# Patient Record
Sex: Male | Born: 1945 | Race: White | Hispanic: No | Marital: Married | Smoking: Never smoker
Health system: Southern US, Community
[De-identification: ages and names within clinical notes are randomized; demographics above are authoritative.]

## PROBLEM LIST (undated history)

## (undated) DIAGNOSIS — E039 Hypothyroidism, unspecified: Secondary | ICD-10-CM

## (undated) DIAGNOSIS — R7303 Prediabetes: Secondary | ICD-10-CM

## (undated) DIAGNOSIS — M549 Dorsalgia, unspecified: Secondary | ICD-10-CM

## (undated) DIAGNOSIS — K5790 Diverticulosis of intestine, part unspecified, without perforation or abscess without bleeding: Secondary | ICD-10-CM

## (undated) DIAGNOSIS — I10 Essential (primary) hypertension: Secondary | ICD-10-CM

## (undated) DIAGNOSIS — I2510 Atherosclerotic heart disease of native coronary artery without angina pectoris: Secondary | ICD-10-CM

## (undated) DIAGNOSIS — I499 Cardiac arrhythmia, unspecified: Secondary | ICD-10-CM

## (undated) DIAGNOSIS — E785 Hyperlipidemia, unspecified: Secondary | ICD-10-CM

## (undated) DIAGNOSIS — Z860100 Personal history of colon polyps, unspecified: Secondary | ICD-10-CM

## (undated) DIAGNOSIS — I77810 Thoracic aortic ectasia: Secondary | ICD-10-CM

## (undated) DIAGNOSIS — M1990 Unspecified osteoarthritis, unspecified site: Secondary | ICD-10-CM

## (undated) DIAGNOSIS — I350 Nonrheumatic aortic (valve) stenosis: Secondary | ICD-10-CM

## (undated) DIAGNOSIS — E079 Disorder of thyroid, unspecified: Secondary | ICD-10-CM

## (undated) DIAGNOSIS — R0600 Dyspnea, unspecified: Secondary | ICD-10-CM

## (undated) DIAGNOSIS — R011 Cardiac murmur, unspecified: Secondary | ICD-10-CM

## (undated) HISTORY — PX: COLONOSCOPY: SHX174

## (undated) HISTORY — DX: Diverticulosis of intestine, part unspecified, without perforation or abscess without bleeding: K57.90

## (undated) HISTORY — DX: Personal history of colonic polyps: Z86.010

## (undated) HISTORY — DX: Nonrheumatic aortic (valve) stenosis: I35.0

## (undated) HISTORY — PX: WISDOM TOOTH EXTRACTION: SHX21

## (undated) HISTORY — DX: Dorsalgia, unspecified: M54.9

## (undated) HISTORY — DX: Disorder of thyroid, unspecified: E07.9

## (undated) HISTORY — PX: LASIK: SHX215

## (undated) HISTORY — DX: Hyperlipidemia, unspecified: E78.5

## (undated) HISTORY — DX: Thoracic aortic ectasia: I77.810

## (undated) HISTORY — DX: Personal history of colon polyps, unspecified: Z86.0100

## (undated) HISTORY — DX: Cardiac arrhythmia, unspecified: I49.9

---

## 1999-01-17 ENCOUNTER — Encounter: Admission: RE | Admit: 1999-01-17 | Discharge: 1999-02-03 | Payer: Self-pay | Admitting: Family Medicine

## 2002-11-25 LAB — FECAL OCCULT BLOOD, GUAIAC: Fecal Occult Blood: NEGATIVE

## 2003-09-28 ENCOUNTER — Encounter: Admission: RE | Admit: 2003-09-28 | Discharge: 2003-09-28 | Payer: Self-pay | Admitting: Family Medicine

## 2005-03-26 ENCOUNTER — Ambulatory Visit: Payer: Self-pay | Admitting: Internal Medicine

## 2005-03-26 ENCOUNTER — Emergency Department (HOSPITAL_COMMUNITY): Admission: EM | Admit: 2005-03-26 | Discharge: 2005-03-26 | Payer: Self-pay | Admitting: Emergency Medicine

## 2005-03-28 ENCOUNTER — Ambulatory Visit: Payer: Self-pay | Admitting: Family Medicine

## 2005-04-04 ENCOUNTER — Ambulatory Visit: Payer: Self-pay | Admitting: Family Medicine

## 2005-04-09 ENCOUNTER — Ambulatory Visit: Payer: Self-pay | Admitting: Family Medicine

## 2005-04-11 ENCOUNTER — Ambulatory Visit: Payer: Self-pay | Admitting: Family Medicine

## 2005-04-16 ENCOUNTER — Ambulatory Visit: Payer: Self-pay | Admitting: Family Medicine

## 2005-11-22 ENCOUNTER — Ambulatory Visit: Payer: Self-pay | Admitting: Family Medicine

## 2005-11-23 ENCOUNTER — Ambulatory Visit: Payer: Self-pay | Admitting: Family Medicine

## 2005-11-23 LAB — CONVERTED CEMR LAB: PSA: 0.49 ng/mL

## 2005-12-19 ENCOUNTER — Ambulatory Visit: Payer: Self-pay | Admitting: Gastroenterology

## 2005-12-26 ENCOUNTER — Ambulatory Visit: Payer: Self-pay | Admitting: Gastroenterology

## 2005-12-26 LAB — HM COLONOSCOPY

## 2006-07-24 ENCOUNTER — Ambulatory Visit: Payer: Self-pay | Admitting: Family Medicine

## 2006-09-03 ENCOUNTER — Encounter: Admission: RE | Admit: 2006-09-03 | Discharge: 2006-09-03 | Payer: Self-pay | Admitting: Family Medicine

## 2006-10-02 ENCOUNTER — Ambulatory Visit: Payer: Self-pay | Admitting: Family Medicine

## 2007-01-07 ENCOUNTER — Ambulatory Visit: Payer: Self-pay | Admitting: Family Medicine

## 2007-01-07 LAB — CONVERTED CEMR LAB
ALT: 44 units/L — ABNORMAL HIGH (ref 0–40)
AST: 37 units/L (ref 0–37)
Cholesterol: 207 mg/dL (ref 0–200)
Direct LDL: 140 mg/dL
HDL: 49.7 mg/dL (ref >39.0)
Total CHOL/HDL Ratio: 4.2
Triglycerides: 91 mg/dL (ref 0–149)
VLDL: 18 mg/dL (ref 0–40)

## 2007-03-11 ENCOUNTER — Telehealth (INDEPENDENT_AMBULATORY_CARE_PROVIDER_SITE_OTHER): Payer: Self-pay | Admitting: *Deleted

## 2007-11-24 ENCOUNTER — Encounter (INDEPENDENT_AMBULATORY_CARE_PROVIDER_SITE_OTHER): Payer: Self-pay | Admitting: *Deleted

## 2008-01-07 ENCOUNTER — Ambulatory Visit: Payer: Self-pay | Admitting: Family Medicine

## 2008-01-07 DIAGNOSIS — R7303 Prediabetes: Secondary | ICD-10-CM | POA: Insufficient documentation

## 2008-01-13 ENCOUNTER — Encounter: Payer: Self-pay | Admitting: Family Medicine

## 2008-01-22 ENCOUNTER — Ambulatory Visit: Payer: Self-pay | Admitting: Family Medicine

## 2008-01-28 LAB — CONVERTED CEMR LAB
ALT: 31 units/L (ref 0–53)
AST: 31 units/L (ref 0–37)
Cholesterol: 249 mg/dL (ref 0–200)
Direct LDL: 192.1 mg/dL
HDL: 53.4 mg/dL (ref >39.0)
Total CHOL/HDL Ratio: 4.7
Triglycerides: 63 mg/dL (ref 0–149)
VLDL: 13 mg/dL (ref 0–40)

## 2008-03-31 ENCOUNTER — Ambulatory Visit: Payer: Self-pay | Admitting: Family Medicine

## 2008-04-02 LAB — CONVERTED CEMR LAB
ALT: 32 units/L (ref 0–53)
AST: 30 units/L (ref 0–37)
Cholesterol: 226 mg/dL (ref 0–200)
Direct LDL: 163.7 mg/dL
HDL: 46.5 mg/dL (ref >39.0)
Total CHOL/HDL Ratio: 4.9
Triglycerides: 78 mg/dL (ref 0–149)
VLDL: 16 mg/dL (ref 0–40)

## 2008-09-09 ENCOUNTER — Telehealth: Payer: Self-pay | Admitting: Family Medicine

## 2008-11-17 ENCOUNTER — Ambulatory Visit: Payer: Self-pay | Admitting: Family Medicine

## 2008-11-18 LAB — CONVERTED CEMR LAB
ALT: 48 units/L (ref 0–53)
AST: 45 units/L — ABNORMAL HIGH (ref 0–37)
Albumin: 4.5 g/dL (ref 3.5–5.2)
Alkaline Phosphatase: 56 units/L (ref 39–117)
BUN: 12 mg/dL (ref 6–23)
Basophils Absolute: 0 10*3/uL (ref 0.0–0.1)
Basophils Relative: 0.9 % (ref 0.0–3.0)
Bilirubin, Direct: 0.1 mg/dL (ref 0.0–0.3)
CO2: 30 meq/L (ref 19–32)
Calcium: 9.3 mg/dL (ref 8.4–10.5)
Chloride: 108 meq/L (ref 96–112)
Cholesterol: 231 mg/dL (ref 0–200)
Creatinine, Ser: 0.9 mg/dL (ref 0.4–1.5)
Direct LDL: 162.4 mg/dL
Eosinophils Absolute: 0.2 10*3/uL (ref 0.0–0.7)
Eosinophils Relative: 4.5 % (ref 0.0–5.0)
GFR calc Af Amer: 111 mL/min
GFR calc non Af Amer: 91 mL/min
Glucose, Bld: 108 mg/dL — ABNORMAL HIGH (ref 70–99)
HCT: 42 % (ref 39.0–52.0)
HDL: 61.2 mg/dL (ref >39.0)
Hemoglobin: 14.7 g/dL (ref 13.0–17.0)
Lymphocytes Relative: 39 % (ref 12.0–46.0)
MCHC: 34.9 g/dL (ref 30.0–36.0)
MCV: 93.3 fL (ref 78.0–100.0)
Monocytes Absolute: 0.7 10*3/uL (ref 0.1–1.0)
Monocytes Relative: 15 % — ABNORMAL HIGH (ref 3.0–12.0)
Neutro Abs: 2.1 10*3/uL (ref 1.4–7.7)
Neutrophils Relative %: 40.6 % — ABNORMAL LOW (ref 43.0–77.0)
PSA: 0.56 ng/mL (ref 0.10–4.00)
Platelets: 131 10*3/uL — ABNORMAL LOW (ref 150–400)
Potassium: 4.5 meq/L (ref 3.5–5.1)
RBC: 4.5 M/uL (ref 4.22–5.81)
RDW: 12.4 % (ref 11.5–14.6)
Sodium: 141 meq/L (ref 135–145)
TSH: 2.72 microintl units/mL (ref 0.35–5.50)
Total Bilirubin: 1.3 mg/dL — ABNORMAL HIGH (ref 0.3–1.2)
Total CHOL/HDL Ratio: 3.8
Total Protein: 7.4 g/dL (ref 6.0–8.3)
Triglycerides: 66 mg/dL (ref 0–149)
VLDL: 13 mg/dL (ref 0–40)
WBC: 4.9 10*3/uL (ref 4.5–10.5)

## 2009-02-15 ENCOUNTER — Ambulatory Visit: Payer: Self-pay | Admitting: Family Medicine

## 2009-02-15 DIAGNOSIS — D696 Thrombocytopenia, unspecified: Secondary | ICD-10-CM | POA: Insufficient documentation

## 2009-02-16 ENCOUNTER — Telehealth: Payer: Self-pay | Admitting: Family Medicine

## 2009-03-24 ENCOUNTER — Encounter (INDEPENDENT_AMBULATORY_CARE_PROVIDER_SITE_OTHER): Payer: Self-pay | Admitting: *Deleted

## 2009-06-30 ENCOUNTER — Telehealth (INDEPENDENT_AMBULATORY_CARE_PROVIDER_SITE_OTHER): Payer: Self-pay | Admitting: *Deleted

## 2009-08-16 ENCOUNTER — Ambulatory Visit: Payer: Self-pay | Admitting: Family Medicine

## 2009-08-16 DIAGNOSIS — I10 Essential (primary) hypertension: Secondary | ICD-10-CM | POA: Insufficient documentation

## 2009-08-17 LAB — CONVERTED CEMR LAB
ALT: 22 units/L (ref 0–53)
AST: 22 units/L (ref 0–37)
Basophils Absolute: 0 10*3/uL (ref 0.0–0.1)
Basophils Relative: 0.6 % (ref 0.0–3.0)
Cholesterol: 256 mg/dL — ABNORMAL HIGH (ref 0–200)
Direct LDL: 194.9 mg/dL
Eosinophils Absolute: 0.2 10*3/uL (ref 0.0–0.7)
Eosinophils Relative: 4.5 % (ref 0.0–5.0)
Glucose, Bld: 115 mg/dL — ABNORMAL HIGH (ref 70–99)
HCT: 42.3 % (ref 39.0–52.0)
HDL: 51.2 mg/dL (ref >39.00)
Hemoglobin: 14.3 g/dL (ref 13.0–17.0)
Lymphocytes Relative: 40.5 % (ref 12.0–46.0)
Lymphs Abs: 2 10*3/uL (ref 0.7–4.0)
MCHC: 33.9 g/dL (ref 30.0–36.0)
MCV: 93.1 fL (ref 78.0–100.0)
Monocytes Absolute: 0.4 10*3/uL (ref 0.1–1.0)
Monocytes Relative: 8.9 % (ref 3.0–12.0)
Neutro Abs: 2.3 10*3/uL (ref 1.4–7.7)
Neutrophils Relative %: 45.5 % (ref 43.0–77.0)
Platelets: 136 10*3/uL — ABNORMAL LOW (ref 150.0–400.0)
RBC: 4.54 M/uL (ref 4.22–5.81)
RDW: 12.2 % (ref 11.5–14.6)
Total CHOL/HDL Ratio: 5
Triglycerides: 94 mg/dL (ref 0.0–149.0)
VLDL: 18.8 mg/dL (ref 0.0–40.0)
WBC: 4.9 10*3/uL (ref 4.5–10.5)

## 2009-08-19 ENCOUNTER — Ambulatory Visit: Payer: Self-pay | Admitting: Family Medicine

## 2009-10-03 ENCOUNTER — Ambulatory Visit: Payer: Self-pay | Admitting: Family Medicine

## 2009-10-06 LAB — CONVERTED CEMR LAB
ALT: 35 units/L (ref 0–53)
AST: 29 units/L (ref 0–37)
Cholesterol: 213 mg/dL — ABNORMAL HIGH (ref 0–200)
Direct LDL: 144.9 mg/dL
HDL: 55.6 mg/dL (ref >39.00)
Total CHOL/HDL Ratio: 4
Triglycerides: 87 mg/dL (ref 0.0–149.0)
VLDL: 17.4 mg/dL (ref 0.0–40.0)

## 2009-10-10 ENCOUNTER — Ambulatory Visit: Payer: Self-pay | Admitting: Family Medicine

## 2009-10-10 DIAGNOSIS — IMO0002 Reserved for concepts with insufficient information to code with codable children: Secondary | ICD-10-CM | POA: Insufficient documentation

## 2009-11-10 ENCOUNTER — Ambulatory Visit: Payer: Self-pay | Admitting: Family Medicine

## 2009-12-09 ENCOUNTER — Ambulatory Visit: Payer: Self-pay | Admitting: Family Medicine

## 2009-12-09 DIAGNOSIS — H9319 Tinnitus, unspecified ear: Secondary | ICD-10-CM | POA: Insufficient documentation

## 2009-12-13 LAB — CONVERTED CEMR LAB
ALT: 31 units/L (ref 0–53)
AST: 27 units/L (ref 0–37)
Cholesterol: 222 mg/dL — ABNORMAL HIGH (ref 0–200)
Direct LDL: 153.8 mg/dL
HDL: 58.3 mg/dL (ref >39.00)
Total CHOL/HDL Ratio: 4
Triglycerides: 75 mg/dL (ref 0.0–149.0)
VLDL: 15 mg/dL (ref 0.0–40.0)

## 2010-01-03 ENCOUNTER — Telehealth: Payer: Self-pay | Admitting: Family Medicine

## 2010-01-12 ENCOUNTER — Encounter: Payer: Self-pay | Admitting: Family Medicine

## 2010-03-30 ENCOUNTER — Encounter (INDEPENDENT_AMBULATORY_CARE_PROVIDER_SITE_OTHER): Payer: Self-pay | Admitting: *Deleted

## 2010-04-05 ENCOUNTER — Ambulatory Visit: Payer: Self-pay | Admitting: Family Medicine

## 2010-04-05 LAB — CONVERTED CEMR LAB
ALT: 27 units/L (ref 0–53)
AST: 25 units/L (ref 0–37)
Albumin: 4.4 g/dL (ref 3.5–5.2)
BUN: 19 mg/dL (ref 6–23)
CO2: 26 meq/L (ref 19–32)
Calcium: 9 mg/dL (ref 8.4–10.5)
Chloride: 107 meq/L (ref 96–112)
Cholesterol: 203 mg/dL — ABNORMAL HIGH (ref 0–200)
Creatinine, Ser: 0.8 mg/dL (ref 0.4–1.5)
Direct LDL: 139.4 mg/dL
GFR calc non Af Amer: 102.75 mL/min (ref >60)
Glucose, Bld: 107 mg/dL — ABNORMAL HIGH (ref 70–99)
HDL: 59.1 mg/dL (ref >39.00)
Phosphorus: 3.2 mg/dL (ref 2.3–4.6)
Potassium: 4.3 meq/L (ref 3.5–5.1)
Sodium: 141 meq/L (ref 135–145)
Total CHOL/HDL Ratio: 3
Triglycerides: 59 mg/dL (ref 0.0–149.0)
VLDL: 11.8 mg/dL (ref 0.0–40.0)

## 2010-04-12 ENCOUNTER — Ambulatory Visit: Payer: Self-pay | Admitting: Family Medicine

## 2010-07-21 ENCOUNTER — Ambulatory Visit: Payer: Self-pay | Admitting: Family Medicine

## 2010-07-21 DIAGNOSIS — R7309 Other abnormal glucose: Secondary | ICD-10-CM | POA: Insufficient documentation

## 2010-07-28 LAB — CONVERTED CEMR LAB
ALT: 28 units/L (ref 0–53)
AST: 24 units/L (ref 0–37)
Cholesterol: 200 mg/dL (ref 0–200)
HDL: 46.3 mg/dL (ref >39.00)
Hgb A1c MFr Bld: 5.1 % (ref 4.6–6.5)
LDL Cholesterol: 143 mg/dL — ABNORMAL HIGH (ref 0–99)
Total CHOL/HDL Ratio: 4
Triglycerides: 55 mg/dL (ref 0.0–149.0)
VLDL: 11 mg/dL (ref 0.0–40.0)

## 2010-11-28 NOTE — Letter (Signed)
Summary: [name] [facility]  [address] [place], [place] [address]   Phone: [phone]  Fax: [phone]    03/30/2010 MRN: [id number]  [name] [address] [place], [place]  [address]   Dear [name],   Our records indicate that you missed your scheduled appointment with ______Lab_______________ on ____6/1/2011________.  Please contact this office to reschedule your appointment as soon as possible.  It is important that you keep your scheduled appointments with your physician, so we can provide you the best care possible.  Please be advised that there may be a charge for "no show" appointments.    Sincerely,   [place] at [facility]

## 2010-11-28 NOTE — Assessment & Plan Note (Signed)
Summary: 3 WK F/U DLO   Vital Signs:  Patient profile:   65 year old male Height:      68.75 inches Weight:      206.2 pounds BMI:     30.78 Temp:     99.1 degrees F oral Pulse rate:   96 / minute Pulse rhythm:   regular BP sitting:   122 / 82  (left arm) Cuff size:   large  Vitals Entered By: Benny Lennert CMA Duncan Dull) (November 10, 2009 3:26 PM)  History of Present Illness: Chief complaint 2 wk follow up  66 year old male f/u L sided lumbar radiculopathy:  lumbar radiculopathy is essentially resolved. Does have some minimal numbness in the lateral aspect of his lower chimney but no radicular pain.  Aching all over, just getting over a cold. Now havng some aching, but that started. Not coughing really but was last wek. he has a runny nose. No nausea, vomiting, diarrhea. No rashes. He is eating and drinking normally.  Also having some ringing in his ears - has not has ringing in his ears before to much degree.   Takes some glucosamine and some benadryl at night.  review of systems above.  GEN: WDWN, NAD; alert,appropriate and cooperative throughout exam HEENT: Normocephalic and atraumatic. Throat clear, w/o exudate, no LAD, R TM clear, L TM - good landmarks, No fluid present. rhinnorhea. cerumen R. Left frontal and maxillary sinuses: NT Right frontal and maxillary sinuses: NT NECK: No ant or post LAD CV: RRR, No M/G/R PULM: no resp distress, no accessory muscles.  No retractions. no w/c/r ABD: S,NT,ND,+BS, No HSM EXTR: no c/c/e PSYCH: full affect, pleasant, conversant    Allergies: 1)  ! * Vytorin   Impression & Recommendations:  Problem # 1:  BACK PAIN, LUMBAR, WITH RADICULOPATHY (ICD-724.4) result been doing better. Follow up only as needed. Suspect probable discogenic origin.  The following medications were removed from the medication list:    Tramadol Hcl 50 Mg Tabs (Tramadol hcl) .Marland Kitchen... 1 by mouth 4 times daily as needed pain  Problem # 2:  VIRAL  INFECTION-UNSPEC (ICD-079.99) supportive care consistent with viral syndrome.  Complete Medication List: 1)  Caduet 5-20 Mg Tabs (Amlodipine-atorvastatin) .Marland Kitchen.. 1 by mouth once daily  Current Allergies (reviewed today): ! * VYTORIN

## 2010-11-28 NOTE — Progress Notes (Signed)
Summary: ears ringing  Phone Note Call from Patient   Caller: Patient Call For: Judith Part MD Summary of Call: Patient says that he is still having trouble with the ringing in his ears even after using the flonase. Patient is requesting referral to ENT.  Initial call taken by: Melody Comas,  January 03, 2010 3:48 PM  Follow-up for Phone Call        will go ahead and send ref to [facility]- thanks Follow-up by: Judith Part MD,  January 03, 2010 4:40 PM  Additional Follow-up for Phone Call Additional follow up Details #1::        Appt made with Dr Jenne Pane on 03/17?2011 at 3:00pm. Additional Follow-up by: Carlton Adam,  January 03, 2010 5:00 PM

## 2010-11-28 NOTE — Consult Note (Signed)
Summary: [facility]  [facility]   Imported By: Maryln Gottron 01/17/2010 13:25:58  _____________________________________________________________________  External Attachment:    Type:   Image     Comment:   External Document
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
## 2010-11-30 ENCOUNTER — Encounter: Payer: Self-pay | Admitting: Family Medicine

## 2010-11-30 ENCOUNTER — Ambulatory Visit (INDEPENDENT_AMBULATORY_CARE_PROVIDER_SITE_OTHER): Payer: Commercial Managed Care - PPO | Admitting: Family Medicine

## 2010-11-30 DIAGNOSIS — S0500XA Injury of conjunctiva and corneal abrasion without foreign body, unspecified eye, initial encounter: Secondary | ICD-10-CM | POA: Insufficient documentation
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
## 2011-02-13 ENCOUNTER — Encounter: Payer: Self-pay | Admitting: Family Medicine

## 2011-02-14 ENCOUNTER — Encounter: Payer: Self-pay | Admitting: Family Medicine

## 2011-02-14 ENCOUNTER — Ambulatory Visit (INDEPENDENT_AMBULATORY_CARE_PROVIDER_SITE_OTHER): Payer: Commercial Managed Care - PPO | Admitting: Family Medicine

## 2011-02-14 VITALS — BP 130/74 | HR 80 | Temp 98.6°F | Ht 69.5 in | Wt 203.0 lb

## 2011-02-14 DIAGNOSIS — M1710 Unilateral primary osteoarthritis, unspecified knee: Secondary | ICD-10-CM

## 2011-02-14 DIAGNOSIS — IMO0002 Reserved for concepts with insufficient information to code with codable children: Secondary | ICD-10-CM

## 2011-02-14 NOTE — Patient Instructions (Signed)
OSTEOARTHRITIS:  For symptomatic relief: Tylenol: 2 tablets up to 3-4 times a day Regular NSAIDS are helpful (avoid in kidney disease and ulcers) Topical Capzaicin Cream, as needed (wear glove to put on)  For flares, corticosteroid injections help. Hyaluronic Acid injections have good success, average relief is 6 months  Glucosamine and Chondroitin often helpful - will take about 3 months to see if you have an effect. If you do, great, keep them up, if none at that point, no need to take in the future.  Omega-3 fish oils may help, 2 grams daily Ice joints on bad days, 20 min, 2-3 x / day REGULAR EXERCISE: swimming, Yoga, Tai Chi, bicycle (NON-IMPACT activity)  

## 2011-02-14 NOTE — Progress Notes (Signed)
65 year old male:  No incident Back flared up for a couple of days a few weeks ago.   Now, mostly has gone away.   L knee.  Out of the blue, last Thursday.   Patient presents with 6 day h/o L sided knee pain after no occult injury. No audible pop was heard. The patient has had an effusion. No symptomatic giving-way. No mechanical clicking. Joint has not locked up. Patient has been able to walk but is limping. The patient does not have pain going up and down stairs or rising from a seated position.   Pain location: diffuse Current physical activity: hiking, walking Prior Knee Surgery: none Current pain meds: Alleve Bracing: none  The PMH, PSH, Social History, Family History, Medications, and allergies have been reviewed in [facility], and have been updated if relevant.  GEN: Well-developed,well-nourished,in no acute distress; alert,appropriate and cooperative throughout examination HEENT: Normocephalic and atraumatic without obvious abnormalities. Ears, externally no deformities PULM: Breathing comfortably in no respiratory distress EXT: No clubbing, cyanosis, or edema PSYCH: Normally interactive. Cooperative during the interview. Pleasant. Friendly and conversant. Not anxious or depressed appearing. Normal, full affect.  Gait: Normal heel toe pattern ROM: WNL Effusion: neg Echymosis or edema: none Patellar tendon NT Painful PLICA: neg Patellar grind: negative Medial and lateral patellar facet loading: negative medial and lateral joint lines:NT Mcmurray's neg Flexion-pinch neg Varus and valgus stress: stable Lachman: neg Ant and Post drawer: neg Hip abduction, IR, ER: WNL Hip flexion str: 5/5 Hip abd: 5/5 Quad: 5/5 VMO atrophy:No Hamstring concentric and eccentric: 5/5  A/P: Knee Pain OA mild flare, resolving Refer to the patient instructions sections for details of plan shared with patient.

## 2011-03-16 NOTE — Consult Note (Signed)
[name], [name]            ACCOUNT NO.:  [id number]   MEDICAL RECORD NO.:  [id number]          PATIENT TYPE:  EMS   LOCATION:  [facility]                         FACILITY:  [facility]   PHYSICIAN:  Rosalyn Gess. Norins, M.D. [facility] OF BIRTH:  [birth year]   DATE OF CONSULTATION:  03/26/2005  DATE OF DISCHARGE:                                   CONSULTATION   REASON FOR CONSULTATION:  I was asked by Dr. Read Drivers to see and evaluate Mr.  Jaquith for a cellulitis in his right lower extremity.   HISTORY OF PRESENT ILLNESS:  The patient reports being hit on the shin by a  baseball about 1 month ago.  Over the past week or so, he has developed  redness and warmth to the distal lower extremity with some discomfort  running from his ankle to just below the knee.  He was seen at urgent care  and was started on doxycycline several days ago.  For lack of resolution or  improvement, he presents to the emergency department for evaluation.   PAST MEDICAL HISTORY:   SURGICAL:  None.   MEDICAL:  1.  Usual childhood diseases, except mumps.  2.  He has back problems, but has never been hospitalized, never had      surgery.  3.  He is followed for hyperlipidemia.  4.  The patient had an episode of cellulitis in the same leg about 4 years      ago.   CURRENT MEDICATIONS:  __________ 10 mg daily.  No other medications.   HABITS:  Tobacco - none.  Alcohol - 1-2 ounces daily.   ALLERGIES:  The patient has no known drug allergies.   PHYSICAL EXAMINATION:  VITAL SIGNS:  Temperature 99.3, blood pressure  161/88, pulse 94, respirations 22.  GENERAL:  This is a well-nourished, well-developed, white male in no acute  distress.  EXTREMITIES:  The right lower extremity was examined.  He was found to have  a diffuse erythema from the ankle to one inch below the knee with a raised  area about 4 cm in diameter across the chin.  The area is warm to the touch.  There is no linear streaking.  There is no  lymphadenopathy in the popliteal  or inguinal regions.  Careful examination between the toes revealed no break  in the skin or site or portal of entry.   LABORATORY DATA:  Hemoglobin 13.1 gm.  White count was 8600 with 62% segs,  22% lymphs, 13% monos.  Platelet count 140,000.  Chemistries were  unremarkable with a potassium of 4.7, sugar of 91, creatinine 1.3.   ASSESSMENT AND PLAN:  Cellulitis.  Question about portal of entry, but the  patient definitely has a moderate cellulitis with no lymphadenopathy or  fever, and a normal white count.  I believe the patient is stable for  outpatient therapy.   PLAN:  1.  Clindamycin 300 mg q.i.d.  2.  The patient is to elevate his leg and to apply a warm compress.  3.  He is to watch for progression, including streaking, fever,  lymphadenopathy, or generalized sense of illness.  Should this develop,      he is instructed to call the [facility] office for an urgent visit,      or, if after hours, to call the on-call physician to report that he is      to be directly admitted.  4.  The patient is to see Dr. __________ for follow up.   This has been discussed with Dr. Read Drivers and with the patient.      MEN/MEDQ  D:  03/26/2005  T:  03/26/2005  Job:  [id number]

## 2011-05-10 ENCOUNTER — Other Ambulatory Visit: Payer: Self-pay | Admitting: *Deleted

## 2011-05-10 MED ORDER — AMLODIPINE-ATORVASTATIN 5-40 MG PO TABS: 1.0000 | ORAL_TABLET | Freq: Every day | ORAL | Status: DC

## 2011-05-15 ENCOUNTER — Other Ambulatory Visit: Payer: Self-pay | Admitting: Family Medicine

## 2011-09-24 ENCOUNTER — Telehealth: Payer: Self-pay | Admitting: Family Medicine

## 2011-09-24 DIAGNOSIS — E785 Hyperlipidemia, unspecified: Secondary | ICD-10-CM

## 2011-09-24 DIAGNOSIS — Z0000 Encounter for general adult medical examination without abnormal findings: Secondary | ICD-10-CM | POA: Insufficient documentation

## 2011-09-24 DIAGNOSIS — I10 Essential (primary) hypertension: Secondary | ICD-10-CM

## 2011-09-24 DIAGNOSIS — R7309 Other abnormal glucose: Secondary | ICD-10-CM

## 2011-09-24 DIAGNOSIS — D696 Thrombocytopenia, unspecified: Secondary | ICD-10-CM

## 2011-09-24 DIAGNOSIS — Z125 Encounter for screening for malignant neoplasm of prostate: Secondary | ICD-10-CM | POA: Insufficient documentation

## 2011-09-24 NOTE — Telephone Encounter (Signed)
Message copied by Judy Pimple on Mon Sep 24, 2011  6:31 PM ------      Message from: Alvina Chou      Created: Tue Sep 18, 2011  3:35 PM      Regarding: Labs, Tuesday 11.27       Patient is scheduled for CPX labs, please order future labs, Thanks , Camelia Eng

## 2011-09-25 ENCOUNTER — Other Ambulatory Visit: Payer: Commercial Managed Care - PPO

## 2011-10-01 ENCOUNTER — Encounter: Payer: Self-pay | Admitting: Family Medicine

## 2011-10-01 ENCOUNTER — Ambulatory Visit (INDEPENDENT_AMBULATORY_CARE_PROVIDER_SITE_OTHER): Payer: Commercial Managed Care - PPO | Admitting: Family Medicine

## 2011-10-01 VITALS — BP 132/76 | HR 64 | Temp 97.9°F | Ht 68.5 in | Wt 194.2 lb

## 2011-10-01 DIAGNOSIS — I10 Essential (primary) hypertension: Secondary | ICD-10-CM

## 2011-10-01 DIAGNOSIS — D696 Thrombocytopenia, unspecified: Secondary | ICD-10-CM

## 2011-10-01 DIAGNOSIS — R7309 Other abnormal glucose: Secondary | ICD-10-CM

## 2011-10-01 DIAGNOSIS — E785 Hyperlipidemia, unspecified: Secondary | ICD-10-CM

## 2011-10-01 DIAGNOSIS — Z0000 Encounter for general adult medical examination without abnormal findings: Secondary | ICD-10-CM

## 2011-10-01 DIAGNOSIS — Z125 Encounter for screening for malignant neoplasm of prostate: Secondary | ICD-10-CM

## 2011-10-01 LAB — COMPREHENSIVE METABOLIC PANEL
ALT: 31 U/L (ref 0–53)
AST: 26 U/L (ref 0–37)
Albumin: 4.7 g/dL (ref 3.5–5.2)
Alkaline Phosphatase: 57 U/L (ref 39–117)
BUN: 18 mg/dL (ref 6–23)
CO2: 26 mEq/L (ref 19–32)
Calcium: 9.2 mg/dL (ref 8.4–10.5)
Chloride: 103 mEq/L (ref 96–112)
Creatinine, Ser: 0.7 mg/dL (ref 0.4–1.5)
GFR: 119.05 mL/min (ref >60.00)
Glucose, Bld: 106 mg/dL — ABNORMAL HIGH (ref 70–99)
Potassium: 4.6 mEq/L (ref 3.5–5.1)
Sodium: 138 mEq/L (ref 135–145)
Total Bilirubin: 1.3 mg/dL — ABNORMAL HIGH (ref 0.3–1.2)
Total Protein: 7.1 g/dL (ref 6.0–8.3)

## 2011-10-01 LAB — CBC WITH DIFFERENTIAL/PLATELET
Basophils Absolute: 0 10*3/uL (ref 0.0–0.1)
Basophils Relative: 0.8 % (ref 0.0–3.0)
Eosinophils Absolute: 0.2 10*3/uL (ref 0.0–0.7)
Eosinophils Relative: 3.6 % (ref 0.0–5.0)
HCT: 40.3 % (ref 39.0–52.0)
Hemoglobin: 13.9 g/dL (ref 13.0–17.0)
Lymphocytes Relative: 37.9 % (ref 12.0–46.0)
Lymphs Abs: 1.9 10*3/uL (ref 0.7–4.0)
MCHC: 34.5 g/dL (ref 30.0–36.0)
MCV: 94.8 fl (ref 78.0–100.0)
Monocytes Absolute: 0.4 10*3/uL (ref 0.1–1.0)
Monocytes Relative: 7.3 % (ref 3.0–12.0)
Neutro Abs: 2.6 10*3/uL (ref 1.4–7.7)
Neutrophils Relative %: 50.4 % (ref 43.0–77.0)
Platelets: 134 10*3/uL — ABNORMAL LOW (ref 150.0–400.0)
RBC: 4.25 Mil/uL (ref 4.22–5.81)
RDW: 13 % (ref 11.5–14.6)
WBC: 5.1 10*3/uL (ref 4.5–10.5)

## 2011-10-01 LAB — TSH: TSH: 2.38 u[IU]/mL (ref 0.35–5.50)

## 2011-10-01 LAB — LIPID PANEL
Cholesterol: 221 mg/dL — ABNORMAL HIGH (ref 0–200)
HDL: 69 mg/dL (ref >39.00)
Total CHOL/HDL Ratio: 3
Triglycerides: 47 mg/dL (ref 0.0–149.0)
VLDL: 9.4 mg/dL (ref 0.0–40.0)

## 2011-10-01 LAB — HEMOGLOBIN A1C: Hgb A1c MFr Bld: 5 % (ref 4.6–6.5)

## 2011-10-01 LAB — PSA: PSA: 0.61 ng/mL (ref 0.10–4.00)

## 2011-10-01 MED ORDER — AMLODIPINE-ATORVASTATIN 5-40 MG PO TABS: 1.0000 | ORAL_TABLET | Freq: Every day | ORAL | Status: DC

## 2011-10-01 NOTE — Assessment & Plan Note (Signed)
Disc goals for lipids and reasons to control them Rev labs with pt from 2011 and hoping for improvement with better habits Labs today and update  Continues to take caduet with no problems  Rev low sat fat diet in detail

## 2011-10-01 NOTE — Assessment & Plan Note (Signed)
Cbc today No symptoms/ bleeding /bruising Lab Results  Component Value Date   WBC 4.9 08/16/2009   HGB 14.3 08/16/2009   HCT 42.3 08/16/2009   MCV 93.1 08/16/2009   PLT 136.0* 08/16/2009   lab today and update

## 2011-10-01 NOTE — Assessment & Plan Note (Signed)
No symptoms, and nl DRE today  psa to follow

## 2011-10-01 NOTE — Assessment & Plan Note (Signed)
Reviewed health habits including diet and exercise and skin cancer prevention Also reviewed health mt list, fam hx and immunizations  Encouraged flu shot Will consider zoster vaccine if affordible Wellness labs today

## 2011-10-01 NOTE — Patient Instructions (Signed)
Labs today  If you are interested in shingles vaccine in future - call your insurance company to see how coverage is and call us to schedule  Keep up the good work with healthy diet and exercise

## 2011-10-01 NOTE — Progress Notes (Signed)
Subjective:    Patient ID: [name], male    DOB: 11/01/1945, 65 y.o.   MRN: [id number]  HPI Here for health maintenance exam and to review chronic medical problems   Zoster status-- may be interested  Flu shot- has not had , has always turned them down in the past  Td 04 Colonoscopy 2/07-- tics (hx of polyps)- will not be due untl 2017 Is feeling great   HTN is controlled  bp is 132/76 Wt is down 9 lb with bmi of 29- has been eating better and getting regular exercise Less fat and grease  Missed lab appt     Chemistry      Component Value Date/Time   NA 141 04/05/2010 0831   K 4.3 04/05/2010 0831   CL 107 04/05/2010 0831   CO2 26 04/05/2010 0831   BUN 19 04/05/2010 0831   CREATININE 0.8 04/05/2010 0831      Component Value Date/Time   CALCIUM 9.0 04/05/2010 0831   ALKPHOS 56 11/17/2008 1051   AST 24 07/21/2010 0918   ALT 28 07/21/2010 0918   BILITOT 1.3* 11/17/2008 1051     Lipids -is on caduet Lab Results  Component Value Date   CHOL 200 07/21/2010   CHOL 203* 04/05/2010   CHOL 222* 12/09/2009   Lab Results  Component Value Date   HDL 46.30 07/21/2010   HDL 40.98 04/05/2010   HDL 58.30 12/09/2009   Lab Results  Component Value Date   LDLCALC 143* 07/21/2010   Lab Results  Component Value Date   TRIG 55.0 07/21/2010   TRIG 59.0 04/05/2010   TRIG 75.0 12/09/2009   Lab Results  Component Value Date   CHOLHDL 4 07/21/2010   CHOLHDL 3 04/05/2010   CHOLHDL 4 12/09/2009   Lab Results  Component Value Date   LDLDIRECT 139.4 04/05/2010   LDLDIRECT 153.8 12/09/2009   LDLDIRECT 144.9 10/03/2009   due for a check of that  Still taking caduet without problems   Due for all wellness labs incl psa  Prostate - no problems in general - some days goes more frequently than others  Nocturia 1 time   Patient Active Problem List  Diagnoses  . HYPERLIPIDEMIA  . THROMBOCYTOPENIA  . TINNITUS  . ESSENTIAL HYPERTENSION, BENIGN  . BACK PAIN, LUMBAR, WITH RADICULOPATHY  . SUPERFICIAL  INJURY OF CONJUNCTIVA  . Osteoarthritis, knee  . Routine general medical examination at a health care facility  . Prostate cancer screening   Past Medical History  Diagnosis Date  . Hyperlipidemia   . Back pain   . Diverticulosis   . History of colon polyps    No past surgical history on file. History  Substance Use Topics  . Smoking status: Never Smoker   . Smokeless tobacco: Not on file  . Alcohol Use: Yes   Family History  Problem Relation Age of Onset  . Cancer Mother     brain tumor  . Emphysema Father   . Diabetes Father   . HIV Brother    Allergies  Allergen Reactions  . Ezetimibe-Simvastatin     REACTION: back pain   Current Outpatient Prescriptions on File Prior to Visit  Medication Sig Dispense Refill  . amLODipine-atorvastatin (CADUET) 5-40 MG per tablet Take 1 tablet by mouth daily.  30 tablet  3  . Doxylamine Succinate, Sleep, (UNISOM PO) Take by mouth as directed.            Review of Systems Review of  Systems  Constitutional: Negative for fever, appetite change, fatigue and unexpected weight change.  Eyes: Negative for pain and visual disturbance.  Respiratory: Negative for cough and shortness of breath.   Cardiovascular: Negative for cp or palpitations    Gastrointestinal: Negative for nausea, diarrhea and constipation.  Genitourinary: Negative for urgency and frequency.  Skin: Negative for pallor or rash   Neurological: Negative for weakness, light-headedness, numbness and headaches.  Hematological: Negative for adenopathy. Does not bruise/bleed easily.  Psychiatric/Behavioral: Negative for dysphoric mood. The patient is not nervous/anxious.          Objective:   Physical Exam  Constitutional: He appears well-developed and well-nourished. No distress.  HENT:  Head: Normocephalic and atraumatic.  Right Ear: External ear normal.  Left Ear: External ear normal.  Nose: Nose normal.  Mouth/Throat: Oropharynx is clear and moist.       Scant  cerumen  Eyes: Conjunctivae and EOM are normal. Pupils are equal, round, and reactive to light. No scleral icterus.  Neck: Normal range of motion. Neck supple. No JVD present. Carotid bruit is not present. No thyromegaly present.  Cardiovascular: Normal rate, regular rhythm and intact distal pulses.  Exam reveals no gallop.   Murmur heard. Pulmonary/Chest: Effort normal and breath sounds normal. No respiratory distress. He has no wheezes. He exhibits no tenderness.  Abdominal: Soft. Bowel sounds are normal. He exhibits no distension and no mass. There is no tenderness.  Genitourinary: Rectum normal and prostate normal. Guaiac negative stool.  Musculoskeletal: Normal range of motion. He exhibits no edema and no tenderness.  Lymphadenopathy:    He has no cervical adenopathy.  Neurological: He is alert. He has normal reflexes. No cranial nerve deficit. He exhibits normal muscle tone. Coordination normal.  Skin: Skin is warm and dry. No rash noted. No erythema. No pallor.  Psychiatric: He has a normal mood and affect.          Assessment & Plan:

## 2011-10-01 NOTE — Assessment & Plan Note (Signed)
bp in fair control at this time  No changes needed  Disc lifstyle change with low sodium diet and exercise   Doing well with caduet Commended on good lifestyle change as well  Lab today

## 2011-10-02 LAB — LDL CHOLESTEROL, DIRECT: Direct LDL: 143.1 mg/dL

## 2012-01-21 ENCOUNTER — Encounter: Payer: Self-pay | Admitting: Family Medicine

## 2012-01-21 ENCOUNTER — Ambulatory Visit (INDEPENDENT_AMBULATORY_CARE_PROVIDER_SITE_OTHER): Payer: Commercial Managed Care - PPO | Admitting: Family Medicine

## 2012-01-21 VITALS — BP 130/82 | HR 69 | Temp 98.0°F | Ht 69.0 in | Wt 204.0 lb

## 2012-01-21 DIAGNOSIS — M75100 Unspecified rotator cuff tear or rupture of unspecified shoulder, not specified as traumatic: Secondary | ICD-10-CM

## 2012-01-21 DIAGNOSIS — M67919 Unspecified disorder of synovium and tendon, unspecified shoulder: Secondary | ICD-10-CM

## 2012-01-21 MED ORDER — DICLOFENAC SODIUM 75 MG PO TBEC: 75.0000 mg | DELAYED_RELEASE_TABLET | Freq: Two times a day (BID) | ORAL | Status: DC

## 2012-01-21 NOTE — Progress Notes (Signed)
  Patient Name: [name] Date of Birth: [birth year] Age: 66 y.o. Medical Record Number: [id number] Gender: male Date of Encounter: 01/21/2012  History of Present Illness:  [name] is a 66 y.o. very pleasant male patient who presents with the following:  The patient noted above presents with shoulder pain that has been ongoing for 2-3 weeks.  there is no history of trauma or accident. The patient denies neck pain or radicular symptoms. Denies dislocation, subluxation, separation of the shoulder. The patient does complain of pain in the overhead plane.  Medications Tried: none Ice or Heat: none Tried PT: No  Prior shoulder Injury: No Prior surgery: No Prior fracture: remote collarbone fx as child   Past Medical History, Surgical History, Social History, Family History, Problem List, Medications, and Allergies have been reviewed and updated if relevant.  Review of Systems:  GEN: No fevers, chills. Nontoxic. Primarily MSK c/o today. MSK: Detailed in the HPI GI: tolerating PO intake without difficulty Neuro: No numbness, parasthesias, or tingling associated. Otherwise the pertinent positives of the ROS are noted above.    Physical Examination: Filed Vitals:   01/21/12 0812  BP: 130/82  Pulse: 69  Temp: 98 F (36.7 C)  TempSrc: Oral  Height: 5\' 9"  (1.753 m)  Weight: 204 lb (92.534 kg)  SpO2: 99%    Body mass index is 30.13 kg/(m^2).   GEN: Well-developed,well-nourished,in no acute distress; alert,appropriate and cooperative throughout examination HEENT: Normocephalic and atraumatic without obvious abnormalities. Ears, externally no deformities PULM: Breathing comfortably in no respiratory distress EXT: No clubbing, cyanosis, or edema PSYCH: Normally interactive. Cooperative during the interview. Pleasant. Friendly and conversant. Not anxious or depressed appearing. Normal, full affect.  Shoulder: R Inspection: No muscle wasting or  winging Ecchymosis/edema: neg  AC joint, scapula, clavicle: NT Cervical spine: NT, full ROM Spurling's: neg Abduction: full, 5/5 Flexion: full, 5/5 IR, full, lift-off: 5/5 ER at neutral: full, 5/5 AC crossover: neg Neer: pos Hawkins: pos Drop Test: neg Empty Can: pos Supraspinatus insertion: mild-mod T Bicipital groove: NT Speed's: neg Yergason's: neg Sulcus sign: neg Scapular dyskinesis: none C5-T1 intact  Neuro: Sensation intact Grip 5/5   Assessment and Plan:  1. Rotator cuff syndrome     Orders Today: No orders of the defined types were placed in this encounter.    Medications Today: Meds ordered this encounter  Medications  . diclofenac (VOLTAREN) 75 MG EC tablet    Sig: Take 1 tablet (75 mg total) by mouth 2 (two) times daily.    Dispense:  60 tablet    Refill:  3    Rotator Cuff Tendinopathy  Shoulder anatomy was reviewed with the patient using and anatomical model.   Rotator cuff strengthening and scapular stabilization exercises were reviewed with the patient.  [place] RTC and scapular stabilization program given to the patient.  Retraining shoulder mechanics and function was emphasized to the patient  For now, his symptoms are mild. We're going to keep him using his arms below his nose and with some high-dose anti-inflammatories over the next week to see if we can calm his cuff down. He will followup in a month if he is not improved

## 2012-10-13 ENCOUNTER — Other Ambulatory Visit: Payer: Self-pay | Admitting: Family Medicine

## 2013-01-04 ENCOUNTER — Telehealth: Payer: Self-pay | Admitting: Family Medicine

## 2013-01-04 DIAGNOSIS — E785 Hyperlipidemia, unspecified: Secondary | ICD-10-CM

## 2013-01-04 DIAGNOSIS — Z0000 Encounter for general adult medical examination without abnormal findings: Secondary | ICD-10-CM

## 2013-01-04 DIAGNOSIS — Z125 Encounter for screening for malignant neoplasm of prostate: Secondary | ICD-10-CM

## 2013-01-04 NOTE — Telephone Encounter (Signed)
Message copied by Judy Pimple on Sun Jan 04, 2013  3:34 PM ------      Message from: Alvina Chou      Created: Tue Dec 30, 2012 11:36 AM      Regarding: Lab orders for Monday 3.10.14       Patient is scheduled for CPX labs, please order future labs, Thanks , Terri       ------

## 2013-01-05 ENCOUNTER — Other Ambulatory Visit: Payer: Commercial Managed Care - PPO

## 2013-01-06 ENCOUNTER — Other Ambulatory Visit (INDEPENDENT_AMBULATORY_CARE_PROVIDER_SITE_OTHER): Payer: Commercial Managed Care - PPO

## 2013-01-06 DIAGNOSIS — E785 Hyperlipidemia, unspecified: Secondary | ICD-10-CM

## 2013-01-06 DIAGNOSIS — Z0000 Encounter for general adult medical examination without abnormal findings: Secondary | ICD-10-CM

## 2013-01-06 DIAGNOSIS — Z125 Encounter for screening for malignant neoplasm of prostate: Secondary | ICD-10-CM

## 2013-01-06 LAB — COMPREHENSIVE METABOLIC PANEL
Albumin: 4.4 g/dL (ref 3.5–5.2)
Alkaline Phosphatase: 53 U/L (ref 39–117)
BUN: 17 mg/dL (ref 6–23)
Glucose, Bld: 110 mg/dL — ABNORMAL HIGH (ref 70–99)
Potassium: 5.2 mEq/L — ABNORMAL HIGH (ref 3.5–5.1)
Total Bilirubin: 1.3 mg/dL — ABNORMAL HIGH (ref 0.3–1.2)

## 2013-01-06 LAB — CBC WITH DIFFERENTIAL/PLATELET
Basophils Relative: 0.9 % (ref 0.0–3.0)
Eosinophils Relative: 5.8 % — ABNORMAL HIGH (ref 0.0–5.0)
HCT: 41.1 % (ref 39.0–52.0)
Hemoglobin: 14.2 g/dL (ref 13.0–17.0)
Lymphs Abs: 2.2 10*3/uL (ref 0.7–4.0)
MCV: 93.1 fl (ref 78.0–100.0)
Monocytes Absolute: 0.5 10*3/uL (ref 0.1–1.0)
RBC: 4.42 Mil/uL (ref 4.22–5.81)
WBC: 4.9 10*3/uL (ref 4.5–10.5)

## 2013-01-06 LAB — LIPID PANEL
Cholesterol: 208 mg/dL — ABNORMAL HIGH (ref 0–200)
Total CHOL/HDL Ratio: 3
Triglycerides: 40 mg/dL (ref 0.0–149.0)

## 2013-01-12 ENCOUNTER — Ambulatory Visit (INDEPENDENT_AMBULATORY_CARE_PROVIDER_SITE_OTHER): Payer: Commercial Managed Care - PPO | Admitting: Family Medicine

## 2013-01-12 ENCOUNTER — Encounter: Payer: Self-pay | Admitting: Family Medicine

## 2013-01-12 VITALS — BP 138/80 | HR 71 | Temp 98.9°F | Ht 69.0 in | Wt 201.0 lb

## 2013-01-12 DIAGNOSIS — E785 Hyperlipidemia, unspecified: Secondary | ICD-10-CM

## 2013-01-12 DIAGNOSIS — Z23 Encounter for immunization: Secondary | ICD-10-CM

## 2013-01-12 DIAGNOSIS — Z0000 Encounter for general adult medical examination without abnormal findings: Secondary | ICD-10-CM

## 2013-01-12 DIAGNOSIS — I10 Essential (primary) hypertension: Secondary | ICD-10-CM

## 2013-01-12 MED ORDER — AMLODIPINE-ATORVASTATIN 5-40 MG PO TABS: 1.0000 | ORAL_TABLET | Freq: Every day | ORAL | Status: DC

## 2013-01-12 NOTE — Assessment & Plan Note (Signed)
Disc goals for lipids and reasons to control them Rev labs with pt Rev low sat fat diet in detail Improved - on caduet and diet Continue to follow

## 2013-01-12 NOTE — Progress Notes (Signed)
Subjective:    Patient ID: [name], male    DOB: [birth year], 67 y.o.   MRN: [id number]  HPI Here for health maintenance exam and to review chronic medical problems    Has been doing fairly well overall    Wt is down 3 lb with bmi of 29 Is eating healthy and exercising - still kayaking   K was a little high at 5.2 Has not been eating a lot of bananas , poss OJ    Bilirubin 1.3-baseline for him ast up a bit to 45- no tylenol  Alcohol - 2 glasses of wine per night - will decrease to 1 per night   On caduet Lab Results  Component Value Date   CHOL 208* 01/06/2013   CHOL 221* 10/01/2011   CHOL 200 07/21/2010   Lab Results  Component Value Date   HDL 70.80 01/06/2013   HDL 40.98 10/01/2011   HDL 11.91 07/21/2010   Lab Results  Component Value Date   LDLCALC 143* 07/21/2010   Lab Results  Component Value Date   TRIG 40.0 01/06/2013   TRIG 47.0 10/01/2011   TRIG 55.0 07/21/2010   Lab Results  Component Value Date   CHOLHDL 3 01/06/2013   CHOLHDL 3 10/01/2011   CHOLHDL 4 07/21/2010   Lab Results  Component Value Date   LDLDIRECT 130.0 01/06/2013   LDLDIRECT 143.1 10/01/2011   LDLDIRECT 139.4 04/05/2010    Is working on that - can still do better with his diet   bp is stable today  No cp or palpitations or headaches or edema  No side effects to medicines  BP Readings from Last 3 Encounters:  01/12/13 146/66  01/21/12 130/82  10/01/11 132/76      Prostate Lab Results  Component Value Date   PSA 0.61 01/06/2013   PSA 0.61 10/01/2011   PSA 0.56 11/17/2008   stable labs  No prostate or urinary problems  Average - nocturia times one   colonosc 2/07- 10 year recall  No problems  Hx of polyps  Declines flu vaccines and zoster vaccine  TD was 04 Will update that today   Mood - is overall good   Patient Active Problem List  Diagnosis  . HYPERLIPIDEMIA  . THROMBOCYTOPENIA  . TINNITUS  . ESSENTIAL HYPERTENSION, BENIGN  . BACK PAIN, LUMBAR, WITH  RADICULOPATHY  . SUPERFICIAL INJURY OF CONJUNCTIVA  . Osteoarthritis, knee  . Routine general medical examination at a health care facility  . Prostate cancer screening   Past Medical History  Diagnosis Date  . Hyperlipidemia   . Back pain   . Diverticulosis   . History of colon polyps    No past surgical history on file. History  Substance Use Topics  . Smoking status: Never Smoker   . Smokeless tobacco: Not on file  . Alcohol Use: Yes     Comment: occ   Family History  Problem Relation Age of Onset  . Cancer Mother     brain tumor  . Emphysema Father   . Diabetes Father   . HIV Brother    Allergies  Allergen Reactions  . Ezetimibe-Simvastatin     REACTION: back pain   Current Outpatient Prescriptions on File Prior to Visit  Medication Sig Dispense Refill  . amLODipine-atorvastatin (CADUET) 5-40 MG per tablet Take 1 tablet by mouth daily.  90 tablet  3  . Doxylamine Succinate, Sleep, (UNISOM PO) Take by mouth as directed.  No current facility-administered medications on file prior to visit.      Review of Systems    Review of Systems  Constitutional: Negative for fever, appetite change, fatigue and unexpected weight change.  Eyes: Negative for pain and visual disturbance.  Respiratory: Negative for cough and shortness of breath.   Cardiovascular: Negative for cp or palpitations    Gastrointestinal: Negative for nausea, diarrhea and constipation.  Genitourinary: Negative for urgency and frequency.  Skin: Negative for pallor or rash   Neurological: Negative for weakness, light-headedness, numbness and headaches.  Hematological: Negative for adenopathy. Does not bruise/bleed easily.  Psychiatric/Behavioral: Negative for dysphoric mood. The patient is not nervous/anxious.      Objective:   Physical Exam  Constitutional: He appears well-developed and well-nourished. No distress.  HENT:  Head: Normocephalic and atraumatic.  Right Ear: External ear  normal.  Left Ear: External ear normal.  Nose: Nose normal.  Mouth/Throat: Oropharynx is clear and moist.  Eyes: Conjunctivae and EOM are normal. Pupils are equal, round, and reactive to light. Right eye exhibits no discharge. Left eye exhibits no discharge. No scleral icterus.  Neck: Normal range of motion. Neck supple. No JVD present. Carotid bruit is not present. No thyromegaly present.  Cardiovascular: Normal rate, regular rhythm and intact distal pulses.  Exam reveals no gallop.   Pulmonary/Chest: Effort normal and breath sounds normal. No respiratory distress. He has no wheezes.  Abdominal: Soft. Bowel sounds are normal. He exhibits no distension, no abdominal bruit and no mass. There is no tenderness.  Genitourinary: Rectum normal and prostate normal. Prostate is not enlarged and not tender.  Musculoskeletal: He exhibits no edema and no tenderness.  Lymphadenopathy:    He has no cervical adenopathy.  Neurological: He is alert. He has normal reflexes. No cranial nerve deficit. He exhibits normal muscle tone. Coordination normal.  Skin: Skin is warm and dry. No rash noted. No erythema. No pallor.  Solar lentigos diffusely   Psychiatric: He has a normal mood and affect.          Assessment & Plan:

## 2013-01-12 NOTE — Patient Instructions (Addendum)
Tdap vaccine today (tetanus) Keep working on healthy diet and exercise  Continue caduet Limit alcohol to one serving daily Here is a copy of your labs

## 2013-01-12 NOTE — Assessment & Plan Note (Signed)
bp in fair control at this time  No changes needed  Disc lifstyle change with low sodium diet and exercise  Labs rev 

## 2013-01-12 NOTE — Assessment & Plan Note (Signed)
Reviewed health habits including diet and exercise and skin cancer prevention Also reviewed health mt list, fam hx and immunizations  Wellness labs reviewed tdap today  DRE and psa ok

## 2013-01-23 ENCOUNTER — Other Ambulatory Visit: Payer: Self-pay | Admitting: Family Medicine

## 2013-10-04 ENCOUNTER — Ambulatory Visit (INDEPENDENT_AMBULATORY_CARE_PROVIDER_SITE_OTHER): Payer: Medicare Other | Admitting: Family Medicine

## 2013-10-04 VITALS — BP 124/58 | HR 78 | Temp 98.5°F | Resp 16 | Ht 68.5 in | Wt 200.2 lb

## 2013-10-04 DIAGNOSIS — R011 Cardiac murmur, unspecified: Secondary | ICD-10-CM

## 2013-10-04 DIAGNOSIS — H109 Unspecified conjunctivitis: Secondary | ICD-10-CM

## 2013-10-04 MED ORDER — CIPROFLOXACIN HCL 0.3 % OP SOLN: 1.0000 [drp] | OPHTHALMIC | Status: DC

## 2013-10-04 NOTE — Patient Instructions (Signed)
Start antibiotic drops in both eyes. See other information as below. Call your doctor to discuss follow up for a heart murmur heard on your exam today. This may have been there before, but recommend evaluation as discussed. Return to the clinic or go to the nearest emergency room if any of your symptoms worsen or new symptoms occur. Conjunctivitis Conjunctivitis is commonly called "pink eye." Conjunctivitis can be caused by bacterial or viral infection, allergies, or injuries. There is usually redness of the lining of the eye, itching, discomfort, and sometimes discharge. There may be deposits of matter along the eyelids. A viral infection usually causes a watery discharge, while a bacterial infection causes a yellowish, thick discharge. Pink eye is very contagious and spreads by direct contact. You may be given antibiotic eyedrops as part of your treatment. Before using your eye medicine, remove all drainage from the eye by washing gently with warm water and cotton balls. Continue to use the medication until you have awakened 2 mornings in a row without discharge from the eye. Do not rub your eye. This increases the irritation and helps spread infection. Use separate towels from other household members. Wash your hands with soap and water before and after touching your eyes. Use cold compresses to reduce pain and sunglasses to relieve irritation from light. Do not wear contact lenses or wear eye makeup until the infection is gone. SEEK MEDICAL CARE IF:   Your symptoms are not better after 3 days of treatment.  You have increased pain or trouble seeing.  The outer eyelids become very red or swollen. Document Released: 11/22/2004 Document Revised: 01/07/2012 Document Reviewed: 10/15/2005 [facility] Patient Information 2014 [place], [place]. Heart Murmur A heart murmur is an extra sound heard by your caregiver when listening to your heart with a device called a stethoscope. The sound might be a "hum" or  "whoosh" sound heard when the heart beats. The sound comes from turbulence when blood flows through the heart. There are two types of heart murmurs:  Innocent murmurs. Most people with this type of heart murmur do not have a heart problem. Many children have innocent heart murmurs. Your caregiver may suggest some basic testing to know whether your murmur is an innocent murmur. If an innocent heart murmur is found, there is no need for further tests or treatment. Also, there is no need to restrict activities or stop playing sports.  Abnormal murmurs. May have signs and symptoms of heart problems. These types of murmurs can occur in children and adults. In children, abnormal heart murmurs are typically caused from heart defects that are present at birth. In adults, abnormal murmurs are usually from heart valve problems caused by disease, infection, or aging. SYMPTOMS   Innocent murmurs do not cause symptoms or require you to limit physical activity.  Many people with abnormal murmurs may or may not have symptoms. If symptoms do develop, they might include:  Shortness of breath.  Blue coloring of the skin, especially on the fingertips.  Chest pain.  Palpitations or feeling a "fluttering" or a "skipped" heartbeat.  Fainting.  Persistent cough.  Getting tired much faster than expected. DIAGNOSIS  A heart murmur might be heard during a sports physical or during any type of examination. When a murmur is heard, it may suggest a possible problem. When this happens, your caregiver may ask you to see a heart specialist (cardiologist). You may also be asked to undergo one or more heart tests. In these cases, testing may vary depending  upon what your caregiver heard. Tests for a heart murmur might include one or more of the following:  Electrocardiography.  Echocardiography.  Cardiac Magnetic Resonance Imaging (MRI). For children and adults who have an abnormal heart murmur and want to play  sports, it is important to complete testing, review test results, and receive recommendations from your caregiver. If heart disease is present, it may be risky to play. Finding out the results of your test Not all test results are available during your visit. If your test results are not back during the visit, make an appointment with your caregiver to find out the results. Do not assume everything is normal if you have not heard from your caregiver or the medical facility. It is important for you to follow up on all of your test results.  TREATMENT  As noted above, innocent murmurs require no treatment or activity restriction. If the murmur represents a problem with the heart, treatment will depend upon the exact nature of the problem. In these cases, medicine or surgery may be needed to treat the problem. HOME CARE INSTRUCTIONS If you want to participate in sports or other types of strenuous physical activity, it is important to discuss this first with your caregiver. If the murmur represents a problem with the heart and you choose to participate in sports, there is a small chance that a serious problem (including sudden death) could result.  SEEK MEDICAL CARE IF:   You feel that your symptoms are slowly worsening.  You develop any new symptoms that cause concern.  You feel that you are having side effects from any medicines prescribed. SEEK IMMEDIATE MEDICAL CARE IF:   Chest pain develops.  You are short of breath.  You notice that your heart beats irregularly often enough to cause you to worry.  You have fainting spells.  There is a worsening of any problems. Document Released: 11/22/2004 Document Revised: 02/09/2013 Document Reviewed: 12/23/2007 [facility] Patient Information 2014 [place], [place].

## 2013-10-04 NOTE — Progress Notes (Addendum)
Subjective:    Patient ID: [name], male    DOB: 04-24-1946, 67 y.o.   MRN: [id number]  This chart was scribed for Luis Staggers, MD by Greggory Stallion, Medical Scribe. This patient's care was started at 9:10 AM.  HPI HPI Comments: [name] is a 67 y.o. male who presents to the office complaining of worsening eye redness that started in the right eye 3 days ago and spread to the left eye today. Denies getting any foreign bodies in his eyes. He states he has discharge throughout the day that causes mild visual disturbance but denies it when there is no discharge. He has used an OTC drop with no relief. Pt had cold symptoms last week but improving with small amt of chest congestion now.  Denies fever, changes in visual field, headache, chest pain, SOB, dizziness, light headedness.    Patient Active Problem List   Diagnosis Date Noted  . Routine general medical examination at a health care facility 09/24/2011  . Prostate cancer screening 09/24/2011  . Osteoarthritis, knee 02/14/2011  . SUPERFICIAL INJURY OF CONJUNCTIVA 11/30/2010  . TINNITUS 12/09/2009  . BACK PAIN, LUMBAR, WITH RADICULOPATHY 10/10/2009  . ESSENTIAL HYPERTENSION, BENIGN 08/16/2009  . THROMBOCYTOPENIA 02/15/2009  . HYPERLIPIDEMIA 01/07/2008   Past Medical History  Diagnosis Date  . Hyperlipidemia   . Back pain   . Diverticulosis   . History of colon polyps    History reviewed. No pertinent past surgical history. Allergies  Allergen Reactions  . Ezetimibe-Simvastatin     REACTION: back pain   Prior to Admission medications   Medication Sig Start Date End Date Taking? Authorizing Provider  amlodipine-atorvastatin (CADUET) 2.5-20 MG per tablet TAKE ONE TABLET BY MOUTH EVERY DAY 01/23/13  Yes Judy Pimple, MD  Doxylamine Succinate, Sleep, (UNISOM PO) Take by mouth as directed.      Historical Provider, MD   History   Social History  . Marital Status: Married    Spouse Name: N/A    Number of  Children: 1  . Years of Education: N/A   Occupational History  .     Social History Main Topics  . Smoking status: Never Smoker   . Smokeless tobacco: Not on file  . Alcohol Use: Yes     Comment: occ  . Drug Use: No  . Sexual Activity: Not on file   Other Topics Concern  . Not on file   Social History Narrative  . No narrative on file    Review of Systems  Constitutional: Negative for fever.  HENT: Negative for congestion and rhinorrhea.   Eyes: Positive for discharge, redness and visual disturbance (with discharge).  Respiratory: Negative for cough and shortness of breath.   Cardiovascular: Negative for chest pain.  Neurological: Negative for dizziness, light-headedness and headaches.       Objective:   Physical Exam  Vitals reviewed. Constitutional: He is oriented to person, place, and time. He appears well-developed and well-nourished. No distress.  HENT:  Head: Normocephalic and atraumatic.  Mouth/Throat: Oropharynx is clear and moist and mucous membranes are normal. No posterior oropharyngeal erythema.  Eyes: EOM are normal. Pupils are equal, round, and reactive to light.  Bilateral crusting along lids with white to yellow exudate on canthi bilaterally. Diffuse scleral injection.   Neck: Neck supple.  No preauricular lymphadenopathy.   Cardiovascular: Normal rate.   Murmur heard. 2/6 systolic murmur at sternal border.  Pulmonary/Chest: Effort normal and breath sounds normal. No respiratory distress. He has  no wheezes. He has no rales.  Musculoskeletal: Normal range of motion.  Lymphadenopathy:    He has no cervical adenopathy.  Neurological: He is alert and oriented to person, place, and time.  Skin: Skin is warm and dry.  Psychiatric: He has a normal mood and affect. His behavior is normal.    Filed Vitals:   10/04/13 0901  BP: 124/58  Pulse: 78  Temp: 98.5 F (36.9 C)  TempSrc: Oral  Resp: 16  Height: 5' 8.5" (1.74 m)  Weight: 200 lb 3.2 oz (90.81  kg)  SpO2: 98%   No exam data present     Assessment & Plan:   [name] is a 67 y.o. male Conjunctivitis - Plan: ciprofloxacin (CILOXAN) 0.3 % ophthalmic solution - appears to be bilateral conjunctivitis- bacterial.  Start ciloxan, contact precautions and rtc precautions.   Heart murmur - asymptomatic. ? AS. Recommended follow up with PCP to discuss and possible echo. rtc precautions.    Meds ordered this encounter  Medications  . ciprofloxacin (CILOXAN) 0.3 % ophthalmic solution    Sig: Place 1 drop into both eyes every 2 (two) hours. Administer 1 drop, every 2 hours, while awake, for 2 days. Then 1 drop, every 4 hours, while awake, for the next 5 days.    Dispense:  10 mL    Refill:  0   Patient Instructions  Start antibiotic drops in both eyes. See other information as below. Call your doctor to discuss follow up for a heart murmur heard on your exam today. This may have been there before, but recommend evaluation as discussed. Return to the clinic or go to the nearest emergency room if any of your symptoms worsen or new symptoms occur. Conjunctivitis Conjunctivitis is commonly called "pink eye." Conjunctivitis can be caused by bacterial or viral infection, allergies, or injuries. There is usually redness of the lining of the eye, itching, discomfort, and sometimes discharge. There may be deposits of matter along the eyelids. A viral infection usually causes a watery discharge, while a bacterial infection causes a yellowish, thick discharge. Pink eye is very contagious and spreads by direct contact. You may be given antibiotic eyedrops as part of your treatment. Before using your eye medicine, remove all drainage from the eye by washing gently with warm water and cotton balls. Continue to use the medication until you have awakened 2 mornings in a row without discharge from the eye. Do not rub your eye. This increases the irritation and helps spread infection. Use separate towels  from other household members. Wash your hands with soap and water before and after touching your eyes. Use cold compresses to reduce pain and sunglasses to relieve irritation from light. Do not wear contact lenses or wear eye makeup until the infection is gone. SEEK MEDICAL CARE IF:   Your symptoms are not better after 3 days of treatment.  You have increased pain or trouble seeing.  The outer eyelids become very red or swollen. Document Released: 11/22/2004 Document Revised: 01/07/2012 Document Reviewed: 10/15/2005 [facility] Patient Information 2014 [place], [place]. Heart Murmur A heart murmur is an extra sound heard by your caregiver when listening to your heart with a device called a stethoscope. The sound might be a "hum" or "whoosh" sound heard when the heart beats. The sound comes from turbulence when blood flows through the heart. There are two types of heart murmurs:  Innocent murmurs. Most people with this type of heart murmur do not have a heart problem. Many  children have innocent heart murmurs. Your caregiver may suggest some basic testing to know whether your murmur is an innocent murmur. If an innocent heart murmur is found, there is no need for further tests or treatment. Also, there is no need to restrict activities or stop playing sports.  Abnormal murmurs. May have signs and symptoms of heart problems. These types of murmurs can occur in children and adults. In children, abnormal heart murmurs are typically caused from heart defects that are present at birth. In adults, abnormal murmurs are usually from heart valve problems caused by disease, infection, or aging. SYMPTOMS   Innocent murmurs do not cause symptoms or require you to limit physical activity.  Many people with abnormal murmurs may or may not have symptoms. If symptoms do develop, they might include:  Shortness of breath.  Blue coloring of the skin, especially on the fingertips.  Chest pain.  Palpitations or  feeling a "fluttering" or a "skipped" heartbeat.  Fainting.  Persistent cough.  Getting tired much faster than expected. DIAGNOSIS  A heart murmur might be heard during a sports physical or during any type of examination. When a murmur is heard, it may suggest a possible problem. When this happens, your caregiver may ask you to see a heart specialist (cardiologist). You may also be asked to undergo one or more heart tests. In these cases, testing may vary depending upon what your caregiver heard. Tests for a heart murmur might include one or more of the following:  Electrocardiography.  Echocardiography.  Cardiac Magnetic Resonance Imaging (MRI). For children and adults who have an abnormal heart murmur and want to play sports, it is important to complete testing, review test results, and receive recommendations from your caregiver. If heart disease is present, it may be risky to play. Finding out the results of your test Not all test results are available during your visit. If your test results are not back during the visit, make an appointment with your caregiver to find out the results. Do not assume everything is normal if you have not heard from your caregiver or the medical facility. It is important for you to follow up on all of your test results.  TREATMENT  As noted above, innocent murmurs require no treatment or activity restriction. If the murmur represents a problem with the heart, treatment will depend upon the exact nature of the problem. In these cases, medicine or surgery may be needed to treat the problem. HOME CARE INSTRUCTIONS If you want to participate in sports or other types of strenuous physical activity, it is important to discuss this first with your caregiver. If the murmur represents a problem with the heart and you choose to participate in sports, there is a small chance that a serious problem (including sudden death) could result.  SEEK MEDICAL CARE IF:   You feel  that your symptoms are slowly worsening.  You develop any new symptoms that cause concern.  You feel that you are having side effects from any medicines prescribed. SEEK IMMEDIATE MEDICAL CARE IF:   Chest pain develops.  You are short of breath.  You notice that your heart beats irregularly often enough to cause you to worry.  You have fainting spells.  There is a worsening of any problems. Document Released: 11/22/2004 Document Revised: 02/09/2013 Document Reviewed: 12/23/2007 [facility] Patient Information 2014 [place], [place].

## 2014-01-06 ENCOUNTER — Telehealth: Payer: Self-pay | Admitting: Family Medicine

## 2014-01-06 ENCOUNTER — Other Ambulatory Visit (INDEPENDENT_AMBULATORY_CARE_PROVIDER_SITE_OTHER): Payer: Medicare Other

## 2014-01-06 DIAGNOSIS — I10 Essential (primary) hypertension: Secondary | ICD-10-CM

## 2014-01-06 DIAGNOSIS — E785 Hyperlipidemia, unspecified: Secondary | ICD-10-CM

## 2014-01-06 DIAGNOSIS — Z125 Encounter for screening for malignant neoplasm of prostate: Secondary | ICD-10-CM

## 2014-01-06 DIAGNOSIS — D696 Thrombocytopenia, unspecified: Secondary | ICD-10-CM

## 2014-01-06 LAB — LIPID PANEL
CHOL/HDL RATIO: 3
Cholesterol: 205 mg/dL — ABNORMAL HIGH (ref 0–200)
HDL: 64.7 mg/dL (ref >39.00)
LDL Cholesterol: 130 mg/dL — ABNORMAL HIGH (ref 0–99)
Triglycerides: 52 mg/dL (ref 0.0–149.0)
VLDL: 10.4 mg/dL (ref 0.0–40.0)

## 2014-01-06 LAB — COMPREHENSIVE METABOLIC PANEL
ALT: 23 U/L (ref 0–53)
AST: 21 U/L (ref 0–37)
Albumin: 4.4 g/dL (ref 3.5–5.2)
Alkaline Phosphatase: 51 U/L (ref 39–117)
BUN: 24 mg/dL — AB (ref 6–23)
CALCIUM: 9.1 mg/dL (ref 8.4–10.5)
CO2: 26 meq/L (ref 19–32)
CREATININE: 0.9 mg/dL (ref 0.4–1.5)
Chloride: 105 mEq/L (ref 96–112)
GFR: 96.03 mL/min (ref >60.00)
Glucose, Bld: 108 mg/dL — ABNORMAL HIGH (ref 70–99)
Potassium: 4.8 mEq/L (ref 3.5–5.1)
Sodium: 137 mEq/L (ref 135–145)
Total Bilirubin: 1.4 mg/dL — ABNORMAL HIGH (ref 0.3–1.2)
Total Protein: 6.7 g/dL (ref 6.0–8.3)

## 2014-01-06 LAB — CBC WITH DIFFERENTIAL/PLATELET
Basophils Absolute: 0 10*3/uL (ref 0.0–0.1)
Basophils Relative: 0.6 % (ref 0.0–3.0)
EOS ABS: 0.3 10*3/uL (ref 0.0–0.7)
EOS PCT: 5.9 % — AB (ref 0.0–5.0)
HCT: 41.2 % (ref 39.0–52.0)
Hemoglobin: 13.9 g/dL (ref 13.0–17.0)
LYMPHS PCT: 41.6 % (ref 12.0–46.0)
Lymphs Abs: 2.1 10*3/uL (ref 0.7–4.0)
MCHC: 33.7 g/dL (ref 30.0–36.0)
MCV: 94.2 fl (ref 78.0–100.0)
MONOS PCT: 8.7 % (ref 3.0–12.0)
Monocytes Absolute: 0.4 10*3/uL (ref 0.1–1.0)
NEUTROS ABS: 2.2 10*3/uL (ref 1.4–7.7)
NEUTROS PCT: 43.2 % (ref 43.0–77.0)
Platelets: 128 10*3/uL — ABNORMAL LOW (ref 150.0–400.0)
RBC: 4.37 Mil/uL (ref 4.22–5.81)
RDW: 13.4 % (ref 11.5–14.6)
WBC: 5.2 10*3/uL (ref 4.5–10.5)

## 2014-01-06 LAB — PSA: PSA: 1.48 ng/mL (ref 0.10–4.00)

## 2014-01-06 LAB — TSH: TSH: 3.23 u[IU]/mL (ref 0.35–5.50)

## 2014-01-06 NOTE — Telephone Encounter (Signed)
Message copied by Abner Greenspan on Wed Jan 06, 2014  6:47 AM ------      Message from: Ellamae Sia      Created: Mon Jan 04, 2014  4:12 PM      Regarding: Lab orders for [name], 3.11.15       Patient is scheduled for CPX labs, please order future labs, Thanks , Terri       ------

## 2014-01-06 NOTE — Telephone Encounter (Signed)
Relevant patient education assigned to patient using Emmi. ° °

## 2014-01-13 ENCOUNTER — Ambulatory Visit (INDEPENDENT_AMBULATORY_CARE_PROVIDER_SITE_OTHER): Payer: Medicare Other | Admitting: Family Medicine

## 2014-01-13 ENCOUNTER — Telehealth: Payer: Self-pay | Admitting: *Deleted

## 2014-01-13 ENCOUNTER — Encounter: Payer: Self-pay | Admitting: Family Medicine

## 2014-01-13 ENCOUNTER — Encounter: Payer: Commercial Managed Care - PPO | Admitting: Family Medicine

## 2014-01-13 VITALS — BP 148/86 | HR 71 | Temp 98.4°F | Ht 68.25 in | Wt 200.5 lb

## 2014-01-13 DIAGNOSIS — D696 Thrombocytopenia, unspecified: Secondary | ICD-10-CM

## 2014-01-13 DIAGNOSIS — Z0000 Encounter for general adult medical examination without abnormal findings: Secondary | ICD-10-CM

## 2014-01-13 DIAGNOSIS — E785 Hyperlipidemia, unspecified: Secondary | ICD-10-CM

## 2014-01-13 DIAGNOSIS — R011 Cardiac murmur, unspecified: Secondary | ICD-10-CM | POA: Insufficient documentation

## 2014-01-13 DIAGNOSIS — Z125 Encounter for screening for malignant neoplasm of prostate: Secondary | ICD-10-CM

## 2014-01-13 DIAGNOSIS — R972 Elevated prostate specific antigen [PSA]: Secondary | ICD-10-CM | POA: Insufficient documentation

## 2014-01-13 DIAGNOSIS — I10 Essential (primary) hypertension: Secondary | ICD-10-CM

## 2014-01-13 MED ORDER — AMLODIPINE-ATORVASTATIN 2.5-20 MG PO TABS: ORAL_TABLET | ORAL | Status: DC

## 2014-01-13 MED ORDER — AMLODIPINE-ATORVASTATIN 5-40 MG PO TABS: 1.0000 | ORAL_TABLET | Freq: Every day | ORAL | Status: DC

## 2014-01-13 NOTE — Progress Notes (Signed)
Pre visit review using our clinic review tool, if applicable. No additional management support is needed unless otherwise documented below in the visit note. 

## 2014-01-13 NOTE — Progress Notes (Signed)
Subjective:    Patient ID: [name], male    DOB: 09/10/1946, 68 y.o.   MRN: [id number]  HPI  I have personally reviewed the Medicare Annual Wellness questionnaire and have noted 1. The patient's medical and social history 2. Their use of alcohol, tobacco or illicit drugs 3. Their current medications and supplements 4. The patient's functional ability including ADL's, fall risks, home safety risks and hearing or visual             impairment. 5. Diet and physical activities 6. Evidence for depression or mood disorders  The patients weight, height, BMI have been recorded in the chart and visual acuity is per eye clinic.  I have made referrals, counseling and provided education to the patient based review of the above and I have provided the pt with a written personalized care plan for preventive services.  Is doing very well Retired and traveling a lot - has been all over the place and going to [place] in the summer  Will do a big hike   No new health issues Has pink eye a few months ago that got better  Wt is stable  Getting a lot of exercise Needs to eat a bit better   See scanned forms.  Routine anticipatory guidance given to patient.  See health maintenance. Colon cancer screening colonsc 2/07 - with recall of 10 years this time per pt   Flu vaccine- did not get this season  Tetanus vaccine 3/14  Pneumovax-declines today  Zoster vaccine- declines in the past -wants it now - will call about coverage  Prostate cancer screening Advance directive- he has a living will  Cognitive function addressed- see scanned forms- and if abnormal then additional documentation follows. - no major issues  PMH and SH reviewed  Meds, vitals, and allergies reviewed.   ROS: See HPI.  Otherwise negative.      glucose is 108 He does exercise  Does not eat a lot of sweets -watches that  No symptoms   Bilirubin 1.4- that is baseline for him   Prostate screen Lab Results    Component Value Date   PSA 1.48 01/06/2014   PSA 0.61 01/06/2013   PSA 0.61 10/01/2011   he does not have much prostate trouble Nocturia -once at most  ? If urination has changed - he has not noticed more frequency  No urgency  Flow at times may be a little slower -not much  GF had prostate cancer    Hx of thrombocytopenia mild This is stable with 128 platelet ct No extra bruising or bleeding   Hyperlipidemia Lab Results  Component Value Date   CHOL 205* 01/06/2014   CHOL 208* 01/06/2013   CHOL 221* 10/01/2011   Lab Results  Component Value Date   HDL 64.70 01/06/2014   HDL 70.80 01/06/2013   HDL 69.00 10/01/2011   Lab Results  Component Value Date   LDLCALC 130* 01/06/2014   LDLCALC 143* 07/21/2010   Lab Results  Component Value Date   TRIG 52.0 01/06/2014   TRIG 40.0 01/06/2013   TRIG 47.0 10/01/2011   Lab Results  Component Value Date   CHOLHDL 3 01/06/2014   CHOLHDL 3 01/06/2013   CHOLHDL 3 10/01/2011   Lab Results  Component Value Date   LDLDIRECT 130.0 01/06/2013   LDLDIRECT 143.1 10/01/2011   LDLDIRECT 139.4 04/05/2010   stable to a bit improved  He does eat fairly healthy-but occ not   bp is up  a bit today because he ran out of his medicines -needs refills  No cp or palpitations or headaches or edema  No side effects to medicines  BP Readings from Last 3 Encounters:  01/13/14 148/86  10/04/13 124/58  01/12/13 138/80      Patient Active Problem List   Diagnosis Date Noted  . Routine general medical examination at a health care facility 09/24/2011  . Prostate cancer screening 09/24/2011  . Osteoarthritis, knee 02/14/2011  . SUPERFICIAL INJURY OF CONJUNCTIVA 11/30/2010  . TINNITUS 12/09/2009  . BACK PAIN, LUMBAR, WITH RADICULOPATHY 10/10/2009  . ESSENTIAL HYPERTENSION, BENIGN 08/16/2009  . THROMBOCYTOPENIA 02/15/2009  . HYPERLIPIDEMIA 01/07/2008   Past Medical History  Diagnosis Date  . Hyperlipidemia   . Back pain   . Diverticulosis   . History of  colon polyps    No past surgical history on file. History  Substance Use Topics  . Smoking status: Never Smoker   . Smokeless tobacco: Not on file  . Alcohol Use: Yes     Comment: occ   Family History  Problem Relation Age of Onset  . Cancer Mother     brain tumor  . Emphysema Father   . Diabetes Father   . HIV Brother    Allergies  Allergen Reactions  . Ezetimibe-Simvastatin     REACTION: back pain   Current Outpatient Prescriptions on File Prior to Visit  Medication Sig Dispense Refill  . amlodipine-atorvastatin (CADUET) 2.5-20 MG per tablet TAKE ONE TABLET BY MOUTH EVERY DAY  30 tablet  11   No current facility-administered medications on file prior to visit.    Review of Systems    Review of Systems  Constitutional: Negative for fever, appetite change, fatigue and unexpected weight change.  Eyes: Negative for pain and visual disturbance.  Respiratory: Negative for cough and shortness of breath.   Cardiovascular: Negative for cp or palpitations    Gastrointestinal: Negative for nausea, diarrhea and constipation.  Genitourinary: Negative for urgency and frequency.  Skin: Negative for pallor or rash   Neurological: Negative for weakness, light-headedness, numbness and headaches.  Hematological: Negative for adenopathy. Does not bruise/bleed easily.  Psychiatric/Behavioral: Negative for dysphoric mood. The patient is not nervous/anxious.      Objective:   Physical Exam  Constitutional: He appears well-developed and well-nourished. No distress.  HENT:  Head: Normocephalic and atraumatic.  Right Ear: External ear normal.  Left Ear: External ear normal.  Nose: Nose normal.  Mouth/Throat: Oropharynx is clear and moist.  Eyes: Conjunctivae and EOM are normal. Pupils are equal, round, and reactive to light. Right eye exhibits no discharge. Left eye exhibits no discharge. No scleral icterus.  Neck: Normal range of motion. Neck supple. No JVD present. Carotid bruit is  not present. No thyromegaly present.  Cardiovascular: Normal rate, regular rhythm and intact distal pulses.  Exam reveals no gallop.   Murmur heard. Systolic M heard best at RSB 2/6   Pulmonary/Chest: Effort normal and breath sounds normal. No respiratory distress. He has no wheezes. He exhibits no tenderness.  Abdominal: Soft. Bowel sounds are normal. He exhibits no distension, no abdominal bruit and no mass. There is no tenderness.  Musculoskeletal: He exhibits no edema and no tenderness.  Lymphadenopathy:    He has no cervical adenopathy.  Neurological: He is alert. He has normal reflexes. No cranial nerve deficit. He exhibits normal muscle tone. Coordination normal.  Skin: Skin is warm and dry. No rash noted. No erythema. No pallor.  Psychiatric: He  has a normal mood and affect.          Assessment & Plan:

## 2014-01-13 NOTE — Telephone Encounter (Signed)
Pharmacy called to verify dose of his caduet, Rx Dr. Glori Bickers prescribed today is 2.5-20 mg and in the past pt was taking 5-40mg . They don't make a 2/5-20 mg tab so we need to know if pt was taking a whole tab or a 1/2 tab of his Rx.   Called pt and left voicemail requesting him to call office back so we can see if he is taking whole tab or 1/2 tab and then let pharmacy know

## 2014-01-13 NOTE — Telephone Encounter (Signed)
Pt is still taking whole tab so Rx refilled with correct dose

## 2014-01-13 NOTE — Patient Instructions (Signed)
If you are interested in a shingles/zoster vaccine - call your insurance to check on coverage,( you should not get it within 1 month of other vaccines) , then call us for a prescription  for it to take to a pharmacy that gives the shot , or make a nurse visit to get it here depending on your coverage  Stop up front for a referral to urology to discuss your psa increase from last time  Also for referral for echocardiogram  Neither of these is urgent  Take care of yourself  Keep exercising  Avoid sweets and control startches in diet  No more than 2 alcoholic beverages per day

## 2014-01-15 ENCOUNTER — Encounter: Payer: Commercial Managed Care - PPO | Admitting: Family Medicine

## 2014-01-15 NOTE — Assessment & Plan Note (Signed)
psa is elevated more than a point from last year  No new symptoms Ref to urol for further eval

## 2014-01-15 NOTE — Assessment & Plan Note (Signed)
This is newly detected today No symptoms Ref for echocardiogram  2D

## 2014-01-15 NOTE — Assessment & Plan Note (Signed)
No symptoms Ref to urol to disc

## 2014-01-15 NOTE — Assessment & Plan Note (Signed)
bp in fair control at this time - up a bit today after missing medication  BP Readings from Last 1 Encounters:  01/13/14 148/86   No changes needed Disc lifstyle change with low sodium diet and exercise

## 2014-01-15 NOTE — Assessment & Plan Note (Signed)
Reviewed health habits including diet and exercise and skin cancer prevention Reviewed appropriate screening tests for age  Also reviewed health mt list, fam hx and immunization status , as well as social and family history    Ref to urol for inc psa  Pt will check on cost of zoster vaccine  Disc alcohol intake and need to limit to 2 drinks per day

## 2014-01-15 NOTE — Assessment & Plan Note (Signed)
Disc goals for lipids and reasons to control them Rev labs with pt Rev low sat fat diet in detail   

## 2014-01-15 NOTE — Assessment & Plan Note (Signed)
This is stable Lab Results  Component Value Date   PLT 128.0* 01/06/2014   no bruising or bleeding  Will continue to follow

## 2014-02-03 ENCOUNTER — Other Ambulatory Visit (HOSPITAL_COMMUNITY): Payer: Medicare Other

## 2014-02-03 ENCOUNTER — Ambulatory Visit (HOSPITAL_COMMUNITY): Payer: Medicare Other | Attending: Cardiovascular Disease | Admitting: Radiology

## 2014-02-03 ENCOUNTER — Encounter: Payer: Self-pay | Admitting: Cardiovascular Disease

## 2014-02-03 ENCOUNTER — Other Ambulatory Visit (HOSPITAL_COMMUNITY): Payer: Self-pay | Admitting: Radiology

## 2014-02-03 DIAGNOSIS — R011 Cardiac murmur, unspecified: Secondary | ICD-10-CM

## 2014-02-03 NOTE — Progress Notes (Signed)
Echocardiogram Performed. 

## 2014-11-08 ENCOUNTER — Other Ambulatory Visit: Payer: Self-pay | Admitting: *Deleted

## 2014-11-08 MED ORDER — AMLODIPINE-ATORVASTATIN 5-40 MG PO TABS: 1.0000 | ORAL_TABLET | Freq: Every day | ORAL | Status: DC

## 2014-11-08 NOTE — Telephone Encounter (Signed)
Received faxed refill request from pharmacy. Last office visit 01/13/14, has appointment scheduled 01/18/15. See allergy/contraindication. Is it okay to refill medication?

## 2014-11-08 NOTE — Telephone Encounter (Signed)
Please refill until next appt Thanks  

## 2014-11-23 ENCOUNTER — Telehealth: Payer: Self-pay

## 2014-11-23 NOTE — Telephone Encounter (Signed)
Pt left v/m;amlodipine-atorvastatin is no longer covered by ins. Pt request different substitute med. Left v/m for pt to cb. Pt needs to ck with ins co to see what substitute med is covered by ins. Also what pharmacy does pt want med sent to.

## 2014-11-24 NOTE — Telephone Encounter (Signed)
Called patient. He is going to call his insurance for a list of meds that they would cover. He will call with the info.

## 2014-11-30 MED ORDER — AMLODIPINE BESYLATE 5 MG PO TABS: 5.0000 mg | ORAL_TABLET | Freq: Every day | ORAL | Status: DC

## 2014-11-30 MED ORDER — ATORVASTATIN CALCIUM 40 MG PO TABS: 40.0000 mg | ORAL_TABLET | Freq: Every day | ORAL | Status: DC

## 2014-11-30 NOTE — Telephone Encounter (Addendum)
Pt left v/m requesting amlodipine and atorvastatin be written as separate prescriptions to [facility].pt request cb.

## 2014-11-30 NOTE — Addendum Note (Signed)
Addended by: Loura Pardon A on: 11/30/2014 04:47 PM   Modules accepted: Orders, Medications

## 2014-11-30 NOTE — Telephone Encounter (Signed)
Let him know I sent those

## 2014-11-30 NOTE — Telephone Encounter (Signed)
Left voicemail for patient that rx were sent.

## 2015-01-09 ENCOUNTER — Telehealth: Payer: Self-pay | Admitting: Family Medicine

## 2015-01-09 DIAGNOSIS — Z125 Encounter for screening for malignant neoplasm of prostate: Secondary | ICD-10-CM

## 2015-01-09 DIAGNOSIS — E785 Hyperlipidemia, unspecified: Secondary | ICD-10-CM

## 2015-01-09 DIAGNOSIS — I10 Essential (primary) hypertension: Secondary | ICD-10-CM

## 2015-01-09 DIAGNOSIS — D696 Thrombocytopenia, unspecified: Secondary | ICD-10-CM

## 2015-01-09 NOTE — Telephone Encounter (Signed)
-----   Message from Ellamae Sia sent at 01/05/2015  4:14 PM EST ----- Regarding: Lab orders for Monday, 3.14.16 Patient is scheduled for CPX labs, please order future labs, Thanks , Karna Christmas

## 2015-01-11 ENCOUNTER — Other Ambulatory Visit (INDEPENDENT_AMBULATORY_CARE_PROVIDER_SITE_OTHER): Payer: Medicare Other

## 2015-01-11 DIAGNOSIS — Z125 Encounter for screening for malignant neoplasm of prostate: Secondary | ICD-10-CM

## 2015-01-11 DIAGNOSIS — E785 Hyperlipidemia, unspecified: Secondary | ICD-10-CM

## 2015-01-11 DIAGNOSIS — D696 Thrombocytopenia, unspecified: Secondary | ICD-10-CM

## 2015-01-11 DIAGNOSIS — I10 Essential (primary) hypertension: Secondary | ICD-10-CM

## 2015-01-11 LAB — COMPREHENSIVE METABOLIC PANEL
ALBUMIN: 4.6 g/dL (ref 3.5–5.2)
ALT: 19 U/L (ref 0–53)
AST: 20 U/L (ref 0–37)
Alkaline Phosphatase: 54 U/L (ref 39–117)
BUN: 15 mg/dL (ref 6–23)
CALCIUM: 9.5 mg/dL (ref 8.4–10.5)
CO2: 30 mEq/L (ref 19–32)
Chloride: 103 mEq/L (ref 96–112)
Creatinine, Ser: 0.86 mg/dL (ref 0.40–1.50)
GFR: 94.45 mL/min (ref >60.00)
Glucose, Bld: 110 mg/dL — ABNORMAL HIGH (ref 70–99)
POTASSIUM: 5 meq/L (ref 3.5–5.1)
Sodium: 137 mEq/L (ref 135–145)
Total Bilirubin: 1 mg/dL (ref 0.2–1.2)
Total Protein: 6.6 g/dL (ref 6.0–8.3)

## 2015-01-11 LAB — LIPID PANEL
Cholesterol: 202 mg/dL — ABNORMAL HIGH (ref 0–200)
HDL: 64.7 mg/dL (ref >39.00)
LDL CALC: 125 mg/dL — AB (ref 0–99)
NonHDL: 137.3
Total CHOL/HDL Ratio: 3
Triglycerides: 62 mg/dL (ref 0.0–149.0)
VLDL: 12.4 mg/dL (ref 0.0–40.0)

## 2015-01-11 LAB — CBC WITH DIFFERENTIAL/PLATELET
BASOS PCT: 0.8 % (ref 0.0–3.0)
Basophils Absolute: 0 10*3/uL (ref 0.0–0.1)
EOS PCT: 6.5 % — AB (ref 0.0–5.0)
Eosinophils Absolute: 0.3 10*3/uL (ref 0.0–0.7)
HEMATOCRIT: 41.5 % (ref 39.0–52.0)
HEMOGLOBIN: 14.3 g/dL (ref 13.0–17.0)
LYMPHS PCT: 34.6 % (ref 12.0–46.0)
Lymphs Abs: 1.8 10*3/uL (ref 0.7–4.0)
MCHC: 34.6 g/dL (ref 30.0–36.0)
MCV: 93.6 fl (ref 78.0–100.0)
MONO ABS: 0.5 10*3/uL (ref 0.1–1.0)
Monocytes Relative: 9.5 % (ref 3.0–12.0)
Neutro Abs: 2.5 10*3/uL (ref 1.4–7.7)
Neutrophils Relative %: 48.6 % (ref 43.0–77.0)
Platelets: 131 10*3/uL — ABNORMAL LOW (ref 150.0–400.0)
RBC: 4.43 Mil/uL (ref 4.22–5.81)
RDW: 13.4 % (ref 11.5–15.5)
WBC: 5.2 10*3/uL (ref 4.0–10.5)

## 2015-01-11 LAB — TSH: TSH: 4.62 u[IU]/mL — ABNORMAL HIGH (ref 0.35–4.50)

## 2015-01-11 LAB — PSA: PSA: 1 ng/mL (ref 0.10–4.00)

## 2015-01-18 ENCOUNTER — Encounter: Payer: Self-pay | Admitting: Family Medicine

## 2015-01-18 ENCOUNTER — Ambulatory Visit (INDEPENDENT_AMBULATORY_CARE_PROVIDER_SITE_OTHER): Payer: Medicare Other | Admitting: Family Medicine

## 2015-01-18 VITALS — BP 138/82 | HR 66 | Temp 97.8°F | Ht 68.5 in | Wt 204.1 lb

## 2015-01-18 DIAGNOSIS — D696 Thrombocytopenia, unspecified: Secondary | ICD-10-CM

## 2015-01-18 DIAGNOSIS — Z125 Encounter for screening for malignant neoplasm of prostate: Secondary | ICD-10-CM

## 2015-01-18 DIAGNOSIS — E785 Hyperlipidemia, unspecified: Secondary | ICD-10-CM

## 2015-01-18 DIAGNOSIS — Z23 Encounter for immunization: Secondary | ICD-10-CM

## 2015-01-18 DIAGNOSIS — I10 Essential (primary) hypertension: Secondary | ICD-10-CM | POA: Diagnosis not present

## 2015-01-18 DIAGNOSIS — R7989 Other specified abnormal findings of blood chemistry: Secondary | ICD-10-CM | POA: Insufficient documentation

## 2015-01-18 DIAGNOSIS — Z0000 Encounter for general adult medical examination without abnormal findings: Secondary | ICD-10-CM | POA: Diagnosis not present

## 2015-01-18 MED ORDER — ATORVASTATIN CALCIUM 40 MG PO TABS: 40.0000 mg | ORAL_TABLET | Freq: Every day | ORAL | Status: DC

## 2015-01-18 MED ORDER — AMLODIPINE BESYLATE 5 MG PO TABS: 5.0000 mg | ORAL_TABLET | Freq: Every day | ORAL | Status: DC

## 2015-01-18 NOTE — Progress Notes (Signed)
Pre visit review using our clinic review tool, if applicable. No additional management support is needed unless otherwise documented below in the visit note. 

## 2015-01-18 NOTE — Progress Notes (Signed)
Subjective:    Patient ID: [name], male    DOB: [birth year], 69 y.o.   MRN: [id number]  HPI Here for annual medicare wellness visit as well as chronic/acute medical problems   Wt is up 4 lb   I have personally reviewed the Medicare Annual Wellness questionnaire and have noted 1. The patient's medical and social history 2. Their use of alcohol, tobacco or illicit drugs 3. Their current medications and supplements 4. The patient's functional ability including ADL's, fall risks, home safety risks and hearing or visual             impairment. 5. Diet and physical activities 6. Evidence for depression or mood disorders  The patients weight, height, BMI have been recorded in the chart and visual acuity is per eye clinic.  I have made referrals, counseling and provided education to the patient based review of the above and I have provided the pt with a written personalized care plan for preventive services.  See scanned forms.  Routine anticipatory guidance given to patient.  See health maintenance. Colon cancer screening colonosc 07 - 10year recall  Flu vaccine- did not get a flu shot /declines them  Tetanus vaccine 3/14  Pneumovax - will get prevnar today  Zoster vaccine- cannot afford  Prostate cancer screening  Lab Results  Component Value Date   PSA 1.00 01/11/2015   PSA 1.48 01/06/2014   PSA 0.61 01/06/2013   was re checked by urology 5/15- and it was ok -no concern - nocturia occ /not every night  Advance directive - he needs to work on that -given Camera operator function addressed- see scanned forms- and if abnormal then additional documentation follows.  No concerns with memory   PMH and SH reviewed  Meds, vitals, and allergies reviewed.   ROS: See HPI.  Otherwise negative.    Has gained wt despite working out a lot  Lots of hiking and kayaking However diet is not great - could do better  Feels fine overall    bp is stable today  No cp or  palpitations or headaches or edema  No side effects to medicines  BP Readings from Last 3 Encounters:  01/18/15 138/82  01/13/14 148/86  10/04/13 124/58     Hyperlipidemia Lab Results  Component Value Date   CHOL 202* 01/11/2015   CHOL 205* 01/06/2014   CHOL 208* 01/06/2013   Lab Results  Component Value Date   HDL 64.70 01/11/2015   HDL 64.70 01/06/2014   HDL 70.80 01/06/2013   Lab Results  Component Value Date   LDLCALC 125* 01/11/2015   LDLCALC 130* 01/06/2014   LDLCALC 143* 07/21/2010   Lab Results  Component Value Date   TRIG 62.0 01/11/2015   TRIG 52.0 01/06/2014   TRIG 40.0 01/06/2013   Lab Results  Component Value Date   CHOLHDL 3 01/11/2015   CHOLHDL 3 01/06/2014   CHOLHDL 3 01/06/2013   Lab Results  Component Value Date   LDLDIRECT 130.0 01/06/2013   LDLDIRECT 143.1 10/01/2011   LDLDIRECT 139.4 04/05/2010   a little improved from last year  Diet is not perfect    Lab Results  Component Value Date   TSH 4.62* 01/11/2015   some wt gain  No other symptoms   Lab Results  Component Value Date   WBC 5.2 01/11/2015   HGB 14.3 01/11/2015   HCT 41.5 01/11/2015   MCV 93.6 01/11/2015   PLT 131.0* 01/11/2015  Chemistry      Component Value Date/Time   NA 137 01/11/2015 0740   K 5.0 01/11/2015 0740   CL 103 01/11/2015 0740   CO2 30 01/11/2015 0740   BUN 15 01/11/2015 0740   CREATININE 0.86 01/11/2015 0740      Component Value Date/Time   CALCIUM 9.5 01/11/2015 0740   ALKPHOS 54 01/11/2015 0740   AST 20 01/11/2015 0740   ALT 19 01/11/2015 0740   BILITOT 1.0 01/11/2015 0740       2 servings of alcohol daily at most Usually wine   Patient Active Problem List   Diagnosis Date Noted  . Heart murmur, systolic 81/19/1478  . PSA elevation 01/13/2014  . Encounter for Medicare annual wellness exam 09/24/2011  . Prostate cancer screening 09/24/2011  . Osteoarthritis, knee 02/14/2011  . SUPERFICIAL INJURY OF CONJUNCTIVA 11/30/2010  .  TINNITUS 12/09/2009  . BACK PAIN, LUMBAR, WITH RADICULOPATHY 10/10/2009  . ESSENTIAL HYPERTENSION, BENIGN 08/16/2009  . Thrombocytopenia 02/15/2009  . Hyperlipidemia 01/07/2008   Past Medical History  Diagnosis Date  . Hyperlipidemia   . Back pain   . Diverticulosis   . History of colon polyps    No past surgical history on file. History  Substance Use Topics  . Smoking status: Never Smoker   . Smokeless tobacco: Not on file  . Alcohol Use: Yes     Comment: occ   Family History  Problem Relation Age of Onset  . Cancer Mother     brain tumor  . Emphysema Father   . Diabetes Father   . HIV Brother    Allergies  Allergen Reactions  . Ezetimibe-Simvastatin     REACTION: back pain   Current Outpatient Prescriptions on File Prior to Visit  Medication Sig Dispense Refill  . amLODipine (NORVASC) 5 MG tablet Take 1 tablet (5 mg total) by mouth daily. 30 tablet 3  . atorvastatin (LIPITOR) 40 MG tablet Take 1 tablet (40 mg total) by mouth daily. 30 tablet 3   No current facility-administered medications on file prior to visit.    Review of Systems Review of Systems  Constitutional: Negative for fever, appetite change, fatigue and unexpected weight change.  Eyes: Negative for pain and visual disturbance.  Respiratory: Negative for cough and shortness of breath.   Cardiovascular: Negative for cp or palpitations    Gastrointestinal: Negative for nausea, diarrhea and constipation.  Genitourinary: Negative for urgency and frequency.  Skin: Negative for pallor or rash   Neurological: Negative for weakness, light-headedness, numbness and headaches.  Hematological: Negative for adenopathy. Does not bruise/bleed easily.  Psychiatric/Behavioral: Negative for dysphoric mood. The patient is not nervous/anxious.         Objective:   Physical Exam  Constitutional: He appears well-developed and well-nourished. No distress.  overwt and well app  HENT:  Head: Normocephalic and  atraumatic.  Right Ear: External ear normal.  Left Ear: External ear normal.  Nose: Nose normal.  Mouth/Throat: Oropharynx is clear and moist.  Eyes: Conjunctivae and EOM are normal. Pupils are equal, round, and reactive to light. Right eye exhibits no discharge. Left eye exhibits no discharge. No scleral icterus.  Neck: Normal range of motion. Neck supple. No JVD present. Carotid bruit is not present. No thyromegaly present.  Cardiovascular: Normal rate, regular rhythm, normal heart sounds and intact distal pulses.  Exam reveals no gallop.   Pulmonary/Chest: Effort normal and breath sounds normal. No respiratory distress. He has no wheezes. He exhibits no tenderness.  Abdominal:  Soft. Bowel sounds are normal. He exhibits no distension, no abdominal bruit and no mass. There is no tenderness.  Musculoskeletal: He exhibits no edema or tenderness.  Lymphadenopathy:    He has no cervical adenopathy.  Neurological: He is alert. He has normal reflexes. No cranial nerve deficit. He exhibits normal muscle tone. Coordination normal.  Skin: Skin is warm and dry. No rash noted. No erythema. No pallor.  Psychiatric: He has a normal mood and affect.          Assessment & Plan:   Problem List Items Addressed This Visit      Cardiovascular and Mediastinum   ESSENTIAL HYPERTENSION, BENIGN    bp in fair control at this time  BP Readings from Last 1 Encounters:  01/18/15 138/82   No changes needed Disc lifstyle change with low sodium diet and exercise   Labs reviewed       Relevant Medications   atorvastatin (LIPITOR) tablet   amLODIpine (NORVASC) tablet     Other   Abnormal TSH    Lab Results  Component Value Date   TSH 4.62* 01/11/2015   Mildly high Re check with free T4 in 1-2 mo  No sympotms or change in exam       Relevant Orders   TSH   T4, Free   Encounter for Medicare annual wellness exam - Primary    Reviewed health habits including diet and exercise and skin cancer  prevention Reviewed appropriate screening tests for age  Also reviewed health mt list, fam hx and immunization status , as well as social and family history   See HPI Labs reviewed  prevnar vaccine today Materials given to work on advance directive       Hyperlipidemia    Disc goals for lipids and reasons to control them Rev labs with pt Rev low sat fat diet in detail  Continue statin       Relevant Medications   atorvastatin (LIPITOR) tablet   amLODIpine (NORVASC) tablet   Prostate cancer screening    Lab Results  Component Value Date   PSA 1.00 01/11/2015   PSA 1.48 01/06/2014   PSA 0.61 01/06/2013   overall stable  Had f/u with urology last May -supposed to f/u in a year  No new or increased symptoms       Thrombocytopenia    Lab Results  Component Value Date   PLT 131.0* 01/11/2015   This is stable No c/o bruising or bleeding Will continue to follow        Other Visit Diagnoses    Need for prophylactic vaccination against Streptococcus pneumoniae (pneumococcus)        Relevant Orders    Pneumococcal conjugate vaccine 13-valent IM (Completed)

## 2015-01-18 NOTE — Patient Instructions (Signed)
prevnar vaccine today  Please work on an advance directive - look at the materials I gave you  Your thyroid test is borderline - make a lab appt for 1-2 months to re check this  Keep up the great exercise  Try to eat a little better

## 2015-01-20 NOTE — Assessment & Plan Note (Signed)
Lab Results  Component Value Date   PSA 1.00 01/11/2015   PSA 1.48 01/06/2014   PSA 0.61 01/06/2013   overall stable  Had f/u with urology last May -supposed to f/u in a year  No new or increased symptoms

## 2015-01-20 NOTE — Assessment & Plan Note (Signed)
Lab Results  Component Value Date   PLT 131.0* 01/11/2015   This is stable No c/o bruising or bleeding Will continue to follow

## 2015-01-20 NOTE — Assessment & Plan Note (Signed)
Disc goals for lipids and reasons to control them Rev labs with pt Rev low sat fat diet in detail Continue statin  

## 2015-01-20 NOTE — Assessment & Plan Note (Signed)
bp in fair control at this time  BP Readings from Last 1 Encounters:  01/18/15 138/82   No changes needed Disc lifstyle change with low sodium diet and exercise   Labs reviewed

## 2015-01-20 NOTE — Assessment & Plan Note (Signed)
Lab Results  Component Value Date   TSH 4.62* 01/11/2015   Mildly high Re check with free T4 in 1-2 mo  No sympotms or change in exam

## 2015-01-20 NOTE — Assessment & Plan Note (Signed)
Reviewed health habits including diet and exercise and skin cancer prevention Reviewed appropriate screening tests for age  Also reviewed health mt list, fam hx and immunization status , as well as social and family history   See HPI Labs reviewed  prevnar vaccine today Materials given to work on advance directive

## 2015-02-14 ENCOUNTER — Other Ambulatory Visit (INDEPENDENT_AMBULATORY_CARE_PROVIDER_SITE_OTHER): Payer: Medicare Other

## 2015-02-14 DIAGNOSIS — R7989 Other specified abnormal findings of blood chemistry: Secondary | ICD-10-CM

## 2015-02-14 LAB — TSH: TSH: 4.05 u[IU]/mL (ref 0.35–4.50)

## 2015-02-14 LAB — T4, FREE: Free T4: 0.74 ng/dL (ref 0.60–1.60)

## 2015-02-15 ENCOUNTER — Telehealth: Payer: Self-pay | Admitting: *Deleted

## 2015-02-15 ENCOUNTER — Encounter: Payer: Self-pay | Admitting: *Deleted

## 2015-02-15 NOTE — Telephone Encounter (Signed)
Letter mailed to patient.

## 2015-02-15 NOTE — Telephone Encounter (Signed)
-----   Message from Abner Greenspan, MD sent at 02/14/2015 10:01 PM EDT ----- Thyroid labs are ok

## 2016-01-02 ENCOUNTER — Telehealth: Payer: Self-pay | Admitting: Family Medicine

## 2016-01-02 NOTE — Telephone Encounter (Signed)
LM for pt to schedule AWV with Lesia on 3/24 at 9:45. mn

## 2016-01-04 ENCOUNTER — Encounter: Payer: Self-pay | Admitting: Gastroenterology

## 2016-01-08 ENCOUNTER — Telehealth: Payer: Self-pay | Admitting: Family Medicine

## 2016-01-08 DIAGNOSIS — R7989 Other specified abnormal findings of blood chemistry: Secondary | ICD-10-CM

## 2016-01-08 DIAGNOSIS — E785 Hyperlipidemia, unspecified: Secondary | ICD-10-CM

## 2016-01-08 DIAGNOSIS — R972 Elevated prostate specific antigen [PSA]: Secondary | ICD-10-CM

## 2016-01-08 DIAGNOSIS — I10 Essential (primary) hypertension: Secondary | ICD-10-CM

## 2016-01-08 DIAGNOSIS — Z125 Encounter for screening for malignant neoplasm of prostate: Secondary | ICD-10-CM

## 2016-01-08 NOTE — Telephone Encounter (Signed)
-----   Message from Marchia Bond sent at 01/05/2016  1:18 PM EST ----- Regarding: Cpx labs Fri 3/17, need orders. Thanks! :-) Please order  future cpx labs for pt's upcoming lab appt. Thanks Aniceto Boss

## 2016-01-13 ENCOUNTER — Other Ambulatory Visit (INDEPENDENT_AMBULATORY_CARE_PROVIDER_SITE_OTHER): Payer: Medicare Other

## 2016-01-13 DIAGNOSIS — R972 Elevated prostate specific antigen [PSA]: Secondary | ICD-10-CM | POA: Diagnosis not present

## 2016-01-13 DIAGNOSIS — R7989 Other specified abnormal findings of blood chemistry: Secondary | ICD-10-CM | POA: Diagnosis not present

## 2016-01-13 DIAGNOSIS — E785 Hyperlipidemia, unspecified: Secondary | ICD-10-CM | POA: Diagnosis not present

## 2016-01-13 DIAGNOSIS — I10 Essential (primary) hypertension: Secondary | ICD-10-CM | POA: Diagnosis not present

## 2016-01-13 LAB — CBC WITH DIFFERENTIAL/PLATELET
Basophils Absolute: 0 10*3/uL (ref 0.0–0.1)
Basophils Relative: 0.6 % (ref 0.0–3.0)
Eosinophils Absolute: 0.3 10*3/uL (ref 0.0–0.7)
Eosinophils Relative: 6.4 % — ABNORMAL HIGH (ref 0.0–5.0)
HCT: 39.8 % (ref 39.0–52.0)
Hemoglobin: 13.6 g/dL (ref 13.0–17.0)
Lymphocytes Relative: 36.4 % (ref 12.0–46.0)
Lymphs Abs: 1.8 10*3/uL (ref 0.7–4.0)
MCHC: 34.2 g/dL (ref 30.0–36.0)
MCV: 93.2 fl (ref 78.0–100.0)
MONO ABS: 0.5 10*3/uL (ref 0.1–1.0)
Monocytes Relative: 9.2 % (ref 3.0–12.0)
NEUTROS ABS: 2.4 10*3/uL (ref 1.4–7.7)
NEUTROS PCT: 47.4 % (ref 43.0–77.0)
PLATELETS: 138 10*3/uL — AB (ref 150.0–400.0)
RBC: 4.27 Mil/uL (ref 4.22–5.81)
RDW: 13.5 % (ref 11.5–15.5)
WBC: 5.1 10*3/uL (ref 4.0–10.5)

## 2016-01-13 LAB — COMPREHENSIVE METABOLIC PANEL
ALK PHOS: 55 U/L (ref 39–117)
ALT: 31 U/L (ref 0–53)
AST: 38 U/L — ABNORMAL HIGH (ref 0–37)
Albumin: 4.4 g/dL (ref 3.5–5.2)
BUN: 20 mg/dL (ref 6–23)
CHLORIDE: 105 meq/L (ref 96–112)
CO2: 26 meq/L (ref 19–32)
Calcium: 9.1 mg/dL (ref 8.4–10.5)
Creatinine, Ser: 0.85 mg/dL (ref 0.40–1.50)
GFR: 95.44 mL/min (ref >60.00)
GLUCOSE: 121 mg/dL — AB (ref 70–99)
POTASSIUM: 4.1 meq/L (ref 3.5–5.1)
SODIUM: 139 meq/L (ref 135–145)
TOTAL PROTEIN: 6.6 g/dL (ref 6.0–8.3)
Total Bilirubin: 1 mg/dL (ref 0.2–1.2)

## 2016-01-13 LAB — LIPID PANEL
Cholesterol: 210 mg/dL — ABNORMAL HIGH (ref 0–200)
HDL: 57.7 mg/dL (ref >39.00)
LDL CALC: 142 mg/dL — AB (ref 0–99)
NONHDL: 152.69
Total CHOL/HDL Ratio: 4
Triglycerides: 53 mg/dL (ref 0.0–149.0)
VLDL: 10.6 mg/dL (ref 0.0–40.0)

## 2016-01-13 LAB — PSA: PSA: 0.78 ng/mL (ref 0.10–4.00)

## 2016-01-13 LAB — TSH: TSH: 2.66 u[IU]/mL (ref 0.35–4.50)

## 2016-01-20 ENCOUNTER — Ambulatory Visit (INDEPENDENT_AMBULATORY_CARE_PROVIDER_SITE_OTHER): Payer: Medicare Other | Admitting: Family Medicine

## 2016-01-20 ENCOUNTER — Encounter: Payer: Self-pay | Admitting: Family Medicine

## 2016-01-20 VITALS — BP 136/70 | HR 69 | Temp 98.4°F | Ht 68.5 in | Wt 204.5 lb

## 2016-01-20 DIAGNOSIS — Z23 Encounter for immunization: Secondary | ICD-10-CM | POA: Diagnosis not present

## 2016-01-20 DIAGNOSIS — D696 Thrombocytopenia, unspecified: Secondary | ICD-10-CM

## 2016-01-20 DIAGNOSIS — I10 Essential (primary) hypertension: Secondary | ICD-10-CM

## 2016-01-20 DIAGNOSIS — R739 Hyperglycemia, unspecified: Secondary | ICD-10-CM | POA: Diagnosis not present

## 2016-01-20 DIAGNOSIS — Z125 Encounter for screening for malignant neoplasm of prostate: Secondary | ICD-10-CM

## 2016-01-20 DIAGNOSIS — E785 Hyperlipidemia, unspecified: Secondary | ICD-10-CM

## 2016-01-20 DIAGNOSIS — Z0000 Encounter for general adult medical examination without abnormal findings: Secondary | ICD-10-CM | POA: Diagnosis not present

## 2016-01-20 LAB — HEMOGLOBIN A1C: HEMOGLOBIN A1C: 5.3 % (ref 4.6–6.5)

## 2016-01-20 MED ORDER — AMLODIPINE BESYLATE 5 MG PO TABS: 5.0000 mg | ORAL_TABLET | Freq: Every day | ORAL | Status: DC

## 2016-01-20 MED ORDER — ATORVASTATIN CALCIUM 40 MG PO TABS: 40.0000 mg | ORAL_TABLET | Freq: Every day | ORAL | Status: DC

## 2016-01-20 NOTE — Progress Notes (Signed)
Subjective:    Patient ID: [name], male    DOB: June 10, 1946, 70 y.o.   MRN: [id number]  HPI Here for annual medicare wellness visit as well as chronic/acute medical problems as well as annual preventative exam  I have personally reviewed the Medicare Annual Wellness questionnaire and have noted 1. The patient's medical and social history 2. Their use of alcohol, tobacco or illicit drugs 3. Their current medications and supplements 4. The patient's functional ability including ADL's, fall risks, home safety risks and hearing or visual             impairment. 5. Diet and physical activities 6. Evidence for depression or mood disorders  The patients weight, height, BMI have been recorded in the chart and visual acuity is per eye clinic.  I have made referrals, counseling and provided education to the patient based review of the above and I have provided the pt with a written personalized care plan for preventive services. Reviewed and updated provider list, see scanned forms.  See scanned forms.  Routine anticipatory guidance given to patient.  See health maintenance. Colon cancer screening is due for 10 year colonoscopy -will refer for that  Declines hep C screen  Flu vaccine-declines  Tetanus vaccine 3/14 Pneumovax due for PNA 23 today Zoster vaccine- declined in the past  Prostate cancer screening  Lab Results  Component Value Date   PSA 0.78 01/13/2016   PSA 1.00 01/11/2015   PSA 1.48 01/06/2014   no prostate problems  occ nocturia times one if any  No stream slowing  No prostate cancer in the family    Advance directive- does not have one /no living will or POA Cognitive function addressed- see scanned forms- and if abnormal then additional documentation follows. No concerns about memory   PMH and SH reviewed  Meds, vitals, and allergies reviewed.   ROS: See HPI.  Otherwise negative.    Wt is stable with bmi of 30  One fall this year  Tripped walking  in the woods - was not paying attention Thinks balance is ok   Hearing test/vision test is ok   Feels generally ok  Retired -happy  bp is stable today  No cp or palpitations or headaches or edema  No side effects to medicines  BP Readings from Last 3 Encounters:  01/20/16 136/70  01/18/15 138/82  01/13/14 148/86     Results for orders placed or performed in visit on 01/13/16  CBC with Differential/Platelet  Result Value Ref Range   WBC 5.1 4.0 - 10.5 K/uL   RBC 4.27 4.22 - 5.81 Mil/uL   Hemoglobin 13.6 13.0 - 17.0 g/dL   HCT 39.8 39.0 - 52.0 %   MCV 93.2 78.0 - 100.0 fl   MCHC 34.2 30.0 - 36.0 g/dL   RDW 13.5 11.5 - 15.5 %   Platelets 138.0 (L) 150.0 - 400.0 K/uL   Neutrophils Relative % 47.4 43.0 - 77.0 %   Lymphocytes Relative 36.4 12.0 - 46.0 %   Monocytes Relative 9.2 3.0 - 12.0 %   Eosinophils Relative 6.4 (H) 0.0 - 5.0 %   Basophils Relative 0.6 0.0 - 3.0 %   Neutro Abs 2.4 1.4 - 7.7 K/uL   Lymphs Abs 1.8 0.7 - 4.0 K/uL   Monocytes Absolute 0.5 0.1 - 1.0 K/uL   Eosinophils Absolute 0.3 0.0 - 0.7 K/uL   Basophils Absolute 0.0 0.0 - 0.1 K/uL  Comprehensive metabolic panel  Result Value Ref Range  Sodium 139 135 - 145 mEq/L   Potassium 4.1 3.5 - 5.1 mEq/L   Chloride 105 96 - 112 mEq/L   CO2 26 19 - 32 mEq/L   Glucose, Bld 121 (H) 70 - 99 mg/dL   BUN 20 6 - 23 mg/dL   Creatinine, Ser 0.85 0.40 - 1.50 mg/dL   Total Bilirubin 1.0 0.2 - 1.2 mg/dL   Alkaline Phosphatase 55 39 - 117 U/L   AST 38 (H) 0 - 37 U/L   ALT 31 0 - 53 U/L   Total Protein 6.6 6.0 - 8.3 g/dL   Albumin 4.4 3.5 - 5.2 g/dL   Calcium 9.1 8.4 - 10.5 mg/dL   GFR 95.44 >60.00 mL/min  Lipid panel  Result Value Ref Range   Cholesterol 210 (H) 0 - 200 mg/dL   Triglycerides 53.0 0.0 - 149.0 mg/dL   HDL 57.70 >39.00 mg/dL   VLDL 10.6 0.0 - 40.0 mg/dL   LDL Cholesterol 142 (H) 0 - 99 mg/dL   Total CHOL/HDL Ratio 4    NonHDL 152.69   PSA  Result Value Ref Range   PSA 0.78 0.10 - 4.00 ng/mL  TSH   Result Value Ref Range   TSH 2.66 0.35 - 4.50 uIU/mL     Thrombocytopenia-mild and stable   Glucose is up at 121-not a sweets eater for the most part  Exercises regularly  Father had DM2 in late life   Cholesterol Lab Results  Component Value Date   CHOL 210* 01/13/2016   CHOL 202* 01/11/2015   CHOL 205* 01/06/2014   Lab Results  Component Value Date   HDL 57.70 01/13/2016   HDL 64.70 01/11/2015   HDL 64.70 01/06/2014   Lab Results  Component Value Date   LDLCALC 142* 01/13/2016   LDLCALC 125* 01/11/2015   LDLCALC 130* 01/06/2014   Lab Results  Component Value Date   TRIG 53.0 01/13/2016   TRIG 62.0 01/11/2015   TRIG 52.0 01/06/2014   Lab Results  Component Value Date   CHOLHDL 4 01/13/2016   CHOLHDL 3 01/11/2015   CHOLHDL 3 01/06/2014   Lab Results  Component Value Date   LDLDIRECT 130.0 01/06/2013   LDLDIRECT 143.1 10/01/2011   LDLDIRECT 139.4 04/05/2010   On lipitor and diet   HDL is lower - despite exercising more , eats fish oil  LDL is up - he may have run out of lipitor for a few days - but that was a while ago  He does eat a fair amount of fatty foods / with the exception of fried food    Patient Active Problem List   Diagnosis Date Noted  . Routine general medical examination at a health care facility 01/20/2016  . Abnormal TSH 01/18/2015  . Heart murmur, systolic Q000111Q  . PSA elevation 01/13/2014  . Encounter for Medicare annual wellness exam 09/24/2011  . Prostate cancer screening 09/24/2011  . Osteoarthritis, knee 02/14/2011  . SUPERFICIAL INJURY OF CONJUNCTIVA 11/30/2010  . TINNITUS 12/09/2009  . BACK PAIN, LUMBAR, WITH RADICULOPATHY 10/10/2009  . ESSENTIAL HYPERTENSION, BENIGN 08/16/2009  . Thrombocytopenia ([place]) 02/15/2009  . Hyperlipidemia 01/07/2008   Past Medical History  Diagnosis Date  . Hyperlipidemia   . Back pain   . Diverticulosis   . History of colon polyps    No past surgical history on file. Social History    Substance Use Topics  . Smoking status: Never Smoker   . Smokeless tobacco: None  . Alcohol Use: 0.0 oz/week  0 Standard drinks or equivalent per week     Comment: occ   Family History  Problem Relation Age of Onset  . Cancer Mother     brain tumor  . Emphysema Father   . Diabetes Father   . HIV Brother    Allergies  Allergen Reactions  . Ezetimibe-Simvastatin     REACTION: back pain   Current Outpatient Prescriptions on File Prior to Visit  Medication Sig Dispense Refill  . amLODipine (NORVASC) 5 MG tablet Take 1 tablet (5 mg total) by mouth daily. 30 tablet 11  . atorvastatin (LIPITOR) 40 MG tablet Take 1 tablet (40 mg total) by mouth daily. 30 tablet 11   No current facility-administered medications on file prior to visit.    Review of Systems Review of Systems  Constitutional: Negative for fever, appetite change, fatigue and unexpected weight change.  Eyes: Negative for pain and visual disturbance.  Respiratory: Negative for cough and shortness of breath.   Cardiovascular: Negative for cp or palpitations    Gastrointestinal: Negative for nausea, diarrhea and constipation.  Genitourinary: Negative for urgency and frequency.  Skin: Negative for pallor or rash   Neurological: Negative for weakness, light-headedness, numbness and headaches.  Hematological: Negative for adenopathy. Does not bruise/bleed easily.  Psychiatric/Behavioral: Negative for dysphoric mood. The patient is not nervous/anxious.         Objective:   Physical Exam  Constitutional: He appears well-developed and well-nourished. No distress.  overwt and well appearing   HENT:  Head: Normocephalic and atraumatic.  Right Ear: External ear normal.  Left Ear: External ear normal.  Nose: Nose normal.  Mouth/Throat: Oropharynx is clear and moist.  Eyes: Conjunctivae and EOM are normal. Pupils are equal, round, and reactive to light. Right eye exhibits no discharge. Left eye exhibits no discharge. No  scleral icterus.  Neck: Normal range of motion. Neck supple. No JVD present. Carotid bruit is not present. No thyromegaly present.  Cardiovascular: Normal rate, regular rhythm and intact distal pulses.  Exam reveals no gallop.   Murmur heard. Pulmonary/Chest: Effort normal and breath sounds normal. No respiratory distress. He has no wheezes. He exhibits no tenderness.  Abdominal: Soft. Bowel sounds are normal. He exhibits no distension, no abdominal bruit and no mass. There is no tenderness.  Musculoskeletal: He exhibits no edema or tenderness.  Lymphadenopathy:    He has no cervical adenopathy.  Neurological: He is alert. He has normal reflexes. No cranial nerve deficit. He exhibits normal muscle tone. Coordination normal.  Skin: Skin is warm and dry. No rash noted. No erythema. No pallor.  Psychiatric: He has a normal mood and affect.          Assessment & Plan:   Problem List Items Addressed This Visit      Cardiovascular and Mediastinum   ESSENTIAL HYPERTENSION, BENIGN    bp in fair control at this time  BP Readings from Last 1 Encounters:  01/20/16 136/70   No changes needed Disc lifstyle change with low sodium diet and exercise  Labs reviewed       Relevant Medications   atorvastatin (LIPITOR) 40 MG tablet   amLODipine (NORVASC) 5 MG tablet     Other   Encounter for Medicare annual wellness exam - Primary    Reviewed health habits including diet and exercise and skin cancer prevention Reviewed appropriate screening tests for age  Also reviewed health mt list, fam hx and immunization status , as well as social and family history  Labs rev  See HPI Pneumonia vaccine today  Please work on your living will and power of attorney- the blue booklet I gave you  A1C test today for blood sugar - work on weight control and watch out for sweets/carbs  For cholesterol   (Avoid red meat/ fried foods/ egg yolks/ fatty breakfast meats/ butter, cheese and high fat dairy/ and  shellfish)  Schedule fasting labs for cholesterol in 3 months   (it was higher than last year)       Hyperglycemia    Glucose 121 A1C today  Disc imp of low glycemic diet/exercise and wt control to avoid DM2      Relevant Orders   Hemoglobin A1c (Completed)   Hyperlipidemia    Disc goals for lipids and reasons to control them Rev labs with pt He missed a few doses of statin - LDL is not at goal but imp from previous Rev low sat fat diet in detail Continue atorvastatin       Relevant Medications   atorvastatin (LIPITOR) 40 MG tablet   amLODipine (NORVASC) 5 MG tablet   Prostate cancer screening    Lab Results  Component Value Date   PSA 0.78 01/13/2016   PSA 1.00 01/11/2015   PSA 1.48 01/06/2014         Routine general medical examination at a health care facility    Reviewed health habits including diet and exercise and skin cancer prevention Reviewed appropriate screening tests for age  Also reviewed health mt list, fam hx and immunization status , as well as social and family history   Labs rev  See HPI Pneumonia vaccine today  Please work on your living will and power of attorney- the blue booklet I gave you  A1C test today for blood sugar - work on weight control and watch out for sweets/carbs  For cholesterol   (Avoid red meat/ fried foods/ egg yolks/ fatty breakfast meats/ butter, cheese and high fat dairy/ and shellfish)  Schedule fasting labs for cholesterol in 3 months   (it was higher than last year)       Thrombocytopenia ([place])    This is stable No bleeding or bruising  Mild  Continue to watch       Other Visit Diagnoses    Need for 23-polyvalent pneumococcal polysaccharide vaccine        Relevant Orders    Pneumococcal polysaccharide vaccine 23-valent greater than or equal to 2yo subcutaneous/IM (Completed)

## 2016-01-20 NOTE — Patient Instructions (Signed)
Pneumonia vaccine today  Please work on your living will and power of attorney- the blue booklet I gave you  A1C test today for blood sugar - work on weight control and watch out for sweets/carbs  For cholesterol   (Avoid red meat/ fried foods/ egg yolks/ fatty breakfast meats/ butter, cheese and high fat dairy/ and shellfish)  Schedule fasting labs for cholesterol in 3 months   (it was higher than last year)

## 2016-01-20 NOTE — Progress Notes (Signed)
Pre visit review using our clinic review tool, if applicable. No additional management support is needed unless otherwise documented below in the visit note. 

## 2016-01-22 NOTE — Assessment & Plan Note (Signed)
bp in fair control at this time  BP Readings from Last 1 Encounters:  01/20/16 136/70   No changes needed Disc lifstyle change with low sodium diet and exercise  Labs reviewed

## 2016-01-22 NOTE — Assessment & Plan Note (Signed)
Reviewed health habits including diet and exercise and skin cancer prevention Reviewed appropriate screening tests for age  Also reviewed health mt list, fam hx and immunization status , as well as social and family history   Labs rev  See HPI Pneumonia vaccine today  Please work on your living will and power of attorney- the blue booklet I gave you  A1C test today for blood sugar - work on weight control and watch out for sweets/carbs  For cholesterol   (Avoid red meat/ fried foods/ egg yolks/ fatty breakfast meats/ butter, cheese and high fat dairy/ and shellfish)  Schedule fasting labs for cholesterol in 3 months   (it was higher than last year)

## 2016-01-22 NOTE — Assessment & Plan Note (Signed)
This is stable No bleeding or bruising  Mild  Continue to watch

## 2016-01-22 NOTE — Assessment & Plan Note (Signed)
Glucose 121 A1C today  Disc imp of low glycemic diet/exercise and wt control to avoid DM2

## 2016-01-22 NOTE — Assessment & Plan Note (Signed)
Lab Results  Component Value Date   PSA 0.78 01/13/2016   PSA 1.00 01/11/2015   PSA 1.48 01/06/2014

## 2016-01-22 NOTE — Assessment & Plan Note (Signed)
Disc goals for lipids and reasons to control them Rev labs with pt He missed a few doses of statin - LDL is not at goal but imp from previous Rev low sat fat diet in detail Continue atorvastatin

## 2016-01-23 ENCOUNTER — Encounter: Payer: Self-pay | Admitting: *Deleted

## 2016-04-23 ENCOUNTER — Other Ambulatory Visit (INDEPENDENT_AMBULATORY_CARE_PROVIDER_SITE_OTHER): Payer: Medicare Other

## 2016-04-23 DIAGNOSIS — E785 Hyperlipidemia, unspecified: Secondary | ICD-10-CM

## 2016-04-23 LAB — LIPID PANEL
CHOL/HDL RATIO: 3
Cholesterol: 210 mg/dL — ABNORMAL HIGH (ref 0–200)
HDL: 61.8 mg/dL (ref >39.00)
LDL Cholesterol: 130 mg/dL — ABNORMAL HIGH (ref 0–99)
NonHDL: 147.83
TRIGLYCERIDES: 91 mg/dL (ref 0.0–149.0)
VLDL: 18.2 mg/dL (ref 0.0–40.0)

## 2016-04-24 ENCOUNTER — Encounter: Payer: Self-pay | Admitting: *Deleted

## 2016-10-29 HISTORY — PX: POLYPECTOMY: SHX149

## 2016-10-29 HISTORY — PX: COLONOSCOPY: SHX174

## 2016-12-26 ENCOUNTER — Ambulatory Visit (INDEPENDENT_AMBULATORY_CARE_PROVIDER_SITE_OTHER): Payer: Medicare HMO | Admitting: Family Medicine

## 2016-12-26 ENCOUNTER — Encounter: Payer: Self-pay | Admitting: Family Medicine

## 2016-12-26 ENCOUNTER — Ambulatory Visit (INDEPENDENT_AMBULATORY_CARE_PROVIDER_SITE_OTHER)
Admission: RE | Admit: 2016-12-26 | Discharge: 2016-12-26 | Disposition: A | Discharge status: Home / Self Care | Payer: Medicare HMO | Source: Ambulatory Visit | Attending: Family Medicine | Admitting: Family Medicine

## 2016-12-26 VITALS — BP 128/70 | HR 63 | Temp 98.8°F | Ht 68.5 in | Wt 207.0 lb

## 2016-12-26 DIAGNOSIS — M25561 Pain in right knee: Secondary | ICD-10-CM | POA: Diagnosis not present

## 2016-12-26 DIAGNOSIS — M1711 Unilateral primary osteoarthritis, right knee: Secondary | ICD-10-CM | POA: Diagnosis not present

## 2016-12-26 MED ORDER — MELOXICAM 15 MG PO TABS: 15.0000 mg | ORAL_TABLET | Freq: Every day | ORAL | 1 refills | Status: DC

## 2016-12-26 NOTE — Progress Notes (Signed)
Pre visit review using our clinic review tool, if applicable. No additional management support is needed unless otherwise documented below in the visit note. 

## 2016-12-26 NOTE — Progress Notes (Signed)
Subjective:    Patient ID: [name], male    DOB: [birth year], 71 y.o.   MRN: [id number]  HPI  71 yo M here with knee pain (right)  About a month ago  Stood up from sitting and pivoted on a planted foot  Felt some pain immediately - not enough to stop his that day  Pain has stayed the same  No bruising  Some swelling later that day   Pain is mostly medial  Some swelling her notes above knee   Hyperextension hurts (on top of knee)  Getting up from sitting hurts  Going down hill and down stairs  occ gets a catch with it   Using hot and cold compresses (cold feels better)  At night he takes 2 aleve (does not bother his stomach)   Wears a knee brace - feels more secure - has patellar hole /velcro  Also has an elastic one    Has changed to low impact exercise since hiking and treadmill hurt  Can take short walks Does water exercise (runs in deep water)   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 Knee pain, right 12/26/2016  . Routine general medical examination at a health care facility 01/20/2016  . Hyperglycemia 01/20/2016  . Heart murmur, systolic Q000111Q  . PSA elevation 01/13/2014  . Encounter for Medicare annual wellness exam 09/24/2011  . Prostate cancer screening 09/24/2011  . Osteoarthritis, knee 02/14/2011  . SUPERFICIAL INJURY OF CONJUNCTIVA 11/30/2010  . TINNITUS 12/09/2009  . BACK PAIN, LUMBAR, WITH RADICULOPATHY 10/10/2009  . ESSENTIAL HYPERTENSION, BENIGN 08/16/2009  . Thrombocytopenia ([place]) 02/15/2009  . Hyperlipidemia 01/07/2008   Past Medical History:  Diagnosis Date  . Back pain   . Diverticulosis   . History of colon polyps   . Hyperlipidemia    No past surgical history on file. Social History  Substance Use Topics  . Smoking status: Never  Smoker  . Smokeless tobacco: Never Used  . Alcohol use 0.0 oz/week     Comment: occ   Family History  Problem Relation Age of Onset  . Cancer Mother     brain tumor  . Emphysema Father   . Diabetes Father   . HIV Brother    Allergies  Allergen Reactions  . Ezetimibe-Simvastatin     REACTION: back pain   Current Outpatient Prescriptions on File Prior to Visit  Medication Sig Dispense Refill  . amLODipine (NORVASC) 5 MG tablet Take 1 tablet (5 mg total) by mouth daily. 30 tablet 11  . atorvastatin (LIPITOR) 40 MG tablet Take 1 tablet (40 mg total) by mouth daily. 30 tablet 11   No current facility-administered medications on file prior to visit.     Review of Systems    Review of Systems  Constitutional: Negative for fever, appetite change, fatigue and unexpected weight change.  Eyes: Negative for pain and visual disturbance.  Respiratory: Negative for cough and shortness of breath.   Cardiovascular: Negative for cp or palpitations    Gastrointestinal: Negative for nausea, diarrhea and constipation.  Genitourinary: Negative for urgency and frequency.  Skin: Negative for pallor or rash   MSK pos for knee pai n Neurological: Negative for weakness, light-headedness, numbness and headaches.  Hematological:  Negative for adenopathy. Does not bruise/bleed easily.  Psychiatric/Behavioral: Negative for dysphoric mood. The patient is not nervous/anxious.      Objective:   Physical Exam  Constitutional: He appears well-developed and well-nourished. No distress.  HENT:  Head: Normocephalic.  Neck: Normal range of motion. Neck supple.  Cardiovascular: Normal rate and regular rhythm.   Musculoskeletal: He exhibits edema. He exhibits no tenderness.       Right knee: He exhibits decreased range of motion, swelling and effusion. He exhibits no ecchymosis, no erythema, normal alignment, no LCL laxity, normal patellar mobility, normal meniscus and no MCL laxity. No patellar tendon  tenderness noted.  R knee Effusion noted and mild superior soft tissue swelling Medial tenderness Mild crepitus  Flex to 90 deg with pain  Neg drawer/lachman exam   Neurological: He is alert. He has normal reflexes. No cranial nerve deficit. He exhibits normal muscle tone. Coordination normal.  Skin: Skin is warm and dry. No rash noted. No erythema. No pallor.  Psychiatric: He has a normal mood and affect.          Assessment & Plan:   Problem List Items Addressed This Visit      Other   Knee pain, right    Poss acute on chronic /worse after pivoting  Hx of past OA and former runner  Most pain is medial  Some poss of meniscal injury  Xray now  meloxicam daily with food  Ice at least bid Low impact activity Plan to follow after rad review       Relevant Orders   DG Knee AP/LAT W/[place] Right (Completed)

## 2016-12-26 NOTE — Patient Instructions (Addendum)
Ice/cold compress at least twice daily for 10 minutes and after exercise  Knee brace is ok  Take meloxicam daily with food (instead of aleve) Xray now - will get result to you with a plan

## 2016-12-27 ENCOUNTER — Telehealth: Payer: Self-pay | Admitting: Family Medicine

## 2016-12-27 ENCOUNTER — Encounter: Payer: Self-pay | Admitting: *Deleted

## 2016-12-27 DIAGNOSIS — M1711 Unilateral primary osteoarthritis, right knee: Secondary | ICD-10-CM

## 2016-12-27 NOTE — Telephone Encounter (Signed)
Patient said he is returning telephone call from [place].

## 2016-12-27 NOTE — Assessment & Plan Note (Signed)
Poss acute on chronic /worse after pivoting  Hx of past OA and former runner  Most pain is medial  Some poss of meniscal injury  Xray now  meloxicam daily with food  Ice at least bid Low impact activity Plan to follow after rad review

## 2016-12-27 NOTE — Telephone Encounter (Signed)
Ref done  Will route to [facility] 

## 2016-12-31 DIAGNOSIS — M25561 Pain in right knee: Secondary | ICD-10-CM | POA: Diagnosis not present

## 2017-01-08 ENCOUNTER — Ambulatory Visit (INDEPENDENT_AMBULATORY_CARE_PROVIDER_SITE_OTHER): Payer: Medicare HMO | Admitting: Family Medicine

## 2017-01-08 ENCOUNTER — Encounter: Payer: Self-pay | Admitting: Family Medicine

## 2017-01-08 VITALS — BP 146/72 | HR 74 | Temp 98.7°F | Ht 68.5 in | Wt 208.5 lb

## 2017-01-08 DIAGNOSIS — R1909 Other intra-abdominal and pelvic swelling, mass and lump: Secondary | ICD-10-CM | POA: Diagnosis not present

## 2017-01-08 NOTE — Patient Instructions (Signed)
Stop at check out for referral for surgery visit  If the symptoms worsen or pain develops or you are unable to reduce the lump - alert [place] and get to the ED if needed  Avoid excessive straining

## 2017-01-08 NOTE — Progress Notes (Signed)
Subjective:    Patient ID: [name], male    DOB: 08-Sep-1946, 71 y.o.   MRN: [id number]  HPI Here with possible hernia   Left side -groin area / pressure sensation (left abdomen just above belt line) Noted it when he was doing a back press with a belt on  Felt something pop at that time  Not painful  Can feel some fullness  Notices it more when he is walking   He has not been lifting weights  Does do deep water running  No straining to have a bowel movement   Has had an umbilical hernia  Never an inguinal one   Patient Active Problem List   Diagnosis Date Noted  . Knee pain, right 12/26/2016  . Routine general medical examination at a health care facility 01/20/2016  . Hyperglycemia 01/20/2016  . Heart murmur, systolic 05/09/1974  . PSA elevation 01/13/2014  . Encounter for Medicare annual wellness exam 09/24/2011  . Prostate cancer screening 09/24/2011  . Osteoarthritis, knee 02/14/2011  . SUPERFICIAL INJURY OF CONJUNCTIVA 11/30/2010  . TINNITUS 12/09/2009  . BACK PAIN, LUMBAR, WITH RADICULOPATHY 10/10/2009  . ESSENTIAL HYPERTENSION, BENIGN 08/16/2009  . Thrombocytopenia ([place]) 02/15/2009  . Hyperlipidemia 01/07/2008   Past Medical History:  Diagnosis Date  . Back pain   . Diverticulosis   . History of colon polyps   . Hyperlipidemia    No past surgical history on file. Social History  Substance Use Topics  . Smoking status: Never Smoker  . Smokeless tobacco: Never Used  . Alcohol use 0.0 oz/week     Comment: occ   Family History  Problem Relation Age of Onset  . Cancer Mother     brain tumor  . Emphysema Father   . Diabetes Father   . HIV Brother    Allergies  Allergen Reactions  . Ezetimibe-Simvastatin     REACTION: back pain   Current Outpatient Prescriptions on File Prior to Visit  Medication Sig Dispense Refill  . amLODipine (NORVASC) 5 MG tablet Take 1 tablet (5 mg total) by mouth daily. 30 tablet 11  . atorvastatin (LIPITOR) 40 MG  tablet Take 1 tablet (40 mg total) by mouth daily. 30 tablet 11  . meloxicam (MOBIC) 15 MG tablet Take 1 tablet (15 mg total) by mouth daily. With a meal 30 tablet 1   No current facility-administered medications on file prior to visit.     Review of Systems Review of Systems  Constitutional: Negative for fever, appetite change, fatigue and unexpected weight change.  Eyes: Negative for pain and visual disturbance.  Respiratory: Negative for cough and shortness of breath.   Cardiovascular: Negative for cp or palpitations    Gastrointestinal: Negative for nausea, diarrhea and constipation.  Genitourinary: Negative for urgency and frequency. pos for occ swelling of L groin  Skin: Negative for pallor or rash   MSK pos for knee pain and swelling that are improving  Neurological: Negative for weakness, light-headedness, numbness and headaches.  Hematological: Negative for adenopathy. Does not bruise/bleed easily.  Psychiatric/Behavioral: Negative for dysphoric mood. The patient is not nervous/anxious.         Objective:   Physical Exam  Constitutional: He appears well-developed and well-nourished. No distress.  Well appearing   Eyes: Conjunctivae and EOM are normal. Pupils are equal, round, and reactive to light.  Cardiovascular: Normal rate, regular rhythm and normal heart sounds.   Pulmonary/Chest: No respiratory distress.  Abdominal: Soft. Bowel sounds are normal. He exhibits no  distension and no mass. There is no tenderness. Hernia confirmed negative in the right inguinal area and confirmed negative in the left inguinal area.  Small umbilical hernia noted- reducible and nt   Genitourinary: Right testis shows no mass, no swelling and no tenderness. Left testis shows no mass, no swelling and no tenderness. No penile erythema or penile tenderness. No discharge found.  Genitourinary Comments: No findings in inguinal canal Some mild fullness of L groin that is more pronounced with valsalva    Nl bs  Skin: Skin is warm and dry. No erythema.  Psychiatric: He has a normal mood and affect.          Assessment & Plan:   Problem List Items Addressed This Visit      Other   Groin swelling    Intermittent and L sided-worse with walking in an athlete  Possible early /small inguinal hernia  He is doing low impact activities and avoiding straining  Nothing large enough to reduce today- just mild fullness in L groin    Nl testicular exam  Ref to gen surg for eval and tx       Relevant Orders   Ambulatory referral to General Surgery

## 2017-01-08 NOTE — Assessment & Plan Note (Addendum)
Intermittent and L sided-worse with walking in an athlete  Possible early /small inguinal hernia  He is doing low impact activities and avoiding straining  Nothing large enough to reduce today- just mild fullness in L groin    Nl testicular exam  Ref to gen surg for eval and tx  Given handout on hernias to review

## 2017-01-08 NOTE — Progress Notes (Signed)
Pre visit review using our clinic review tool, if applicable. No additional management support is needed unless otherwise documented below in the visit note. 

## 2017-01-21 ENCOUNTER — Other Ambulatory Visit: Payer: Medicare Other

## 2017-01-21 ENCOUNTER — Ambulatory Visit: Payer: Medicare HMO

## 2017-01-25 ENCOUNTER — Encounter: Payer: Medicare Other | Admitting: Family Medicine

## 2017-01-29 ENCOUNTER — Encounter: Payer: Medicare Other | Admitting: Family Medicine

## 2017-01-31 ENCOUNTER — Other Ambulatory Visit: Payer: Self-pay | Admitting: Surgery

## 2017-01-31 DIAGNOSIS — K429 Umbilical hernia without obstruction or gangrene: Secondary | ICD-10-CM | POA: Diagnosis not present

## 2017-01-31 DIAGNOSIS — K4020 Bilateral inguinal hernia, without obstruction or gangrene, not specified as recurrent: Secondary | ICD-10-CM | POA: Diagnosis not present

## 2017-02-04 ENCOUNTER — Telehealth: Payer: Self-pay | Admitting: Family Medicine

## 2017-02-04 DIAGNOSIS — Z0000 Encounter for general adult medical examination without abnormal findings: Secondary | ICD-10-CM

## 2017-02-04 DIAGNOSIS — R739 Hyperglycemia, unspecified: Secondary | ICD-10-CM

## 2017-02-04 DIAGNOSIS — Z125 Encounter for screening for malignant neoplasm of prostate: Secondary | ICD-10-CM

## 2017-02-04 NOTE — Telephone Encounter (Signed)
Lab order

## 2017-02-05 ENCOUNTER — Ambulatory Visit (INDEPENDENT_AMBULATORY_CARE_PROVIDER_SITE_OTHER): Payer: Medicare HMO

## 2017-02-05 VITALS — BP 122/80 | HR 74 | Temp 97.9°F | Ht 68.25 in | Wt 206.0 lb

## 2017-02-05 DIAGNOSIS — Z0000 Encounter for general adult medical examination without abnormal findings: Secondary | ICD-10-CM | POA: Diagnosis not present

## 2017-02-05 DIAGNOSIS — Z125 Encounter for screening for malignant neoplasm of prostate: Secondary | ICD-10-CM | POA: Diagnosis not present

## 2017-02-05 DIAGNOSIS — R739 Hyperglycemia, unspecified: Secondary | ICD-10-CM | POA: Diagnosis not present

## 2017-02-05 DIAGNOSIS — Z1159 Encounter for screening for other viral diseases: Secondary | ICD-10-CM

## 2017-02-05 LAB — COMPREHENSIVE METABOLIC PANEL
ALT: 20 U/L (ref 0–53)
AST: 19 U/L (ref 0–37)
Albumin: 4.9 g/dL (ref 3.5–5.2)
Alkaline Phosphatase: 57 U/L (ref 39–117)
BUN: 18 mg/dL (ref 6–23)
CALCIUM: 9.9 mg/dL (ref 8.4–10.5)
CHLORIDE: 103 meq/L (ref 96–112)
CO2: 28 meq/L (ref 19–32)
Creatinine, Ser: 0.92 mg/dL (ref 0.40–1.50)
GFR: 86.84 mL/min (ref >60.00)
Glucose, Bld: 123 mg/dL — ABNORMAL HIGH (ref 70–99)
POTASSIUM: 5.1 meq/L (ref 3.5–5.1)
Sodium: 137 mEq/L (ref 135–145)
Total Bilirubin: 1.2 mg/dL (ref 0.2–1.2)
Total Protein: 7.2 g/dL (ref 6.0–8.3)

## 2017-02-05 LAB — LIPID PANEL
CHOLESTEROL: 234 mg/dL — AB (ref 0–200)
HDL: 61.1 mg/dL (ref >39.00)
LDL CALC: 155 mg/dL — AB (ref 0–99)
NonHDL: 173.09
TRIGLYCERIDES: 89 mg/dL (ref 0.0–149.0)
Total CHOL/HDL Ratio: 4
VLDL: 17.8 mg/dL (ref 0.0–40.0)

## 2017-02-05 LAB — TSH: TSH: 3.2 u[IU]/mL (ref 0.35–4.50)

## 2017-02-05 LAB — CBC WITH DIFFERENTIAL/PLATELET
BASOS PCT: 0.9 % (ref 0.0–3.0)
Basophils Absolute: 0.1 10*3/uL (ref 0.0–0.1)
Eosinophils Absolute: 0.3 10*3/uL (ref 0.0–0.7)
Eosinophils Relative: 4.9 % (ref 0.0–5.0)
HEMATOCRIT: 43.6 % (ref 39.0–52.0)
Hemoglobin: 15.4 g/dL (ref 13.0–17.0)
LYMPHS ABS: 1.5 10*3/uL (ref 0.7–4.0)
LYMPHS PCT: 26.1 % (ref 12.0–46.0)
MCHC: 35.4 g/dL (ref 30.0–36.0)
MCV: 92.1 fl (ref 78.0–100.0)
MONOS PCT: 8.5 % (ref 3.0–12.0)
Monocytes Absolute: 0.5 10*3/uL (ref 0.1–1.0)
NEUTROS PCT: 59.6 % (ref 43.0–77.0)
Neutro Abs: 3.5 10*3/uL (ref 1.4–7.7)
PLATELETS: 149 10*3/uL — AB (ref 150.0–400.0)
RBC: 4.73 Mil/uL (ref 4.22–5.81)
RDW: 13.3 % (ref 11.5–15.5)
WBC: 5.8 10*3/uL (ref 4.0–10.5)

## 2017-02-05 LAB — HEMOGLOBIN A1C: Hgb A1c MFr Bld: 5.3 % (ref 4.6–6.5)

## 2017-02-05 LAB — PSA, MEDICARE: PSA: 1.06 ng/mL (ref 0.10–4.00)

## 2017-02-05 NOTE — Progress Notes (Signed)
PCP notes:   Health maintenance:   Colonoscopy - pt will discuss with PCP at next appt Hep C screening - completed  Abnormal screenings:   Hearing - failed Mini-Cog score: 19/20  Patient concerns:   Chronic right knee pain. Pain scale: 2/10.  Nurse concerns:  None  Next PCP appt:   02/12/17 @ 1545  I reviewed health advisor's note, was available for consultation, and agree with documentation and plan. Loura Pardon MD

## 2017-02-05 NOTE — Patient Instructions (Signed)
[name] , Thank you for taking time to come for your Medicare Wellness Visit. I appreciate your ongoing commitment to your health goals. Please review the following plan we discussed and let me know if I can assist you in the future.   These are the goals we discussed: Goals    . Increase physical activity          Starting 02/05/17, I will continue to exercise for at least 60 min 7 days per week.        This is a list of the screening recommended for you and due dates:  Health Maintenance  Topic Date Due  . Colon Cancer Screening  02/25/2018*  . Flu Shot  02/05/2026*  . Tetanus Vaccine  01/13/2023  .  Hepatitis C: One time screening is recommended by Center for Disease Control  (CDC) for  adults born from [birth year] through 1963.   Completed  . Pneumonia vaccines  Completed  *Topic was postponed. The date shown is not the original due date.   Preventive Care for Adults  A healthy lifestyle and preventive care can promote health and wellness. Preventive health guidelines for adults include the following key practices.  . A routine yearly physical is a good way to check with your health care provider about your health and preventive screening. It is a chance to share any concerns and updates on your health and to receive a thorough exam.  . Visit your dentist for a routine exam and preventive care every 6 months. Brush your teeth twice a day and floss once a day. Good oral hygiene prevents tooth decay and gum disease.  . The frequency of eye exams is based on your age, health, family medical history, use  of contact lenses, and other factors. Follow your health care provider's ecommendations for frequency of eye exams.  . Eat a healthy diet. Foods like vegetables, fruits, whole grains, low-fat dairy products, and lean protein foods contain the nutrients you need without too many calories. Decrease your intake of foods high in solid fats, added sugars, and salt. Eat the right amount of  calories for you. Get information about a proper diet from your health care provider, if necessary.  . Regular physical exercise is one of the most important things you can do for your health. Most adults should get at least 150 minutes of moderate-intensity exercise (any activity that increases your heart rate and causes you to sweat) each week. In addition, most adults need muscle-strengthening exercises on 2 or more days a week.  Silver Sneakers may be a benefit available to you. To determine eligibility, you may visit the website: www.silversneakers.com or contact program at 660-587-1960 Mon-Fri between 8AM-8PM.   . Maintain a healthy weight. The body mass index (BMI) is a screening tool to identify possible weight problems. It provides an estimate of body fat based on height and weight. Your health care provider can find your BMI and can help you achieve or maintain a healthy weight.   For adults 20 years and older: ? A BMI below 18.5 is considered underweight. ? A BMI of 18.5 to 24.9 is normal. ? A BMI of 25 to 29.9 is considered overweight. ? A BMI of 30 and above is considered obese.   . Maintain normal blood lipids and cholesterol levels by exercising and minimizing your intake of saturated fat. Eat a balanced diet with plenty of fruit and vegetables. Blood tests for lipids and cholesterol should begin at age 27  and be repeated every 5 years. If your lipid or cholesterol levels are high, you are over 50, or you are at high risk for heart disease, you may need your cholesterol levels checked more frequently. Ongoing high lipid and cholesterol levels should be treated with medicines if diet and exercise are not working.  . If you smoke, find out from your health care provider how to quit. If you do not use tobacco, please do not start.  . If you choose to drink alcohol, please do not consume more than 2 drinks per day. One drink is considered to be 12 ounces (355 mL) of beer, 5 ounces  (148 mL) of wine, or 1.5 ounces (44 mL) of liquor.  . If you are 82-78 years old, ask your health care provider if you should take aspirin to prevent strokes.  . Use sunscreen. Apply sunscreen liberally and repeatedly throughout the day. You should seek shade when your shadow is shorter than you. Protect yourself by wearing long sleeves, pants, a wide-brimmed hat, and sunglasses year round, whenever you are outdoors.  . Once a month, do a whole body skin exam, using a mirror to look at the skin on your back. Tell your health care provider of new moles, moles that have irregular borders, moles that are larger than a pencil eraser, or moles that have changed in shape or color.

## 2017-02-05 NOTE — Progress Notes (Signed)
Pre visit review using our clinic review tool, if applicable. No additional management support is needed unless otherwise documented below in the visit note. 

## 2017-02-05 NOTE — Progress Notes (Signed)
Subjective:   [name] is a 71 y.o. male who presents for Medicare Annual/Subsequent preventive examination.  Review of Systems:  N/A Cardiac Risk Factors include: dyslipidemia;hypertension;male gender;advanced age (>87men, >28 women);obesity (BMI >30kg/m2)     Objective:    Vitals: BP 122/80 (BP Location: Right Arm, Patient Position: Sitting, Cuff Size: Normal)   Pulse 74   Temp 97.9 F (36.6 C) (Oral)   Ht 5' 8.25" (1.734 m)   Wt 206 lb (93.4 kg)   SpO2 97%   BMI 31.09 kg/m   Body mass index is 31.09 kg/m.  Tobacco History  Smoking Status  . Never Smoker  Smokeless Tobacco  . Never Used     Counseling given: No   Past Medical History:  Diagnosis Date  . Back pain   . Diverticulosis   . History of colon polyps   . Hyperlipidemia    History reviewed. No pertinent surgical history. Family History  Problem Relation Age of Onset  . Cancer Mother     brain tumor  . Emphysema Father   . Diabetes Father   . HIV Brother    History  Sexual Activity  . Sexual activity: Yes    Outpatient Encounter Prescriptions as of 02/05/2017  Medication Sig  . amLODipine (NORVASC) 5 MG tablet Take 1 tablet (5 mg total) by mouth daily.  Marland Kitchen atorvastatin (LIPITOR) 40 MG tablet Take 1 tablet (40 mg total) by mouth daily.  . meloxicam (MOBIC) 15 MG tablet Take 1 tablet (15 mg total) by mouth daily. With a meal   No facility-administered encounter medications on file as of 02/05/2017.     Activities of Daily Living In your present state of health, do you have any difficulty performing the following activities: 02/05/2017  Hearing? N  Vision? N  Difficulty concentrating or making decisions? N  Walking or climbing stairs? N  Dressing or bathing? N  Doing errands, shopping? N  Preparing Food and eating ? N  Using the Toilet? N  In the past six months, have you accidently leaked urine? N  Do you have problems with loss of bowel control? N  Managing your Medications? N    Managing your Finances? N  Housekeeping or managing your Housekeeping? N  Some recent data might be hidden    Patient Care Team: Abner Greenspan, MD as PCP - General   Assessment:     Hearing Screening   125Hz  250Hz  500Hz  1000Hz  2000Hz  3000Hz  4000Hz  6000Hz  8000Hz   Right ear:   0 0 40  0    Left ear:   0 40 40  0      Visual Acuity Screening   Right eye Left eye Both eyes  Without correction: 20/40 20/30 20/30   With correction:       Exercise Activities and Dietary recommendations Current Exercise Habits: Home exercise routine, Type of exercise: Other - see comments (hiking, kayaking), Frequency (Times/Week): 7, Intensity: Intense, Exercise limited by: None identified  Goals    . Increase physical activity          Starting 02/05/17, I will continue to exercise for at least 60 min 7 days per week.       Fall Risk Fall Risk  02/05/2017 01/20/2016 01/18/2015 01/13/2014  Falls in the past year? No Yes Yes No  Number falls in past yr: - 1 2 or more -  Injury with Fall? - No No -   Depression Screen [facility] 2/9 Scores 02/05/2017 01/20/2016 01/18/2015 01/13/2014  PHQ - 2 Score 0 0 0 0    Cognitive Function MMSE - Mini Mental State Exam 02/05/2017  Orientation to time 5  Orientation to Place 5  Registration 3  Attention/ Calculation 0  Recall 2  Recall-comments pt was unable to recall 1 of 3  words  Language- name 2 objects 0  Language- repeat 1  Language- follow 3 step command 3  Language- read & follow direction 0  Write a sentence 0  Copy design 0  Total score 19     PLEASE NOTE: A Mini-Cog screen was completed. Maximum score is 20. A value of 0 denotes this part of Folstein MMSE was not completed or the patient failed this part of the Mini-Cog screening.   Mini-Cog Screening Orientation to Time - Max 5 pts Orientation to Place - Max 5 pts Registration - Max 3 pts Recall - Max 3 pts Language Repeat - Max 1 pts Language Follow 3 Step Command - Max 3 pts      Immunization History  Administered Date(s) Administered  . Pneumococcal Conjugate-13 01/18/2015  . Pneumococcal Polysaccharide-23 01/20/2016  . Td 11/11/2002  . Tdap 01/12/2013   Screening Tests Health Maintenance  Topic Date Due  . COLONOSCOPY  02/25/2018 (Originally 12/27/2015)  . INFLUENZA VACCINE  02/05/2026 (Originally 05/29/2017)  . TETANUS/TDAP  01/13/2023  . Hepatitis C Screening  Completed  . PNA vac Low Risk Adult  Completed      Plan:     I have personally reviewed and addressed the Medicare Annual Wellness questionnaire and have noted the following in the patient's chart:  A. Medical and social history B. Use of alcohol, tobacco or illicit drugs  C. Current medications and supplements D. Functional ability and status E.  Nutritional status F.  Physical activity G. Advance directives H. List of other physicians I.  Hospitalizations, surgeries, and ER visits in previous 12 months J.  [place] to include hearing, vision, cognitive, depression L. Referrals and appointments - none  In addition, I have reviewed and discussed with patient certain preventive protocols, quality metrics, and best practice recommendations. A written personalized care plan for preventive services as well as general preventive health recommendations were provided to patient.  See attached scanned questionnaire for additional information.   Signed,   Lindell Noe, MHA, BS, LPN Health Coach

## 2017-02-06 LAB — HEPATITIS C ANTIBODY: HCV AB: NEGATIVE

## 2017-02-07 ENCOUNTER — Other Ambulatory Visit: Payer: Self-pay | Admitting: Family Medicine

## 2017-02-12 ENCOUNTER — Ambulatory Visit (INDEPENDENT_AMBULATORY_CARE_PROVIDER_SITE_OTHER): Payer: Medicare HMO | Admitting: Family Medicine

## 2017-02-12 ENCOUNTER — Encounter: Payer: Self-pay | Admitting: Family Medicine

## 2017-02-12 ENCOUNTER — Encounter (HOSPITAL_COMMUNITY): Payer: Self-pay | Admitting: *Deleted

## 2017-02-12 VITALS — BP 132/70 | HR 85 | Temp 98.9°F | Ht 68.25 in | Wt 205.5 lb

## 2017-02-12 DIAGNOSIS — Z125 Encounter for screening for malignant neoplasm of prostate: Secondary | ICD-10-CM | POA: Diagnosis not present

## 2017-02-12 DIAGNOSIS — E7800 Pure hypercholesterolemia, unspecified: Secondary | ICD-10-CM | POA: Diagnosis not present

## 2017-02-12 DIAGNOSIS — Z0000 Encounter for general adult medical examination without abnormal findings: Secondary | ICD-10-CM | POA: Diagnosis not present

## 2017-02-12 DIAGNOSIS — D696 Thrombocytopenia, unspecified: Secondary | ICD-10-CM | POA: Diagnosis not present

## 2017-02-12 DIAGNOSIS — R739 Hyperglycemia, unspecified: Secondary | ICD-10-CM

## 2017-02-12 DIAGNOSIS — Z1211 Encounter for screening for malignant neoplasm of colon: Secondary | ICD-10-CM | POA: Insufficient documentation

## 2017-02-12 DIAGNOSIS — I10 Essential (primary) hypertension: Secondary | ICD-10-CM

## 2017-02-12 MED ORDER — ATORVASTATIN CALCIUM 40 MG PO TABS: 40.0000 mg | ORAL_TABLET | Freq: Every evening | ORAL | 3 refills | Status: DC

## 2017-02-12 MED ORDER — AMLODIPINE BESYLATE 5 MG PO TABS: ORAL_TABLET | ORAL | 3 refills | Status: DC

## 2017-02-12 NOTE — Patient Instructions (Addendum)
We will refer you for screening colonoscopy   If you are interested in a shingles/zoster vaccine (Shigrix)- call your insurance to check on coverage,( you should not get it within 1 month of other vaccines) , then call us for a prescription  for it to take to a pharmacy that gives the shot , or make a nurse visit to get it here depending on your coverage    for cholesterol  Avoid red meat/ fried foods/ egg yolks/ fatty breakfast meats/ butter, cheese and high fat dairy/ and shellfish   Work on diet and we will re check in 3 months   Good luck with your hernia surgery

## 2017-02-12 NOTE — Progress Notes (Signed)
Mr [name] denies chest pain or shortness of breath.  I instructed patient to not take any more Ibuprofen or Mobic until after surgery.

## 2017-02-12 NOTE — Progress Notes (Signed)
Subjective:    Patient ID: [name], male    DOB: 05-20-1946, 71 y.o.   MRN: [id number]  HPI Here for health maintenance exam and to review chronic medical problems    Has hernia surgery tomorrow am  Is watching activity and being careful  Fixing several hernias at one time   Wt Readings from Last 3 Encounters:  02/12/17 205 lb 8 oz (93.2 kg)  02/05/17 206 lb (93.4 kg)  01/08/17 208 lb 8 oz (94.6 kg)  down 3 lb - eats fairly healthy/ but not all the time  Still kayaking and swimming and running in deep water-enjoys it   bmi 31.0  Had AMW on 4/10 Hearing - missed most on R ear and low/high in L ear  He has not noticed it / has not bothered him except in crowds , not turning TV too loud and family has noticed Not ready for hearing aides  Mini cog missed one of 3 word recall   (he has no worries about memory)  Hep c screen neg   Colonoscopy 2/07 diverticulosis  He is ready to schedule that   Flu shots-declines   Shingles vaccine - is interested if affordable    Tetanus shot 3/14  utd other imms  Prostate cancer screening Lab Results  Component Value Date   PSA 1.06 02/05/2017   PSA 0.78 01/13/2016   PSA 1.00 01/11/2015   no prostate problems No flow problems  Nocturia times one  GF may have had prostate cancer-not sure     bp is stable today  No cp or palpitations or headaches or edema  No side effects to medicines  BP Readings from Last 3 Encounters:  02/12/17 (!) 142/76  02/05/17 122/80  01/08/17 (!) 146/72      Hx of thrombocytopenia Lab Results  Component Value Date   WBC 5.8 02/05/2017   HGB 15.4 02/05/2017   HCT 43.6 02/05/2017   MCV 92.1 02/05/2017   PLT 149.0 (L) 02/05/2017    Hx of hyperlipidemia Lab Results  Component Value Date   CHOL 234 (H) 02/05/2017   CHOL 210 (H) 04/23/2016   CHOL 210 (H) 01/13/2016   Lab Results  Component Value Date   HDL 61.10 02/05/2017   HDL 61.80 04/23/2016   HDL 57.70 01/13/2016   Lab  Results  Component Value Date   LDLCALC 155 (H) 02/05/2017   LDLCALC 130 (H) 04/23/2016   LDLCALC 142 (H) 01/13/2016   Lab Results  Component Value Date   TRIG 89.0 02/05/2017   TRIG 91.0 04/23/2016   TRIG 53.0 01/13/2016   Lab Results  Component Value Date   CHOLHDL 4 02/05/2017   CHOLHDL 3 04/23/2016   CHOLHDL 4 01/13/2016   Lab Results  Component Value Date   LDLDIRECT 130.0 01/06/2013   LDLDIRECT 143.1 10/01/2011   LDLDIRECT 139.4 04/05/2010   On atorvastatin and diet  No missed doses  Not eating any different  occ potato chips  Red meat - 3 days per week    Hx of hyperglycemia Lab Results  Component Value Date   HGBA1C 5.3 02/05/2017  this is stable   Patient Active Problem List   Diagnosis Date Noted  . Colon cancer screening 02/12/2017  . Groin swelling 01/08/2017  . Knee pain, right 12/26/2016  . Routine general medical examination at a health care facility 01/20/2016  . Hyperglycemia 01/20/2016  . Heart murmur, systolic 78/67/6720  . Encounter for Medicare annual wellness exam 09/24/2011  .  Prostate cancer screening 09/24/2011  . Osteoarthritis, knee 02/14/2011  . TINNITUS 12/09/2009  . BACK PAIN, LUMBAR, WITH RADICULOPATHY 10/10/2009  . ESSENTIAL HYPERTENSION, BENIGN 08/16/2009  . Thrombocytopenia ([address]) 02/15/2009  . Hyperlipidemia 01/07/2008   Past Medical History:  Diagnosis Date  . Arthritis   . Back pain   . Diverticulosis   . Dyspnea    with exertion  . Heart murmur   . History of colon polyps   . Hyperlipidemia   . Hypertension    Past Surgical History:  Procedure Laterality Date  . COLONOSCOPY    . LASIK     Social History  Substance Use Topics  . Smoking status: Never Smoker  . Smokeless tobacco: Never Used  . Alcohol use 8.4 oz/week    7 Cans of beer, 7 Glasses of wine per week     Comment: occ   Family History  Problem Relation Age of Onset  . Cancer Mother     brain tumor  . Emphysema Father   . Diabetes Father     . HIV Brother    Allergies  Allergen Reactions  . Ezetimibe-Simvastatin     REACTION: back pain   Current Facility-Administered Medications on File Prior to Visit  Medication Dose Route Frequency Provider Last Rate Last Dose  . ceFAZolin (ANCEF) IVPB 2g/100 mL premix  2 g Intravenous On Call to [place], MD      . Chlorhexidine Gluconate Cloth 2 % PADS 6 each  6 each Topical Once Coralie Keens, MD       And  . Chlorhexidine Gluconate Cloth 2 % PADS 6 each  6 each Topical Once Coralie Keens, MD      . lactated ringers infusion   Intravenous Continuous Rica Koyanagi, MD       Current Outpatient Prescriptions on File Prior to Visit  Medication Sig Dispense Refill  . Cyanocobalamin (B-12 PO) Take 1 tablet by mouth daily.    Marland Kitchen doxylamine, Sleep, (UNISOM) 25 MG tablet Take 25 mg by mouth at bedtime.    Marland Kitchen ibuprofen (ADVIL,MOTRIN) 200 MG tablet Take 400 mg by mouth 2 (two) times daily as needed for headache or moderate pain.    . meloxicam (MOBIC) 15 MG tablet Take 1 tablet (15 mg total) by mouth daily. With a meal (Patient taking differently: Take 15 mg by mouth daily as needed for pain. With a meal) 30 tablet 1     Review of Systems Review of Systems  Constitutional: Negative for fever, appetite change, fatigue and unexpected weight change.  Eyes: Negative for pain and visual disturbance.  Respiratory: Negative for cough and shortness of breath.   Cardiovascular: Negative for cp or palpitations    Gastrointestinal: Negative for nausea, diarrhea and constipation. pos for umb and inguinal hernias Genitourinary: Negative for urgency and frequency.  Skin: Negative for pallor or rash   Neurological: Negative for weakness, light-headedness, numbness and headaches.  Hematological: Negative for adenopathy. Does not bruise/bleed easily.  Psychiatric/Behavioral: Negative for dysphoric mood. The patient is not nervous/anxious.         Objective:   Physical Exam   Constitutional: He appears well-developed and well-nourished. No distress.  obese and well appearing   HENT:  Head: Normocephalic and atraumatic.  Right Ear: External ear normal.  Left Ear: External ear normal.  Nose: Nose normal.  Mouth/Throat: Oropharynx is clear and moist.  Eyes: Conjunctivae and EOM are normal. Pupils are equal, round, and reactive to light. Right eye exhibits no  discharge. Left eye exhibits no discharge. No scleral icterus.  Neck: Normal range of motion. Neck supple. No JVD present. Carotid bruit is not present. No thyromegaly present.  Cardiovascular: Normal rate, regular rhythm, normal heart sounds and intact distal pulses.  Exam reveals no gallop.   Pulmonary/Chest: Effort normal and breath sounds normal. No respiratory distress. He has no wheezes. He exhibits no tenderness.  Abdominal: Soft. Bowel sounds are normal. He exhibits no distension, no abdominal bruit and no mass. There is no tenderness.  Umbilical hernia noted  Musculoskeletal: He exhibits no edema or tenderness.  Lymphadenopathy:    He has no cervical adenopathy.  Neurological: He is alert. He has normal reflexes. No cranial nerve deficit. He exhibits normal muscle tone. Coordination normal.  Skin: Skin is warm and dry. No rash noted. No erythema. No pallor.  Solar lentigines diffusely  Psychiatric: He has a normal mood and affect.          Assessment & Plan:   Problem List Items Addressed This Visit      Cardiovascular and Mediastinum   ESSENTIAL HYPERTENSION, BENIGN - Primary    bp in fair control at this time  BP Readings from Last 1 Encounters:  02/12/17 132/70   No changes needed Disc lifstyle change with low sodium diet and exercise  Labs reviewed       Relevant Medications   amLODipine (NORVASC) 5 MG tablet   atorvastatin (LIPITOR) 40 MG tablet     Other   Colon cancer screening    Referred for screening colonoscopy      Relevant Orders   Ambulatory referral to  Gastroenterology   Hyperglycemia    Lab Results  Component Value Date   HGBA1C 5.3 02/05/2017   disc imp of low glycemic diet and wt loss to prevent DM2 Diet info given Exercise as tolerated      Hyperlipidemia    Disc goals for lipids and reasons to control them Rev labs with pt Rev low sat fat diet in detail Will work harder on diet- LDL is up On atorvastatin 40-may have to inc it Re check 3 mo       Relevant Medications   amLODipine (NORVASC) 5 MG tablet   atorvastatin (LIPITOR) 40 MG tablet   Prostate cancer screening    Lab Results  Component Value Date   PSA 1.06 02/05/2017   PSA 0.78 01/13/2016   PSA 1.00 01/11/2015   No symptoms       Routine general medical examination at a health care facility    Reviewed health habits including diet and exercise and skin cancer prevention Reviewed appropriate screening tests for age  Also reviewed health mt list, fam hx and immunization status , as well as social and family history   See hpi Labs reviewed  For hernia surgery tomorrow We will refer you for screening colonoscopy   If you are interested in a shingles/zoster vaccine (Shigrix)- call your insurance to check on coverage,( you should not get it within 1 month of other vaccines) , then call us for a prescription  for it to take to a pharmacy that gives the shot , or make a nurse visit to get it here depending on your coverage    for cholesterol  Avoid red meat/ fried foods/ egg yolks/ fatty breakfast meats/ butter, cheese and high fat dairy/ and shellfish   Work on diet and we will re check in 3 months        Thrombocytopenia (  [place])    CBC is re assuring  Platelet ct 149 No bruising or bleeding

## 2017-02-12 NOTE — Progress Notes (Signed)
Pre visit review using our clinic review tool, if applicable. No additional management support is needed unless otherwise documented below in the visit note. 

## 2017-02-12 NOTE — H&P (Signed)
[name] 01/31/2017 10:59 AM Location: [facility] Patient #: [id number] DOB: Apr 29, 1946 Married / Language: English / Race: White Male   History of Present Illness (Aaryanna Hyden A. Ninfa Linden MD; 01/31/2017 11:16 AM) The patient is a 71 year old male who presents for an evaluation of a hernia. This is a pleasant gentleman referred by Dr. Loura Pardon for evaluation of a left inguinal hernia. He was lifting heavy objects at the gym when he felt a sudden pull and discomfort in the left groin. He has since that time noticed a bulge in the groin. He reports that it easily reduces. He has no discomfort. He has no effective symptoms. He is otherwise without complaints. He is an active gentleman   Past Surgical History Malachy Moan, RMA; 01/31/2017 10:59 AM) No pertinent past surgical history   Diagnostic Studies History Malachy Moan, RMA; 01/31/2017 10:59 AM) Colonoscopy  >10 years ago  Allergies Malachy Moan, RMA; 01/31/2017 11:00 AM) Ezetimibe-Simvastatin *ANTIHYPERLIPIDEMICS*  soreness  Medication History Malachy Moan, RMA; 01/31/2017 11:00 AM) AmLODIPine Besylate (5MG  Tablet, Oral) Active. Atorvastatin Calcium (40MG  Tablet, Oral) Active. Meloxicam (15MG  Tablet, Oral) Active. Medications Reconciled  Social History Malachy Moan, [place]; 01/31/2017 10:59 AM) Alcohol use  Moderate alcohol use. Caffeine use  Coffee. Illicit drug use  Remotely quit drug use. Tobacco use  Never smoker.  Family History Malachy Moan, [place]; 01/31/2017 10:59 AM) Arthritis  Father. Cancer  Mother. Diabetes Mellitus  Father.  Other Problems Malachy Moan, RMA; 01/31/2017 10:59 AM) Back Pain  Heart murmur  High blood pressure  Hypercholesterolemia     Review of Systems Malachy Moan RMA; 01/31/2017 10:59 AM) General Not Present- Appetite Loss, Chills, Fatigue, Fever, Night Sweats, Weight Gain and Weight Loss. Skin Not Present- Change in Wart/Mole,  Dryness, Hives, Jaundice, New Lesions, Non-Healing Wounds, Rash and Ulcer. HEENT Present- Ringing in the Ears. Not Present- Earache, Hearing Loss, Hoarseness, Nose Bleed, Oral Ulcers, Seasonal Allergies, Sinus Pain, Sore Throat, Visual Disturbances, Wears glasses/contact lenses and Yellow Eyes. Respiratory Not Present- Bloody sputum, Chronic Cough, Difficulty Breathing, Snoring and Wheezing. Breast Not Present- Breast Mass, Breast Pain, Nipple Discharge and Skin Changes. Cardiovascular Not Present- Chest Pain, Difficulty Breathing Lying Down, Leg Cramps, Palpitations, Rapid Heart Rate, Shortness of Breath and Swelling of Extremities. Gastrointestinal Not Present- Abdominal Pain, Bloating, Bloody Stool, Change in Bowel Habits, Chronic diarrhea, Constipation, Difficulty Swallowing, Excessive gas, Gets full quickly at meals, Hemorrhoids, Indigestion, Nausea, Rectal Pain and Vomiting. Male Genitourinary Not Present- Blood in Urine, Change in Urinary Stream, Frequency, Impotence, Nocturia, Painful Urination, Urgency and Urine Leakage. Musculoskeletal Present- Joint Stiffness. Not Present- Back Pain, Joint Pain, Muscle Pain, Muscle Weakness and Swelling of Extremities. Neurological Not Present- Decreased Memory, Fainting, Headaches, Numbness, Seizures, Tingling, Tremor, Trouble walking and Weakness. Psychiatric Not Present- Anxiety, Bipolar, Change in Sleep Pattern, Depression, Fearful and Frequent crying. Endocrine Not Present- Cold Intolerance, Excessive Hunger, Hair Changes, Heat Intolerance, Hot flashes and New Diabetes. Hematology Not Present- Blood Thinners, Easy Bruising, Excessive bleeding, Gland problems, HIV and Persistent Infections.  Vitals Malachy Moan RMA; 01/31/2017 11:01 AM) 01/31/2017 11:00 AM Weight: 208 lb Height: 68in Body Surface Area: 2.08 m Body Mass Index: 31.63 kg/m  Temp.: 98.81F  Pulse: 72 (Regular)  BP: 120/84 (Sitting, Left Arm, Standard)       Physical  Exam (Keyon Liller A. Ninfa Linden MD; 01/31/2017 11:16 AM) General Mental Status-Alert. General Appearance-Consistent with stated age. Hydration-Well hydrated. Voice-Normal.  Head and Neck Head-normocephalic, atraumatic with no lesions or palpable masses. Trachea-midline.  Eye  Eyeball - Bilateral-Extraocular movements intact. Sclera/Conjunctiva - Bilateral-No scleral icterus.  Chest and Lung Exam Chest and lung exam reveals -quiet, even and easy respiratory effort with no use of accessory muscles and on auscultation, normal breath sounds, no adventitious sounds and normal vocal resonance. Inspection Chest Wall - Normal. Back - normal.  Cardiovascular Cardiovascular examination reveals -normal heart sounds, regular rate and rhythm with no murmurs and normal pedal pulses bilaterally.  Abdomen Inspection Skin - Scar - no surgical scars. Hernias - Linea alba - Reducible. Inguinal hernia - Left - Reducible. Right - Reducible. Note: He has a very tiny right inguinal hernia. Palpation/Percussion Palpation and Percussion of the abdomen reveal - Soft, Non Tender, No Rebound tenderness, No Rigidity (guarding) and No hepatosplenomegaly. Auscultation Auscultation of the abdomen reveals - Bowel sounds normal.  Neurologic Neurologic evaluation reveals -alert and oriented x 3 with no impairment of recent or remote memory. Mental Status-Normal.  Musculoskeletal Normal Exam - Left-Upper Extremity Strength Normal and Lower Extremity Strength Normal. Normal Exam - Right-Upper Extremity Strength Normal, Lower Extremity Weakness.    Assessment & Plan (Royal Beirne A. Ninfa Linden MD; 01/31/2017 11:17 AM) BILATERAL INGUINAL HERNIA (K40.20) Impression: This is a gentleman with bilateral inguinal hernias as well as a small hernia. Laparoscopic bilateral inguinal hernia repair with mesh is recommended along with umbilical hernia repair with possible mesh. I discussed the surgical procedure  with him in detail. I did discuss open surgery as well and he wished to proceed with a laparoscopic surgery. I discussed the risk of surgery which includes but is not limited to bleeding, infection, injury to surrounding structures, the need to convert to an open procedure, use of mesh nerve entrapment, chronic pain, recurrent hernia, postoperative recovery, etc. He understands and wished to proceed with surgery UMBILICAL HERNIA (T01.6)

## 2017-02-13 ENCOUNTER — Ambulatory Visit (HOSPITAL_COMMUNITY)
Admission: RE | Admit: 2017-02-13 | Discharge: 2017-02-13 | Disposition: A | Discharge status: Home / Self Care | Payer: Medicare HMO | Source: Ambulatory Visit | Attending: Surgery | Admitting: Surgery

## 2017-02-13 ENCOUNTER — Ambulatory Visit (HOSPITAL_COMMUNITY): Payer: Medicare HMO | Admitting: Certified Registered"

## 2017-02-13 ENCOUNTER — Encounter (HOSPITAL_COMMUNITY): Payer: Self-pay | Admitting: *Deleted

## 2017-02-13 ENCOUNTER — Encounter (HOSPITAL_COMMUNITY)
Admission: RE | Disposition: A | Discharge status: Home / Self Care | Payer: Self-pay | Source: Ambulatory Visit | Attending: Surgery

## 2017-02-13 DIAGNOSIS — K4020 Bilateral inguinal hernia, without obstruction or gangrene, not specified as recurrent: Secondary | ICD-10-CM | POA: Diagnosis not present

## 2017-02-13 DIAGNOSIS — I10 Essential (primary) hypertension: Secondary | ICD-10-CM | POA: Diagnosis not present

## 2017-02-13 DIAGNOSIS — K429 Umbilical hernia without obstruction or gangrene: Secondary | ICD-10-CM | POA: Insufficient documentation

## 2017-02-13 DIAGNOSIS — E7800 Pure hypercholesterolemia, unspecified: Secondary | ICD-10-CM | POA: Insufficient documentation

## 2017-02-13 DIAGNOSIS — M1990 Unspecified osteoarthritis, unspecified site: Secondary | ICD-10-CM | POA: Insufficient documentation

## 2017-02-13 HISTORY — DX: Essential (primary) hypertension: I10

## 2017-02-13 HISTORY — DX: Cardiac murmur, unspecified: R01.1

## 2017-02-13 HISTORY — DX: Unspecified osteoarthritis, unspecified site: M19.90

## 2017-02-13 HISTORY — PX: UMBILICAL HERNIA REPAIR: SHX196

## 2017-02-13 HISTORY — DX: Dyspnea, unspecified: R06.00

## 2017-02-13 HISTORY — PX: INGUINAL HERNIA REPAIR: SHX194

## 2017-02-13 LAB — CBC
HEMATOCRIT: 40.1 % (ref 39.0–52.0)
HEMOGLOBIN: 14.6 g/dL (ref 13.0–17.0)
MCH: 31.7 pg (ref 26.0–34.0)
MCHC: 36.4 g/dL — AB (ref 30.0–36.0)
MCV: 87.2 fL (ref 78.0–100.0)
Platelets: 143 10*3/uL — ABNORMAL LOW (ref 150–400)
RBC: 4.6 MIL/uL (ref 4.22–5.81)
RDW: 13.2 % (ref 11.5–15.5)
WBC: 5.4 10*3/uL (ref 4.0–10.5)

## 2017-02-13 LAB — BASIC METABOLIC PANEL
ANION GAP: 9 (ref 5–15)
BUN: 16 mg/dL (ref 6–20)
CHLORIDE: 106 mmol/L (ref 101–111)
CO2: 25 mmol/L (ref 22–32)
Calcium: 9.7 mg/dL (ref 8.9–10.3)
Creatinine, Ser: 0.86 mg/dL (ref 0.61–1.24)
GFR calc Af Amer: >60 mL/min (ref >60)
GLUCOSE: 116 mg/dL — AB (ref 65–99)
POTASSIUM: 4.1 mmol/L (ref 3.5–5.1)
Sodium: 140 mmol/L (ref 135–145)

## 2017-02-13 SURGERY — REPAIR, HERNIA, INGUINAL, BILATERAL, LAPAROSCOPIC
Anesthesia: General | Site: Inguinal

## 2017-02-13 MED ORDER — FENTANYL CITRATE (PF) 100 MCG/2ML IJ SOLN
INTRAMUSCULAR | Status: AC
  Filled 2017-02-13: qty 2

## 2017-02-13 MED ORDER — DEXAMETHASONE SODIUM PHOSPHATE 10 MG/ML IJ SOLN
INTRAMUSCULAR | Status: DC | PRN
  Administered 2017-02-13: 5 mg via INTRAVENOUS

## 2017-02-13 MED ORDER — MIDAZOLAM HCL 2 MG/2ML IJ SOLN
INTRAMUSCULAR | Status: AC
  Filled 2017-02-13: qty 2

## 2017-02-13 MED ORDER — SUGAMMADEX SODIUM 200 MG/2ML IV SOLN
INTRAVENOUS | Status: DC | PRN
  Administered 2017-02-13: 200 mg via INTRAVENOUS

## 2017-02-13 MED ORDER — FENTANYL CITRATE (PF) 250 MCG/5ML IJ SOLN
INTRAMUSCULAR | Status: DC | PRN
  Administered 2017-02-13: 100 ug via INTRAVENOUS
  Administered 2017-02-13: 50 ug via INTRAVENOUS

## 2017-02-13 MED ORDER — LACTATED RINGERS IV SOLN
INTRAVENOUS | Status: DC
  Administered 2017-02-13 (×2): via INTRAVENOUS

## 2017-02-13 MED ORDER — PROMETHAZINE HCL 25 MG/ML IJ SOLN: 6.2500 mg | INTRAMUSCULAR | Status: DC | PRN

## 2017-02-13 MED ORDER — HYDROCODONE-ACETAMINOPHEN 5-325 MG PO TABS: 1.0000 | ORAL_TABLET | ORAL | 0 refills | Status: DC | PRN

## 2017-02-13 MED ORDER — HYDROMORPHONE HCL 1 MG/ML IJ SOLN
INTRAMUSCULAR | Status: AC
  Filled 2017-02-13: qty 0.5

## 2017-02-13 MED ORDER — ONDANSETRON HCL 4 MG/2ML IJ SOLN
INTRAMUSCULAR | Status: DC | PRN
  Administered 2017-02-13: 4 mg via INTRAVENOUS

## 2017-02-13 MED ORDER — CHLORHEXIDINE GLUCONATE CLOTH 2 % EX PADS: 6.0000 | MEDICATED_PAD | Freq: Once | CUTANEOUS | Status: DC

## 2017-02-13 MED ORDER — CEFAZOLIN SODIUM-DEXTROSE 2-4 GM/100ML-% IV SOLN
2.0000 g | INTRAVENOUS | Status: AC
  Administered 2017-02-13: 2 g via INTRAVENOUS
  Filled 2017-02-13: qty 100

## 2017-02-13 MED ORDER — BUPIVACAINE HCL (PF) 0.25 % IJ SOLN
INTRAMUSCULAR | Status: AC
  Filled 2017-02-13: qty 30

## 2017-02-13 MED ORDER — ROCURONIUM BROMIDE 10 MG/ML (PF) SYRINGE
PREFILLED_SYRINGE | INTRAVENOUS | Status: DC | PRN
  Administered 2017-02-13: 10 mg via INTRAVENOUS
  Administered 2017-02-13: 50 mg via INTRAVENOUS

## 2017-02-13 MED ORDER — MIDAZOLAM HCL 2 MG/2ML IJ SOLN
INTRAMUSCULAR | Status: DC | PRN
  Administered 2017-02-13: 2 mg via INTRAVENOUS

## 2017-02-13 MED ORDER — 0.9 % SODIUM CHLORIDE (POUR BTL) OPTIME
TOPICAL | Status: DC | PRN
  Administered 2017-02-13: 1000 mL

## 2017-02-13 MED ORDER — BUPIVACAINE HCL (PF) 0.25 % IJ SOLN
INTRAMUSCULAR | Status: DC | PRN
  Administered 2017-02-13: 20 mL

## 2017-02-13 MED ORDER — HYDROMORPHONE HCL 1 MG/ML IJ SOLN
0.2500 mg | INTRAMUSCULAR | Status: DC | PRN
  Administered 2017-02-13 (×2): 0.5 mg via INTRAVENOUS

## 2017-02-13 MED ORDER — PROPOFOL 10 MG/ML IV BOLUS
INTRAVENOUS | Status: DC | PRN
  Administered 2017-02-13: 200 mg via INTRAVENOUS

## 2017-02-13 MED ORDER — FENTANYL CITRATE (PF) 250 MCG/5ML IJ SOLN
INTRAMUSCULAR | Status: AC
Start: 2017-02-13 — End: 2017-02-13
  Filled 2017-02-13: qty 5

## 2017-02-13 MED ORDER — PROPOFOL 10 MG/ML IV BOLUS
INTRAVENOUS | Status: AC
  Filled 2017-02-13: qty 20

## 2017-02-13 MED ORDER — LIDOCAINE 2% (20 MG/ML) 5 ML SYRINGE
INTRAMUSCULAR | Status: DC | PRN
  Administered 2017-02-13: 100 mg via INTRAVENOUS

## 2017-02-13 SURGICAL SUPPLY — 30 items
ADH SKN CLS APL DERMABOND .7 (GAUZE/BANDAGES/DRESSINGS) ×2
BLADE CLIPPER SURG (BLADE) ×1 IMPLANT
CANISTER SUCT 3000ML PPV (MISCELLANEOUS) IMPLANT
CHLORAPREP W/TINT 26ML (MISCELLANEOUS) ×3 IMPLANT
COVER SURGICAL LIGHT HANDLE (MISCELLANEOUS) ×3 IMPLANT
DERMABOND ADVANCED (GAUZE/BANDAGES/DRESSINGS) ×1
DERMABOND ADVANCED .7 DNX12 (GAUZE/BANDAGES/DRESSINGS) ×2 IMPLANT
DEVICE SECURE STRAP 25 ABSORB (INSTRUMENTS) ×3 IMPLANT
DISSECT BALLN SPACEMKR + OVL (BALLOONS) ×3
DISSECTOR BALLN SPACEMKR + OVL (BALLOONS) ×2 IMPLANT
DRAPE LAPAROSCOPIC ABDOMINAL (DRAPES) ×3 IMPLANT
ELECT REM PT RETURN 9FT ADLT (ELECTROSURGICAL) ×3
ELECTRODE REM PT RTRN 9FT ADLT (ELECTROSURGICAL) ×2 IMPLANT
GLOVE SURG SIGNA 7.5 PF LTX (GLOVE) ×3 IMPLANT
GOWN STRL REUS W/ TWL LRG LVL3 (GOWN DISPOSABLE) ×4 IMPLANT
GOWN STRL REUS W/ TWL XL LVL3 (GOWN DISPOSABLE) ×2 IMPLANT
GOWN STRL REUS W/TWL LRG LVL3 (GOWN DISPOSABLE) ×6
GOWN STRL REUS W/TWL XL LVL3 (GOWN DISPOSABLE) ×3
KIT BASIN OR (CUSTOM PROCEDURE TRAY) ×3 IMPLANT
KIT ROOM TURNOVER OR (KITS) ×3 IMPLANT
MESH 3DMAX 4X6 LT LRG (Mesh General) ×3 IMPLANT
MESH 3DMAX 4X6 RT LRG (Mesh General) ×3 IMPLANT
NS IRRIG 1000ML POUR BTL (IV SOLUTION) ×3 IMPLANT
PAD ARMBOARD 7.5X6 YLW CONV (MISCELLANEOUS) ×3 IMPLANT
SET TROCAR LAP APPLE-HUNT 5MM (ENDOMECHANICALS) ×3 IMPLANT
SUT MNCRL AB 4-0 PS2 18 (SUTURE) ×3 IMPLANT
SUT PROLENE 1 CT (SUTURE) ×1 IMPLANT
TOWEL OR 17X24 6PK STRL BLUE (TOWEL DISPOSABLE) ×3 IMPLANT
TOWEL OR 17X26 10 PK STRL BLUE (TOWEL DISPOSABLE) ×3 IMPLANT
TRAY LAPAROSCOPIC MC (CUSTOM PROCEDURE TRAY) ×3 IMPLANT

## 2017-02-13 NOTE — Assessment & Plan Note (Signed)
Reviewed health habits including diet and exercise and skin cancer prevention Reviewed appropriate screening tests for age  Also reviewed health mt list, fam hx and immunization status , as well as social and family history   See hpi Labs reviewed  For hernia surgery tomorrow We will refer you for screening colonoscopy   If you are interested in a shingles/zoster vaccine (Shigrix)- call your insurance to check on coverage,( you should not get it within 1 month of other vaccines) , then call us for a prescription  for it to take to a pharmacy that gives the shot , or make a nurse visit to get it here depending on your coverage    for cholesterol  Avoid red meat/ fried foods/ egg yolks/ fatty breakfast meats/ butter, cheese and high fat dairy/ and shellfish   Work on diet and we will re check in 3 months

## 2017-02-13 NOTE — Interval H&P Note (Signed)
History and Physical Interval Note: no change in H and P  02/13/2017 8:12 AM  [name]  has presented today for surgery, with the diagnosis of Bilateral inguinal and umbilical hernia  The various methods of treatment have been discussed with the patient and family. After consideration of risks, benefits and other options for treatment, the patient has consented to  Procedure(s): LAPAROSCOPIC BILATERAL INGUINAL HERNIA REPAIR WITH MESH (Bilateral) LAPAROSCOPIC UMBILICAL HERNIA REPAIR WITH POSSIBLE MESH (N/A) as a surgical intervention .  The patient's history has been reviewed, patient examined, no change in status, stable for surgery.  I have reviewed the patient's chart and labs.  Questions were answered to the patient's satisfaction.     Sharol Croghan A

## 2017-02-13 NOTE — Anesthesia Postprocedure Evaluation (Addendum)
Anesthesia Post Note  Patient: [name]  Procedure(s) Performed: Procedure(s) (LRB): LAPAROSCOPIC BILATERAL INGUINAL HERNIA REPAIR WITH MESH (Bilateral) LAPAROSCOPIC UMBILICAL HERNIA REPAIR (N/A)  Patient location during evaluation: PACU Anesthesia Type: General Level of consciousness: awake, sedated and oriented Pain management: pain level controlled Vital Signs Assessment: post-procedure vital signs reviewed and stable Respiratory status: spontaneous breathing, nonlabored ventilation, respiratory function stable and patient connected to nasal cannula oxygen Cardiovascular status: blood pressure returned to baseline and stable Postop Assessment: no signs of nausea or vomiting Anesthetic complications: no       Last Vitals:  Vitals:   02/13/17 1200 02/13/17 1220  BP:  (!) 152/83  Pulse: 70 65  Resp: 18   Temp: 36.5 C     Last Pain:  Vitals:   02/13/17 1220  TempSrc:   PainSc: 3                  Thadd Apuzzo,JAMES TERRILL

## 2017-02-13 NOTE — Transfer of Care (Signed)
Immediate Anesthesia Transfer of Care Note  Patient: [name]  Procedure(s) Performed: Procedure(s): LAPAROSCOPIC BILATERAL INGUINAL HERNIA REPAIR WITH MESH (Bilateral) LAPAROSCOPIC UMBILICAL HERNIA REPAIR (N/A)  Patient Location: PACU  Anesthesia Type:General  Level of Consciousness: awake, alert  and oriented  Airway & Oxygen Therapy: Patient Spontanous Breathing and Patient connected to nasal cannula oxygen  Post-op Assessment: Report given to RN and Post -op Vital signs reviewed and stable  Post vital signs: Reviewed and stable  Last Vitals:  Vitals:   02/13/17 0746  BP: (!) 177/86  Pulse: 77  Resp: 20  Temp: 36.6 C    Last Pain:  Vitals:   02/13/17 0754  TempSrc:   PainSc: 2       Patients Stated Pain Goal: 2 (19/41/74 0814)  Complications: No apparent anesthesia complications

## 2017-02-13 NOTE — Assessment & Plan Note (Signed)
CBC is re assuring  Platelet ct 149 No bruising or bleeding

## 2017-02-13 NOTE — Assessment & Plan Note (Signed)
Disc goals for lipids and reasons to control them Rev labs with pt Rev low sat fat diet in detail Will work harder on diet- LDL is up On atorvastatin 40-may have to inc it Re check 3 mo

## 2017-02-13 NOTE — Anesthesia Preprocedure Evaluation (Signed)
Anesthesia Evaluation  Patient identified by MRN, date of birth, ID band Patient awake    Reviewed: Allergy & Precautions, NPO status , Patient's Chart, lab work & pertinent test results  Airway Mallampati: II  TM Distance: >3 FB Neck ROM: Full    Dental  (+) Teeth Intact   Pulmonary shortness of breath,    breath sounds clear to auscultation       Cardiovascular hypertension,  Rhythm:Regular Rate:Normal + Systolic murmurs    Neuro/Psych    GI/Hepatic negative GI ROS, Neg liver ROS,   Endo/Other  negative endocrine ROS  Renal/GU negative Renal ROS     Musculoskeletal  (+) Arthritis ,   Abdominal   Peds  Hematology   Anesthesia Other Findings   Reproductive/Obstetrics                             Anesthesia Physical Anesthesia Plan  ASA: II  Anesthesia Plan: General   Post-op Pain Management:    Induction: Intravenous  Airway Management Planned: Oral ETT  Additional Equipment:   Intra-op Plan:   Post-operative Plan: Extubation in OR  Informed Consent: I have reviewed the patients History and Physical, chart, labs and discussed the procedure including the risks, benefits and alternatives for the proposed anesthesia with the patient or authorized representative who has indicated his/her understanding and acceptance.   Dental advisory given  Plan Discussed with: CRNA  Anesthesia Plan Comments:         Anesthesia Quick Evaluation

## 2017-02-13 NOTE — Assessment & Plan Note (Signed)
bp in fair control at this time  BP Readings from Last 1 Encounters:  02/12/17 132/70   No changes needed Disc lifstyle change with low sodium diet and exercise  Labs reviewed

## 2017-02-13 NOTE — Assessment & Plan Note (Signed)
Referred for screening colonoscopy 

## 2017-02-13 NOTE — Anesthesia Procedure Notes (Signed)
Procedure Name: Intubation Date/Time: 02/13/2017 9:45 AM Performed by: Teressa Lower Pre-anesthesia Checklist: Patient identified, Emergency Drugs available, Suction available and Patient being monitored Patient Re-evaluated:Patient Re-evaluated prior to inductionOxygen Delivery Method: Circle system utilized Preoxygenation: Pre-oxygenation with 100% oxygen Intubation Type: IV induction Ventilation: Mask ventilation without difficulty Laryngoscope Size: Mac and 4 Grade View: Grade II Tube type: Oral Tube size: 7.5 mm Number of attempts: 1 Airway Equipment and Method: Stylet and Oral airway Placement Confirmation: ETT inserted through vocal cords under direct vision,  positive ETCO2 and breath sounds checked- equal and bilateral Secured at: 23 cm Tube secured with: Tape Dental Injury: Teeth and Oropharynx as per pre-operative assessment

## 2017-02-13 NOTE — Discharge Instructions (Signed)
[facility] _______[facility]  UMBILICAL OR INGUINAL HERNIA REPAIR: POST OP INSTRUCTIONS  Always review your discharge instruction sheet given to you by the facility where your surgery was performed. IF YOU HAVE DISABILITY OR FAMILY LEAVE FORMS, YOU MUST BRING THEM TO THE OFFICE FOR PROCESSING.   DO NOT GIVE THEM TO YOUR DOCTOR.  1. A  prescription for pain medication may be given to you upon discharge.  Take your pain medication as prescribed, if needed.  If narcotic pain medicine is not needed, then you may take acetaminophen (Tylenol) or ibuprofen (Advil) as needed. 2. Take your usually prescribed medications unless otherwise directed. If you need a refill on your pain medication, please contact your pharmacy.  They will contact our office to request authorization. Prescriptions will not be filled after 5 pm or on week-ends. 3. You should follow a light diet the first 24 hours after arrival home, such as soup and crackers, etc.  Be sure to include lots of fluids daily.  Resume your normal diet the day after surgery. 4.Most patients will experience some swelling and bruising around the umbilicus or in the groin and scrotum.  Ice packs and reclining will help.  Swelling and bruising can take several days to resolve.  6. It is common to experience some constipation if taking pain medication after surgery.  Increasing fluid intake and taking a stool softener (such as Colace) will usually help or prevent this problem from occurring.  A mild laxative (Milk of Magnesia or Miralax) should be taken according to package directions if there are no bowel movements after 48 hours. 7. Unless discharge instructions indicate otherwise, you may remove your bandages 24-48 hours after surgery, and you may shower at that time.  You may have steri-strips (small skin tapes) in place directly over the incision.  These strips should be left on the skin for 7-10 days.  If your surgeon used skin glue on the  incision, you may shower in 24 hours.  The glue will flake off over the next 2-3 weeks.  Any sutures or staples will be removed at the office during your follow-up visit. 8. ACTIVITIES:  You may resume regular (light) daily activities beginning the next day--such as daily self-care, walking, climbing stairs--gradually increasing activities as tolerated.  You may have sexual intercourse when it is comfortable.  Refrain from any heavy lifting or straining until approved by your doctor.  a.You may drive when you are no longer taking prescription pain medication, you can comfortably wear a seatbelt, and you can safely maneuver your car and apply brakes. b.RETURN TO WORK:   _____________________________________________  9.You should see your doctor in the office for a follow-up appointment approximately 2-3 weeks after your surgery.  Make sure that you call for this appointment within a day or two after you arrive home to insure a convenient appointment time. 10.OTHER INSTRUCTIONS: _No lifting over 15 pounds for 4 weeks  Ice pack, ibuprofen, Tylenol also for pain  Okay to shower starting tomorrow _______________________    _____________________________________  WHEN TO CALL YOUR DOCTOR: 1. Fever over 101.0 2. Inability to urinate 3. Nausea and/or vomiting 4. Extreme swelling or bruising 5. Continued bleeding from incision. 6. Increased pain, redness, or drainage from the incision  The clinic staff is available to answer your questions during regular business hours.  Please dont hesitate to call and ask to speak to one of the nurses for clinical concerns.  If you have a medical emergency, go to the  the nearest emergency room or call 911.  A surgeon from [facility] is always on call at the hospital   [address], [address], [place], [place]  [address] ?  [address] [address], [place], [place]   [address] [phone] ? [phone] ? FAX [phone] Web site:  [email or web address] 

## 2017-02-13 NOTE — Op Note (Signed)
LAPAROSCOPIC BILATERAL INGUINAL HERNIA REPAIR WITH MESH, LAPAROSCOPIC UMBILICAL HERNIA REPAIR  Procedure Note  [name] 02/13/2017   Pre-op Diagnosis: Bilateral inguinal and umbilical hernia     Post-op Diagnosis: Same  Procedure(s): LAPAROSCOPIC BILATERAL INGUINAL HERNIA REPAIR WITH MESH (Bard 3-D max large Prolene mesh on both sides) LAPAROSCOPIC UMBILICAL HERNIA REPAIR  Surgeon(s): Coralie Keens, MD  Anesthesia: General  Staff:  Circulator: Deland Pretty, RN Scrub Person: Derrill Center, RN  Estimated Blood Loss: Minimal               Findings: The patient was found a moderate sized bilateral direct inguinal hernias as well as a left-sided indirect inguinal hernia. The very small umbilical hernia was repaired primarily without mesh. The inguinal hernias were repaired with Bard 3-D max large Prolene mesh on both sides  Procedure: The patient was brought to the operating room and identified as the correct patient. He was placed supine on the operating room table and general anesthesia was induced. His abdomen was prepped and draped in the usual sterile fashion. I made a small transverse incision at the lower edge of the umbilicus. I then carried this down to the fascia which I opened just to the right of the midline. The rectus muscle was then identified and elevated. I passed the dissecting balloon underneath the rectus muscle and manipulated toward the pubis. I then insufflated the dissecting balloon under direct vision dissecting out the preperitoneal space. The dissecting balloon was then removed and insufflation was begun with carbon dioxide. I then placed 2 separate5 mm trochars in the patient's lower midline both under direct vision. I dissected out the right inguinal area first.  The patient had a moderate-sized right direct inguinal hernia without evidence of an indirect inguinal hernia. I then dissected out the left inguinal area. The patient likewise  had a direct hernia as well as an indirect hernia. I was able to dissect the indirect hernia sac away from the cord structures. I then brought 2 separate pieces of Bard 3-D max mesh onto the field. The left side was placed to the umbilical trocar and open as an on lay on the left inguinal floor. It was intact to Cooper's ligament, up the medial abdominal wall, and slightly laterally widely covering the direct defect and cord structures. The right-sided piece of mesh is then placed through the ability trocar as well and open as an on lay on the right inguinal floor. Likewise, it was tacked to Cooper's ligament, the medial abdominal wall, and slightly laterally with the absorbable tacker. Again, wide coverage of the defects and cord structures appear to be achieved bilaterally. Hemostasis appeared to be achieved. I removed both ports under direct vision and the preperitoneal space collapse appropriately. I then removed the trochar at the umbilicus. There was a small umbilical fascial defect which was quite small. I closed it with a figure-of-eight #1 Prolene suture. I then closed the other fascial incision with a figure-of-eight Prolene sutures well. All incisions were then anesthetized with Marcaine. I closed all incisions with 4-0 Monocryl sutures. Bilateral ilioinguinal nerve blocks were also attempted with Marcaine. The patient tolerated procedure well. All the counts were correct at the end of the procedure. The patient was then next abated in the operating room and taken in a stable condition to the recovery room.          [name] A   Date: 02/13/2017  Time: 10:34 AM

## 2017-02-13 NOTE — Assessment & Plan Note (Signed)
Lab Results  Component Value Date   PSA 1.06 02/05/2017   PSA 0.78 01/13/2016   PSA 1.00 01/11/2015   No symptoms

## 2017-02-13 NOTE — Assessment & Plan Note (Signed)
Lab Results  Component Value Date   HGBA1C 5.3 02/05/2017   disc imp of low glycemic diet and wt loss to prevent DM2 Diet info given Exercise as tolerated

## 2017-02-14 ENCOUNTER — Encounter (HOSPITAL_COMMUNITY): Payer: Self-pay | Admitting: Surgery

## 2017-02-24 ENCOUNTER — Other Ambulatory Visit: Payer: Self-pay | Admitting: Family Medicine

## 2017-02-25 NOTE — Telephone Encounter (Signed)
Received refill electronically Last refill 12/26/16 #30/1 Last office visit 02/12/17

## 2017-02-25 NOTE — Telephone Encounter (Signed)
Refill sent to pharmacy as instructed. 

## 2017-02-25 NOTE — Telephone Encounter (Signed)
Please refill for 6 mo 

## 2017-03-06 DIAGNOSIS — Z79899 Other long term (current) drug therapy: Secondary | ICD-10-CM | POA: Diagnosis not present

## 2017-03-06 DIAGNOSIS — Z6832 Body mass index (BMI) 32.0-32.9, adult: Secondary | ICD-10-CM | POA: Diagnosis not present

## 2017-03-06 DIAGNOSIS — I10 Essential (primary) hypertension: Secondary | ICD-10-CM | POA: Diagnosis not present

## 2017-03-06 DIAGNOSIS — Z0000 Encounter for general adult medical examination without abnormal findings: Secondary | ICD-10-CM | POA: Diagnosis not present

## 2017-03-06 DIAGNOSIS — E669 Obesity, unspecified: Secondary | ICD-10-CM | POA: Diagnosis not present

## 2017-03-06 DIAGNOSIS — E7800 Pure hypercholesterolemia, unspecified: Secondary | ICD-10-CM | POA: Diagnosis not present

## 2017-03-29 NOTE — Addendum Note (Signed)
Addendum  created 03/29/17 1354 by Danile Trier, MD   Sign clinical note    

## 2017-04-29 ENCOUNTER — Encounter: Payer: Self-pay | Admitting: Gastroenterology

## 2017-05-17 DIAGNOSIS — R69 Illness, unspecified: Secondary | ICD-10-CM | POA: Diagnosis not present

## 2017-06-04 ENCOUNTER — Encounter (HOSPITAL_COMMUNITY): Payer: Self-pay | Admitting: *Deleted

## 2017-06-19 ENCOUNTER — Ambulatory Visit (AMBULATORY_SURGERY_CENTER): Payer: Self-pay | Admitting: *Deleted

## 2017-06-19 ENCOUNTER — Telehealth: Payer: Self-pay | Admitting: Gastroenterology

## 2017-06-19 VITALS — Ht 69.0 in | Wt 212.0 lb

## 2017-06-19 DIAGNOSIS — Z1211 Encounter for screening for malignant neoplasm of colon: Secondary | ICD-10-CM

## 2017-06-19 MED ORDER — NA SULFATE-K SULFATE-MG SULF 17.5-3.13-1.6 GM/177ML PO SOLN: ORAL | 0 refills | Status: DC

## 2017-06-19 NOTE — Telephone Encounter (Signed)
Spoke with wife. suprep $95. Sample left at front desk 4 th floor for patient. She understands.

## 2017-06-19 NOTE — Progress Notes (Signed)
Patient denies any allergies to eggs or soy. Patient denies any problems with anesthesia/sedation. Patient denies any oxygen use at home and does not take any diet/weight loss medications. EMMI education assisgned to patient on colonoscopy, this was explained and instructions given to patient. 

## 2017-07-03 ENCOUNTER — Encounter: Payer: Medicare HMO | Admitting: Gastroenterology

## 2017-07-25 ENCOUNTER — Encounter: Payer: Self-pay | Admitting: Gastroenterology

## 2017-08-07 ENCOUNTER — Ambulatory Visit (AMBULATORY_SURGERY_CENTER): Payer: Medicare HMO | Admitting: Gastroenterology

## 2017-08-07 ENCOUNTER — Encounter: Payer: Self-pay | Admitting: Gastroenterology

## 2017-08-07 VITALS — BP 121/88 | HR 80 | Temp 98.7°F | Resp 13 | Ht 69.0 in | Wt 212.0 lb

## 2017-08-07 DIAGNOSIS — D120 Benign neoplasm of cecum: Secondary | ICD-10-CM | POA: Diagnosis not present

## 2017-08-07 DIAGNOSIS — Z1211 Encounter for screening for malignant neoplasm of colon: Secondary | ICD-10-CM | POA: Diagnosis present

## 2017-08-07 DIAGNOSIS — I10 Essential (primary) hypertension: Secondary | ICD-10-CM | POA: Diagnosis not present

## 2017-08-07 DIAGNOSIS — D123 Benign neoplasm of transverse colon: Secondary | ICD-10-CM | POA: Diagnosis not present

## 2017-08-07 DIAGNOSIS — Z1212 Encounter for screening for malignant neoplasm of rectum: Secondary | ICD-10-CM | POA: Diagnosis not present

## 2017-08-07 MED ORDER — SODIUM CHLORIDE 0.9 % IV SOLN: 500.0000 mL | INTRAVENOUS | Status: DC

## 2017-08-07 NOTE — Progress Notes (Signed)
Called to room to assist during endoscopic procedure.  Patient ID and intended procedure confirmed with present staff. Received instructions for my participation in the procedure from the performing physician.  

## 2017-08-07 NOTE — Op Note (Signed)
[facility] Patient Name: [name] Procedure Date: 08/07/2017 10:20 AM MRN: [id number] Endoscopist: Remo Lipps P. Salem Mastrogiovanni MD, MD Age: 71 Referring MD:  Date of Birth: October 21, 1946 Gender: Male Account #: [id number] Procedure:                Colonoscopy Indications:              Screening for colorectal malignant neoplasm Medicines:                Monitored Anesthesia Care Procedure:                Pre-Anesthesia Assessment:                           - Prior to the procedure, a History and Physical                            was performed, and patient medications and                            allergies were reviewed. The patient's tolerance of                            previous anesthesia was also reviewed. The risks                            and benefits of the procedure and the sedation                            options and risks were discussed with the patient.                            All questions were answered, and informed consent                            was obtained. Prior Anticoagulants: The patient has                            taken no previous anticoagulant or antiplatelet                            agents. ASA Grade Assessment: II - A patient with                            mild systemic disease. After reviewing the risks                            and benefits, the patient was deemed in                            satisfactory condition to undergo the procedure.                           After obtaining informed consent, the colonoscope  was passed under direct vision. Throughout the                            procedure, the patient's blood pressure, pulse, and                            oxygen saturations were monitored continuously. The                            Colonoscope was introduced through the anus and                            advanced to the the cecum, identified by                            appendiceal  orifice and ileocecal valve. The                            colonoscopy was performed without difficulty. The                            patient tolerated the procedure well. The quality                            of the bowel preparation was adequate. The                            ileocecal valve, appendiceal orifice, and rectum                            were photographed. Scope In: 10:46:20 AM Scope Out: 11:07:59 AM Scope Withdrawal Time: 0 hours 17 minutes 51 seconds  Total Procedure Duration: 0 hours 21 minutes 39 seconds  Findings:                 The perianal and digital rectal examinations were                            normal.                           Two sessile polyps were found in the cecum. The                            polyps were 3 to 4 mm in size. These polyps were                            removed with a cold snare. Resection and retrieval                            were complete.                           A 4 mm polyp was found in the transverse colon. The  polyp was sessile. The polyp was removed with a                            cold snare. Resection and retrieval were complete.                           Many small and large-mouthed diverticula were found                            in the entire colon, highest burden in the left                            colon with hypertrophied / angulated folds.                           Internal hemorrhoids were found during retroflexion.                           Time was taken to lavage the colon to achieve                            adequate views given some residual stool noted on                            this exam. The exam was otherwise without                            abnormality. Complications:            No immediate complications. Estimated blood loss:                            Minimal. Estimated Blood Loss:     Estimated blood loss was minimal. Impression:               - Two 3 to 4  mm polyps in the cecum, removed with a                            cold snare. Resected and retrieved.                           - One 4 mm polyp in the transverse colon, removed                            with a cold snare. Resected and retrieved.                           - Diverticulosis in the entire examined colon.                           - Internal hemorrhoids.                           - The examination was otherwise normal. Recommendation:           -  Patient has a contact number available for                            emergencies. The signs and symptoms of potential                            delayed complications were discussed with the                            patient. Return to normal activities tomorrow.                            Written discharge instructions were provided to the                            patient.                           - Resume previous diet.                           - Continue present medications.                           - Await pathology results.                           - Repeat colonoscopy is recommended for                            surveillance. The colonoscopy date will be                            determined after pathology results from today's                            exam become available for review.                           - No ibuprofen, naproxen, or other non-steroidal                            anti-inflammatory drugs for 2 weeks after polyp                            removal. Remo Lipps P. Luis Mosco MD, MD 08/07/2017 11:13:03 AM This report has been signed electronically.

## 2017-08-07 NOTE — Progress Notes (Signed)
Report to PACU, RN, vss, BBS= Clear.  

## 2017-08-07 NOTE — Patient Instructions (Signed)
**   Handouts given on polyps and diverticulosis** Avoid NSAIDS for two weeks; Tylenol is okay! **  YOU HAD AN ENDOSCOPIC PROCEDURE TODAY AT THE [facility]:   Refer to the procedure report that was given to you for any specific questions about what was found during the examination.  If the procedure report does not answer your questions, please call your gastroenterologist to clarify.  If you requested that your care partner not be given the details of your procedure findings, then the procedure report has been included in a sealed envelope for you to review at your convenience later.  YOU SHOULD EXPECT: Some feelings of bloating in the abdomen. Passage of more gas than usual.  Walking can help get rid of the air that was put into your GI tract during the procedure and reduce the bloating. If you had a lower endoscopy (such as a colonoscopy or flexible sigmoidoscopy) you may notice spotting of blood in your stool or on the toilet paper. If you underwent a bowel prep for your procedure, you may not have a normal bowel movement for a few days.  Please Note:  You might notice some irritation and congestion in your nose or some drainage.  This is from the oxygen used during your procedure.  There is no need for concern and it should clear up in a day or so.  SYMPTOMS TO REPORT IMMEDIATELY:   Following lower endoscopy (colonoscopy or flexible sigmoidoscopy):  Excessive amounts of blood in the stool  Significant tenderness or worsening of abdominal pains  Swelling of the abdomen that is new, acute  Fever of 100F or higher   For urgent or emergent issues, a gastroenterologist can be reached at any hour by calling [phone].   DIET:  We do recommend a small meal at first, but then you may proceed to your regular diet.  Drink plenty of fluids but you should avoid alcoholic beverages for 24 hours.  ACTIVITY:  You should plan to take it easy for the rest of today and you should NOT  DRIVE or use heavy machinery until tomorrow (because of the sedation medicines used during the test).    FOLLOW UP: Our staff will call the number listed on your records the next business day following your procedure to check on you and address any questions or concerns that you may have regarding the information given to you following your procedure. If we do not reach you, we will leave a message.  However, if you are feeling well and you are not experiencing any problems, there is no need to return our call.  We will assume that you have returned to your regular daily activities without incident.  If any biopsies were taken you will be contacted by phone or by letter within the next 1-3 weeks.  Please call us at [phone] if you have not heard about the biopsies in 3 weeks.    SIGNATURES/CONFIDENTIALITY: You and/or your care partner have signed paperwork which will be entered into your electronic medical record.  These signatures attest to the fact that that the information above on your After Visit Summary has been reviewed and is understood.  Full responsibility of the confidentiality of this discharge information lies with you and/or your care-partner.

## 2017-08-08 ENCOUNTER — Telehealth: Payer: Self-pay

## 2017-08-08 NOTE — Telephone Encounter (Signed)
  Follow up Call-  Call back number 08/07/2017  Post procedure Call Back phone  # [phone]  Permission to leave phone message Yes  Some recent data might be hidden     Patient questions:  Do you have a fever, pain , or abdominal swelling? No. Pain Score  0 *  Have you tolerated food without any problems? Yes.    Have you been able to return to your normal activities? Yes.    Do you have any questions about your discharge instructions: Diet   No. Medications  No. Follow up visit  No.  Do you have questions or concerns about your Care? No.  Actions: * If pain score is 4 or above: No action needed, pain <4.

## 2017-08-13 ENCOUNTER — Encounter: Payer: Self-pay | Admitting: Gastroenterology

## 2017-08-17 DIAGNOSIS — R69 Illness, unspecified: Secondary | ICD-10-CM | POA: Diagnosis not present

## 2017-11-21 DIAGNOSIS — R69 Illness, unspecified: Secondary | ICD-10-CM | POA: Diagnosis not present

## 2018-02-05 ENCOUNTER — Telehealth: Payer: Self-pay | Admitting: Family Medicine

## 2018-02-05 DIAGNOSIS — Z125 Encounter for screening for malignant neoplasm of prostate: Secondary | ICD-10-CM

## 2018-02-05 DIAGNOSIS — E7800 Pure hypercholesterolemia, unspecified: Secondary | ICD-10-CM

## 2018-02-05 DIAGNOSIS — R739 Hyperglycemia, unspecified: Secondary | ICD-10-CM

## 2018-02-05 DIAGNOSIS — D696 Thrombocytopenia, unspecified: Secondary | ICD-10-CM

## 2018-02-05 DIAGNOSIS — I10 Essential (primary) hypertension: Secondary | ICD-10-CM

## 2018-02-05 NOTE — Telephone Encounter (Signed)
-----   Message from Eustace Pen, LPN sent at [phone]/73/5329  3:53 PM EDT ----- Regarding: Labs 4/11 Lab orders needed. Thank you.  Insurance:  [employer]

## 2018-02-06 ENCOUNTER — Ambulatory Visit (INDEPENDENT_AMBULATORY_CARE_PROVIDER_SITE_OTHER): Payer: Medicare HMO

## 2018-02-06 ENCOUNTER — Ambulatory Visit: Payer: Medicare HMO

## 2018-02-06 VITALS — BP 130/70 | HR 51 | Temp 97.8°F | Ht 68.5 in | Wt 207.2 lb

## 2018-02-06 DIAGNOSIS — Z125 Encounter for screening for malignant neoplasm of prostate: Secondary | ICD-10-CM

## 2018-02-06 DIAGNOSIS — D696 Thrombocytopenia, unspecified: Secondary | ICD-10-CM | POA: Diagnosis not present

## 2018-02-06 DIAGNOSIS — I10 Essential (primary) hypertension: Secondary | ICD-10-CM

## 2018-02-06 DIAGNOSIS — E7800 Pure hypercholesterolemia, unspecified: Secondary | ICD-10-CM

## 2018-02-06 DIAGNOSIS — Z0000 Encounter for general adult medical examination without abnormal findings: Secondary | ICD-10-CM | POA: Diagnosis not present

## 2018-02-06 DIAGNOSIS — R739 Hyperglycemia, unspecified: Secondary | ICD-10-CM | POA: Diagnosis not present

## 2018-02-06 LAB — COMPREHENSIVE METABOLIC PANEL
ALT: 22 U/L (ref 0–53)
AST: 20 U/L (ref 0–37)
Albumin: 4.7 g/dL (ref 3.5–5.2)
Alkaline Phosphatase: 59 U/L (ref 39–117)
BUN: 18 mg/dL (ref 6–23)
CHLORIDE: 105 meq/L (ref 96–112)
CO2: 27 mEq/L (ref 19–32)
CREATININE: 0.84 mg/dL (ref 0.40–1.50)
Calcium: 9.5 mg/dL (ref 8.4–10.5)
GFR: 96.16 mL/min (ref >60.00)
Glucose, Bld: 118 mg/dL — ABNORMAL HIGH (ref 70–99)
POTASSIUM: 5.2 meq/L — AB (ref 3.5–5.1)
SODIUM: 139 meq/L (ref 135–145)
Total Bilirubin: 1.3 mg/dL — ABNORMAL HIGH (ref 0.2–1.2)
Total Protein: 7.1 g/dL (ref 6.0–8.3)

## 2018-02-06 LAB — CBC WITH DIFFERENTIAL/PLATELET
BASOS PCT: 0.7 % (ref 0.0–3.0)
Basophils Absolute: 0 10*3/uL (ref 0.0–0.1)
EOS ABS: 0.3 10*3/uL (ref 0.0–0.7)
EOS PCT: 5.1 % — AB (ref 0.0–5.0)
HEMATOCRIT: 43.9 % (ref 39.0–52.0)
HEMOGLOBIN: 15.4 g/dL (ref 13.0–17.0)
Lymphocytes Relative: 44.2 % (ref 12.0–46.0)
Lymphs Abs: 2.5 10*3/uL (ref 0.7–4.0)
MCHC: 35.1 g/dL (ref 30.0–36.0)
MCV: 92.3 fl (ref 78.0–100.0)
MONO ABS: 0.6 10*3/uL (ref 0.1–1.0)
Monocytes Relative: 9.7 % (ref 3.0–12.0)
NEUTROS ABS: 2.3 10*3/uL (ref 1.4–7.7)
Neutrophils Relative %: 40.3 % — ABNORMAL LOW (ref 43.0–77.0)
PLATELETS: 133 10*3/uL — AB (ref 150.0–400.0)
RBC: 4.76 Mil/uL (ref 4.22–5.81)
RDW: 13.5 % (ref 11.5–15.5)
WBC: 5.7 10*3/uL (ref 4.0–10.5)

## 2018-02-06 LAB — TSH: TSH: 3.45 u[IU]/mL (ref 0.35–4.50)

## 2018-02-06 LAB — LIPID PANEL
CHOL/HDL RATIO: 3
Cholesterol: 211 mg/dL — ABNORMAL HIGH (ref 0–200)
HDL: 65.8 mg/dL (ref >39.00)
LDL CALC: 130 mg/dL — AB (ref 0–99)
NonHDL: 145.48
Triglycerides: 77 mg/dL (ref 0.0–149.0)
VLDL: 15.4 mg/dL (ref 0.0–40.0)

## 2018-02-06 LAB — HEMOGLOBIN A1C: HEMOGLOBIN A1C: 5.2 % (ref 4.6–6.5)

## 2018-02-06 LAB — PSA, MEDICARE: PSA: 1.43 ng/ml (ref 0.10–4.00)

## 2018-02-06 NOTE — Patient Instructions (Signed)
[name] , Thank you for taking time to come for your Medicare Wellness Visit. I appreciate your ongoing commitment to your health goals. Please review the following plan we discussed and let me know if I can assist you in the future.   These are the goals we discussed: Goals    . Increase physical activity     Starting 02/06/2018, I will continue to exercise for at least 2 hours daily.        This is a list of the screening recommended for you and due dates:  Health Maintenance  Topic Date Due  . Flu Shot  05/29/2018  . Colon Cancer Screening  08/07/2020  . Tetanus Vaccine  01/13/2023  .  Hepatitis C: One time screening is recommended by Center for Disease Control  (CDC) for  adults born from [birth year] through 1963.   Completed  . Pneumonia vaccines  Completed   Preventive Care for Adults  A healthy lifestyle and preventive care can promote health and wellness. Preventive health guidelines for adults include the following key practices.  . A routine yearly physical is a good way to check with your health care provider about your health and preventive screening. It is a chance to share any concerns and updates on your health and to receive a thorough exam.  . Visit your dentist for a routine exam and preventive care every 6 months. Brush your teeth twice a day and floss once a day. Good oral hygiene prevents tooth decay and gum disease.  . The frequency of eye exams is based on your age, health, family medical history, use  of contact lenses, and other factors. Follow your health care provider's recommendations for frequency of eye exams.  . Eat a healthy diet. Foods like vegetables, fruits, whole grains, low-fat dairy products, and lean protein foods contain the nutrients you need without too many calories. Decrease your intake of foods high in solid fats, added sugars, and salt. Eat the right amount of calories for you. Get information about a proper diet from your health care  provider, if necessary.  . Regular physical exercise is one of the most important things you can do for your health. Most adults should get at least 150 minutes of moderate-intensity exercise (any activity that increases your heart rate and causes you to sweat) each week. In addition, most adults need muscle-strengthening exercises on 2 or more days a week.  Silver Sneakers may be a benefit available to you. To determine eligibility, you may visit the website: www.silversneakers.com or contact program at 224-426-4384 Mon-Fri between 8AM-8PM.   . Maintain a healthy weight. The body mass index (BMI) is a screening tool to identify possible weight problems. It provides an estimate of body fat based on height and weight. Your health care provider can find your BMI and can help you achieve or maintain a healthy weight.   For adults 20 years and older: ? A BMI below 18.5 is considered underweight. ? A BMI of 18.5 to 24.9 is normal. ? A BMI of 25 to 29.9 is considered overweight. ? A BMI of 30 and above is considered obese.   . Maintain normal blood lipids and cholesterol levels by exercising and minimizing your intake of saturated fat. Eat a balanced diet with plenty of fruit and vegetables. Blood tests for lipids and cholesterol should begin at age 89 and be repeated every 5 years. If your lipid or cholesterol levels are high, you are over 50, or you are  at high risk for heart disease, you may need your cholesterol levels checked more frequently. Ongoing high lipid and cholesterol levels should be treated with medicines if diet and exercise are not working.  . If you smoke, find out from your health care provider how to quit. If you do not use tobacco, please do not start.  . If you choose to drink alcohol, please do not consume more than 2 drinks per day. One drink is considered to be 12 ounces (355 mL) of beer, 5 ounces (148 mL) of wine, or 1.5 ounces (44 mL) of liquor.  . If you are 24-79 years  old, ask your health care provider if you should take aspirin to prevent strokes.  . Use sunscreen. Apply sunscreen liberally and repeatedly throughout the day. You should seek shade when your shadow is shorter than you. Protect yourself by wearing long sleeves, pants, a wide-brimmed hat, and sunglasses year round, whenever you are outdoors.  . Once a month, do a whole body skin exam, using a mirror to look at the skin on your back. Tell your health care provider of new moles, moles that have irregular borders, moles that are larger than a pencil eraser, or moles that have changed in shape or color.

## 2018-02-06 NOTE — Progress Notes (Signed)
Subjective:   [name] is a 72 y.o. male who presents for Medicare Annual/Subsequent preventive examination.  Review of Systems:  N/A Cardiac Risk Factors include: advanced age (>109men, >12 women);male gender;obesity (BMI >30kg/m2);dyslipidemia     Objective:    Vitals: BP 130/70 (BP Location: Right Arm, Patient Position: Sitting, Cuff Size: Normal)   Pulse (!) 51   Temp 97.8 F (36.6 C) (Oral)   Ht 5' 8.5" (1.74 m) Comment: no shoes  Wt 207 lb 4 oz (94 kg)   SpO2 98%   BMI 31.05 kg/m   Body mass index is 31.05 kg/m.  Advanced Directives 02/06/2018 08/07/2017 06/19/2017 02/05/2017  Does Patient Have a Medical Advance Directive? Yes Yes Yes No  Type of Advance Directive Living will [place];Living will Living will;Healthcare Power of Attorney -    Tobacco Social History   Tobacco Use  Smoking Status Never Smoker  Smokeless Tobacco Never Used     Counseling given: No   Clinical Intake:  Pre-visit preparation completed: Yes  Pain : No/denies pain Pain Score: 0-No pain     Nutritional Status: BMI > 30  Obese Nutritional Risks: None Diabetes: No  How often do you need to have someone help you when you read instructions, pamphlets, or other written materials from your doctor or pharmacy?: 1 - Never What is the last grade level you completed in school?: 12th grade + some college  Interpreter Needed?: No  Comments: pt lives with spouse Information entered by :: LPinson, LPN  Past Medical History:  Diagnosis Date  . Arthritis   . Back pain   . Diverticulosis   . Dyspnea    with exertion  . Heart murmur   . History of colon polyps   . Hyperlipidemia   . Hypertension    Past Surgical History:  Procedure Laterality Date  . COLONOSCOPY    . INGUINAL HERNIA REPAIR Bilateral 02/13/2017   Procedure: LAPAROSCOPIC BILATERAL INGUINAL HERNIA REPAIR WITH MESH;  Surgeon: Coralie Keens, MD;  Location: [facility];  Service: General;   Laterality: Bilateral;  . LASIK    . UMBILICAL HERNIA REPAIR N/A 02/13/2017   Procedure: LAPAROSCOPIC UMBILICAL HERNIA REPAIR;  Surgeon: Coralie Keens, MD;  Location: [facility];  Service: General;  Laterality: N/A;   Family History  Problem Relation Age of Onset  . Cancer Mother        brain tumor  . Emphysema Father   . Diabetes Father   . HIV Brother   . Colon cancer Neg Hx    Social History   Socioeconomic History  . Marital status: Married    Spouse name: Not on file  . Number of children: 1  . Years of education: Not on file  . Highest education level: Not on file  Occupational History    Employer: [employer]  . Financial resource strain: Not on file  . Food insecurity:    Worry: Not on file    Inability: Not on file  . Transportation needs:    Medical: Not on file    Non-medical: Not on file  Tobacco Use  . Smoking status: Never Smoker  . Smokeless tobacco: Never Used  Substance and Sexual Activity  . Alcohol use: Yes    Alcohol/week: 12.6 - 16.8 oz    Types: 21 - 28 Cans of beer per week    Comment: 3-4 cans of beer daily  . Drug use: No  . Sexual activity: Yes  Lifestyle  . Physical activity:    Days per week: Not on file    Minutes per session: Not on file  . Stress: Not on file  Relationships  . Social connections:    Talks on phone: Not on file    Gets together: Not on file    Attends religious service: Not on file    Active member of club or organization: Not on file    Attends meetings of clubs or organizations: Not on file    Relationship status: Not on file  Other Topics Concern  . Not on file  Social History Narrative  . Not on file    Outpatient Encounter Medications as of 02/06/2018  Medication Sig  . amLODipine (NORVASC) 5 MG tablet TAKE 1 TABLET BY MOUTH EVERY DAY IN THE EVEING  . atorvastatin (LIPITOR) 40 MG tablet Take 1 tablet (40 mg total) by mouth every evening.  . Cyanocobalamin (B-12 PO) Take 1 tablet by mouth  daily.  Marland Kitchen doxylamine, Sleep, (UNISOM) 25 MG tablet Take 25 mg by mouth at bedtime.  Marland Kitchen ibuprofen (ADVIL,MOTRIN) 200 MG tablet Take 400 mg by mouth 2 (two) times daily as needed for headache or moderate pain.   No facility-administered encounter medications on file as of 02/06/2018.     Activities of Daily Living In your present state of health, do you have any difficulty performing the following activities: 02/06/2018  Hearing? N  Vision? N  Difficulty concentrating or making decisions? N  Walking or climbing stairs? N  Dressing or bathing? N  Doing errands, shopping? N  Preparing Food and eating ? N  Using the Toilet? N  In the past six months, have you accidently leaked urine? N  Do you have problems with loss of bowel control? N  Managing your Medications? N  Managing your Finances? N  Housekeeping or managing your Housekeeping? N  Some recent data might be hidden    Patient Care Team: Tower, Wynelle Fanny, MD as PCP - General   Assessment:   This is a routine wellness examination for Tylar.  Exercise Activities and Dietary recommendations Current Exercise Habits: Home exercise routine, Type of exercise: strength training/weights;Other - see comments;walking(hiking, kayaking), Time (Minutes): > 60, Frequency (Times/Week): 7, Weekly Exercise (Minutes/Week): 0, Intensity: Moderate, Exercise limited by: None identified  Goals    . Increase physical activity     Starting 02/06/2018, I will continue to exercise for at least 2 hours daily.        Fall Risk Fall Risk  02/06/2018 02/05/2017 01/20/2016 01/18/2015 01/13/2014  Falls in the past year? No No Yes Yes No  Number falls in past yr: - - 1 2 or more -  Injury with Fall? - - No No -   Depression Screen PHQ 2/9 Scores 02/06/2018 02/05/2017 01/20/2016 01/18/2015  PHQ - 2 Score 0 0 0 0  PHQ- 9 Score 0 - - -    Cognitive Function MMSE - Mini Mental State Exam 02/06/2018 02/05/2017  Orientation to time 5 5  Orientation to Place 5 5    Registration 3 3  Attention/ Calculation 0 0  Recall 3 2  Recall-comments - pt was unable to recall 1 of 3  words  Language- name 2 objects 0 0  Language- repeat 1 1  Language- follow 3 step command 3 3  Language- read & follow direction 0 0  Write a sentence 0 0  Copy design 0 0  Total score 20 19  PLEASE NOTE: A Mini-Cog screen was completed. Maximum score is 20. A value of 0 denotes this part of Folstein MMSE was not completed or the patient failed this part of the Mini-Cog screening.   Mini-Cog Screening Orientation to Time - Max 5 pts Orientation to Place - Max 5 pts Registration - Max 3 pts Recall - Max 3 pts Language Repeat - Max 1 pts Language Follow 3 Step Command - Max 3 pts     Immunization History  Administered Date(s) Administered  . Influenza-Unspecified 08/29/2017  . Pneumococcal Conjugate-13 01/18/2015  . Pneumococcal Polysaccharide-23 01/20/2016  . Td 11/11/2002  . Tdap 01/12/2013    Screening Tests Health Maintenance  Topic Date Due  . INFLUENZA VACCINE  05/29/2018  . COLONOSCOPY  08/07/2020  . TETANUS/TDAP  01/13/2023  . Hepatitis C Screening  Completed  . PNA vac Low Risk Adult  Completed      Plan:     I have personally reviewed, addressed, and noted the following in the patient's chart:  A. Medical and social history B. Use of alcohol, tobacco or illicit drugs  C. Current medications and supplements D. Functional ability and status E.  Nutritional status F.  Physical activity G. Advance directives H. List of other physicians I.  Hospitalizations, surgeries, and ER visits in previous 12 months J.  [place] to include hearing, vision, cognitive, depression L. Referrals and appointments - none  In addition, I have reviewed and discussed with patient certain preventive protocols, quality metrics, and best practice recommendations. A written personalized care plan for preventive services as well as general preventive health  recommendations were provided to patient.  See attached scanned questionnaire for additional information.   Signed,   Lindell Noe, MHA, BS, LPN Health Coach

## 2018-02-06 NOTE — Progress Notes (Signed)
PCP notes:   Health maintenance:  No gaps identified.  Abnormal screenings:   Hearing - failed  Hearing Screening   125Hz  250Hz  500Hz  1000Hz  2000Hz  3000Hz  4000Hz  6000Hz  8000Hz   Right ear:   40 40 40  0    Left ear:   40 40 40  40     Patient concerns:   None  Nurse concerns:  None  Next PCP appt:   02/24/2018 @ 0930  I reviewed health advisor's note, was available for consultation, and agree with documentation and plan. Loura Pardon MD

## 2018-02-18 ENCOUNTER — Other Ambulatory Visit: Payer: Medicare HMO

## 2018-02-24 ENCOUNTER — Ambulatory Visit (INDEPENDENT_AMBULATORY_CARE_PROVIDER_SITE_OTHER): Payer: Medicare HMO | Admitting: Family Medicine

## 2018-02-24 ENCOUNTER — Encounter: Payer: Self-pay | Admitting: Family Medicine

## 2018-02-24 VITALS — BP 135/82 | HR 71 | Temp 98.5°F | Ht 68.5 in | Wt 212.8 lb

## 2018-02-24 DIAGNOSIS — E7800 Pure hypercholesterolemia, unspecified: Secondary | ICD-10-CM

## 2018-02-24 DIAGNOSIS — R011 Cardiac murmur, unspecified: Secondary | ICD-10-CM | POA: Diagnosis not present

## 2018-02-24 DIAGNOSIS — Z125 Encounter for screening for malignant neoplasm of prostate: Secondary | ICD-10-CM

## 2018-02-24 DIAGNOSIS — I10 Essential (primary) hypertension: Secondary | ICD-10-CM | POA: Diagnosis not present

## 2018-02-24 DIAGNOSIS — Z0000 Encounter for general adult medical examination without abnormal findings: Secondary | ICD-10-CM

## 2018-02-24 DIAGNOSIS — R739 Hyperglycemia, unspecified: Secondary | ICD-10-CM | POA: Diagnosis not present

## 2018-02-24 DIAGNOSIS — D696 Thrombocytopenia, unspecified: Secondary | ICD-10-CM

## 2018-02-24 MED ORDER — ATORVASTATIN CALCIUM 40 MG PO TABS: 40.0000 mg | ORAL_TABLET | Freq: Every evening | ORAL | 3 refills | Status: DC

## 2018-02-24 MED ORDER — AMLODIPINE BESYLATE 5 MG PO TABS: ORAL_TABLET | ORAL | 3 refills | Status: DC

## 2018-02-24 NOTE — Assessment & Plan Note (Signed)
Lab Results  Component Value Date   PSA 1.43 02/06/2018   PSA 1.06 02/05/2017   PSA 0.78 01/13/2016   No change in voiding habits

## 2018-02-24 NOTE — Progress Notes (Signed)
Subjective:    Patient ID: [name], male    DOB: 1945/11/07, 72 y.o.   MRN: [id number]  HPI  Here for health maintenance exam and to review chronic medical problems   Doing well   Had hernia surgery- and it went well   Wt Readings from Last 3 Encounters:  02/24/18 212 lb 12 oz (96.5 kg)  02/06/18 207 lb 4 oz (94 kg)  08/07/17 212 lb (96.2 kg)  diet is fair-getting better  Exercise - kayak, gym / hiking (a lot of walking and some core exercise) 31.88 kg/m    Had amw 4/11 Missed 4000 Hz in R ear only -pt does not notice  No concerns   Colonoscopy 10/18 with 3 year recall , polyps   Prostate health  Lab Results  Component Value Date   PSA 1.43 02/06/2018   PSA 1.06 02/05/2017   PSA 0.78 01/13/2016   no real changes in urination from past  occ nocturia times 1 /other times none  No frequency/urgency or flow changes    Zoster status = has not checked on shingrix   bp is up on first check today No cp or palpitations or headaches or edema  No side effects to medicines  BP Readings from Last 3 Encounters:  02/24/18 (!) 146/92  02/06/18 130/70  08/07/17 121/88      Hx of thrombocytopenia Lab Results  Component Value Date   WBC 5.7 02/06/2018   HGB 15.4 02/06/2018   HCT 43.9 02/06/2018   MCV 92.3 02/06/2018   PLT 133.0 (L) 02/06/2018   no excess bruising or bleeding    Hyperlipidemia  Lab Results  Component Value Date   CHOL 211 (H) 02/06/2018   CHOL 234 (H) 02/05/2017   CHOL 210 (H) 04/23/2016   Lab Results  Component Value Date   HDL 65.80 02/06/2018   HDL 61.10 02/05/2017   HDL 61.80 04/23/2016   Lab Results  Component Value Date   LDLCALC 130 (H) 02/06/2018   LDLCALC 155 (H) 02/05/2017   LDLCALC 130 (H) 04/23/2016   Lab Results  Component Value Date   TRIG 77.0 02/06/2018   TRIG 89.0 02/05/2017   TRIG 91.0 04/23/2016   Lab Results  Component Value Date   CHOLHDL 3 02/06/2018   CHOLHDL 4 02/05/2017   CHOLHDL 3 04/23/2016    Lab Results  Component Value Date   LDLDIRECT 130.0 01/06/2013   LDLDIRECT 143.1 10/01/2011   LDLDIRECT 139.4 04/05/2010  atorvastatin and diet  He is eating better overall - less fast food  Still some red meat   Hyperglycemia  Lab Results  Component Value Date   HGBA1C 5.2 02/06/2018   Down from 5.3  Doing well with carbs and sugar   Lab Results  Component Value Date   CREATININE 0.84 02/06/2018   BUN 18 02/06/2018   NA 139 02/06/2018   K 5.2 (H) 02/06/2018   CL 105 02/06/2018   CO2 27 02/06/2018   Lab Results  Component Value Date   ALT 22 02/06/2018   AST 20 02/06/2018   ALKPHOS 59 02/06/2018   BILITOT 1.3 (H) 02/06/2018    Lab Results  Component Value Date   TSH 3.45 02/06/2018     Patient Active Problem List   Diagnosis Date Noted  . Colon cancer screening 02/12/2017  . Knee pain, right 12/26/2016  . Routine general medical examination at a health care facility 01/20/2016  . Hyperglycemia 01/20/2016  . Heart murmur, systolic 77/82/4235  .  Encounter for Medicare annual wellness exam 09/24/2011  . Prostate cancer screening 09/24/2011  . Osteoarthritis, knee 02/14/2011  . TINNITUS 12/09/2009  . BACK PAIN, LUMBAR, WITH RADICULOPATHY 10/10/2009  . ESSENTIAL HYPERTENSION, BENIGN 08/16/2009  . Thrombocytopenia ([place]) 02/15/2009  . Hyperlipidemia 01/07/2008   Past Medical History:  Diagnosis Date  . Arthritis   . Back pain   . Diverticulosis   . Dyspnea    with exertion  . Heart murmur   . History of colon polyps   . Hyperlipidemia   . Hypertension    Past Surgical History:  Procedure Laterality Date  . COLONOSCOPY    . INGUINAL HERNIA REPAIR Bilateral 02/13/2017   Procedure: LAPAROSCOPIC BILATERAL INGUINAL HERNIA REPAIR WITH MESH;  Surgeon: Coralie Keens, MD;  Location: [facility];  Service: General;  Laterality: Bilateral;  . LASIK    . UMBILICAL HERNIA REPAIR N/A 02/13/2017   Procedure: LAPAROSCOPIC UMBILICAL HERNIA REPAIR;  Surgeon: Coralie Keens, MD;  Location: [facility];  Service: General;  Laterality: N/A;   Social History   Tobacco Use  . Smoking status: Never Smoker  . Smokeless tobacco: Never Used  Substance Use Topics  . Alcohol use: Yes    Alcohol/week: 12.6 - 16.8 oz    Types: 21 - 28 Cans of beer per week    Comment: 3-4 cans of beer daily  . Drug use: No   Family History  Problem Relation Age of Onset  . Cancer Mother        brain tumor  . Emphysema Father   . Diabetes Father   . HIV Brother   . Colon cancer Neg Hx    Allergies  Allergen Reactions  . Ezetimibe-Simvastatin     REACTION: back pain   Current Outpatient Medications on File Prior to Visit  Medication Sig Dispense Refill  . Cyanocobalamin (B-12 PO) Take 1 tablet by mouth daily.    Marland Kitchen doxylamine, Sleep, (UNISOM) 25 MG tablet Take 25 mg by mouth at bedtime.    Marland Kitchen ibuprofen (ADVIL,MOTRIN) 200 MG tablet Take 400 mg by mouth 2 (two) times daily as needed for headache or moderate pain.     No current facility-administered medications on file prior to visit.     Review of Systems  Constitutional: Negative for activity change, appetite change, fatigue, fever and unexpected weight change.  HENT: Negative for congestion, rhinorrhea, sore throat and trouble swallowing.   Eyes: Negative for pain, redness, itching and visual disturbance.  Respiratory: Negative for cough, chest tightness, shortness of breath and wheezing.   Cardiovascular: Negative for chest pain and palpitations.  Gastrointestinal: Negative for abdominal pain, blood in stool, constipation, diarrhea and nausea.  Endocrine: Negative for cold intolerance, heat intolerance, polydipsia and polyuria.  Genitourinary: Negative for difficulty urinating, dysuria, frequency and urgency.  Musculoskeletal: Negative for arthralgias, joint swelling and myalgias.       R knee is occ stiff   Skin: Negative for pallor and rash.  Neurological: Negative for dizziness, tremors, weakness, numbness and  headaches.  Hematological: Negative for adenopathy. Does not bruise/bleed easily.  Psychiatric/Behavioral: Negative for decreased concentration and dysphoric mood. The patient is not nervous/anxious.        Objective:   Physical Exam  Constitutional: He appears well-developed and well-nourished. No distress.  overwt and well app  HENT:  Head: Normocephalic and atraumatic.  Right Ear: External ear normal.  Left Ear: External ear normal.  Nose: Nose normal.  Mouth/Throat: Oropharynx is clear and moist.  Eyes: Pupils are  equal, round, and reactive to light. Conjunctivae and EOM are normal. Right eye exhibits no discharge. Left eye exhibits no discharge. No scleral icterus.  Neck: Normal range of motion. Neck supple. No JVD present. Carotid bruit is not present. No thyromegaly present.  Cardiovascular: Normal rate, regular rhythm and intact distal pulses. Exam reveals no gallop.  Murmur heard. Baseline systolic murmur  Pulmonary/Chest: Effort normal and breath sounds normal. No respiratory distress. He has no wheezes. He exhibits no tenderness.  Abdominal: Soft. Bowel sounds are normal. He exhibits no distension, no abdominal bruit and no mass. There is no tenderness.  Musculoskeletal: He exhibits no edema or tenderness.  Lymphadenopathy:    He has no cervical adenopathy.  Neurological: He is alert. He has normal reflexes. No cranial nerve deficit. He exhibits normal muscle tone. Coordination normal.  Skin: Skin is warm and dry. No rash noted. No erythema. No pallor.  Mild sunburn - trunk and extremities  No blisters Solar lentigines diffusely   Toe nails are thickened but well trimmed   Psychiatric: He has a normal mood and affect.  Pleasant           Assessment & Plan:   Problem List Items Addressed This Visit      Cardiovascular and Mediastinum   ESSENTIAL HYPERTENSION, BENIGN    bp in fair control at this time  BP Readings from Last 1 Encounters:  02/24/18 135/82    No changes needed Disc lifstyle change with low sodium diet and exercise  Labs reviewed  Great exercise       Relevant Medications   amLODipine (NORVASC) 5 MG tablet   atorvastatin (LIPITOR) 40 MG tablet     Other   Heart murmur, systolic    No changes  Rev last echo - some mild thickening of aortic leaflets but no stenosis or regurg  Continue to watch        Hyperglycemia    Lab Results  Component Value Date   HGBA1C 5.2 02/06/2018   Good control  disc imp of low glycemic diet and wt loss to prevent DM2       Hyperlipidemia    Disc goals for lipids and reasons to control them Rev labs with pt Rev low sat fat diet in detail Atorvastatin and diet  LDL dec to 130 with less fast food  Recommend reducing red meat also      Relevant Medications   amLODipine (NORVASC) 5 MG tablet   atorvastatin (LIPITOR) 40 MG tablet   Prostate cancer screening    Lab Results  Component Value Date   PSA 1.43 02/06/2018   PSA 1.06 02/05/2017   PSA 0.78 01/13/2016   No change in voiding habits       Routine general medical examination at a health care facility - Primary    Reviewed health habits including diet and exercise and skin cancer prevention Reviewed appropriate screening tests for age  Also reviewed health mt list, fam hx and immunization status , as well as social and family history   See HPI Labs reviewed amw reviewed  Urged to keep up exercise  Wear sunscreen more regularly  bp better on 2nd check       Thrombocytopenia ([facility])    Platelet ct 133  No bruising or bleeding  Continue to follow

## 2018-02-24 NOTE — Assessment & Plan Note (Signed)
Lab Results  Component Value Date   HGBA1C 5.2 02/06/2018   Good control  disc imp of low glycemic diet and wt loss to prevent DM2

## 2018-02-24 NOTE — Assessment & Plan Note (Signed)
Disc goals for lipids and reasons to control them Rev labs with pt Rev low sat fat diet in detail Atorvastatin and diet  LDL dec to 130 with less fast food  Recommend reducing red meat also

## 2018-02-24 NOTE — Assessment & Plan Note (Signed)
Platelet ct 133  No bruising or bleeding  Continue to follow

## 2018-02-24 NOTE — Assessment & Plan Note (Signed)
No changes  Rev last echo - some mild thickening of aortic leaflets but no stenosis or regurg  Continue to watch

## 2018-02-24 NOTE — Assessment & Plan Note (Signed)
bp in fair control at this time  BP Readings from Last 1 Encounters:  02/24/18 135/82   No changes needed Disc lifstyle change with low sodium diet and exercise  Labs reviewed  Great exercise

## 2018-02-24 NOTE — Patient Instructions (Addendum)
If you are interested in the new shingles vaccine (Shingrix) - call your local pharmacy to check on coverage and availability  If you want one - get on a wait list at [facility]   For cholesterol  Avoid red meat/ fried foods/ egg yolks/ fatty breakfast meats/ butter, cheese and high fat dairy/ and shellfish    For blood sugar  Try to get most of your carbohydrates from produce (with the exception of white potatoes)  Eat less bread/pasta/rice/snack foods/cereals/sweets and other items from the middle of the grocery store (processed carbs)

## 2018-02-24 NOTE — Assessment & Plan Note (Signed)
Reviewed health habits including diet and exercise and skin cancer prevention Reviewed appropriate screening tests for age  Also reviewed health mt list, fam hx and immunization status , as well as social and family history   See HPI Labs reviewed amw reviewed  Urged to keep up exercise  Wear sunscreen more regularly  bp better on 2nd check

## 2018-03-04 DIAGNOSIS — G4700 Insomnia, unspecified: Secondary | ICD-10-CM | POA: Diagnosis not present

## 2018-03-04 DIAGNOSIS — M1990 Unspecified osteoarthritis, unspecified site: Secondary | ICD-10-CM | POA: Diagnosis not present

## 2018-03-04 DIAGNOSIS — Z825 Family history of asthma and other chronic lower respiratory diseases: Secondary | ICD-10-CM | POA: Diagnosis not present

## 2018-03-04 DIAGNOSIS — I10 Essential (primary) hypertension: Secondary | ICD-10-CM | POA: Diagnosis not present

## 2018-03-04 DIAGNOSIS — Z809 Family history of malignant neoplasm, unspecified: Secondary | ICD-10-CM | POA: Diagnosis not present

## 2018-03-04 DIAGNOSIS — Z6831 Body mass index (BMI) 31.0-31.9, adult: Secondary | ICD-10-CM | POA: Diagnosis not present

## 2018-03-04 DIAGNOSIS — E669 Obesity, unspecified: Secondary | ICD-10-CM | POA: Diagnosis not present

## 2018-03-04 DIAGNOSIS — I499 Cardiac arrhythmia, unspecified: Secondary | ICD-10-CM | POA: Diagnosis not present

## 2018-03-04 DIAGNOSIS — E785 Hyperlipidemia, unspecified: Secondary | ICD-10-CM | POA: Diagnosis not present

## 2018-03-04 DIAGNOSIS — Z791 Long term (current) use of non-steroidal anti-inflammatories (NSAID): Secondary | ICD-10-CM | POA: Diagnosis not present

## 2018-03-20 DIAGNOSIS — R69 Illness, unspecified: Secondary | ICD-10-CM | POA: Diagnosis not present

## 2018-07-05 IMAGING — DX DG KNEE AP/LAT W/ SUNRISE*R*
4 series · 4 of 4 positions shown · non-contrast
Comparison: No recent prior.

CLINICAL DATA: Knee pain.

EXAM:
RIGHT KNEE 3 VIEWS

[knee ap]
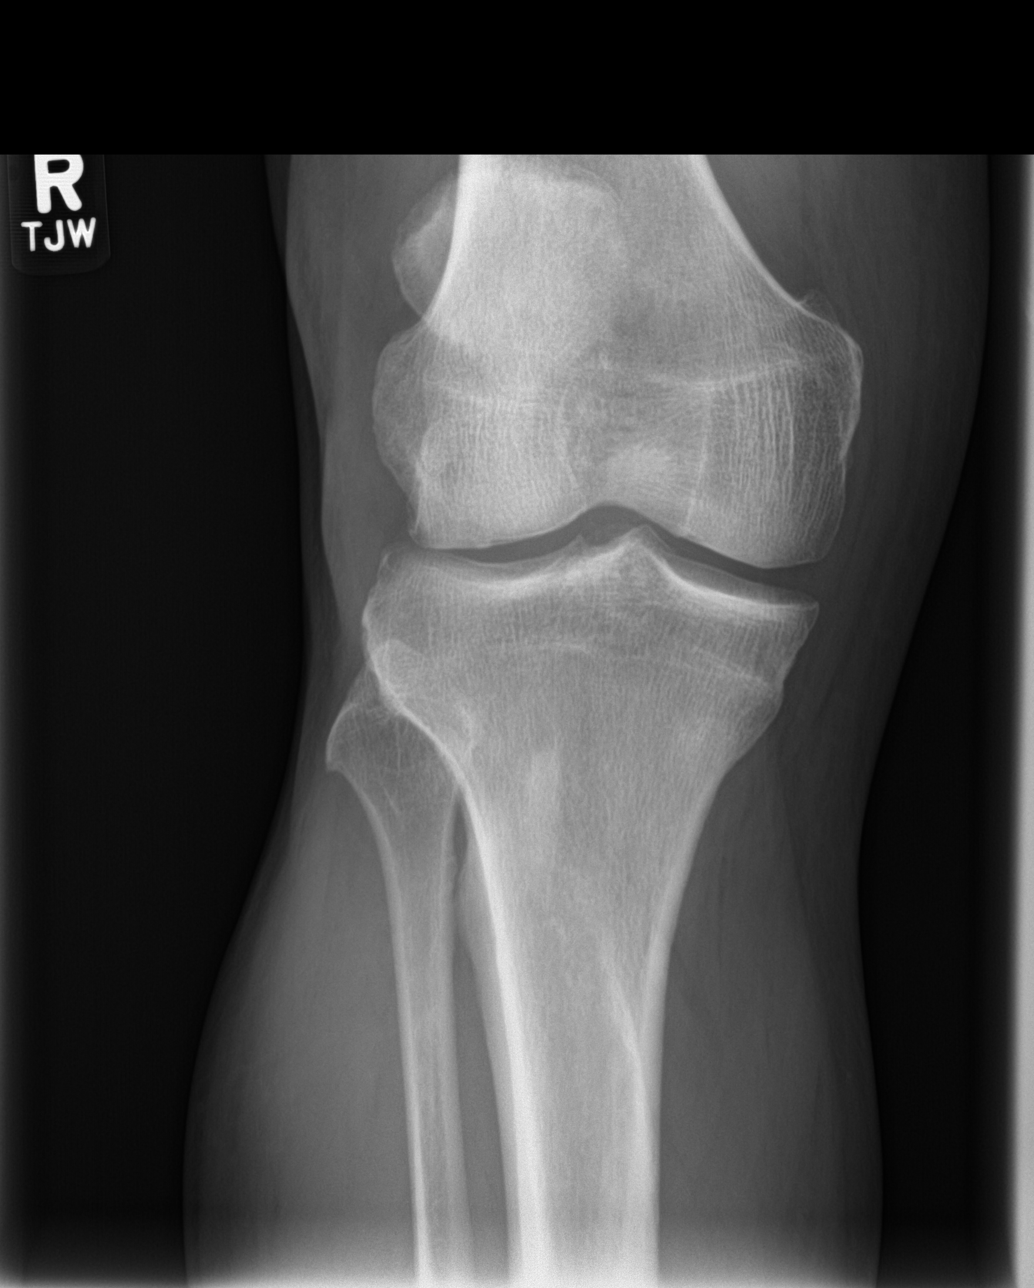

[knee lat]
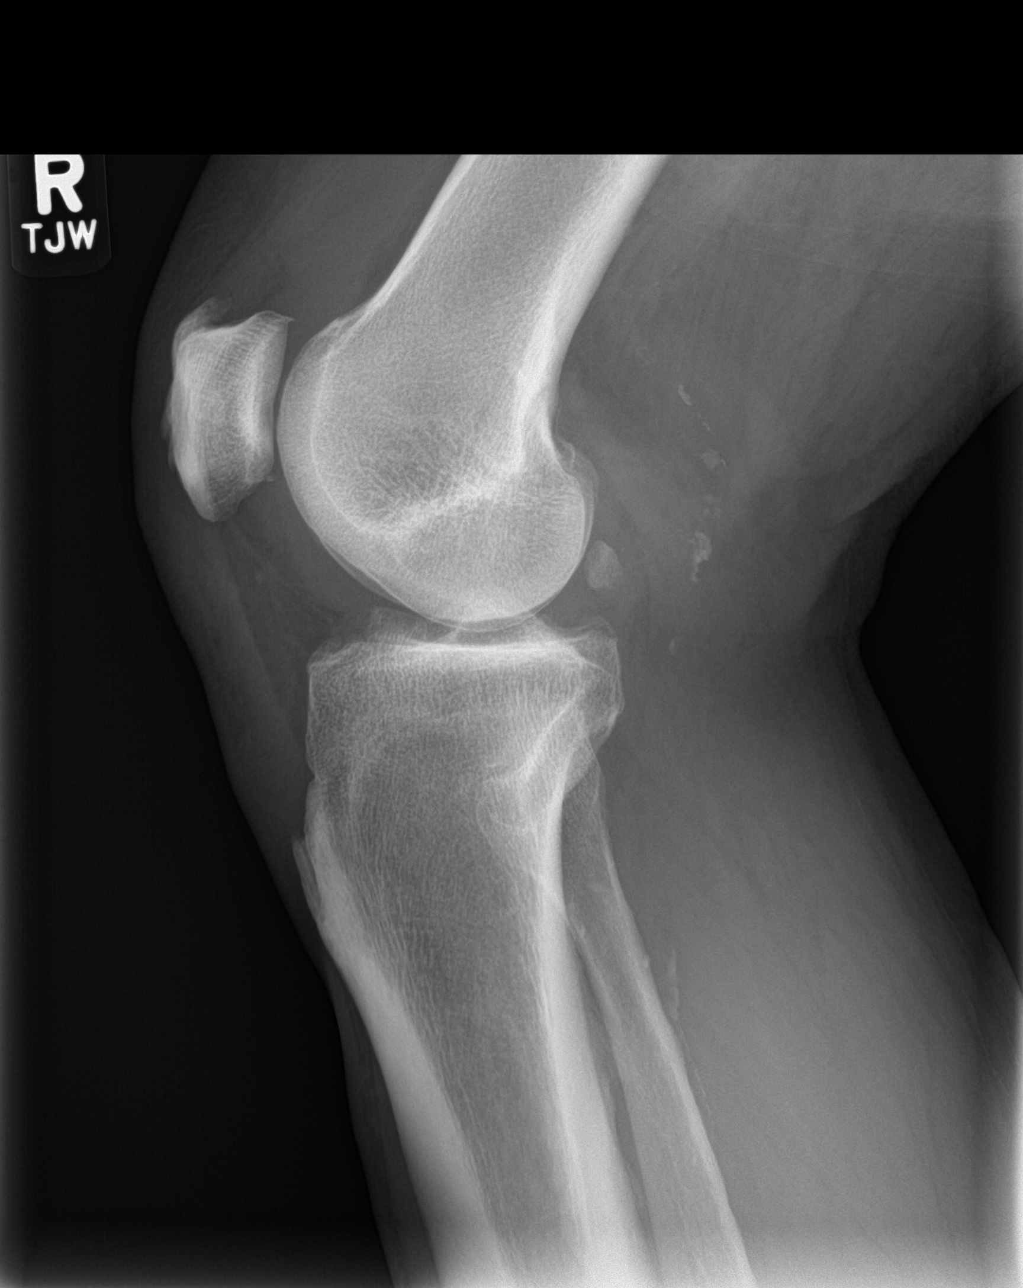

[patella skyline]
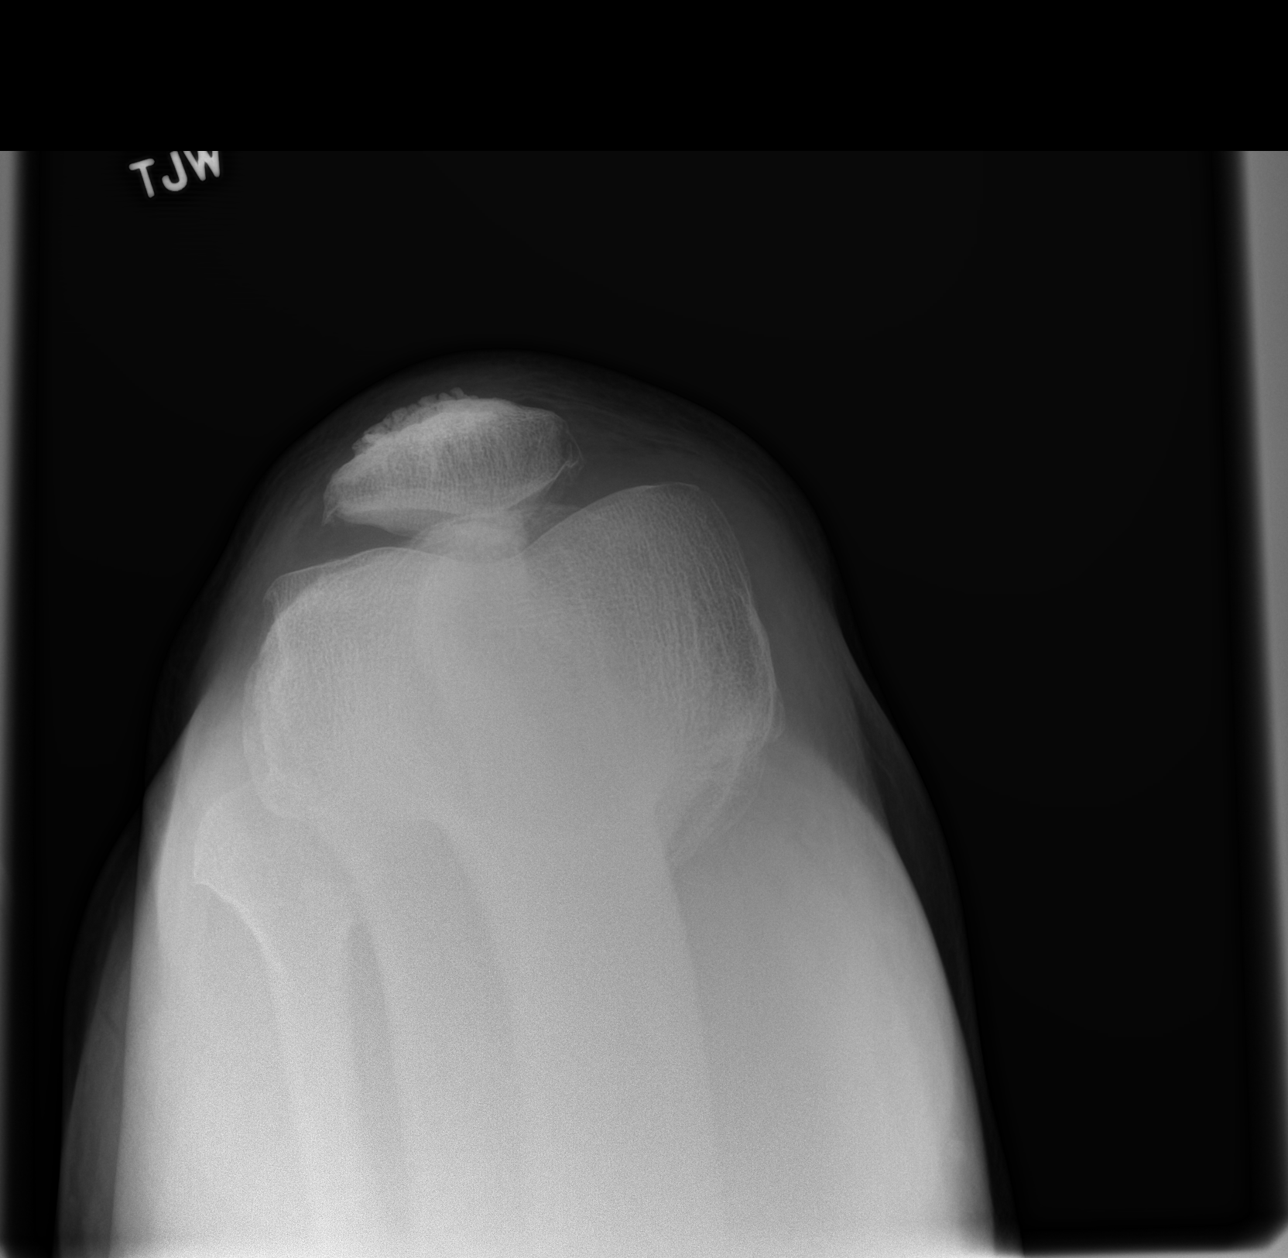

[knee obl]
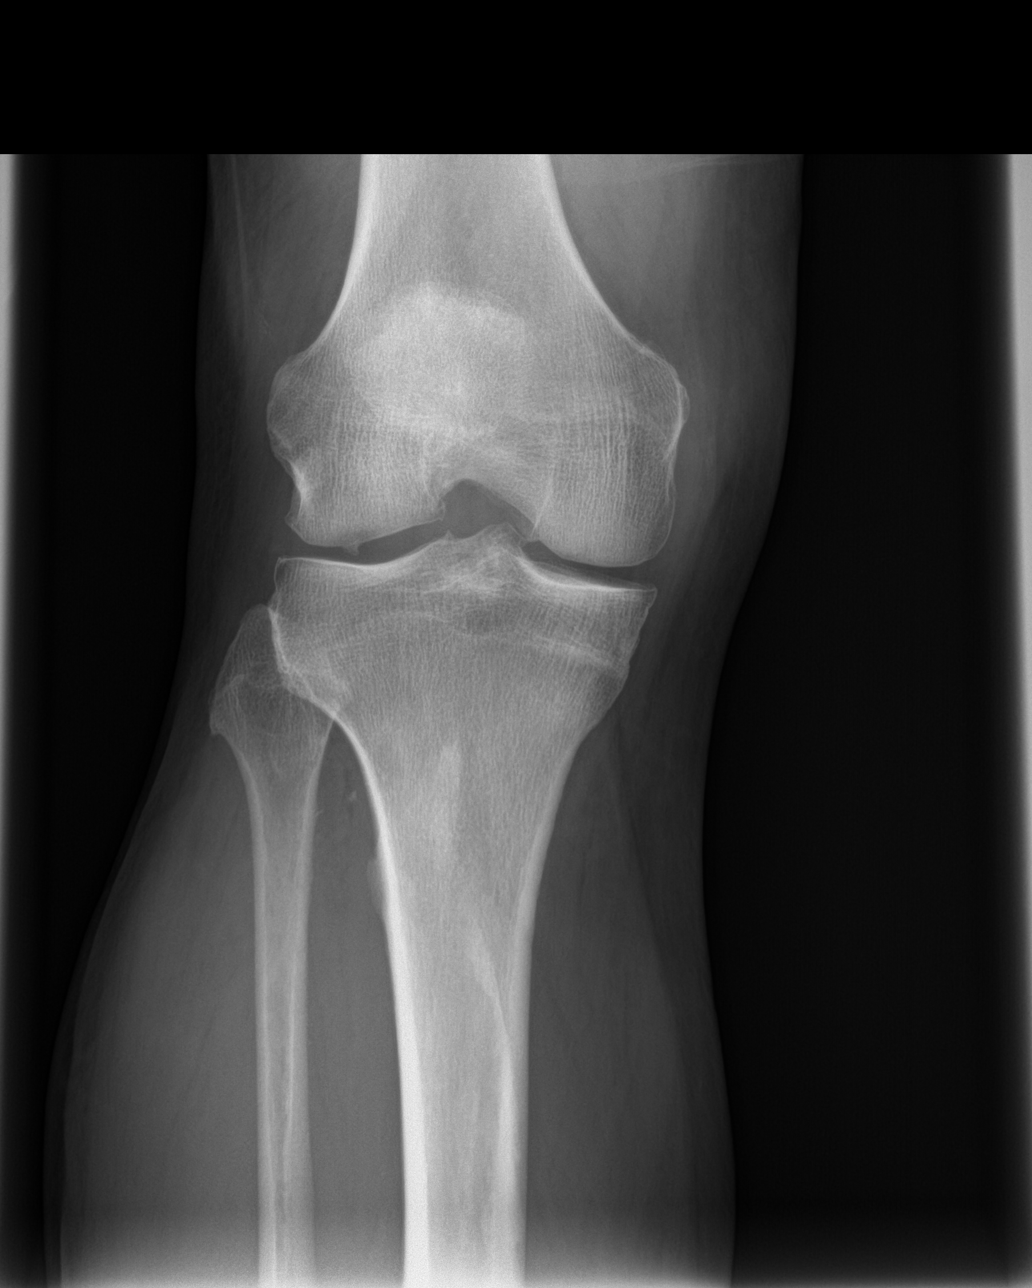

[4 of 4 positions shown; findings below may reference images not displayed]

FINDINGS: No acute bony or joint abnormality identified. No evidence of
fracture or dislocation. Knee joint effusion noted. Peripheral
vascular calcification.
IMPRESSION: 1. Tricompartment degenerative change. No acute bony abnormality.
Knee joint effusion.

2. Peripheral vascular disease.

## 2018-07-23 ENCOUNTER — Encounter: Payer: Self-pay | Admitting: Family Medicine

## 2018-11-27 DIAGNOSIS — E871 Hypo-osmolality and hyponatremia: Secondary | ICD-10-CM | POA: Diagnosis not present

## 2018-11-27 DIAGNOSIS — N390 Urinary tract infection, site not specified: Secondary | ICD-10-CM | POA: Diagnosis not present

## 2018-11-27 DIAGNOSIS — R69 Illness, unspecified: Secondary | ICD-10-CM | POA: Diagnosis not present

## 2019-02-12 ENCOUNTER — Other Ambulatory Visit: Payer: Self-pay | Admitting: Family Medicine

## 2019-02-15 ENCOUNTER — Telehealth: Payer: Self-pay | Admitting: Family Medicine

## 2019-02-15 DIAGNOSIS — D696 Thrombocytopenia, unspecified: Secondary | ICD-10-CM

## 2019-02-15 DIAGNOSIS — I10 Essential (primary) hypertension: Secondary | ICD-10-CM

## 2019-02-15 DIAGNOSIS — R7303 Prediabetes: Secondary | ICD-10-CM

## 2019-02-15 DIAGNOSIS — Z125 Encounter for screening for malignant neoplasm of prostate: Secondary | ICD-10-CM

## 2019-02-15 NOTE — Telephone Encounter (Signed)
-----   Message from Ellamae Sia sent at 02/13/2019 11:43 AM EDT ----- Regarding: Lab orders for Friday, 4.24.20 Lab orders , thanks

## 2019-02-18 ENCOUNTER — Ambulatory Visit (INDEPENDENT_AMBULATORY_CARE_PROVIDER_SITE_OTHER): Payer: Medicare HMO

## 2019-02-18 DIAGNOSIS — Z0000 Encounter for general adult medical examination without abnormal findings: Secondary | ICD-10-CM | POA: Diagnosis not present

## 2019-02-18 NOTE — Progress Notes (Signed)
Subjective:   [name] is a 73 y.o. male who presents for Medicare Annual/Subsequent preventive examination.  Review of Systems:  N/A Cardiac Risk Factors include: advanced age (>40men, >35 women);male gender;obesity (BMI >30kg/m2);dyslipidemia     Objective:    Vitals: There were no vitals taken for this visit.  There is no height or weight on file to calculate BMI.  Advanced Directives 02/06/2018 08/07/2017 06/19/2017 02/05/2017  Does Patient Have a Medical Advance Directive? Yes Yes Yes No  Type of Advance Directive Living will [place];Living will Living will;Healthcare Power of Attorney -    Tobacco Social History   Tobacco Use  Smoking Status Never Smoker  Smokeless Tobacco Never Used     Counseling given: No   Clinical Intake:  Pre-visit preparation completed: Yes  Pain : No/denies pain Pain Score: 0-No pain     Nutritional Risks: None Diabetes: No  How often do you need to have someone help you when you read instructions, pamphlets, or other written materials from your doctor or pharmacy?: 1 - Never What is the last grade level you completed in school?: 12th grade + some college  Interpreter Needed?: No  Information entered by :: LPinson, LPN  Past Medical History:  Diagnosis Date  . Arthritis   . Back pain   . Diverticulosis   . Dyspnea    with exertion  . Heart murmur   . History of colon polyps   . Hyperlipidemia   . Hypertension    Past Surgical History:  Procedure Laterality Date  . COLONOSCOPY    . INGUINAL HERNIA REPAIR Bilateral 02/13/2017   Procedure: LAPAROSCOPIC BILATERAL INGUINAL HERNIA REPAIR WITH MESH;  Surgeon: Coralie Keens, MD;  Location: [facility];  Service: General;  Laterality: Bilateral;  . LASIK    . UMBILICAL HERNIA REPAIR N/A 02/13/2017   Procedure: LAPAROSCOPIC UMBILICAL HERNIA REPAIR;  Surgeon: Coralie Keens, MD;  [facility];  Service: General;  Laterality: N/A;   Family History   Problem Relation Age of Onset  . Cancer Mother        brain tumor  . Emphysema Father   . Diabetes Father   . HIV Brother   . Colon cancer Neg Hx    Social History   Socioeconomic History  . Marital status: Married    Spouse name: Not on file  . Number of children: 1  . Years of education: Not on file  . Highest education level: Not on file  Occupational History    Employer: [employer]  . Financial resource strain: Not on file  . Food insecurity:    Worry: Not on file    Inability: Not on file  . Transportation needs:    Medical: Not on file    Non-medical: Not on file  Tobacco Use  . Smoking status: Never Smoker  . Smokeless tobacco: Never Used  Substance and Sexual Activity  . Alcohol use: Yes    Alcohol/week: 21.0 - 28.0 standard drinks    Types: 21 - 28 Cans of beer per week    Comment: 3-4 cans of beer daily  . Drug use: No  . Sexual activity: Yes  Lifestyle  . Physical activity:    Days per week: Not on file    Minutes per session: Not on file  . Stress: Not on file  Relationships  . Social connections:    Talks on phone: Not on file    Gets together: Not on  file    Attends religious service: Not on file    Active member of club or organization: Not on file    Attends meetings of clubs or organizations: Not on file    Relationship status: Not on file  Other Topics Concern  . Not on file  Social History Narrative  . Not on file    Outpatient Encounter Medications as of 02/18/2019  Medication Sig  . amLODipine (NORVASC) 5 MG tablet TAKE 1 TABLET BY MOUTH EVERY DAY IN THE EVENING  . atorvastatin (LIPITOR) 40 MG tablet TAKE 1 TABLET BY MOUTH EVERY DAY IN THE EVENING  . Cyanocobalamin (B-12 PO) Take 1 tablet by mouth as needed.   . doxylamine, Sleep, (UNISOM) 25 MG tablet Take 25 mg by mouth at bedtime.  Marland Kitchen ibuprofen (ADVIL,MOTRIN) 200 MG tablet Take 400 mg by mouth 2 (two) times daily as needed for headache or moderate pain.   No  facility-administered encounter medications on file as of 02/18/2019.     Activities of Daily Living In your present state of health, do you have any difficulty performing the following activities: 02/18/2019  Hearing? N  Vision? N  Difficulty concentrating or making decisions? N  Walking or climbing stairs? N  Dressing or bathing? N  Doing errands, shopping? N  Preparing Food and eating ? N  Using the Toilet? N  In the past six months, have you accidently leaked urine? N  Do you have problems with loss of bowel control? N  Managing your Medications? N  Managing your Finances? N  Housekeeping or managing your Housekeeping? N  Some recent data might be hidden    Patient Care Team: Tower, Wynelle Fanny, MD as PCP - General   Assessment:   This is a routine wellness examination for Mohamed.  Vision Screening Comments: Last vision exam approx. 20 yrs ago  Exercise Activities and Dietary recommendations Current Exercise Habits: Home exercise routine, Type of exercise: Other - see comments;walking(hiking, kayaking), Time (Minutes): 60, Frequency (Times/Week): 7, Weekly Exercise (Minutes/Week): 420, Intensity: Moderate, Exercise limited by: None identified  Goals    . Increase physical activity     Starting 02/18/2019, I will continue to exercise for at least 7 hours weekly.        Fall Risk Fall Risk  02/18/2019 02/06/2018 02/05/2017 01/20/2016 01/18/2015  Falls in the past year? 0 No No Yes Yes  Number falls in past yr: - - - 1 2 or more  Injury with Fall? - - - No No    Depression Screen PHQ 2/9 Scores 02/18/2019 02/06/2018 02/05/2017 01/20/2016  PHQ - 2 Score 0 0 0 0  PHQ- 9 Score 0 0 - -    Cognitive Function MMSE - Mini Mental State Exam 02/18/2019 02/06/2018 02/05/2017  Orientation to time 5 5 5   Orientation to Place 5 5 5   Registration 3 3 3   Attention/ Calculation 0 0 0  Recall 3 3 2   Recall-comments - - pt was unable to recall 1 of 3  words  Language- name 2 objects 0 0 0   Language- repeat 1 1 1   Language- follow 3 step command 0 3 3  Language- read & follow direction 0 0 0  Write a sentence 0 0 0  Copy design 0 0 0  Total score 17 20 19      PLEASE NOTE: A Mini-Cog screen was completed. Maximum score is 17. A value of 0 denotes this part of [facility] MMSE was not completed or  the patient failed this part of the Mini-Cog screening.   Mini-Cog Screening Orientation to Time - Max 5 pts Orientation to Place - Max 5 pts Registration - Max 3 pts Recall - Max 3 pts Language Repeat - Max 1 pts     Immunization History  Administered Date(s) Administered  . Influenza, High Dose Seasonal PF 08/17/2017  . Influenza,inj,Quad PF,6+ Mos 08/26/2018  . Influenza-Unspecified 08/29/2017  . Pneumococcal Conjugate-13 01/18/2015  . Pneumococcal Polysaccharide-23 01/20/2016  . Td 11/11/2002  . Tdap 01/12/2013   Screening Tests Health Maintenance  Topic Date Due  . INFLUENZA VACCINE  05/30/2019  . COLONOSCOPY  08/07/2020  . TETANUS/TDAP  01/13/2023  . Hepatitis C Screening  Completed  . PNA vac Low Risk Adult  Completed     Plan:     I have personally reviewed, addressed, and noted the following in the patient's chart:  A. Medical and social history B. Use of alcohol, tobacco or illicit drugs  C. Current medications and supplements D. Functional ability and status E.  Nutritional status F.  Physical activity G. Advance directives H. List of other physicians I.  Hospitalizations, surgeries, and ER visits in previous 12 months J.  Vitals (unless it is a telemedicine encounter) K. Screenings to include hearing, vision, cognitive, depression L. Referrals and appointments   In addition, I have reviewed and discussed with patient certain preventive protocols, quality metrics, and best practice recommendations. A written personalized care plan for preventive services and recommendations were provided to patient.  With patient's permission, we connected on  02/18/19 at  8:00 AM EDT by a video enabled telemedicine application. Two patient identifiers were used to ensure the encounter occurred with the correct person. .   Patient was in home and writer was in office.   Signed,   Lindell Noe, MHA, BS, LPN Health Coach

## 2019-02-18 NOTE — Patient Instructions (Signed)
Mr. Johansson , Thank you for taking time to come for your Medicare Wellness Visit. I appreciate your ongoing commitment to your health goals. Please review the following plan we discussed and let me know if I can assist you in the future.   These are the goals we discussed: Goals    . Increase physical activity     Starting 02/18/2019, I will continue to exercise for at least 7 hours weekly.        This is a list of the screening recommended for you and due dates:  Health Maintenance  Topic Date Due  . Flu Shot  05/30/2019  . Colon Cancer Screening  08/07/2020  . Tetanus Vaccine  01/13/2023  .  Hepatitis C: One time screening is recommended by Center for Disease Control  (CDC) for  adults born from [birth year] through 1963.   Completed  . Pneumonia vaccines  Completed   Preventive Care for Adults  A healthy lifestyle and preventive care can promote health and wellness. Preventive health guidelines for adults include the following key practices.  . A routine yearly physical is a good way to check with your health care provider about your health and preventive screening. It is a chance to share any concerns and updates on your health and to receive a thorough exam.  . Visit your dentist for a routine exam and preventive care every 6 months. Brush your teeth twice a day and floss once a day. Good oral hygiene prevents tooth decay and gum disease.  . The frequency of eye exams is based on your age, health, family medical history, use  of contact lenses, and other factors. Follow your health care provider's recommendations for frequency of eye exams.  . Eat a healthy diet. Foods like vegetables, fruits, whole grains, low-fat dairy products, and lean protein foods contain the nutrients you need without too many calories. Decrease your intake of foods high in solid fats, added sugars, and salt. Eat the right amount of calories for you. Get information about a proper diet from your health care  provider, if necessary.  . Regular physical exercise is one of the most important things you can do for your health. Most adults should get at least 150 minutes of moderate-intensity exercise (any activity that increases your heart rate and causes you to sweat) each week. In addition, most adults need muscle-strengthening exercises on 2 or more days a week.  Silver Sneakers may be a benefit available to you. To determine eligibility, you may visit the website: www.silversneakers.com or contact program at 402-797-2940 Mon-Fri between 8AM-8PM.   . Maintain a healthy weight. The body mass index (BMI) is a screening tool to identify possible weight problems. It provides an estimate of body fat based on height and weight. Your health care provider can find your BMI and can help you achieve or maintain a healthy weight.   For adults 20 years and older: ? A BMI below 18.5 is considered underweight. ? A BMI of 18.5 to 24.9 is normal. ? A BMI of 25 to 29.9 is considered overweight. ? A BMI of 30 and above is considered obese.   . Maintain normal blood lipids and cholesterol levels by exercising and minimizing your intake of saturated fat. Eat a balanced diet with plenty of fruit and vegetables. Blood tests for lipids and cholesterol should begin at age 6 and be repeated every 5 years. If your lipid or cholesterol levels are high, you are over 50, or you are  at high risk for heart disease, you may need your cholesterol levels checked more frequently. Ongoing high lipid and cholesterol levels should be treated with medicines if diet and exercise are not working.  . If you smoke, find out from your health care provider how to quit. If you do not use tobacco, please do not start.  . If you choose to drink alcohol, please do not consume more than 2 drinks per day. One drink is considered to be 12 ounces (355 mL) of beer, 5 ounces (148 mL) of wine, or 1.5 ounces (44 mL) of liquor.  . If you are 24-79 years  old, ask your health care provider if you should take aspirin to prevent strokes.  . Use sunscreen. Apply sunscreen liberally and repeatedly throughout the day. You should seek shade when your shadow is shorter than you. Protect yourself by wearing long sleeves, pants, a wide-brimmed hat, and sunglasses year round, whenever you are outdoors.  . Once a month, do a whole body skin exam, using a mirror to look at the skin on your back. Tell your health care provider of new moles, moles that have irregular borders, moles that are larger than a pencil eraser, or moles that have changed in shape or color.

## 2019-02-18 NOTE — Progress Notes (Signed)
PCP notes:   Health maintenance:  No gaps identified  Abnormal screenings:   None  Patient concerns:   None  Nurse concerns:  None  Next PCP appt:   02/25/19 @ 0900  I reviewed health advisor's note, was available for consultation, and agree with documentation and plan. Loura Pardon MD

## 2019-02-20 ENCOUNTER — Other Ambulatory Visit (INDEPENDENT_AMBULATORY_CARE_PROVIDER_SITE_OTHER): Payer: Medicare HMO

## 2019-02-20 ENCOUNTER — Other Ambulatory Visit: Payer: Self-pay

## 2019-02-20 DIAGNOSIS — Z125 Encounter for screening for malignant neoplasm of prostate: Secondary | ICD-10-CM | POA: Diagnosis not present

## 2019-02-20 DIAGNOSIS — R7303 Prediabetes: Secondary | ICD-10-CM | POA: Diagnosis not present

## 2019-02-20 DIAGNOSIS — I10 Essential (primary) hypertension: Secondary | ICD-10-CM | POA: Diagnosis not present

## 2019-02-20 DIAGNOSIS — D696 Thrombocytopenia, unspecified: Secondary | ICD-10-CM

## 2019-02-20 LAB — HEMOGLOBIN A1C: Hgb A1c MFr Bld: 5.3 % (ref 4.6–6.5)

## 2019-02-20 LAB — CBC WITH DIFFERENTIAL/PLATELET
Basophils Absolute: 0 10*3/uL (ref 0.0–0.1)
Basophils Relative: 0.8 % (ref 0.0–3.0)
Eosinophils Absolute: 0.3 10*3/uL (ref 0.0–0.7)
Eosinophils Relative: 5.5 % — ABNORMAL HIGH (ref 0.0–5.0)
HCT: 41.4 % (ref 39.0–52.0)
Hemoglobin: 14.6 g/dL (ref 13.0–17.0)
Lymphocytes Relative: 38.8 % (ref 12.0–46.0)
Lymphs Abs: 2.2 10*3/uL (ref 0.7–4.0)
MCHC: 35.2 g/dL (ref 30.0–36.0)
MCV: 92.8 fl (ref 78.0–100.0)
Monocytes Absolute: 0.6 10*3/uL (ref 0.1–1.0)
Monocytes Relative: 9.9 % (ref 3.0–12.0)
Neutro Abs: 2.6 10*3/uL (ref 1.4–7.7)
Neutrophils Relative %: 45 % (ref 43.0–77.0)
Platelets: 140 10*3/uL — ABNORMAL LOW (ref 150.0–400.0)
RBC: 4.47 Mil/uL (ref 4.22–5.81)
RDW: 13.2 % (ref 11.5–15.5)
WBC: 5.7 10*3/uL (ref 4.0–10.5)

## 2019-02-20 LAB — COMPREHENSIVE METABOLIC PANEL
ALT: 31 U/L (ref 0–53)
AST: 28 U/L (ref 0–37)
Albumin: 4.6 g/dL (ref 3.5–5.2)
Alkaline Phosphatase: 66 U/L (ref 39–117)
BUN: 19 mg/dL (ref 6–23)
CO2: 27 mEq/L (ref 19–32)
Calcium: 8.9 mg/dL (ref 8.4–10.5)
Chloride: 103 mEq/L (ref 96–112)
Creatinine, Ser: 0.8 mg/dL (ref 0.40–1.50)
GFR: 95.43 mL/min (ref >60.00)
Glucose, Bld: 115 mg/dL — ABNORMAL HIGH (ref 70–99)
Potassium: 4.6 mEq/L (ref 3.5–5.1)
Sodium: 138 mEq/L (ref 135–145)
Total Bilirubin: 0.9 mg/dL (ref 0.2–1.2)
Total Protein: 6.9 g/dL (ref 6.0–8.3)

## 2019-02-20 LAB — LIPID PANEL
Cholesterol: 214 mg/dL — ABNORMAL HIGH (ref 0–200)
HDL: 67.9 mg/dL (ref >39.00)
LDL Cholesterol: 132 mg/dL — ABNORMAL HIGH (ref 0–99)
NonHDL: 146.23
Total CHOL/HDL Ratio: 3
Triglycerides: 72 mg/dL (ref 0.0–149.0)
VLDL: 14.4 mg/dL (ref 0.0–40.0)

## 2019-02-20 LAB — PSA, MEDICARE: PSA: 2.59 ng/ml (ref 0.10–4.00)

## 2019-02-20 LAB — TSH: TSH: 3.59 u[IU]/mL (ref 0.35–4.50)

## 2019-02-23 ENCOUNTER — Other Ambulatory Visit: Payer: Medicare HMO

## 2019-02-25 ENCOUNTER — Ambulatory Visit (INDEPENDENT_AMBULATORY_CARE_PROVIDER_SITE_OTHER): Payer: Medicare HMO | Admitting: Family Medicine

## 2019-02-25 ENCOUNTER — Encounter: Payer: Self-pay | Admitting: Family Medicine

## 2019-02-25 ENCOUNTER — Encounter: Payer: Medicare HMO | Admitting: Family Medicine

## 2019-02-25 DIAGNOSIS — E7800 Pure hypercholesterolemia, unspecified: Secondary | ICD-10-CM

## 2019-02-25 DIAGNOSIS — Z1211 Encounter for screening for malignant neoplasm of colon: Secondary | ICD-10-CM | POA: Diagnosis not present

## 2019-02-25 DIAGNOSIS — R7303 Prediabetes: Secondary | ICD-10-CM

## 2019-02-25 DIAGNOSIS — Z125 Encounter for screening for malignant neoplasm of prostate: Secondary | ICD-10-CM | POA: Diagnosis not present

## 2019-02-25 DIAGNOSIS — E785 Hyperlipidemia, unspecified: Secondary | ICD-10-CM | POA: Insufficient documentation

## 2019-02-25 DIAGNOSIS — I10 Essential (primary) hypertension: Secondary | ICD-10-CM

## 2019-02-25 DIAGNOSIS — D696 Thrombocytopenia, unspecified: Secondary | ICD-10-CM

## 2019-02-25 MED ORDER — AMLODIPINE BESYLATE 5 MG PO TABS: ORAL_TABLET | ORAL | 3 refills | Status: DC

## 2019-02-25 MED ORDER — ATORVASTATIN CALCIUM 40 MG PO TABS: ORAL_TABLET | ORAL | 3 refills | Status: DC

## 2019-02-25 NOTE — Assessment & Plan Note (Signed)
Lab Results  Component Value Date   PSA 2.59 02/20/2019   PSA 1.43 02/06/2018   PSA 1.06 02/05/2017   No changes clinically Nocturia 1 time per night  PGF had prostate cancer at a very late age Will continue to follow

## 2019-02-25 NOTE — Assessment & Plan Note (Signed)
Platelet ct 140 Stable No excessive bruising or bleeding Will continue to follow

## 2019-02-25 NOTE — Progress Notes (Signed)
Virtual Visit via Video Note  I connected with [name] on 02/25/19 at  9:00 AM EDT by a video enabled telemedicine application and verified that I am speaking with the correct person using two identifiers.  the patient is at home today  I am in my office  I discussed the limitations of evaluation and management by telemedicine and the availability of in person appointments. The patient expressed understanding and agreed to proceed.  History of Present Illness: Pt presents for f/u of chronic medical problems   Has been feeling good overall  Some runny nose in ams only from pollen allergy -it does not keep him indoors Taking care of himself   He had amw visit on 4/22 No gaps or problems identified   Current weight  Wt Readings from Last 3 Encounters:  02/24/18 212 lb 12 oz (96.5 kg)  02/06/18 207 lb 4 oz (94 kg)  08/07/17 212 lb (96.2 kg)  has not weighed lately = 210 last he checked  Cannot go to the gym- but exercise outdoors Colonoscopy 10/18 with 3 y recall for polyps   Prostate health Lab Results  Component Value Date   PSA 2.59 02/20/2019   PSA 1.43 02/06/2018   PSA 1.06 02/05/2017  no prostate problems or symptoms  Nocturia just once- no changes  PGF may have had prostate cancer at very late age   Last colonoscopy 2018 - up to date  Polyps / 3 y recall   Zoster status -has not had the vaccine  Will check on coverage - he is interested   Hypertension  BP Readings from Last 3 Encounters:  02/24/18 135/82  02/06/18 130/70  08/07/17 121/88  has not checked bp anywhere recently  No symptoms of high or low bp  Taking amlodipine   Lab Results  Component Value Date   CREATININE 0.80 02/20/2019   BUN 19 02/20/2019   NA 138 02/20/2019   K 4.6 02/20/2019   CL 103 02/20/2019   CO2 27 02/20/2019   Lab Results  Component Value Date   ALT 31 02/20/2019   AST 28 02/20/2019   ALKPHOS 66 02/20/2019   BILITOT 0.9 02/20/2019    Lab Results  Component  Value Date   TSH 3.59 02/20/2019    Prediabetes Lab Results  Component Value Date   HGBA1C 5.3 02/20/2019  he does not eat sweets very often  Some white bread and some pasta  Was binging on chips for a while    Thrombocytopenia  Lab Results  Component Value Date   WBC 5.7 02/20/2019   HGB 14.6 02/20/2019   HCT 41.4 02/20/2019   MCV 92.8 02/20/2019   PLT 140.0 (L) 02/20/2019   plt ct up from 133 No excess bleeding / he does bruise more easily with age   Hyperlipidemia Lab Results  Component Value Date   CHOL 214 (H) 02/20/2019   CHOL 211 (H) 02/06/2018   CHOL 234 (H) 02/05/2017   Lab Results  Component Value Date   HDL 67.90 02/20/2019   HDL 65.80 02/06/2018   HDL 61.10 02/05/2017   Lab Results  Component Value Date   LDLCALC 132 (H) 02/20/2019   LDLCALC 130 (H) 02/06/2018   LDLCALC 155 (H) 02/05/2017   Lab Results  Component Value Date   TRIG 72.0 02/20/2019   TRIG 77.0 02/06/2018   TRIG 89.0 02/05/2017   Lab Results  Component Value Date   CHOLHDL 3 02/20/2019   CHOLHDL 3 02/06/2018  CHOLHDL 4 02/05/2017   Lab Results  Component Value Date   LDLDIRECT 130.0 01/06/2013   LDLDIRECT 143.1 10/01/2011   LDLDIRECT 139.4 04/05/2010   Taking atorvastatin 40 mg -no missed doses  Diet -avoids fried foods/greasy foods  Does not eat fast food (some [place] and pizza)   Review of Systems  Constitutional: Negative for chills, fever, malaise/fatigue and weight loss.  HENT: Negative for congestion, hearing loss and sore throat.   Eyes: Negative for blurred vision, discharge and redness.  Respiratory: Negative for cough and shortness of breath.   Cardiovascular: Negative for chest pain, palpitations and leg swelling.  Gastrointestinal: Negative for abdominal pain, constipation, diarrhea, heartburn, nausea and vomiting.  Genitourinary: Negative for frequency and urgency.  Musculoskeletal: Negative for joint pain and myalgias.       Joints make noise but do  not hurt currently  Skin: Negative for rash.  Neurological: Negative for dizziness, focal weakness and headaches.  Endo/Heme/Allergies: Positive for environmental allergies. Negative for polydipsia. Does not bruise/bleed easily.  Psychiatric/Behavioral: Negative for depression. The patient is not nervous/anxious.      Patient Active Problem List   Diagnosis Date Noted  . Hyperlipidemia 02/25/2019  . Colon cancer screening 02/12/2017  . Knee pain, right 12/26/2016  . Routine general medical examination at a health care facility 01/20/2016  . Heart murmur, systolic 16/07/9603  . Encounter for Medicare annual wellness exam 09/24/2011  . Prostate cancer screening 09/24/2011  . Osteoarthritis, knee 02/14/2011  . TINNITUS 12/09/2009  . BACK PAIN, LUMBAR, WITH RADICULOPATHY 10/10/2009  . ESSENTIAL HYPERTENSION, BENIGN 08/16/2009  . Thrombocytopenia ([place]) 02/15/2009  . Prediabetes 01/07/2008   Past Medical History:  Diagnosis Date  . Arthritis   . Back pain   . Diverticulosis   . Dyspnea    with exertion  . Heart murmur   . History of colon polyps   . Hyperlipidemia   . Hypertension    Past Surgical History:  Procedure Laterality Date  . COLONOSCOPY    . INGUINAL HERNIA REPAIR Bilateral 02/13/2017   Procedure: LAPAROSCOPIC BILATERAL INGUINAL HERNIA REPAIR WITH MESH;  Surgeon: Coralie Keens, MD;  Location: [place];  Service: General;  Laterality: Bilateral;  . LASIK    . UMBILICAL HERNIA REPAIR N/A 02/13/2017   Procedure: LAPAROSCOPIC UMBILICAL HERNIA REPAIR;  Surgeon: Coralie Keens, MD;  Location: [place];  Service: General;  Laterality: N/A;   Social History   Tobacco Use  . Smoking status: Never Rogers  . Smokeless tobacco: Never Used  Substance Use Topics  . Alcohol use: Yes    Alcohol/week: 21.0 - 28.0 standard drinks    Types: 21 - 28 Cans of beer per week    Comment: 3-4 cans of beer daily  . Drug use: No   Family History  Problem Relation Age of Onset  .  Cancer Mother        brain tumor  . Emphysema Father   . Diabetes Father   . HIV Brother   . Colon cancer Neg Hx    Allergies  Allergen Reactions  . Ezetimibe-Simvastatin     REACTION: back pain   Current Outpatient Medications on File Prior to Visit  Medication Sig Dispense Refill  . Cyanocobalamin (B-12 PO) Take 1 tablet by mouth as needed.     . doxylamine, Sleep, (UNISOM) 25 MG tablet Take 25 mg by mouth at bedtime.    Marland Kitchen ibuprofen (ADVIL,MOTRIN) 200 MG tablet Take 400 mg by mouth 2 (two) times daily as needed  for headache or moderate pain.     No current facility-administered medications on file prior to visit.     Observations/Objective: Pt appears well/like his usual self No obvious weight loss or gain  No skin change/rash No facial swelling or asymmetry  Not hoarse /not slurring  No cough or sob on interview  Cheerful/good mood  Mentally sharp   Assessment and Plan: Problem List Items Addressed This Visit      Cardiovascular and Mediastinum   ESSENTIAL HYPERTENSION, BENIGN - Primary    Per pt -no bp changes/though he does not check at home  It has been stable the last 3 visits in 130s/70s-80s No symptoms of high or low bp Enc healthy habits and continued exercise         Relevant Medications   amLODipine (NORVASC) 5 MG tablet   atorvastatin (LIPITOR) 40 MG tablet     Other   Prediabetes    Lab Results  Component Value Date   HGBA1C 5.3 02/20/2019   Stable  disc imp of low glycemic diet and wt loss to prevent DM2        Thrombocytopenia ([facility])    Platelet ct 140 Stable No excessive bruising or bleeding Will continue to follow      Prostate cancer screening    Lab Results  Component Value Date   PSA 2.59 02/20/2019   PSA 1.43 02/06/2018   PSA 1.06 02/05/2017   No changes clinically Nocturia 1 time per night  PGF had prostate cancer at a very late age Will continue to follow      Colon cancer screening    Had CS in 2018 - 3 y recall due  to polyps      Hyperlipidemia    Disc goals for lipids and reasons to control them Rev last labs with pt Rev low sat fat diet in detail LDL 132 with goal under 130 (under 100 optimally) Taking atorvastatin 40 which he tolerates Stressed imp of a better diet with less sat/trans fats        Relevant Medications   amLODipine (NORVASC) 5 MG tablet   atorvastatin (LIPITOR) 40 MG tablet       Follow Up Instructions: Take care of yourself  Keep exercising and getting outdoors Labs are fairly stable To prevent diabetes Try to get most of your carbohydrates from produce (with the exception of white potatoes)  Eat less bread/pasta/rice/snack foods/cereals/sweets and other items from the middle of the grocery store (processed carbs)   For cholesterol  Avoid red meat/ fried foods/ egg yolks/ fatty breakfast meats/ butter, cheese and high fat dairy/ and shellfish    If you are interested in the new shingles vaccine (Shingrix) - call your local pharmacy to check on coverage and availability  If affordable, get on a wait list at your pharmacy to get the vaccine.   I discussed the assessment and treatment plan with the patient. The patient was provided an opportunity to ask questions and all were answered. The patient agreed with the plan and demonstrated an understanding of the instructions.   The patient was advised to call back or seek an in-person evaluation if the symptoms worsen or if the condition fails to improve as anticipated.     Loura Pardon, MD

## 2019-02-25 NOTE — Assessment & Plan Note (Signed)
Disc goals for lipids and reasons to control them Rev last labs with pt Rev low sat fat diet in detail LDL 132 with goal under 130 (under 100 optimally) Taking atorvastatin 40 which he tolerates Stressed imp of a better diet with less sat/trans fats

## 2019-02-25 NOTE — Assessment & Plan Note (Signed)
Per pt -no bp changes/though he does not check at home  It has been stable the last 3 visits in 130s/70s-80s No symptoms of high or low bp Enc healthy habits and continued exercise

## 2019-02-25 NOTE — Assessment & Plan Note (Signed)
Had CS in 2018 - 3 y recall due to polyps

## 2019-02-25 NOTE — Patient Instructions (Signed)
Take care of yourself  Keep exercising and getting outdoors Labs are fairly stable To prevent diabetes Try to get most of your carbohydrates from produce (with the exception of white potatoes)  Eat less bread/pasta/rice/snack foods/cereals/sweets and other items from the middle of the grocery store (processed carbs)   For cholesterol  Avoid red meat/ fried foods/ egg yolks/ fatty breakfast meats/ butter, cheese and high fat dairy/ and shellfish    If you are interested in the new shingles vaccine (Shingrix) - call your local pharmacy to check on coverage and availability  If affordable, get on a wait list at your pharmacy to get the vaccine.

## 2019-02-25 NOTE — Assessment & Plan Note (Signed)
Lab Results  Component Value Date   HGBA1C 5.3 02/20/2019   Stable  disc imp of low glycemic diet and wt loss to prevent DM2

## 2019-02-27 ENCOUNTER — Encounter: Payer: Medicare HMO | Admitting: Family Medicine

## 2019-05-13 ENCOUNTER — Ambulatory Visit (INDEPENDENT_AMBULATORY_CARE_PROVIDER_SITE_OTHER)
Admission: RE | Admit: 2019-05-13 | Discharge: 2019-05-13 | Disposition: A | Discharge status: Home / Self Care | Payer: Medicare HMO | Source: Ambulatory Visit | Attending: Family Medicine | Admitting: Family Medicine

## 2019-05-13 ENCOUNTER — Other Ambulatory Visit: Payer: Self-pay

## 2019-05-13 ENCOUNTER — Encounter: Payer: Self-pay | Admitting: Family Medicine

## 2019-05-13 ENCOUNTER — Ambulatory Visit (INDEPENDENT_AMBULATORY_CARE_PROVIDER_SITE_OTHER): Payer: Medicare HMO | Admitting: Family Medicine

## 2019-05-13 VITALS — BP 160/90 | HR 72 | Temp 98.0°F | Ht 68.5 in | Wt 215.3 lb

## 2019-05-13 DIAGNOSIS — Z7289 Other problems related to lifestyle: Secondary | ICD-10-CM

## 2019-05-13 DIAGNOSIS — M25562 Pain in left knee: Secondary | ICD-10-CM

## 2019-05-13 DIAGNOSIS — I10 Essential (primary) hypertension: Secondary | ICD-10-CM

## 2019-05-13 DIAGNOSIS — F1090 Alcohol use, unspecified, uncomplicated: Secondary | ICD-10-CM

## 2019-05-13 DIAGNOSIS — M1712 Unilateral primary osteoarthritis, left knee: Secondary | ICD-10-CM | POA: Diagnosis not present

## 2019-05-13 MED ORDER — LISINOPRIL 10 MG PO TABS: 10.0000 mg | ORAL_TABLET | Freq: Every day | ORAL | 3 refills | Status: DC

## 2019-05-13 NOTE — Patient Instructions (Signed)
Drink lots of water  Think about cutting back on alcohol Elevate and ice knee when you can   Xray now - we will contact you with a result  advil as needed   Start lisinopril in am  Continue other medicines   Follow up in approx 1 months for blood pressure

## 2019-05-13 NOTE — Progress Notes (Signed)
Subjective:    Patient ID: [name], male    DOB: 06/19/1946, 73 y.o.   MRN: [id number]  HPI Here for L knee injury   Wt Readings from Last 3 Encounters:  05/13/19 215 lb 5 oz (97.7 kg)  02/24/18 212 lb 12 oz (96.5 kg)  02/06/18 207 lb 4 oz (94 kg)   32.26 kg/m    Power walking- for 4-6 miles recently  L knee started to bother him/tighten up  Stepped wrong in the yard -sunday Had a severe pain -medial  He did not fall  Had to hobble to the car -drove home  Knee swelled a bit  He iced it and stayed off of it as much as possible  Taking advil Has not walked since   No numbness No weakness No instability   Known arthritis in R knee    bp check - high on first check BP Readings from Last 3 Encounters:  05/13/19 (!) 160/90  02/24/18 135/82  02/06/18 130/70   Drinking on avg 5 drinks per day Knows it is too much  Does not feel dependent Unsure if willing to cut down  Patient Active Problem List   Diagnosis Date Noted  . Left knee pain 05/13/2019  . Alcohol intake above recommended sensible limits 05/13/2019  . Hyperlipidemia 02/25/2019  . Colon cancer screening 02/12/2017  . Knee pain, right 12/26/2016  . Routine general medical examination at a health care facility 01/20/2016  . Heart murmur, systolic 93/73/4287  . Encounter for Medicare annual wellness exam 09/24/2011  . Prostate cancer screening 09/24/2011  . Osteoarthritis, knee 02/14/2011  . TINNITUS 12/09/2009  . BACK PAIN, LUMBAR, WITH RADICULOPATHY 10/10/2009  . ESSENTIAL HYPERTENSION, BENIGN 08/16/2009  . Thrombocytopenia ([place]) 02/15/2009  . Prediabetes 01/07/2008   Past Medical History:  Diagnosis Date  . Arthritis   . Back pain   . Diverticulosis   . Dyspnea    with exertion  . Heart murmur   . History of colon polyps   . Hyperlipidemia   . Hypertension    Past Surgical History:  Procedure Laterality Date  . COLONOSCOPY    . INGUINAL HERNIA REPAIR Bilateral 02/13/2017   Procedure: LAPAROSCOPIC BILATERAL INGUINAL HERNIA REPAIR WITH MESH;  Surgeon: Coralie Keens, MD;  Location: [place];  Service: General;  Laterality: Bilateral;  . LASIK    . UMBILICAL HERNIA REPAIR N/A 02/13/2017   Procedure: LAPAROSCOPIC UMBILICAL HERNIA REPAIR;  Surgeon: Coralie Keens, MD;  Location: [place];  Service: General;  Laterality: N/A;   Social History   Tobacco Use  . Smoking status: Never Smoker  . Smokeless tobacco: Never Used  Substance Use Topics  . Alcohol use: Yes    Alcohol/week: 21.0 - 28.0 standard drinks    Types: 21 - 28 Cans of beer per week    Comment: 3-4 cans of beer daily  . Drug use: No   Family History  Problem Relation Age of Onset  . Cancer Mother        brain tumor  . Emphysema Father   . Diabetes Father   . HIV Brother   . Colon cancer Neg Hx    Allergies  Allergen Reactions  . Ezetimibe-Simvastatin     REACTION: back pain   Current Outpatient Medications on File Prior to Visit  Medication Sig Dispense Refill  . amLODipine (NORVASC) 5 MG tablet TAKE 1 TABLET BY MOUTH EVERY DAY IN THE EVENING 90 tablet 3  . atorvastatin (LIPITOR) 40 MG tablet  TAKE 1 TABLET BY MOUTH EVERY DAY IN THE EVENING 90 tablet 3  . Cyanocobalamin (B-12 PO) Take 1 tablet by mouth as needed.     . doxylamine, Sleep, (UNISOM) 25 MG tablet Take 25 mg by mouth at bedtime.    Marland Kitchen ibuprofen (ADVIL,MOTRIN) 200 MG tablet Take 400 mg by mouth 2 (two) times daily as needed for headache or moderate pain.     No current facility-administered medications on file prior to visit.     Review of Systems  Constitutional: Negative for activity change, appetite change, fatigue, fever and unexpected weight change.  HENT: Negative for congestion, rhinorrhea, sore throat and trouble swallowing.   Eyes: Negative for pain, redness, itching and visual disturbance.  Respiratory: Negative for cough, chest tightness, shortness of breath and wheezing.   Cardiovascular: Negative for chest pain and  palpitations.  Gastrointestinal: Negative for abdominal pain, blood in stool, constipation, diarrhea and nausea.  Endocrine: Negative for cold intolerance, heat intolerance, polydipsia and polyuria.  Genitourinary: Negative for difficulty urinating, dysuria, frequency and urgency.  Musculoskeletal: Positive for arthralgias. Negative for joint swelling and myalgias.  Skin: Negative for pallor and rash.  Neurological: Negative for dizziness, tremors, weakness, numbness and headaches.  Hematological: Negative for adenopathy. Does not bruise/bleed easily.  Psychiatric/Behavioral: Negative for decreased concentration and dysphoric mood. The patient is not nervous/anxious.        Objective:   Physical Exam Constitutional:      Appearance: Normal appearance. He is obese. He is not ill-appearing or diaphoretic.  HENT:     Head: Normocephalic and atraumatic.  Eyes:     General: No scleral icterus.    Extraocular Movements: Extraocular movements intact.     Conjunctiva/sclera: Conjunctivae normal.     Pupils: Pupils are equal, round, and reactive to light.  Neck:     Musculoskeletal: Normal range of motion and neck supple.     Vascular: No carotid bruit.  Cardiovascular:     Rate and Rhythm: Normal rate and regular rhythm.     Pulses: Normal pulses.     Heart sounds: Normal heart sounds.  Pulmonary:     Effort: Pulmonary effort is normal. No respiratory distress.     Breath sounds: Normal breath sounds. No wheezing or rales.  Musculoskeletal:        General: Tenderness and signs of injury present. No deformity.     Right lower leg: No edema.     Left lower leg: No edema.     Comments: Knee left  Mild small effusion  No erythema /abrasion or warmth No warmth to the touch  No crepitus  ROM:  Flex 90 deg with pain  Ext -pain to hyper extend mcmurray-uncomfortable  Bounce test -uncomfortable  Stability: Anterior drawer-nl Lachman exam nl  Tenderness -medial joint line   Gait  favors R leg     Lymphadenopathy:     Cervical: No cervical adenopathy.  Skin:    General: Skin is warm and dry.     Capillary Refill: Capillary refill takes less than 2 seconds.     Coloration: Skin is not pale.     Findings: No erythema or rash.  Neurological:     Mental Status: He is alert.     Sensory: No sensory deficit.     Coordination: Coordination normal.     Deep Tendon Reflexes: Reflexes normal.  Psychiatric:        Mood and Affect: Mood normal.  Assessment & Plan:   Problem List Items Addressed This Visit      Cardiovascular and Mediastinum   ESSENTIAL HYPERTENSION, BENIGN    bp still high after several checks BP: (!) 160/90    Continues amlodipine Will add lisinopril 10 mg daily  F/u 1 mo  Work on lifestyle change Enc etoh reduction and dash eating  inst to call if any side effects like cough      Relevant Medications   lisinopril (ZESTRIL) 10 MG tablet     Other   Left knee pain - Primary    Mostly medial pain -after injury  H/o OA in other knee  No instability on exam  May have meniscal injury  Still in acute phase  inst to elevate/ice and relatively rest this leg  nsaid ok if bp is controlled (short term) Declines brace because compression hurts  Xray today/further plan to follow        Relevant Orders   DG Knee 4 Views W/Patella Left (Completed)   Alcohol intake above recommended sensible limits    About 5 drinks per day Disc health risks of this Pt denies dependence or craving bp is high  Unsure if he is open to cutting back or quitting

## 2019-05-13 NOTE — Assessment & Plan Note (Signed)
Mostly medial pain -after injury  H/o OA in other knee  No instability on exam  May have meniscal injury  Still in acute phase  inst to elevate/ice and relatively rest this leg  nsaid ok if bp is controlled (short term) Declines brace because compression hurts  Xray today/further plan to follow

## 2019-05-13 NOTE — Assessment & Plan Note (Signed)
About 5 drinks per day Disc health risks of this Pt denies dependence or craving bp is high  Unsure if he is open to cutting back or quitting

## 2019-05-13 NOTE — Assessment & Plan Note (Signed)
bp still high after several checks BP: (!) 160/90    Continues amlodipine Will add lisinopril 10 mg daily  F/u 1 mo  Work on lifestyle change Enc etoh reduction and dash eating  inst to call if any side effects like cough

## 2019-06-15 ENCOUNTER — Encounter: Payer: Self-pay | Admitting: Family Medicine

## 2019-06-15 DIAGNOSIS — M1711 Unilateral primary osteoarthritis, right knee: Secondary | ICD-10-CM

## 2019-06-16 ENCOUNTER — Encounter: Payer: Self-pay | Admitting: Family Medicine

## 2019-06-16 NOTE — Telephone Encounter (Signed)
Please call pt with orthopedic referral thanks

## 2019-07-10 DIAGNOSIS — R69 Illness, unspecified: Secondary | ICD-10-CM | POA: Diagnosis not present

## 2019-07-10 DIAGNOSIS — N390 Urinary tract infection, site not specified: Secondary | ICD-10-CM | POA: Diagnosis not present

## 2019-07-10 DIAGNOSIS — E871 Hypo-osmolality and hyponatremia: Secondary | ICD-10-CM | POA: Diagnosis not present

## 2019-07-16 DIAGNOSIS — R69 Illness, unspecified: Secondary | ICD-10-CM | POA: Diagnosis not present

## 2019-07-29 ENCOUNTER — Other Ambulatory Visit: Payer: Self-pay

## 2019-07-29 ENCOUNTER — Encounter: Payer: Self-pay | Admitting: Family Medicine

## 2019-07-29 ENCOUNTER — Ambulatory Visit (INDEPENDENT_AMBULATORY_CARE_PROVIDER_SITE_OTHER): Payer: Medicare HMO | Admitting: Family Medicine

## 2019-07-29 VITALS — BP 128/74 | HR 83 | Temp 98.7°F | Ht 68.5 in | Wt 217.0 lb

## 2019-07-29 DIAGNOSIS — M2392 Unspecified internal derangement of left knee: Secondary | ICD-10-CM

## 2019-07-29 MED ORDER — METHYLPREDNISOLONE ACETATE 40 MG/ML IJ SUSP
80.0000 mg | Freq: Once | INTRAMUSCULAR | Status: AC
  Administered 2019-07-29: 80 mg via INTRA_ARTICULAR

## 2019-07-29 NOTE — Progress Notes (Signed)
Luis Catena T. Salik Grewell, MD [facility] [name], [id number] Phone: [phone]  FAX: [phone]  Dix Hovel - 73 y.o. male  MRN [id number]  Date of Birth: September 30, 1946  Visit Date: 07/29/2019  PCP: Luis Greenspan, MD  Referred by: Rogers, Luis Fanny, MD  Chief Complaint  Patient presents with  . Knee Pain    Left   Subjective:   [name] is a 73 y.o. very pleasant male patient with Body mass index is 32.51 kg/m. who presents with the following:  L knee pain with known Mild OA.  Has been going on for about four months.  Has been powrwalking around the neighborhood.  Thinks that he sprained it.  Has lingerred and walks in the morning with dogs.    Has tried not doing anything with his knee.  Light walk with the dog. Has iced after walking.  He does still have an effusion.  It is moderate in size.  He is having some limitation on what he likes to do his activities of daily living.  Asp left knee, 25 cc fluid  Past Medical History, Surgical History, Social History, Family History, Problem List, Medications, and Allergies have been reviewed and updated if relevant.  Patient Active Problem List   Diagnosis Date Noted  . Left knee pain 05/13/2019  . Alcohol intake above recommended sensible limits 05/13/2019  . Hyperlipidemia 02/25/2019  . Colon cancer screening 02/12/2017  . Knee pain, right 12/26/2016  . Routine general medical examination at a health care facility 01/20/2016  . Heart murmur, systolic [id number]  . Encounter for Medicare annual wellness exam 09/24/2011  . Prostate cancer screening 09/24/2011  . Osteoarthritis, knee 02/14/2011  . TINNITUS 12/09/2009  . BACK PAIN, LUMBAR, WITH RADICULOPATHY 10/10/2009  . ESSENTIAL HYPERTENSION, BENIGN 08/16/2009  . Thrombocytopenia ([place]) 02/15/2009  . Prediabetes 01/07/2008    Past Medical History:  Diagnosis Date  .  Arthritis   . Back pain   . Diverticulosis   . Dyspnea    with exertion  . Heart murmur   . History of colon polyps   . Hyperlipidemia   . Hypertension     Past Surgical History:  Procedure Laterality Date  . COLONOSCOPY    . INGUINAL HERNIA REPAIR Bilateral 02/13/2017   Procedure: LAPAROSCOPIC BILATERAL INGUINAL HERNIA REPAIR WITH MESH;  Surgeon: Luis Keens, MD;  Location: [facility];  Service: General;  Laterality: Bilateral;  . LASIK    . UMBILICAL HERNIA REPAIR N/A 02/13/2017   Procedure: LAPAROSCOPIC UMBILICAL HERNIA REPAIR;  Surgeon: Luis Keens, MD;  Location: [facility];  Service: General;  Laterality: N/A;    Social History   Socioeconomic History  . Marital status: Married    Spouse name: Not on file  . Number of children: 1  . Years of education: Not on file  . Highest education level: Not on file  Occupational History    Employer: [employer]  . Financial resource strain: Not on file  . Food insecurity    Worry: Not on file    Inability: Not on file  . Transportation needs    Medical: Not on file    Non-medical: Not on file  Tobacco Use  . Smoking status: Never Smoker  . Smokeless tobacco: Never Used  Substance and Sexual Activity  . Alcohol use: Yes    Alcohol/week: 21.0 - 28.0 standard drinks    Types:  21 - 28 Cans of beer per week    Comment: 3-4 cans of beer daily  . Drug use: No  . Sexual activity: Yes  Lifestyle  . Physical activity    Days per week: Not on file    Minutes per session: Not on file  . Stress: Not on file  Relationships  . Social Herbalist on phone: Not on file    Gets together: Not on file    Attends religious service: Not on file    Active member of club or organization: Not on file    Attends meetings of clubs or organizations: Not on file    Relationship status: Not on file  . Intimate partner violence    Fear of current or ex partner: Not on file    Emotionally abused: Not on file     Physically abused: Not on file    Forced sexual activity: Not on file  Other Topics Concern  . Not on file  Social History Narrative  . Not on file    Family History  Problem Relation Age of Onset  . Cancer Mother        brain tumor  . Emphysema Father   . Diabetes Father   . HIV Brother   . Colon cancer Neg Hx     Allergies  Allergen Reactions  . Ezetimibe-Simvastatin     REACTION: back pain    Medication list reviewed and updated in full in [facility].  GEN: No fevers, chills. Nontoxic. Primarily MSK c/o today. MSK: Detailed in the HPI GI: tolerating PO intake without difficulty Neuro: No numbness, parasthesias, or tingling associated. Otherwise the pertinent positives of the ROS are noted above.   Objective:   BP 128/74   Pulse 83   Temp 98.7 F (37.1 C) (Temporal)   Ht 5' 8.5" (1.74 m)   Wt 217 lb (98.4 kg)   SpO2 97%   BMI 32.51 kg/m    GEN: WDWN, NAD, Non-toxic, Alert & Oriented x 3 HEENT: Atraumatic, Normocephalic.  Ears and Nose: No external deformity. EXTR: No clubbing/cyanosis/edema NEURO: Normal gait.  PSYCH: Normally interactive. Conversant. Not depressed or anxious appearing.  Calm demeanor.    Left knee full extension, flexion to 112 degrees.  No significant pain with movement of the patella.  Lateral joint line is no significant tenderness.  Medial joint line does have significant tenderness.  Minimal pain with stressing the knee varus and valgus.  ACL and PCL are intact.  MCL and LCL are intact.  Radiology: No results found.   Assessment and Plan:     ICD-10-CM   1. Internal derangement of left knee  M23.92 methylPREDNISolone acetate (DEPO-MEDROL) injection 80 mg   He certainly has a significant effusion now 4 months after injury.  Is almost impossible to know exactly what he did 4 months ago.  He certainly could have had a partial ligamentous injury that is now healed.  I think that 4 months out with some symptoms, he probably has a  minor meniscal injury.  He has been doing some icing, wearing a brace, taking NSAIDs.  I am to have him continue this and also aspirate and inject his knee.  Alteration of activities.  Aspiration/Injection Procedure Note [name] [birth year] Date of procedure: 07/29/2019  Procedure: Large Joint Aspiration / Injection with synovial fluid aspiration of knee, L Indications: Pain  Procedure Details Patient verbally consented; risks, benefits, and alternatives explained including possible infection. Patient  prepped with Chloraprep. Ethyl chloride for anesthesia. 10 cc of 1% Lidocaine used in wheal then injected Subcutaneous fashion with 22 gauge needle on lateral approach. Under sterilne conditions, 18 gauge needle used via lateral approach to aspirate 25 cc of yellow synovial fluid. Then 8 cc of Lidocaine 1% and 2 mL of Depo-Medrol 40 mg injected. Tolerated well, decreased pain, no complications. Medication: 2 mL of Depo-Medrol 40 mg, equaling Depo-Medrol 80 mg total   Follow-up: Return in about 2 months (around 09/28/2019).  Meds ordered this encounter  Medications  . methylPREDNISolone acetate (DEPO-MEDROL) injection 80 mg   No orders of the defined types were placed in this encounter.   Signed,  Maud Deed. Daivik Overley, MD   Outpatient Encounter Medications as of 07/29/2019  Medication Sig  . amLODipine (NORVASC) 5 MG tablet TAKE 1 TABLET BY MOUTH EVERY DAY IN THE EVENING  . atorvastatin (LIPITOR) 40 MG tablet TAKE 1 TABLET BY MOUTH EVERY DAY IN THE EVENING  . Cyanocobalamin (B-12 PO) Take 1 tablet by mouth as needed.   . doxylamine, Sleep, (UNISOM) 25 MG tablet Take 25 mg by mouth at bedtime.  Marland Kitchen ibuprofen (ADVIL,MOTRIN) 200 MG tablet Take 400 mg by mouth 2 (two) times daily as needed for headache or moderate pain.  Marland Kitchen lisinopril (ZESTRIL) 10 MG tablet Take 1 tablet (10 mg total) by mouth daily. In am  . [EXPIRED] methylPREDNISolone acetate (DEPO-MEDROL) injection 80 mg    No  facility-administered encounter medications on file as of 07/29/2019.

## 2019-09-28 ENCOUNTER — Encounter: Payer: Self-pay | Admitting: Family Medicine

## 2019-09-28 ENCOUNTER — Ambulatory Visit: Payer: Medicare HMO | Admitting: Family Medicine

## 2019-11-26 DIAGNOSIS — R69 Illness, unspecified: Secondary | ICD-10-CM | POA: Diagnosis not present

## 2020-02-28 ENCOUNTER — Telehealth: Payer: Self-pay | Admitting: Family Medicine

## 2020-02-28 DIAGNOSIS — R7303 Prediabetes: Secondary | ICD-10-CM

## 2020-02-28 DIAGNOSIS — I10 Essential (primary) hypertension: Secondary | ICD-10-CM

## 2020-02-28 DIAGNOSIS — E7800 Pure hypercholesterolemia, unspecified: Secondary | ICD-10-CM

## 2020-02-28 DIAGNOSIS — Z125 Encounter for screening for malignant neoplasm of prostate: Secondary | ICD-10-CM

## 2020-02-28 DIAGNOSIS — D696 Thrombocytopenia, unspecified: Secondary | ICD-10-CM

## 2020-02-28 NOTE — Telephone Encounter (Signed)
-----   Message from Ellamae Sia sent at 02/16/2020 11:52 AM EDT ----- Regarding: Lab orders for Monday, 5.3.21 Patient is scheduled for CPX labs, please order future labs, Thanks , Karna Christmas

## 2020-02-29 ENCOUNTER — Other Ambulatory Visit: Payer: Self-pay

## 2020-02-29 ENCOUNTER — Other Ambulatory Visit (INDEPENDENT_AMBULATORY_CARE_PROVIDER_SITE_OTHER): Payer: Medicare HMO

## 2020-02-29 DIAGNOSIS — E7800 Pure hypercholesterolemia, unspecified: Secondary | ICD-10-CM | POA: Diagnosis not present

## 2020-02-29 DIAGNOSIS — R7303 Prediabetes: Secondary | ICD-10-CM | POA: Diagnosis not present

## 2020-02-29 DIAGNOSIS — D696 Thrombocytopenia, unspecified: Secondary | ICD-10-CM

## 2020-02-29 DIAGNOSIS — I10 Essential (primary) hypertension: Secondary | ICD-10-CM

## 2020-02-29 DIAGNOSIS — Z125 Encounter for screening for malignant neoplasm of prostate: Secondary | ICD-10-CM | POA: Diagnosis not present

## 2020-02-29 LAB — LIPID PANEL
Cholesterol: 181 mg/dL (ref 0–200)
HDL: 57.4 mg/dL (ref >39.00)
LDL Cholesterol: 111 mg/dL — ABNORMAL HIGH (ref 0–99)
NonHDL: 123.65
Total CHOL/HDL Ratio: 3
Triglycerides: 63 mg/dL (ref 0.0–149.0)
VLDL: 12.6 mg/dL (ref 0.0–40.0)

## 2020-02-29 LAB — COMPREHENSIVE METABOLIC PANEL
ALT: 24 U/L (ref 0–53)
AST: 21 U/L (ref 0–37)
Albumin: 4.4 g/dL (ref 3.5–5.2)
Alkaline Phosphatase: 55 U/L (ref 39–117)
BUN: 18 mg/dL (ref 6–23)
CO2: 25 mEq/L (ref 19–32)
Calcium: 9.1 mg/dL (ref 8.4–10.5)
Chloride: 104 mEq/L (ref 96–112)
Creatinine, Ser: 0.92 mg/dL (ref 0.40–1.50)
GFR: 80.98 mL/min (ref >60.00)
Glucose, Bld: 115 mg/dL — ABNORMAL HIGH (ref 70–99)
Potassium: 4.1 mEq/L (ref 3.5–5.1)
Sodium: 137 mEq/L (ref 135–145)
Total Bilirubin: 0.8 mg/dL (ref 0.2–1.2)
Total Protein: 6.5 g/dL (ref 6.0–8.3)

## 2020-02-29 LAB — CBC WITH DIFFERENTIAL/PLATELET
Basophils Absolute: 0.1 10*3/uL (ref 0.0–0.1)
Basophils Relative: 1.1 % (ref 0.0–3.0)
Eosinophils Absolute: 0.4 10*3/uL (ref 0.0–0.7)
Eosinophils Relative: 6.7 % — ABNORMAL HIGH (ref 0.0–5.0)
HCT: 39.2 % (ref 39.0–52.0)
Hemoglobin: 13.8 g/dL (ref 13.0–17.0)
Lymphocytes Relative: 38.8 % (ref 12.0–46.0)
Lymphs Abs: 2.2 10*3/uL (ref 0.7–4.0)
MCHC: 35.1 g/dL (ref 30.0–36.0)
MCV: 95.8 fl (ref 78.0–100.0)
Monocytes Absolute: 0.5 10*3/uL (ref 0.1–1.0)
Monocytes Relative: 9.7 % (ref 3.0–12.0)
Neutro Abs: 2.4 10*3/uL (ref 1.4–7.7)
Neutrophils Relative %: 43.7 % (ref 43.0–77.0)
Platelets: 128 10*3/uL — ABNORMAL LOW (ref 150.0–400.0)
RBC: 4.1 Mil/uL — ABNORMAL LOW (ref 4.22–5.81)
RDW: 13.2 % (ref 11.5–15.5)
WBC: 5.6 10*3/uL (ref 4.0–10.5)

## 2020-02-29 LAB — PSA, MEDICARE: PSA: 1.13 ng/ml (ref 0.10–4.00)

## 2020-02-29 LAB — HEMOGLOBIN A1C: Hgb A1c MFr Bld: 5.2 % (ref 4.6–6.5)

## 2020-02-29 LAB — TSH: TSH: 3.93 u[IU]/mL (ref 0.35–4.50)

## 2020-03-07 ENCOUNTER — Other Ambulatory Visit: Payer: Self-pay

## 2020-03-07 ENCOUNTER — Ambulatory Visit (INDEPENDENT_AMBULATORY_CARE_PROVIDER_SITE_OTHER): Payer: Medicare HMO | Admitting: Family Medicine

## 2020-03-07 ENCOUNTER — Encounter: Payer: Self-pay | Admitting: Family Medicine

## 2020-03-07 VITALS — BP 136/70 | HR 88 | Temp 98.5°F | Ht 68.0 in | Wt 215.2 lb

## 2020-03-07 DIAGNOSIS — Z7289 Other problems related to lifestyle: Secondary | ICD-10-CM | POA: Diagnosis not present

## 2020-03-07 DIAGNOSIS — Z0000 Encounter for general adult medical examination without abnormal findings: Secondary | ICD-10-CM

## 2020-03-07 DIAGNOSIS — E7800 Pure hypercholesterolemia, unspecified: Secondary | ICD-10-CM | POA: Diagnosis not present

## 2020-03-07 DIAGNOSIS — R7303 Prediabetes: Secondary | ICD-10-CM

## 2020-03-07 DIAGNOSIS — F1090 Alcohol use, unspecified, uncomplicated: Secondary | ICD-10-CM

## 2020-03-07 DIAGNOSIS — Z125 Encounter for screening for malignant neoplasm of prostate: Secondary | ICD-10-CM

## 2020-03-07 DIAGNOSIS — M1711 Unilateral primary osteoarthritis, right knee: Secondary | ICD-10-CM

## 2020-03-07 DIAGNOSIS — D696 Thrombocytopenia, unspecified: Secondary | ICD-10-CM | POA: Diagnosis not present

## 2020-03-07 DIAGNOSIS — I10 Essential (primary) hypertension: Secondary | ICD-10-CM | POA: Diagnosis not present

## 2020-03-07 MED ORDER — ATORVASTATIN CALCIUM 40 MG PO TABS: ORAL_TABLET | ORAL | 3 refills | Status: DC

## 2020-03-07 MED ORDER — LISINOPRIL 10 MG PO TABS: 10.0000 mg | ORAL_TABLET | Freq: Every day | ORAL | 3 refills | Status: DC

## 2020-03-07 MED ORDER — AMLODIPINE BESYLATE 5 MG PO TABS: ORAL_TABLET | ORAL | 3 refills | Status: DC

## 2020-03-07 NOTE — Progress Notes (Signed)
Subjective:    Patient ID: [name], male    DOB: 02-Oct-1946, 74 y.o.   MRN: [id number]  This visit occurred during the SARS-CoV-2 public health emergency.  Safety protocols were in place, including screening questions prior to the visit, additional usage of staff PPE, and extensive cleaning of exam room while observing appropriate contact time as indicated for disinfecting solutions.    HPI Here for health maintenance exam and to review chronic medical problems    Wt Readings from Last 3 Encounters:  03/07/20 215 lb 3 oz (97.6 kg)  07/29/19 217 lb (98.4 kg)  05/13/19 215 lb 5 oz (97.7 kg)   32.72 kg/m   Pt declines medicare wellness exam   Keeping busy and taking care of himself   Flu vaccine 9/20 Had covid vaccine series Tdap 3/14 Zoster status -interested in it shingrix   pna vaccine completed both  Prostate health Lab Results  Component Value Date   PSA 1.13 02/29/2020   PSA 2.59 02/20/2019   PSA 1.43 02/06/2018  PGF had prostate cancer very late in life  No new family history  No urinary changes-nocturia times one   bp is stable today  No cp or palpitations or headaches or edema  No side effects to medicines  BP Readings from Last 3 Encounters:  03/07/20 (!) 148/70  07/29/19 128/74  05/13/19 (!) 160/90    bp is up on first check  Has not checked outside office   Better on 2nd check BP: 136/70  He does often take one advil per day   ETOH intake : - better than it was a year ago 3-4 drinks per day Is eating healthy -- has eaten more salads  , loosing a few lb  Knee problems - arthritis/had effusion  Had it tapped  Improved  Biking for exercise    Hyperlipidemia  Lab Results  Component Value Date   CHOL 181 02/29/2020   CHOL 214 (H) 02/20/2019   CHOL 211 (H) 02/06/2018   Lab Results  Component Value Date   HDL 57.40 02/29/2020   HDL 67.90 02/20/2019   HDL 65.80 02/06/2018   Lab Results  Component Value Date   LDLCALC 111 (H)  02/29/2020   LDLCALC 132 (H) 02/20/2019   LDLCALC 130 (H) 02/06/2018   Lab Results  Component Value Date   TRIG 63.0 02/29/2020   TRIG 72.0 02/20/2019   TRIG 77.0 02/06/2018   Lab Results  Component Value Date   CHOLHDL 3 02/29/2020   CHOLHDL 3 02/20/2019   CHOLHDL 3 02/06/2018   Lab Results  Component Value Date   LDLDIRECT 130.0 01/06/2013   LDLDIRECT 143.1 10/01/2011   LDLDIRECT 139.4 04/05/2010  atorvastatin  He has changed his diet somewhat  Less processed carbs   Some beef-not as much    Prediabetes  Lab Results  Component Value Date   HGBA1C 5.2 02/29/2020   Stable from 5.3   H/o thrombocytopenia Lab Results  Component Value Date   WBC 5.6 02/29/2020   HGB 13.8 02/29/2020   HCT 39.2 02/29/2020   MCV 95.8 02/29/2020   PLT 128.0 (L) 02/29/2020    Down from 140  He may bruise more easily than he used to   Lab Results  Component Value Date   TSH 3.93 02/29/2020    Patient Active Problem List   Diagnosis Date Noted  . Left knee pain 05/13/2019  . Alcohol intake above recommended sensible limits 05/13/2019  . Hyperlipidemia 02/25/2019  .  Colon cancer screening 02/12/2017  . Knee pain, right 12/26/2016  . Routine general medical examination at a health care facility 01/20/2016  . Heart murmur, systolic [id number]  . Encounter for Medicare annual wellness exam 09/24/2011  . Prostate cancer screening 09/24/2011  . Osteoarthritis, knee 02/14/2011  . TINNITUS 12/09/2009  . BACK PAIN, LUMBAR, WITH RADICULOPATHY 10/10/2009  . ESSENTIAL HYPERTENSION, BENIGN 08/16/2009  . Thrombocytopenia ([facility]) 02/15/2009  . Prediabetes 01/07/2008   Past Medical History:  Diagnosis Date  . Arthritis   . Back pain   . Diverticulosis   . Dyspnea    with exertion  . Heart murmur   . History of colon polyps   . Hyperlipidemia   . Hypertension    Past Surgical History:  Procedure Laterality Date  . COLONOSCOPY    . INGUINAL HERNIA REPAIR Bilateral 02/13/2017    Procedure: LAPAROSCOPIC BILATERAL INGUINAL HERNIA REPAIR WITH MESH;  Surgeon: Coralie Keens, MD;  Location: [facility];  Service: General;  Laterality: Bilateral;  . LASIK    . UMBILICAL HERNIA REPAIR N/A 02/13/2017   Procedure: LAPAROSCOPIC UMBILICAL HERNIA REPAIR;  Surgeon: Coralie Keens, MD;  Location: [facility];  Service: General;  Laterality: N/A;   Social History   Tobacco Use  . Smoking status: Never Smoker  . Smokeless tobacco: Never Used  Substance Use Topics  . Alcohol use: Yes    Alcohol/week: 21.0 - 28.0 standard drinks    Types: 21 - 28 Cans of beer per week    Comment: 3-4 cans of beer daily  . Drug use: No   Family History  Problem Relation Age of Onset  . Cancer Mother        brain tumor  . Emphysema Father   . Diabetes Father   . HIV Brother   . Colon cancer Neg Hx    Allergies  Allergen Reactions  . Ezetimibe-Simvastatin     REACTION: back pain   Current Outpatient Medications on File Prior to Visit  Medication Sig Dispense Refill  . Cyanocobalamin (B-12 PO) Take 1 tablet by mouth as needed.     . doxylamine, Sleep, (UNISOM) 25 MG tablet Take 25 mg by mouth at bedtime.    Marland Kitchen ibuprofen (ADVIL,MOTRIN) 200 MG tablet Take 400 mg by mouth 2 (two) times daily as needed for headache or moderate pain.     No current facility-administered medications on file prior to visit.    Review of Systems  Constitutional: Negative for activity change, appetite change, fatigue, fever and unexpected weight change.  HENT: Negative for congestion, rhinorrhea, sore throat and trouble swallowing.   Eyes: Negative for pain, redness, itching and visual disturbance.  Respiratory: Negative for cough, chest tightness, shortness of breath and wheezing.   Cardiovascular: Negative for chest pain and palpitations.  Gastrointestinal: Negative for abdominal pain, blood in stool, constipation, diarrhea and nausea.  Endocrine: Negative for cold intolerance, heat intolerance, polydipsia and  polyuria.  Genitourinary: Negative for difficulty urinating, dysuria, frequency and urgency.  Musculoskeletal: Positive for arthralgias. Negative for joint swelling and myalgias.       Knee pain   Skin: Negative for pallor and rash.  Neurological: Negative for dizziness, tremors, weakness, numbness and headaches.  Hematological: Negative for adenopathy. Does not bruise/bleed easily.  Psychiatric/Behavioral: Negative for decreased concentration and dysphoric mood. The patient is not nervous/anxious.        Objective:   Physical Exam Constitutional:      General: He is not in acute distress.    Appearance:  Normal appearance. He is well-developed. He is obese. He is not ill-appearing or diaphoretic.  HENT:     Head: Normocephalic and atraumatic.     Right Ear: Tympanic membrane, ear canal and external ear normal.     Left Ear: Tympanic membrane, ear canal and external ear normal.     Nose: Nose normal. No congestion.     Mouth/Throat:     Mouth: Mucous membranes are moist.     Pharynx: Oropharynx is clear. No posterior oropharyngeal erythema.  Eyes:     General: No scleral icterus.       Right eye: No discharge.        Left eye: No discharge.     Conjunctiva/sclera: Conjunctivae normal.     Pupils: Pupils are equal, round, and reactive to light.  Neck:     Thyroid: No thyromegaly.     Vascular: No carotid bruit or JVD.  Cardiovascular:     Rate and Rhythm: Normal rate and regular rhythm.     Pulses: Normal pulses.     Heart sounds: Normal heart sounds. No gallop.   Pulmonary:     Effort: Pulmonary effort is normal. No respiratory distress.     Breath sounds: Normal breath sounds. No wheezing or rales.     Comments: Good air exch Chest:     Chest wall: No tenderness.  Abdominal:     General: Bowel sounds are normal. There is no distension or abdominal bruit.     Palpations: Abdomen is soft. There is no mass.     Tenderness: There is no abdominal tenderness.     Hernia: No  hernia is present.  Musculoskeletal:        General: No tenderness.     Cervical back: Normal range of motion and neck supple. No rigidity. No muscular tenderness.     Right lower leg: No edema.     Left lower leg: No edema.     Comments: No acute joint changes  Lymphadenopathy:     Cervical: No cervical adenopathy.  Skin:    General: Skin is warm and dry.     Coloration: Skin is not pale.     Findings: No erythema or rash.     Comments: Tanned Solar lentigines diffusely Few sks  Neurological:     Mental Status: He is alert.     Cranial Nerves: No cranial nerve deficit.     Motor: No abnormal muscle tone.     Coordination: Coordination normal.     Gait: Gait normal.     Deep Tendon Reflexes: Reflexes are normal and symmetric. Reflexes normal.  Psychiatric:        Mood and Affect: Mood normal.        Cognition and Memory: Cognition and memory normal.           Assessment & Plan:   Problem List Items Addressed This Visit      Cardiovascular and Mediastinum   ESSENTIAL HYPERTENSION, BENIGN    bp in fair control at this time  BP Readings from Last 1 Encounters:  03/07/20 136/70   No changes needed Most recent labs reviewed  Disc lifstyle change with low sodium diet and exercise        Relevant Medications   amLODipine (NORVASC) 5 MG tablet   atorvastatin (LIPITOR) 40 MG tablet   lisinopril (ZESTRIL) 10 MG tablet     Musculoskeletal and Integument   Osteoarthritis, knee    Had effusion tapped/helpful  One advil  per day  Biking instead of walking  Has ortho for f/u as needed        Other   Prediabetes    Lab Results  Component Value Date   HGBA1C 5.2 02/29/2020   This is stable  Eating less processed carbs disc imp of low glycemic diet and wt loss to prevent DM2       Thrombocytopenia ([facility])    Platelet ct of 128  Continue to watch  Pt has no c/o bleeding or bruising      Prostate cancer screening    No clinical changes  Lab Results    Component Value Date   PSA 1.13 02/29/2020   PSA 2.59 02/20/2019   PSA 1.43 02/06/2018    GF with prostate cancer      Routine general medical examination at a health care facility - Primary    Reviewed health habits including diet and exercise and skin cancer prevention Reviewed appropriate screening tests for age  Also reviewed health mt list, fam hx and immunization status , as well as social and family history   See HPI Labs reviewed Pt declines amw this year  Enc to keep cutting back on etoh  Also wear sunscreen and stay active Wt loss enc  Disc shinrix -he will look into cost Has had his covid vaccines        Hyperlipidemia    Disc goals for lipids and reasons to control them Rev last labs with pt Rev low sat fat diet in detail  LDL was down from 130s to 111 Eating a bit better  Continues atorvastatin       Relevant Medications   amLODipine (NORVASC) 5 MG tablet   atorvastatin (LIPITOR) 40 MG tablet   lisinopril (ZESTRIL) 10 MG tablet   Alcohol intake above recommended sensible limits    Pt has cut back to 3-4 drinks per day Urged to get to 2 or less He seems interested in this   (no dependence)

## 2020-03-07 NOTE — Assessment & Plan Note (Signed)
Lab Results  Component Value Date   HGBA1C 5.2 02/29/2020   This is stable  Eating less processed carbs disc imp of low glycemic diet and wt loss to prevent DM2

## 2020-03-07 NOTE — Patient Instructions (Addendum)
If you are interested in the new shingles vaccine (Shingrix) - call your local pharmacy to check on coverage and availability  If affordable, get on a wait list at your pharmacy to get the vaccine.  Keep biking/exercising  Wear sunscreen Keep working on a healthy diet  Labs are stable   Keep loosing weight    Keep working on cutting alcohol intake (2 drinks per day is the maximum recommend)

## 2020-03-07 NOTE — Assessment & Plan Note (Signed)
No clinical changes  Lab Results  Component Value Date   PSA 1.13 02/29/2020   PSA 2.59 02/20/2019   PSA 1.43 02/06/2018    GF with prostate cancer

## 2020-03-07 NOTE — Assessment & Plan Note (Signed)
Disc goals for lipids and reasons to control them Rev last labs with pt Rev low sat fat diet in detail  LDL was down from 130s to 111 Eating a bit better  Continues atorvastatin

## 2020-03-07 NOTE — Assessment & Plan Note (Signed)
bp in fair control at this time  BP Readings from Last 1 Encounters:  03/07/20 136/70   No changes needed Most recent labs reviewed  Disc lifstyle change with low sodium diet and exercise

## 2020-03-07 NOTE — Assessment & Plan Note (Signed)
Had effusion tapped/helpful  One advil per day  Biking instead of walking  Has ortho for f/u as needed

## 2020-03-07 NOTE — Assessment & Plan Note (Signed)
Reviewed health habits including diet and exercise and skin cancer prevention Reviewed appropriate screening tests for age  Also reviewed health mt list, fam hx and immunization status , as well as social and family history   See HPI Labs reviewed Pt declines amw this year  Enc to keep cutting back on etoh  Also wear sunscreen and stay active Wt loss enc  Disc shinrix -he will look into cost Has had his covid vaccines

## 2020-03-07 NOTE — Assessment & Plan Note (Signed)
Platelet ct of 128  Continue to watch  Pt has no c/o bleeding or bruising

## 2020-03-07 NOTE — Assessment & Plan Note (Signed)
Pt has cut back to 3-4 drinks per day Urged to get to 2 or less He seems interested in this   (no dependence)

## 2020-03-31 ENCOUNTER — Encounter: Payer: Self-pay | Admitting: Family Medicine

## 2020-03-31 ENCOUNTER — Other Ambulatory Visit: Payer: Self-pay

## 2020-03-31 ENCOUNTER — Ambulatory Visit (INDEPENDENT_AMBULATORY_CARE_PROVIDER_SITE_OTHER): Payer: Medicare HMO | Admitting: Family Medicine

## 2020-03-31 VITALS — BP 140/70 | HR 83 | Temp 97.2°F | Ht 68.0 in | Wt 215.2 lb

## 2020-03-31 DIAGNOSIS — M1712 Unilateral primary osteoarthritis, left knee: Secondary | ICD-10-CM

## 2020-03-31 NOTE — Progress Notes (Signed)
Ashely Joshua T. Amoree Newlon, MD, [place]  Primary Care and [place] at [facility] [place], [address]  Phone: [phone]  FAX: [phone]  [name] - 74 y.o. male  MRN [id number]  Date of Birth: [birth year]  Date: 03/31/2020  PCP: Abner Greenspan, MD  Referral: Abner Greenspan, MD  Chief Complaint  Patient presents with  . Knee Pain    Left    This visit occurred during the SARS-CoV-2 public health emergency.  Safety protocols were in place, including screening questions prior to the visit, additional usage of staff PPE, and extensive cleaning of exam room while observing appropriate contact time as indicated for disinfecting solutions.   Subjective:   [name] is a 74 y.o. very pleasant male patient with Body mass index is 32.73 kg/m. who presents with the following:  Going up to [place], his trip is going to be at the end of July.  He has had some knee pain off and on for some time, and I saw him 1 year ago and aspirated his knee and injected his knee with some corticosteroid.  He did get some significant relief with that, but he has had some mild underlying symptoms for quite some time.  He does like to hike and he walks his dog about 2 miles in the morning.  Does like to walk about 5 miles a day, but this has been flaring his knee.  As he transitions to the stationary bike and elliptical he does think that this helps with some pain with his knee  Dog walk in the AM and then ice Goes to the gym Doing stationary bike and elliptical.   Trying go lose weight.   Tries some glucosamine. Did not really help.   Review of Systems is noted in the HPI, as appropriate   Objective:   BP 140/70   Pulse 83   Temp (!) 97.2 F (36.2 C) (Temporal)   Ht 5\' 8"  (1.727 m)   Wt 215 lb 4 oz (97.6 kg)   SpO2 97%   BMI 32.73 kg/m    GEN: No acute distress; alert,appropriate. PULM: Breathing comfortably  in no respiratory distress PSYCH: Normally interactive.    Left knee: Full extension flexion to 120 degrees.  No pain with loading the medial lateral patellar facets.  Notable crepitus.  Medial joint line tenderness greater than lateral joint line tenderness.  ACL, PCL, and MCL, and LCL are all intact.  Bounce home test is negative, but he does have some significant pain with flexion pinch testing as well as McMurray's testing, but he does not have any mechanical symptoms.  Radiology: DG Knee 4 Views W/Patella Left  Result Date: 05/13/2019 CLINICAL DATA:  Acute left knee pain after injury. EXAM: LEFT KNEE - COMPLETE 4+ VIEW COMPARISON:  None. FINDINGS: No evidence of fracture, dislocation, or joint effusion. Mild patellar spurring is noted. Mild narrowing of lateral joint space is noted. Soft tissues are unremarkable. IMPRESSION: Mild degenerative joint disease is noted laterally. No acute abnormality seen in the left knee. Electronically Signed   By: Marijo Conception M.D.   On: 05/13/2019 14:57   Assessment and Plan:     ICD-10-CM   1. Primary osteoarthritis of left knee  M17.12    Total encounter time: 30 minutes. On the day of the patient encounter, this can include review of prior records, labs, and imaging.  Additional time can include counselling, consultation  with peer MD in person or by telephone.  This also includes independent review of Radiology.   Mild to moderate osteoarthritis flare.  I reviewed basic care for long-term management of osteoarthritis with the patient.  Reviewed his chart and pulled up his x-rays with the patient again.  We reviewed them face-to-face, and he does have some joint space narrowing medially and laterally as well as some osteophytosis.  He also has some patellofemoral osteoarthritis.  Tricompartmental osteoarthritis without evidence of acute fracture or dislocation.Electronically Signed  By: Owens Loffler, MD On: 03/31/2020  9:00 AM EDT   For now are  going to add some over-the-counter supplements as well as some topical Voltaren gel, and he is going to follow-up before his trip to [place].  Follow-up: Return for follow-up about 10 days before your trip to [place].  No orders of the defined types were placed in this encounter.  There are no discontinued medications. No orders of the defined types were placed in this encounter.   Signed,  Maud Deed. Carvel Huskins, MD   Outpatient Encounter Medications as of 03/31/2020  Medication Sig  . amLODipine (NORVASC) 5 MG tablet TAKE 1 TABLET BY MOUTH EVERY DAY IN THE EVENING  . atorvastatin (LIPITOR) 40 MG tablet TAKE 1 TABLET BY MOUTH EVERY DAY IN THE EVENING  . Cyanocobalamin (B-12 PO) Take 1 tablet by mouth as needed.   . doxylamine, Sleep, (UNISOM) 25 MG tablet Take 25 mg by mouth at bedtime.  Marland Kitchen ibuprofen (ADVIL,MOTRIN) 200 MG tablet Take 400 mg by mouth 2 (two) times daily as needed for headache or moderate pain.  Marland Kitchen lisinopril (ZESTRIL) 10 MG tablet Take 1 tablet (10 mg total) by mouth daily. In am   No facility-administered encounter medications on file as of 03/31/2020.

## 2020-03-31 NOTE — Patient Instructions (Signed)
OSTEOARTHRITIS: Over the counter  Tylenol: 2 tablets up to 3-4 times a day Regular NSAIDS are helpful (avoid in kidney disease and ulcers)  Supplements: Tart cherry juice and Curcumin (Turmeric extract) have good scientific evidence  - get the concentrated capsules or gelcaps over the counter so you do not get the calories from the juice.  Weight loss will always take stress off of the joints and back  Volaren 1% gel. Over the counter You can apply up to 4 times a day Minimal is absorbed in the bloodstream Cost is about 9 dollars  Ice joints on bad days, 20 min, 2-3 x / day REGULAR EXERCISE: swimming, Yoga, Tai Chi, bicycle (NON-IMPACT activity)

## 2020-04-07 ENCOUNTER — Other Ambulatory Visit: Payer: Self-pay

## 2020-04-07 ENCOUNTER — Ambulatory Visit: Payer: Medicare HMO | Admitting: Sports Medicine

## 2020-04-07 VITALS — BP 147/82 | Ht 70.0 in | Wt 209.0 lb

## 2020-04-07 DIAGNOSIS — M1712 Unilateral primary osteoarthritis, left knee: Secondary | ICD-10-CM | POA: Diagnosis not present

## 2020-04-07 MED ORDER — METHYLPREDNISOLONE ACETATE 40 MG/ML IJ SUSP
40.0000 mg | Freq: Once | INTRAMUSCULAR | Status: AC
  Administered 2020-04-07: 40 mg via INTRA_ARTICULAR

## 2020-04-07 NOTE — Progress Notes (Signed)
Natnael Biederman - 74 y.o. male MRN [id number]  Date of birth: [birth year]  SUBJECTIVE:   CC: left knee pain   74 year old male presenting with left knee pain for the past year.  He has known osteoarthritis, with worse DJD in his lateral compartment with spurring on lateral view.  He saw Dr. Jackie Plum last week and had a is none knee aspirated per his report although I do not see this in the notes.  He says that his knee was not injected with steroid.  He has been trying tart cherry and Q cumin.  He stopped glucosamine as he did not think it was helping and when he stopped it his cholesterol lowered.  He takes 200 mg ibuprofen daily.  He is presenting today to see if there are any other options as he enjoys hiking and he feels like he is holding his wife back when they go on their hikes.  He also walks his dog several miles daily.  He has been doing the stationary bike is also been doing leg extension exercises to help his knee.  ROS: No unexpected weight loss, fever, chills, swelling, instability, muscle pain, numbness/tingling, redness, otherwise see HPI   PMHx - Updated and reviewed.  Contributory factors include: Negative PSHx - Updated and reviewed.  Contributory factors include:  Negative FHx - Updated and reviewed.  Contributory factors include:  Negative Social Hx - Updated and reviewed. Contributory factors include: Negative Medications - reviewed   DATA REVIEWED: Prior records  PHYSICAL EXAM:  VS: BP:(!) 147/82  HR: bpm  TEMP: ( )  RESP:   HT:5\' 10"  (177.8 cm)   WT:209 lb (94.8 kg)  BMI:29.99 PHYSICAL EXAM: Gen: NAD, alert, cooperative with exam, well-appearing HEENT: clear conjunctiva,  CV:  no edema, capillary refill brisk, normal rate Resp: non-labored Skin: no rashes, normal turgor  Neuro: no gross deficits.  Psych:  alert and oriented  Knee: - Inspection: no gross deformity. No swelling/effusion, erythema or bruising. Skin intact - Palpation: 2+ crepitus. TTP over  posterior medial joint line - ROM: full active ROM with flexion and extension in knee and hip - Strength: 5/5 strength - Neuro/vasc: NV intact - Special Tests: - LIGAMENTS: negative anterior and posterior drawer, negative Lachman's, no MCL or LCL laxity  -- MENISCUS: negative McMurray's -- PF JOINT: nml patellar mobility bilaterally.    Hips: normal ROM, negative FABER and FADIR bilaterally    ASSESSMENT & PLAN:  Chronic left knee pain- Reviewed x-rays that showed tricompartmental osteoarthritis of left knee.  Patient is already taking anti-inflammatory supplements (tart cherry juice and curcumin) and is doing stationary bike and knee extension exercises.  He elected for corticosteroid injection today as he has a trip to [place] in 1 month that he hopes to feel good for.  He felt good relief during the anesthetic phase.  Return as needed.  Procedure performed: knee intraarticular corticosteroid injection; palpation guided  Consent obtained and verified. Time-out conducted. Noted no overlying erythema, induration, or other signs of local infection. The lateral suprapatellar joint space was palpated and marked. The overlying skin was prepped in a sterile fashion. Topical analgesic spray: Ethyl chloride. Joint: left knee Needle: 25 gauge, 1.5 inch Completed without difficulty. Meds: 40 mg methylrprednisolone, 3 ml 1% lidocaine without epinephrine  Advised to call if fevers/chills, erythema, induration, drainage, or persistent bleeding.   Patient seen and evaluated with the sports medicine fellow. I agree with the above plan of care. Future considerations could include viscosupplementation or physical therapy.  Follow-up for ongoing or recalcitrant issues.

## 2020-04-12 DIAGNOSIS — H524 Presbyopia: Secondary | ICD-10-CM | POA: Diagnosis not present

## 2020-04-26 DIAGNOSIS — R69 Illness, unspecified: Secondary | ICD-10-CM | POA: Diagnosis not present

## 2020-08-01 DIAGNOSIS — R69 Illness, unspecified: Secondary | ICD-10-CM | POA: Diagnosis not present

## 2020-08-30 DIAGNOSIS — R69 Illness, unspecified: Secondary | ICD-10-CM | POA: Diagnosis not present

## 2020-12-12 ENCOUNTER — Encounter: Payer: Self-pay | Admitting: Gastroenterology

## 2021-02-28 ENCOUNTER — Telehealth: Payer: Self-pay | Admitting: Family Medicine

## 2021-02-28 DIAGNOSIS — I10 Essential (primary) hypertension: Secondary | ICD-10-CM

## 2021-02-28 DIAGNOSIS — Z125 Encounter for screening for malignant neoplasm of prostate: Secondary | ICD-10-CM

## 2021-02-28 DIAGNOSIS — E7800 Pure hypercholesterolemia, unspecified: Secondary | ICD-10-CM

## 2021-02-28 DIAGNOSIS — Z0000 Encounter for general adult medical examination without abnormal findings: Secondary | ICD-10-CM

## 2021-02-28 DIAGNOSIS — D696 Thrombocytopenia, unspecified: Secondary | ICD-10-CM

## 2021-02-28 DIAGNOSIS — R7303 Prediabetes: Secondary | ICD-10-CM

## 2021-02-28 NOTE — Telephone Encounter (Signed)
-----   Message from Ellamae Sia sent at 02/14/2021 12:40 PM EDT ----- Regarding: Lab orders for Wednesday, 5.4.22 Patient is scheduled for CPX labs, please order future labs, Thanks , Karna Christmas

## 2021-03-01 ENCOUNTER — Other Ambulatory Visit: Payer: Self-pay

## 2021-03-01 ENCOUNTER — Other Ambulatory Visit (INDEPENDENT_AMBULATORY_CARE_PROVIDER_SITE_OTHER): Payer: Medicare HMO

## 2021-03-01 DIAGNOSIS — E7800 Pure hypercholesterolemia, unspecified: Secondary | ICD-10-CM

## 2021-03-01 DIAGNOSIS — R7303 Prediabetes: Secondary | ICD-10-CM

## 2021-03-01 DIAGNOSIS — Z125 Encounter for screening for malignant neoplasm of prostate: Secondary | ICD-10-CM | POA: Diagnosis not present

## 2021-03-01 DIAGNOSIS — I10 Essential (primary) hypertension: Secondary | ICD-10-CM

## 2021-03-01 DIAGNOSIS — D696 Thrombocytopenia, unspecified: Secondary | ICD-10-CM | POA: Diagnosis not present

## 2021-03-01 LAB — COMPREHENSIVE METABOLIC PANEL
ALT: 26 U/L (ref 0–53)
AST: 26 U/L (ref 0–37)
Albumin: 4.8 g/dL (ref 3.5–5.2)
Alkaline Phosphatase: 56 U/L (ref 39–117)
BUN: 16 mg/dL (ref 6–23)
CO2: 27 mEq/L (ref 19–32)
Calcium: 9.9 mg/dL (ref 8.4–10.5)
Chloride: 105 mEq/L (ref 96–112)
Creatinine, Ser: 0.84 mg/dL (ref 0.40–1.50)
GFR: 87.11 mL/min (ref >60.00)
Glucose, Bld: 116 mg/dL — ABNORMAL HIGH (ref 70–99)
Potassium: 4.8 mEq/L (ref 3.5–5.1)
Sodium: 140 mEq/L (ref 135–145)
Total Bilirubin: 0.9 mg/dL (ref 0.2–1.2)
Total Protein: 7.1 g/dL (ref 6.0–8.3)

## 2021-03-01 LAB — PSA, MEDICARE: PSA: 1.5 ng/ml (ref 0.10–4.00)

## 2021-03-01 LAB — LIPID PANEL
Cholesterol: 217 mg/dL — ABNORMAL HIGH (ref 0–200)
HDL: 73.5 mg/dL (ref >39.00)
LDL Cholesterol: 129 mg/dL — ABNORMAL HIGH (ref 0–99)
NonHDL: 143.95
Total CHOL/HDL Ratio: 3
Triglycerides: 74 mg/dL (ref 0.0–149.0)
VLDL: 14.8 mg/dL (ref 0.0–40.0)

## 2021-03-01 LAB — TSH: TSH: 4.58 u[IU]/mL — ABNORMAL HIGH (ref 0.35–4.50)

## 2021-03-01 LAB — HEMOGLOBIN A1C: Hgb A1c MFr Bld: 5.3 % (ref 4.6–6.5)

## 2021-03-01 NOTE — Telephone Encounter (Signed)
Error

## 2021-03-01 NOTE — Addendum Note (Signed)
Addended by: Cloyd Stagers on: 03/01/2021 07:56 AM   Modules accepted: Orders

## 2021-03-02 LAB — CBC WITH DIFFERENTIAL/PLATELET
Absolute Monocytes: 536 cells/uL (ref 200–950)
Basophils Absolute: 61 cells/uL (ref 0–200)
Basophils Relative: 1.3 %
Eosinophils Absolute: 263 cells/uL (ref 15–500)
Eosinophils Relative: 5.6 %
HCT: 41.1 % (ref 38.5–50.0)
Hemoglobin: 14.1 g/dL (ref 13.2–17.1)
Lymphs Abs: 2063 cells/uL (ref 850–3900)
MCH: 33.3 pg — ABNORMAL HIGH (ref 27.0–33.0)
MCHC: 34.3 g/dL (ref 32.0–36.0)
MCV: 97.2 fL (ref 80.0–100.0)
MPV: 12.2 fL (ref 7.5–12.5)
Monocytes Relative: 11.4 %
Neutro Abs: 1777 cells/uL (ref 1500–7800)
Neutrophils Relative %: 37.8 %
Platelets: 140 10*3/uL (ref 140–400)
RBC: 4.23 10*6/uL (ref 4.20–5.80)
RDW: 13.3 % (ref 11.0–15.0)
Total Lymphocyte: 43.9 %
WBC: 4.7 10*3/uL (ref 3.8–10.8)

## 2021-03-02 LAB — PATHOLOGIST SMEAR REVIEW

## 2021-03-03 ENCOUNTER — Other Ambulatory Visit: Payer: Self-pay | Admitting: Family Medicine

## 2021-03-08 ENCOUNTER — Ambulatory Visit (INDEPENDENT_AMBULATORY_CARE_PROVIDER_SITE_OTHER): Payer: Medicare HMO | Admitting: Family Medicine

## 2021-03-08 ENCOUNTER — Other Ambulatory Visit: Payer: Self-pay

## 2021-03-08 ENCOUNTER — Encounter: Payer: Self-pay | Admitting: Family Medicine

## 2021-03-08 VITALS — BP 133/70 | HR 66 | Temp 97.0°F | Ht 68.0 in | Wt 217.0 lb

## 2021-03-08 DIAGNOSIS — R7989 Other specified abnormal findings of blood chemistry: Secondary | ICD-10-CM | POA: Diagnosis not present

## 2021-03-08 DIAGNOSIS — E7800 Pure hypercholesterolemia, unspecified: Secondary | ICD-10-CM | POA: Diagnosis not present

## 2021-03-08 DIAGNOSIS — F1090 Alcohol use, unspecified, uncomplicated: Secondary | ICD-10-CM

## 2021-03-08 DIAGNOSIS — I10 Essential (primary) hypertension: Secondary | ICD-10-CM | POA: Diagnosis not present

## 2021-03-08 DIAGNOSIS — Z7289 Other problems related to lifestyle: Secondary | ICD-10-CM

## 2021-03-08 DIAGNOSIS — D696 Thrombocytopenia, unspecified: Secondary | ICD-10-CM

## 2021-03-08 DIAGNOSIS — R7303 Prediabetes: Secondary | ICD-10-CM

## 2021-03-08 DIAGNOSIS — Z125 Encounter for screening for malignant neoplasm of prostate: Secondary | ICD-10-CM

## 2021-03-08 DIAGNOSIS — Z86010 Personal history of colonic polyps: Secondary | ICD-10-CM

## 2021-03-08 DIAGNOSIS — Z0000 Encounter for general adult medical examination without abnormal findings: Secondary | ICD-10-CM | POA: Diagnosis not present

## 2021-03-08 DIAGNOSIS — E039 Hypothyroidism, unspecified: Secondary | ICD-10-CM | POA: Insufficient documentation

## 2021-03-08 NOTE — Assessment & Plan Note (Signed)
Lab Results  Component Value Date   TSH 4.58 (H) 03/01/2021    No new symptoms  Will re check in 1-2 mo with FT4

## 2021-03-08 NOTE — Progress Notes (Signed)
Subjective:    Patient ID: [name], male    DOB: [birth year], 75 y.o.   MRN: [id number]  'This visit occurred during the SARS-CoV-2 public health emergency.  Safety protocols were in place, including screening questions prior to the visit, additional usage of staff PPE, and extensive cleaning of exam room while observing appropriate contact time as indicated for disinfecting solutions.    HPI Here for health maintenance exam and to review chronic medical problems    Wt Readings from Last 3 Encounters:  03/08/21 217 lb (98.4 kg)  04/07/20 209 lb (94.8 kg)  03/31/20 215 lb 4 oz (97.6 kg)   32.99 kg/m   Feeling ok  L knee is still problematic- arthritis  Has had some injections  Has not had gel yet  Loves to hike and be active-not able to  Compression does not help a lot   Exercise- let weights and elliptical at gym  Then does water exercise    He declines amw  Doing health mt today  Colonoscopy 10/18  Due for 3 y recall  Flu shot -did get in the fall  Tdap 3/14 pna vaccines utd Zoster status - has not had the shingrix yet  covid vaccinated with boosters   Prostate health  Lab Results  Component Value Date   PSA 1.50 03/01/2021   PSA 1.13 02/29/2020   PSA 2.59 02/20/2019  no change in voiding habits  Nocturia times one occ Mora Bellman not   PGF had prostate cancer very late in lift  Alcohol intake-had cut back last year  Now average intake is 4-5 beers and glass of wine Wants to cut back more  No mood change, just a habit Does not feel dependent Does not crave it   HTN bp is stable today  No cp or palpitations or headaches or edema  No side effects to medicines  BP Readings from Last 3 Encounters:  03/08/21 133/70  04/07/20 (!) 147/82  03/31/20 140/70     Takes amlodipine 5 mg daily  Lisinopril 10 mg daily  Diet is good  More salads  Less processed   Pulse Readings from Last 3 Encounters:  03/08/21 66  03/31/20 83  03/07/20 88     Lab Results  Component Value Date   CREATININE 0.84 03/01/2021   BUN 16 03/01/2021   NA 140 03/01/2021   K 4.8 03/01/2021   CL 105 03/01/2021   CO2 27 03/01/2021   Prediabetes Lab Results  Component Value Date   HGBA1C 5.3 03/01/2021   This is stable  Glucose was 116 fasting  Uses multi grain products and avoids white bread    H/o low platelets Lab Results  Component Value Date   WBC 4.7 03/01/2021   HGB 14.1 03/01/2021   HCT 41.1 03/01/2021   MCV 97.2 03/01/2021   PLT 140 03/01/2021  nl plt ct this draw  Hyperlipidemia Lab Results  Component Value Date   CHOL 217 (H) 03/01/2021   CHOL 181 02/29/2020   CHOL 214 (H) 02/20/2019   Lab Results  Component Value Date   HDL 73.50 03/01/2021   HDL 57.40 02/29/2020   HDL 67.90 02/20/2019   Lab Results  Component Value Date   LDLCALC 129 (H) 03/01/2021   LDLCALC 111 (H) 02/29/2020   LDLCALC 132 (H) 02/20/2019   Lab Results  Component Value Date   TRIG 74.0 03/01/2021   TRIG 63.0 02/29/2020   TRIG 72.0 02/20/2019   Lab Results  Component Value  Date   CHOLHDL 3 03/01/2021   CHOLHDL 3 02/29/2020   CHOLHDL 3 02/20/2019   Lab Results  Component Value Date   LDLDIRECT 130.0 01/06/2013   LDLDIRECT 143.1 10/01/2011   LDLDIRECT 139.4 04/05/2010   Taking atorvastatin 40 mg daily -compliant  LDL is up  Is taking glucosamine for his knee   tsh is up today  Lab Results  Component Value Date   TSH 4.58 (H) 03/01/2021     Has a heart M 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 Elevated TSH 03/08/2021  . History of colon polyps 03/08/2021  . Left knee pain 05/13/2019  . Alcohol intake above recommended sensible limits 05/13/2019  . Hyperlipidemia 02/25/2019  . Colon cancer screening 02/12/2017  . Knee pain, right 12/26/2016  . Routine general medical examination at a health care facility 01/20/2016  . Heart murmur, systolic 08/14/5101  .  Encounter for Medicare annual wellness exam 09/24/2011  . Prostate cancer screening 09/24/2011  . Osteoarthritis, knee 02/14/2011  . TINNITUS 12/09/2009  . BACK PAIN, LUMBAR, WITH RADICULOPATHY 10/10/2009  . ESSENTIAL HYPERTENSION, BENIGN 08/16/2009  . Thrombocytopenia ([place]) 02/15/2009  . Prediabetes 01/07/2008   Past Medical History:  Diagnosis Date  . Arthritis   . Back pain   . Diverticulosis   . Dyspnea    with exertion  . Heart murmur   . History of colon polyps   . Hyperlipidemia   . Hypertension    Past Surgical History:  Procedure Laterality Date  . COLONOSCOPY    . INGUINAL HERNIA REPAIR Bilateral 02/13/2017   Procedure: LAPAROSCOPIC BILATERAL INGUINAL HERNIA REPAIR WITH MESH;  Surgeon: Coralie Keens, MD;  Location: [facility];  Service: General;  Laterality: Bilateral;  . LASIK    . UMBILICAL HERNIA REPAIR N/A 02/13/2017   Procedure: LAPAROSCOPIC UMBILICAL HERNIA REPAIR;  Surgeon: Coralie Keens, MD;  Location: [facility];  Service: General;  Laterality: N/A;   Social History   Tobacco Use  . Smoking status: Never Smoker  . Smokeless tobacco: Never Used  Vaping Use  . Vaping Use: Never used  Substance Use Topics  . Alcohol use: Yes    Alcohol/week: 21.0 - 28.0 standard drinks    Types: 21 - 28 Cans of beer per week    Comment: 3-4 cans of beer daily  . Drug use: No   Family History  Problem Relation Age of Onset  . Cancer Mother        brain tumor  . Emphysema Father   . Diabetes Father   . HIV Brother   . Colon cancer Neg Hx    Allergies  Allergen Reactions  . Ezetimibe-Simvastatin     REACTION: back pain   Current Outpatient Medications on File Prior to Visit  Medication Sig Dispense Refill  . amLODipine (NORVASC) 5 MG tablet TAKE 1 TABLET BY MOUTH EVERY DAY IN THE EVENING 90 tablet 0  . atorvastatin (LIPITOR) 40 MG tablet TAKE 1 TABLET BY MOUTH EVERY DAY IN THE EVENING 90 tablet 0  . Cyanocobalamin (B-12 PO) Take 1 tablet by mouth as needed.     .  doxylamine, Sleep, (UNISOM) 25 MG tablet Take 25 mg by mouth at bedtime.    Marland Kitchen ibuprofen (ADVIL,MOTRIN) 200 MG tablet Take 400 mg by mouth 2 (two) times daily as needed for headache or moderate pain.    Marland Kitchen lisinopril (ZESTRIL) 10 MG tablet Take 1 tablet (  10 mg total) by mouth daily. In am 90 tablet 3   No current facility-administered medications on file prior to visit.    Review of Systems  Constitutional: Negative for activity change, appetite change, fatigue, fever and unexpected weight change.  HENT: Negative for congestion, rhinorrhea, sore throat and trouble swallowing.   Eyes: Negative for pain, redness, itching and visual disturbance.  Respiratory: Negative for cough, chest tightness, shortness of breath and wheezing.   Cardiovascular: Negative for chest pain and palpitations.  Gastrointestinal: Negative for abdominal pain, blood in stool, constipation, diarrhea and nausea.  Endocrine: Negative for cold intolerance, heat intolerance, polydipsia and polyuria.  Genitourinary: Negative for difficulty urinating, dysuria, frequency and urgency.  Musculoskeletal: Positive for arthralgias. Negative for joint swelling and myalgias.  Skin: Negative for pallor and rash.  Neurological: Negative for dizziness, tremors, weakness, numbness and headaches.  Hematological: Negative for adenopathy. Does not bruise/bleed easily.  Psychiatric/Behavioral: Negative for decreased concentration and dysphoric mood. The patient is not nervous/anxious.        Objective:   Physical Exam Constitutional:      General: He is not in acute distress.    Appearance: Normal appearance. He is well-developed. He is obese. He is not ill-appearing or diaphoretic.  HENT:     Head: Normocephalic and atraumatic.     Right Ear: Tympanic membrane, ear canal and external ear normal.     Left Ear: Tympanic membrane, ear canal and external ear normal.     Nose: Nose normal. No congestion.     Mouth/Throat:     Mouth: Mucous  membranes are moist.     Pharynx: Oropharynx is clear. No posterior oropharyngeal erythema.  Eyes:     General: No scleral icterus.       Right eye: No discharge.        Left eye: No discharge.     Conjunctiva/sclera: Conjunctivae normal.     Pupils: Pupils are equal, round, and reactive to light.  Neck:     Thyroid: No thyromegaly.     Vascular: No carotid bruit or JVD.  Cardiovascular:     Rate and Rhythm: Normal rate and regular rhythm.     Pulses: Normal pulses.     Heart sounds: Normal heart sounds. No gallop.   Pulmonary:     Effort: Pulmonary effort is normal. No respiratory distress.     Breath sounds: Normal breath sounds. No wheezing or rales.     Comments: Good air exch Chest:     Chest wall: No tenderness.  Abdominal:     General: Bowel sounds are normal. There is no distension or abdominal bruit.     Palpations: Abdomen is soft. There is no mass.     Tenderness: There is no abdominal tenderness.     Hernia: No hernia is present.  Musculoskeletal:        General: No tenderness.     Cervical back: Normal range of motion and neck supple. No rigidity. No muscular tenderness.     Right lower leg: No edema.     Left lower leg: No edema.  Lymphadenopathy:     Cervical: No cervical adenopathy.  Skin:    General: Skin is warm and dry.     Coloration: Skin is not pale.     Findings: No erythema or rash.     Comments: Solar lentigines diffusely tanned  Neurological:     Mental Status: He is alert.     Cranial Nerves: No cranial nerve deficit.  Motor: No abnormal muscle tone.     Coordination: Coordination normal.     Gait: Gait normal.     Deep Tendon Reflexes: Reflexes are normal and symmetric. Reflexes normal.  Psychiatric:        Mood and Affect: Mood normal.        Cognition and Memory: Cognition and memory normal.           Assessment & Plan:   Problem List Items Addressed This Visit      Cardiovascular and Mediastinum   ESSENTIAL HYPERTENSION,  BENIGN    bp in fair control at this time  BP Readings from Last 3 Encounters:  03/08/21 133/70  04/07/20 (!) 147/82  03/31/20 140/70    No changes needed Most recent labs reviewed  Disc lifstyle change with low sodium diet and exercise  Plan to continue amlodipine 5 mg daily and lisinopril 10 mg daily         Other   Prediabetes    Lab Results  Component Value Date   HGBA1C 5.3 03/01/2021   Stable disc imp of low glycemic diet and wt loss to prevent DM2       Thrombocytopenia (HCC)    Nl cbc today  Platelet ct of 140        Prostate cancer screening    Lab Results  Component Value Date   PSA 1.50 03/01/2021   PSA 1.13 02/29/2020   PSA 2.59 02/20/2019    No voiding symptoms  Prostate cancer in GF at adv age      Routine general medical examination at a health care facility - Primary    Reviewed health habits including diet and exercise and skin cancer prevention Reviewed appropriate screening tests for age  Also reviewed health mt list, fam hx and immunization status , as well as social and family history   See HPI Labs reviewed Ref for recall colonoscopy  psa reviewed Discussed shingrix vaccine-will get if affordable Counseled re: etoh use Plan to re check TSH with FT4 in 1-2 mo      Hyperlipidemia    Cholesterol is up a bit-possibly due to glucosamine supplement  Disc goals for lipids and reasons to control them Rev last labs with pt Rev low sat fat diet in detail HDL is also up-favorable Continues atorvastatin 40 mg daily      Alcohol intake above recommended sensible limits    Drinks 4-5 per day (beer/wine)  Disc reasons to cut back Pt denies cravings or withdrawal or feeling of dependence       Elevated TSH    Lab Results  Component Value Date   TSH 4.58 (H) 03/01/2021    No new symptoms  Will re check in 1-2 mo with FT4      Relevant Orders   TSH   T4, free   History of colon polyps    Unclear if 3 or 5 y recall from  colonoscopy 2018 Referral done      Relevant Orders   Ambulatory referral to Gastroenterology

## 2021-03-08 NOTE — Assessment & Plan Note (Signed)
Nl cbc today  Platelet ct of 140

## 2021-03-08 NOTE — Assessment & Plan Note (Signed)
Lab Results  Component Value Date   PSA 1.50 03/01/2021   PSA 1.13 02/29/2020   PSA 2.59 02/20/2019    No voiding symptoms  Prostate cancer in GF at adv age

## 2021-03-08 NOTE — Assessment & Plan Note (Signed)
Drinks 4-5 per day (beer/wine)  Disc reasons to cut back Pt denies cravings or withdrawal or feeling of dependence

## 2021-03-08 NOTE — Assessment & Plan Note (Signed)
Lab Results  Component Value Date   HGBA1C 5.3 03/01/2021   Stable disc imp of low glycemic diet and wt loss to prevent DM2

## 2021-03-08 NOTE — Assessment & Plan Note (Signed)
Reviewed health habits including diet and exercise and skin cancer prevention Reviewed appropriate screening tests for age  Also reviewed health mt list, fam hx and immunization status , as well as social and family history   See HPI Labs reviewed Ref for recall colonoscopy  psa reviewed Discussed shingrix vaccine-will get if affordable Counseled re: etoh use Plan to re check TSH with FT4 in 1-2 mo

## 2021-03-08 NOTE — Assessment & Plan Note (Addendum)
bp in fair control at this time  BP Readings from Last 3 Encounters:  03/08/21 133/70  04/07/20 (!) 147/82  03/31/20 140/70    No changes needed Most recent labs reviewed  Disc lifstyle change with low sodium diet and exercise  Plan to continue amlodipine 5 mg daily and lisinopril 10 mg daily

## 2021-03-08 NOTE — Patient Instructions (Addendum)
I placed a referral to see if your colonoscopy is due yet   If you are interested in the new shingles vaccine (Shingrix) - call your local pharmacy to check on coverage and availability  If affordable, get on a wait list at your pharmacy to get the vaccine.  Try to get most of your carbohydrates from produce (with the exception of white potatoes)  Eat less bread/pasta/rice/snack foods/cereals/sweets and other items from the middle of the grocery store (processed carbs)  Set up labs in 1-2 months for thyroid   Wear sunscreen to prevent skin cancer

## 2021-03-08 NOTE — Assessment & Plan Note (Signed)
Unclear if 3 or 5 y recall from colonoscopy 2018 Referral done

## 2021-03-08 NOTE — Assessment & Plan Note (Signed)
Cholesterol is up a bit-possibly due to glucosamine supplement  Disc goals for lipids and reasons to control them Rev last labs with pt Rev low sat fat diet in detail HDL is also up-favorable Continues atorvastatin 40 mg daily

## 2021-03-30 ENCOUNTER — Other Ambulatory Visit: Payer: Self-pay | Admitting: Family Medicine

## 2021-04-24 ENCOUNTER — Other Ambulatory Visit (INDEPENDENT_AMBULATORY_CARE_PROVIDER_SITE_OTHER): Payer: Medicare HMO

## 2021-04-24 ENCOUNTER — Other Ambulatory Visit: Payer: Self-pay

## 2021-04-24 DIAGNOSIS — R7989 Other specified abnormal findings of blood chemistry: Secondary | ICD-10-CM | POA: Diagnosis not present

## 2021-04-24 LAB — TSH: TSH: 4.88 u[IU]/mL — ABNORMAL HIGH (ref 0.35–4.50)

## 2021-04-24 LAB — T4, FREE: Free T4: 0.55 ng/dL — ABNORMAL LOW (ref 0.60–1.60)

## 2021-04-25 ENCOUNTER — Other Ambulatory Visit: Payer: Self-pay | Admitting: *Deleted

## 2021-04-25 NOTE — Telephone Encounter (Signed)
-----   Message from Abner Greenspan, MD sent at 04/24/2021  8:38 PM EDT ----- Labs indicate hypothyroidism  Please send in levothyroxine 25 mcg 1 po qd #30 3 ref  This may help energy level or aches and pains  Take first thing in am at least 30 min before food/meds or supplements  Re check TSH in 6 wk

## 2021-04-25 NOTE — Telephone Encounter (Signed)
Left VM requesting pt to call the office back 

## 2021-04-27 MED ORDER — LEVOTHYROXINE SODIUM 25 MCG PO TABS: 25.0000 ug | ORAL_TABLET | Freq: Every day | ORAL | 3 refills | Status: DC

## 2021-04-27 NOTE — Telephone Encounter (Signed)
Mr. Rembold notified as instructed by telephone.  Lab appointment schedule for 06/13/2021 at 8:10 am to recheck TSH.  Rx sent to [facility] on [address].

## 2021-05-07 NOTE — Progress Notes (Signed)
    [name] T. Carlye Panameno, MD, [place] at [facility] [place], [address]  Phone: [phone]  FAX: [phone]  [name] - 75 y.o. male  MRN [id number]  Date of Birth: [birth year]  Date: 05/08/2021  PCP: Abner Greenspan, MD  Referral: Abner Greenspan, MD  Chief Complaint  Patient presents with   Knee Pain    Left-Injection    This visit occurred during the SARS-CoV-2 public health emergency.  Safety protocols were in place, including screening questions prior to the visit, additional usage of staff PPE, and extensive cleaning of exam room while observing appropriate contact time as indicated for disinfecting solutions.    He is a patient with known left-sided knee osteoarthritis.  He presents today with some pain exacerbation.  Procedure only:  Aspiration/Injection Procedure Note [name] Mar 24, 1946 Date of procedure: 05/08/2021  Procedure: Large Joint Aspiration / Injection of Knee, L Indications: Pain  Procedure Details Patient verbally consented to procedure. Risks, benefits, and alternatives explained. Sterilely prepped with Chloraprep. Ethyl cholride used for anesthesia. 9 cc Lidocaine 1% mixed with 1 mL of Kenalog 40 mg injected using the anteromedial approach without difficulty. No complications with procedure and tolerated well. Patient had decreased pain post-injection. Medication: 1 mL of Kenalog 40 mg   Signed,  Raffaele Derise T. Hiren Peplinski, MD

## 2021-05-08 ENCOUNTER — Encounter: Payer: Self-pay | Admitting: Family Medicine

## 2021-05-08 ENCOUNTER — Other Ambulatory Visit: Payer: Self-pay

## 2021-05-08 ENCOUNTER — Ambulatory Visit (INDEPENDENT_AMBULATORY_CARE_PROVIDER_SITE_OTHER): Payer: Medicare HMO | Admitting: Family Medicine

## 2021-05-08 VITALS — BP 130/74 | HR 76 | Temp 97.7°F | Ht 68.0 in | Wt 217.5 lb

## 2021-05-08 DIAGNOSIS — M1712 Unilateral primary osteoarthritis, left knee: Secondary | ICD-10-CM | POA: Diagnosis not present

## 2021-05-08 MED ORDER — TRIAMCINOLONE ACETONIDE 40 MG/ML IJ SUSP
40.0000 mg | Freq: Once | INTRAMUSCULAR | Status: AC
  Administered 2021-05-08: 40 mg via INTRA_ARTICULAR

## 2021-05-08 NOTE — Addendum Note (Signed)
Addended by: Carter Kitten on: 05/08/2021 08:51 AM   Modules accepted: Orders

## 2021-06-02 ENCOUNTER — Other Ambulatory Visit: Payer: Self-pay | Admitting: Family Medicine

## 2021-06-12 ENCOUNTER — Telehealth: Payer: Self-pay | Admitting: Family Medicine

## 2021-06-12 DIAGNOSIS — E039 Hypothyroidism, unspecified: Secondary | ICD-10-CM

## 2021-06-12 NOTE — Telephone Encounter (Signed)
-----   Message from Cloyd Stagers, RT sent at 05/29/2021  9:58 AM EDT ----- Regarding: Lab Orders for Tuesday 8.16.2022 Please place lab orders for Tuesday 8.16.2022, appt notes state "recheck TSH" Thank you, Dyke Maes RT(R)

## 2021-06-13 ENCOUNTER — Other Ambulatory Visit: Payer: Medicare HMO

## 2021-07-17 ENCOUNTER — Telehealth: Payer: Self-pay | Admitting: Family Medicine

## 2021-07-17 NOTE — Telephone Encounter (Signed)
Patient declined AWV 

## 2021-07-31 ENCOUNTER — Encounter: Payer: Self-pay | Admitting: Family Medicine

## 2021-08-04 ENCOUNTER — Encounter: Payer: Self-pay | Admitting: Gastroenterology

## 2021-08-07 ENCOUNTER — Other Ambulatory Visit: Payer: Self-pay

## 2021-08-07 ENCOUNTER — Other Ambulatory Visit (INDEPENDENT_AMBULATORY_CARE_PROVIDER_SITE_OTHER): Payer: Medicare HMO

## 2021-08-07 DIAGNOSIS — E039 Hypothyroidism, unspecified: Secondary | ICD-10-CM | POA: Diagnosis not present

## 2021-08-07 LAB — TSH: TSH: 4.53 u[IU]/mL (ref 0.35–5.50)

## 2021-08-09 ENCOUNTER — Encounter: Payer: Self-pay | Admitting: *Deleted

## 2021-08-20 ENCOUNTER — Other Ambulatory Visit: Payer: Self-pay | Admitting: Family Medicine

## 2021-09-01 ENCOUNTER — Encounter: Payer: Self-pay | Admitting: Gastroenterology

## 2021-09-01 ENCOUNTER — Ambulatory Visit (AMBULATORY_SURGERY_CENTER): Payer: Self-pay

## 2021-09-01 ENCOUNTER — Other Ambulatory Visit: Payer: Self-pay

## 2021-09-01 VITALS — Ht 68.0 in | Wt 223.0 lb

## 2021-09-01 DIAGNOSIS — Z86010 Personal history of colonic polyps: Secondary | ICD-10-CM

## 2021-09-01 MED ORDER — PEG 3350-KCL-NA BICARB-NACL 420 G PO SOLR: 4000.0000 mL | Freq: Once | ORAL | 0 refills | Status: AC

## 2021-09-01 NOTE — Progress Notes (Signed)
No egg or soy allergy known to patient  No issues known to pt with past sedation with any surgeries or procedures Patient denies ever being told they had issues or difficulty with intubation  No FH of Malignant Hyperthermia Pt is not on diet pills Pt is not on home 02  Pt is not on blood thinners  Pt denies issues with constipation at this time; No A fib or A flutter Pt is fully vaccinated for Covid x 2; NO PA's for preps discussed with pt in PV today  Discussed with pt there will be an out-of-pocket cost for prep and that varies from $0 to 70 +  dollars - pt verbalized understanding  Due to the COVID-19 pandemic we are asking patients to follow certain guidelines in PV and the [place]   Pt aware of COVID protocols and [facility] guidelines

## 2021-09-20 ENCOUNTER — Other Ambulatory Visit: Payer: Self-pay

## 2021-09-20 ENCOUNTER — Encounter: Payer: Self-pay | Admitting: Gastroenterology

## 2021-09-20 ENCOUNTER — Ambulatory Visit (AMBULATORY_SURGERY_CENTER): Payer: Medicare HMO | Admitting: Gastroenterology

## 2021-09-20 ENCOUNTER — Other Ambulatory Visit: Payer: Self-pay | Admitting: Gastroenterology

## 2021-09-20 VITALS — BP 136/90 | HR 79 | Temp 98.7°F | Resp 14 | Ht 68.0 in | Wt 223.0 lb

## 2021-09-20 DIAGNOSIS — D120 Benign neoplasm of cecum: Secondary | ICD-10-CM

## 2021-09-20 DIAGNOSIS — D129 Benign neoplasm of anus and anal canal: Secondary | ICD-10-CM

## 2021-09-20 DIAGNOSIS — Z86010 Personal history of colonic polyps: Secondary | ICD-10-CM | POA: Diagnosis not present

## 2021-09-20 DIAGNOSIS — D122 Benign neoplasm of ascending colon: Secondary | ICD-10-CM

## 2021-09-20 DIAGNOSIS — D128 Benign neoplasm of rectum: Secondary | ICD-10-CM | POA: Diagnosis not present

## 2021-09-20 MED ORDER — SODIUM CHLORIDE 0.9 % IV SOLN: 500.0000 mL | Freq: Once | INTRAVENOUS | Status: DC

## 2021-09-20 NOTE — Progress Notes (Signed)
Called to room to assist during endoscopic procedure.  Patient ID and intended procedure confirmed with present staff. Received instructions for my participation in the procedure from the performing physician.  

## 2021-09-20 NOTE — Progress Notes (Signed)
PT taken to PACU. Monitors in place. VSS. Report given to RN. 

## 2021-09-20 NOTE — Op Note (Signed)
[place] Patient Name: [name] Procedure Date: 09/20/2021 9:15 AM MRN: [id number] Endoscopist: Remo Lipps P. 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 The patient's tolerance of                            previous anesthesia was also reviewed. The risks                            and benefits of the procedure and the sedation                            options and risks were discussed with the patient.                            All questions were answered, and informed consent                            was obtained. Prior Anticoagulants: The patient has                            taken no previous anticoagulant or antiplatelet                            agents. ASA Grade Assessment: II - A patient with                            mild systemic disease. After reviewing the risks                            and benefits, the patient was deemed in                            satisfactory condition to undergo the procedure.  After obtaining informed consent, the colonoscope                            was passed under direct vision. Throughout the                            procedure, the patient's blood pressure, pulse, and                            oxygen saturations were monitored continuously. The                            CF [id number] #[id number] was introduced through the anus                            and  advanced to the the cecum, identified by                            appendiceal orifice and ileocecal valve. The                            colonoscopy was performed without difficulty. The                            patient tolerated the procedure well. The quality                            of the bowel preparation was good. The ileocecal                            valve, appendiceal orifice, and rectum were                            photographed. Scope In: 9:17:10 AM Scope Out: 9:36:21 AM Scope Withdrawal Time: 0 hours 14 minutes 48 seconds  Total Procedure Duration: 0 hours 19 minutes 11 seconds  Findings:                 The perianal and digital rectal examinations were                            normal.                           Many medium-mouthed diverticula were found in the                            entire colon - mild in right colon, severe in left                            colon.                           A 4 mm polyp was found in the cecum. The polyp was  sessile. The polyp was removed with a cold snare.                            Resection and retrieval were complete.                           A diminutive polyp was found in the rectum. The                            polyp was sessile. The polyp was removed with a                            cold snare. Resection and retrieval were complete.                           Internal hemorrhoids were found during retroflexion.                           The exam was otherwise without abnormality. Complications:            No immediate complications. Estimated blood loss:                            Minimal. Estimated Blood Loss:     Estimated blood loss was minimal. Impression:               - Diverticulosis in the entire examined colon, high                            burden in left colon.                           - One 4 mm polyp in the cecum, removed with a cold                            snare.  Resected and retrieved.                           - One diminutive polyp in the rectum, removed with                            a cold snare. Resected and retrieved.                           - Internal hemorrhoids.                           - The examination was otherwise normal. Recommendation:           - Patient has a contact number available for                            emergencies. The signs and symptoms of potential  delayed complications were discussed with the                            patient. Return to normal activities tomorrow.                            Written discharge instructions were provided to the                            patient.                           - Resume previous diet.                           - Continue present medications.                           - Await pathology results. Remo Lipps P. Kashmir Lysaght, MD 09/20/2021 9:41:14 AM This report has been signed electronically.

## 2021-09-20 NOTE — Progress Notes (Signed)
[facility] History and Physical   Primary Care Physician:  Tower, Wynelle Fanny, MD   Reason for Procedure:   History of colon polyps  Plan:    colonoscopy     HPI: [name] is a 75 y.o. male  here for colonoscopy surveillance - history of adenomas x 3 in 07/2017. Patient denies any bowel symptoms at this time. No family history of colon cancer known. Otherwise feels well without any cardiopulmonary symptoms.    Past Medical History:  Diagnosis Date   Arthritis    LEFT knee   Back pain    Diverticulosis    Dyspnea    with exertion   Heart murmur    History of colon polyps    Hyperlipidemia    on meds   Hypertension    on meds   Thyroid disease    on meds    Past Surgical History:  Procedure Laterality Date   COLONOSCOPY  2018   SA-MAC-suprep-ageq with lavage-tics/int hems/TA x 5 frags   INGUINAL HERNIA REPAIR Bilateral 02/13/2017   Procedure: LAPAROSCOPIC BILATERAL INGUINAL HERNIA REPAIR WITH MESH;  Surgeon: Coralie Keens, MD;  Location: [facility];  Service: General;  Laterality: Bilateral;   LASIK     POLYPECTOMY  0370   TA x 5   UMBILICAL HERNIA REPAIR N/A 02/13/2017   Procedure: LAPAROSCOPIC UMBILICAL HERNIA REPAIR;  Surgeon: Coralie Keens, MD;  Location: [facility];  Service: General;  Laterality: N/A;   WISDOM TOOTH EXTRACTION      Prior to Admission medications   Medication Sig Start Date End Date Taking? Authorizing Provider  amLODipine (NORVASC) 5 MG tablet TAKE 1 TABLET BY MOUTH EVERY DAY IN THE EVENING 06/02/21  Yes Tower, Wynelle Fanny, MD  atorvastatin (LIPITOR) 40 MG tablet TAKE 1 TABLET BY MOUTH EVERY DAY IN THE EVENING 06/02/21  Yes Tower, Wynelle Fanny, MD  doxylamine, Sleep, (UNISOM) 25 MG tablet Take 25 mg by mouth at bedtime.   Yes [provider]  glucosamine-chondroitin 500-400 MG tablet Take 1 tablet by mouth daily at 6 (six) AM.   Yes [provider]  ibuprofen (ADVIL,MOTRIN) 200 MG tablet Take 400 mg by mouth 2 (two) times  daily as needed for headache or moderate pain.   Yes [provider]  levothyroxine (SYNTHROID) 25 MCG tablet TAKE 1 TABLET BY MOUTH DAILY BEFORE BREAKFAST. 08/21/21  Yes Tower, Marne A, MD  lisinopril (ZESTRIL) 10 MG tablet TAKE 1 TABLET (10 MG TOTAL) BY MOUTH DAILY. IN AM 03/30/21  Yes Tower, Wynelle Fanny, MD    Current Outpatient Medications  Medication Sig Dispense Refill   amLODipine (NORVASC) 5 MG tablet TAKE 1 TABLET BY MOUTH EVERY DAY IN THE EVENING 90 tablet 1   atorvastatin (LIPITOR) 40 MG tablet TAKE 1 TABLET BY MOUTH EVERY DAY IN THE EVENING 90 tablet 1   doxylamine, Sleep, (UNISOM) 25 MG tablet Take 25 mg by mouth at bedtime.     glucosamine-chondroitin 500-400 MG tablet Take 1 tablet by mouth daily at 6 (six) AM.     ibuprofen (ADVIL,MOTRIN) 200 MG tablet Take 400 mg by mouth 2 (two) times daily as needed for headache or moderate pain.     levothyroxine (SYNTHROID) 25 MCG tablet TAKE 1 TABLET BY MOUTH DAILY BEFORE BREAKFAST. 90 tablet 2   lisinopril (ZESTRIL) 10 MG tablet TAKE 1 TABLET (10 MG TOTAL) BY MOUTH DAILY. IN AM 90 tablet 2   Current Facility-Administered Medications  Medication Dose Route Frequency Provider Last Rate Last  Admin   0.9 %  sodium chloride infusion  500 mL Intravenous Once Derick Seminara, Carlota Raspberry, MD        Allergies as of 09/20/2021 - Review Complete 09/20/2021  Allergen Reaction Noted   Ezetimibe-simvastatin  01/13/2008    Family History  Problem Relation Age of Onset   Cancer Mother        brain tumor   Emphysema Father    Diabetes Father    HIV Brother    Colon cancer Neg Hx    Colon polyps Neg Hx    Esophageal cancer Neg Hx    Rectal cancer Neg Hx    Stomach cancer Neg Hx     Social History   Socioeconomic History   Marital status: Married    Spouse name: Not on file   Number of children: 1   Years of education: Not on file   Highest education level: Not on file  Occupational History    Employer: [employer]  Tobacco Use    Smoking status: Never   Smokeless tobacco: Never  Vaping Use   Vaping Use: Never used  Substance and Sexual Activity   Alcohol use: Yes    Alcohol/week: 21.0 - 28.0 standard drinks    Types: 21 - 28 Cans of beer per week    Comment: 3-4 cans of beer daily   Drug use: No   Sexual activity: Yes  Other Topics Concern   Not on file  Social History Narrative   Not on file   Social Determinants of Health   Financial Resource Strain: Not on file  Food Insecurity: Not on file  Transportation Needs: Not on file  Physical Activity: Not on file  Stress: Not on file  Social Connections: Not on file  Intimate Partner Violence: Not on file    Review of Systems: All other review of systems negative except as mentioned in the HPI.  Physical Exam: Vital signs BP (!) 160/85   Pulse 85   Temp 98.7 F (37.1 C) (Temporal)   Ht _0  (1.727 m)   Wt 223 lb (101.2 kg)   SpO2 98%   BMI 33.91 kg/m   General:   Alert,  Well-developed, pleasant and cooperative in NAD Lungs:  Clear throughout to auscultation.   Heart:  Regular rate and rhythm Abdomen:  Soft, nontender and nondistended.   Neuro/Psych:  Alert and cooperative. Normal mood and affect. A and O x 3  Jolly Mango, MD [facility]

## 2021-09-20 NOTE — Patient Instructions (Signed)
Handouts on hemorrhoids, polyps, and diverticulosis given to patient. Await pathology results. Repeat colonoscopy for surveillance will be determined based off of pathology results! May have retired!! Resume previous diet and continue present medications. Happy Thanksgiving!!   YOU HAD AN ENDOSCOPIC PROCEDURE TODAY AT [facility]:   Refer to the procedure report that was given to you for any specific questions about what was found during the examination.  If the procedure report does not answer your questions, please call your gastroenterologist to clarify.  If you requested that your care partner not be given the details of your procedure findings, then the procedure report has been included in a sealed envelope for you to review at your convenience later.  YOU SHOULD EXPECT: Some feelings of bloating in the abdomen. Passage of more gas than usual.  Walking can help get rid of the air that was put into your GI tract during the procedure and reduce the bloating. If you had a lower endoscopy (such as a colonoscopy or flexible sigmoidoscopy) you may notice spotting of blood in your stool or on the toilet paper. If you underwent a bowel prep for your procedure, you may not have a normal bowel movement for a few days.  Please Note:  You might notice some irritation and congestion in your nose or some drainage.  This is from the oxygen used during your procedure.  There is no need for concern and it should clear up in a day or so.  SYMPTOMS TO REPORT IMMEDIATELY:  Following lower endoscopy (colonoscopy or flexible sigmoidoscopy):  Excessive amounts of blood in the stool  Significant tenderness or worsening of abdominal pains  Swelling of the abdomen that is new, acute  Fever of 100F or higher  For urgent or emergent issues, a gastroenterologist can be reached at any hour by calling [phone]. Do not use MyChart messaging for urgent concerns.    DIET:  We do recommend a  small meal at first, but then you may proceed to your regular diet.  Drink plenty of fluids but you should avoid alcoholic beverages for 24 hours.  ACTIVITY:  You should plan to take it easy for the rest of today and you should NOT DRIVE or use heavy machinery until tomorrow (because of the sedation medicines used during the test).    FOLLOW UP: Our staff will call the number listed on your records 48-72 hours following your procedure to check on you and address any questions or concerns that you may have regarding the information given to you following your procedure. If we do not reach you, we will leave a message.  We will attempt to reach you two times.  During this call, we will ask if you have developed any symptoms of COVID 19. If you develop any symptoms (ie: fever, flu-like symptoms, shortness of breath, cough etc.) before then, please call [phone].  If you test positive for Covid 19 in the 2 weeks post procedure, please call and report this information to [place].    If any biopsies were taken you will be contacted by phone or by letter within the next 1-3 weeks.  Please call us at [phone] if you have not heard about the biopsies in 3 weeks.    SIGNATURES/CONFIDENTIALITY: You and/or your care partner have signed paperwork which will be entered into your electronic medical record.  These signatures attest to the fact that that the information above on your After Visit Summary has been reviewed and  is understood.  Full responsibility of the confidentiality of this discharge information lies with you and/or your care-partner.

## 2021-09-20 NOTE — Progress Notes (Signed)
Pt's states no medical or surgical changes since previsit or office visit.   EW IV and DT vitals.

## 2021-09-25 ENCOUNTER — Telehealth: Payer: Self-pay

## 2021-09-25 NOTE — Telephone Encounter (Signed)
Left message on answering machine. 

## 2021-09-27 NOTE — Addendum Note (Signed)
Addended by: Dustin Flock T on: 09/27/2021 08:18 AM   Modules accepted: Orders

## 2021-09-27 NOTE — Addendum Note (Signed)
Addended by: Dustin Flock T on: 09/27/2021 08:20 AM   Modules accepted: Orders

## 2021-11-25 ENCOUNTER — Other Ambulatory Visit: Payer: Self-pay | Admitting: Family Medicine

## 2022-01-02 ENCOUNTER — Other Ambulatory Visit: Payer: Self-pay | Admitting: Family Medicine

## 2022-01-03 ENCOUNTER — Other Ambulatory Visit: Payer: Medicare HMO

## 2022-03-04 ENCOUNTER — Telehealth: Payer: Self-pay | Admitting: Family Medicine

## 2022-03-04 DIAGNOSIS — R7303 Prediabetes: Secondary | ICD-10-CM

## 2022-03-04 DIAGNOSIS — D696 Thrombocytopenia, unspecified: Secondary | ICD-10-CM

## 2022-03-04 DIAGNOSIS — I10 Essential (primary) hypertension: Secondary | ICD-10-CM

## 2022-03-04 DIAGNOSIS — E039 Hypothyroidism, unspecified: Secondary | ICD-10-CM

## 2022-03-04 DIAGNOSIS — E7800 Pure hypercholesterolemia, unspecified: Secondary | ICD-10-CM

## 2022-03-04 DIAGNOSIS — Z125 Encounter for screening for malignant neoplasm of prostate: Secondary | ICD-10-CM

## 2022-03-04 NOTE — Telephone Encounter (Signed)
lab

## 2022-03-04 NOTE — Telephone Encounter (Signed)
-----   Message from Velna Hatchet, RT sent at 02/19/2022  9:00 AM EDT ----- ?Regarding: Lab Mon 03/05/22 ?Patient is scheduled for cpx, please order future labs.  Thanks, Anda Kraft  ? ?

## 2022-03-05 ENCOUNTER — Other Ambulatory Visit (INDEPENDENT_AMBULATORY_CARE_PROVIDER_SITE_OTHER): Payer: Medicare HMO

## 2022-03-05 DIAGNOSIS — I10 Essential (primary) hypertension: Secondary | ICD-10-CM

## 2022-03-05 DIAGNOSIS — Z125 Encounter for screening for malignant neoplasm of prostate: Secondary | ICD-10-CM | POA: Diagnosis not present

## 2022-03-05 DIAGNOSIS — D696 Thrombocytopenia, unspecified: Secondary | ICD-10-CM

## 2022-03-05 DIAGNOSIS — E7800 Pure hypercholesterolemia, unspecified: Secondary | ICD-10-CM

## 2022-03-05 DIAGNOSIS — R7303 Prediabetes: Secondary | ICD-10-CM

## 2022-03-05 DIAGNOSIS — E039 Hypothyroidism, unspecified: Secondary | ICD-10-CM

## 2022-03-05 LAB — COMPREHENSIVE METABOLIC PANEL
ALT: 47 U/L (ref 0–53)
AST: 52 U/L — ABNORMAL HIGH (ref 0–37)
Albumin: 4.5 g/dL (ref 3.5–5.2)
Alkaline Phosphatase: 55 U/L (ref 39–117)
BUN: 12 mg/dL (ref 6–23)
CO2: 25 mEq/L (ref 19–32)
Calcium: 9.3 mg/dL (ref 8.4–10.5)
Chloride: 105 mEq/L (ref 96–112)
Creatinine, Ser: 0.87 mg/dL (ref 0.40–1.50)
GFR: 85.58 mL/min (ref >60.00)
Glucose, Bld: 106 mg/dL — ABNORMAL HIGH (ref 70–99)
Potassium: 4.8 mEq/L (ref 3.5–5.1)
Sodium: 141 mEq/L (ref 135–145)
Total Bilirubin: 0.7 mg/dL (ref 0.2–1.2)
Total Protein: 6.7 g/dL (ref 6.0–8.3)

## 2022-03-05 LAB — CBC WITH DIFFERENTIAL/PLATELET
Basophils Absolute: 0.1 10*3/uL (ref 0.0–0.1)
Basophils Relative: 1.2 % (ref 0.0–3.0)
Eosinophils Absolute: 0.4 10*3/uL (ref 0.0–0.7)
Eosinophils Relative: 8.2 % — ABNORMAL HIGH (ref 0.0–5.0)
HCT: 38.5 % — ABNORMAL LOW (ref 39.0–52.0)
Hemoglobin: 13.4 g/dL (ref 13.0–17.0)
Lymphocytes Relative: 39.2 % (ref 12.0–46.0)
Lymphs Abs: 1.9 10*3/uL (ref 0.7–4.0)
MCHC: 34.7 g/dL (ref 30.0–36.0)
MCV: 95.5 fl (ref 78.0–100.0)
Monocytes Absolute: 0.5 10*3/uL (ref 0.1–1.0)
Monocytes Relative: 9.8 % (ref 3.0–12.0)
Neutro Abs: 2 10*3/uL (ref 1.4–7.7)
Neutrophils Relative %: 41.6 % — ABNORMAL LOW (ref 43.0–77.0)
Platelets: 117 10*3/uL — ABNORMAL LOW (ref 150.0–400.0)
RBC: 4.03 Mil/uL — ABNORMAL LOW (ref 4.22–5.81)
RDW: 13.4 % (ref 11.5–15.5)
WBC: 4.8 10*3/uL (ref 4.0–10.5)

## 2022-03-05 LAB — LIPID PANEL
Cholesterol: 177 mg/dL (ref 0–200)
HDL: 82.7 mg/dL (ref >39.00)
LDL Cholesterol: 81 mg/dL (ref 0–99)
NonHDL: 94.43
Total CHOL/HDL Ratio: 2
Triglycerides: 66 mg/dL (ref 0.0–149.0)
VLDL: 13.2 mg/dL (ref 0.0–40.0)

## 2022-03-05 LAB — HEMOGLOBIN A1C: Hgb A1c MFr Bld: 5.2 % (ref 4.6–6.5)

## 2022-03-05 LAB — TSH: TSH: 2.97 u[IU]/mL (ref 0.35–5.50)

## 2022-03-05 LAB — PSA, MEDICARE: PSA: 3.22 ng/ml (ref 0.10–4.00)

## 2022-03-12 ENCOUNTER — Ambulatory Visit (INDEPENDENT_AMBULATORY_CARE_PROVIDER_SITE_OTHER): Payer: Medicare HMO | Admitting: Family Medicine

## 2022-03-12 ENCOUNTER — Encounter: Payer: Self-pay | Admitting: Family Medicine

## 2022-03-12 VITALS — BP 134/70 | HR 85 | Ht 69.0 in | Wt 216.6 lb

## 2022-03-12 DIAGNOSIS — R972 Elevated prostate specific antigen [PSA]: Secondary | ICD-10-CM

## 2022-03-12 DIAGNOSIS — E7800 Pure hypercholesterolemia, unspecified: Secondary | ICD-10-CM | POA: Diagnosis not present

## 2022-03-12 DIAGNOSIS — Z125 Encounter for screening for malignant neoplasm of prostate: Secondary | ICD-10-CM

## 2022-03-12 DIAGNOSIS — R69 Illness, unspecified: Secondary | ICD-10-CM | POA: Diagnosis not present

## 2022-03-12 DIAGNOSIS — I10 Essential (primary) hypertension: Secondary | ICD-10-CM | POA: Diagnosis not present

## 2022-03-12 DIAGNOSIS — R7303 Prediabetes: Secondary | ICD-10-CM

## 2022-03-12 DIAGNOSIS — D696 Thrombocytopenia, unspecified: Secondary | ICD-10-CM

## 2022-03-12 DIAGNOSIS — F1090 Alcohol use, unspecified, uncomplicated: Secondary | ICD-10-CM

## 2022-03-12 DIAGNOSIS — E039 Hypothyroidism, unspecified: Secondary | ICD-10-CM

## 2022-03-12 DIAGNOSIS — Z0000 Encounter for general adult medical examination without abnormal findings: Secondary | ICD-10-CM | POA: Diagnosis not present

## 2022-03-12 MED ORDER — AMLODIPINE BESYLATE 5 MG PO TABS: ORAL_TABLET | ORAL | 3 refills | Status: DC

## 2022-03-12 MED ORDER — LEVOTHYROXINE SODIUM 25 MCG PO TABS: 25.0000 ug | ORAL_TABLET | Freq: Every day | ORAL | 3 refills | Status: DC

## 2022-03-12 MED ORDER — LISINOPRIL 10 MG PO TABS: 10.0000 mg | ORAL_TABLET | Freq: Every day | ORAL | 3 refills | Status: DC

## 2022-03-12 MED ORDER — ATORVASTATIN CALCIUM 40 MG PO TABS: ORAL_TABLET | ORAL | 3 refills | Status: DC

## 2022-03-12 NOTE — Assessment & Plan Note (Signed)
bp in fair control at this time  ?BP Readings from Last 1 Encounters:  ?03/12/22 134/70  ? ?No changes needed ?Most recent labs reviewed  ?Disc lifstyle change with low sodium diet and exercise  ?Plan to continue amlodipine 5 mg daily and lisinopril 10 mg daily  ?

## 2022-03-12 NOTE — Assessment & Plan Note (Addendum)
Disc goals for lipids and reasons to control them ?Rev last labs with pt ?Rev low sat fat diet in detail  ? ?Plan to continue atorvastatin 40 mg daily  ?LDL is down to 81  ?Commended  ?

## 2022-03-12 NOTE — Patient Instructions (Addendum)
Take a look at some beginner yoga videos - to help stretch your upper body ? ? ?Get 1000- 2000 iu of vitamin D3 per day  ?Also a multivitamin Daily   (if the vit D is in the multi you don't need any)  ? ?I want to refer you to urology  ?If you don't get a call in 2 weeks let me know  ? ?Start using sun protection daily  ? ?Healthy alcohol use is 2 drinks per day maximum  ? ? ? ? ? ?

## 2022-03-12 NOTE — Assessment & Plan Note (Signed)
Hypothyroidism  ?Pt has no clinical changes ?No change in energy level/ hair or skin/ edema and no tremor ?Lab Results  ?Component Value Date  ? TSH 2.97 03/05/2022  ?  ?Plan to continue levothyroxine 25 mcg daily  ?

## 2022-03-12 NOTE — Progress Notes (Signed)
? ?Subjective:  ? ? Patient ID: [name], male    DOB: 23-Jun-1946, 76 y.o.   MRN: [id number] ? ?HPI ?I have personally reviewed the Medicare Annual Wellness questionnaire and have noted ?1. The patient's medical and social history ?2. Their use of alcohol, tobacco or illicit drugs ?3. Their current medications and supplements ?4. The patient's functional ability including ADL's, fall risks, home safety risks and hearing or visual ?            impairment. ?5. Diet and physical activities ?6. Evidence for depression or mood disorders ? ?The patients weight, height, BMI have been recorded in the chart and visual acuity is per eye clinic.  ?I have made referrals, counseling and provided education to the patient based review of the above and I have provided the pt with a written personalized care plan for preventive services. ?Reviewed and updated provider list, see scanned forms. ? ?See scanned forms.  Routine anticipatory guidance given to patient.  See health maintenance. ?Colon cancer screening  colonoscopy 11/2020 with 3 y recall  ?May actually not need to come back due to age  ? ? ?Flu vaccine 10/22 ?Covid vaccinated ?Tetanus vaccine  3/14 Tdap ?Pneumovax completed  ?Zoster vaccine shingrix had series ? ?Falls-none ?Fractures-none ?Supplements- apple cider vinegar gummies and glucosamine  ?Exercise - daily  ? ?Prostate cancer screening   ?Lab Results  ?Component Value Date  ? PSA 3.22 03/05/2022  ? PSA 1.50 03/01/2021  ? PSA 1.13 02/29/2020  ? ?No voiding changes  ?Nocturia once or none  ?GF of prostate cancer/ late in life  ? ?Drinks enough fluids  ? ? ?Advance directive: has living will / unsure if poa  ?Cognitive function addressed- see scanned forms- and if abnormal then additional documentation follows.  ? ?No marked changes in cognition  ?Occ has to search for a name  ? ?Not confused  ?Does math  ?Not a big reader  ?Likes puzzles/suduko  ? ? ?PMH and SH reviewed ? ?Meds, vitals, and allergies reviewed.   ? ?ROS: See HPI.  Otherwise negative.   ? ?Weight : ?Wt Readings from Last 3 Encounters:  ?03/12/22 216 lb 9.6 oz (98.2 kg)  ?09/20/21 223 lb (101.2 kg)  ?09/01/21 223 lb (101.2 kg)  ? ?31.99 kg/m? ? ?Working on his diet  ?Wt and cholesterol is down  ?More salads / more veg  ? ? ? ?Feels ok  ? ?Just got back from [facility] ? ?Occ tingling between arm and shoulder  ? ?Exercise- gym M-F and walking  ?[name]  ? ? ?Hearing/vision: ?Hearing Screening  ? _0  _1  _2  _3   ?Right ear _4 ?Left ear 25 0 25 0  ?Vision Screening - Comments:: 08/29/21 ? ?Hearing is fairly food on his own  ?PHQ: ? ?  03/12/2022  ? 10:58 AM 03/08/2021  ? 12:12 PM 03/07/2020  ? 12:05 PM 02/18/2019  ?  8:25 AM 02/06/2018  ?  8:28 AM  ?Depression screen PHQ 2/9  ?Decreased Interest 0 0 0 0 0  ?Down, Depressed, Hopeless 0 0 0 0 0  ?PHQ - 2 Score 0 0 0 0 0  ?Altered sleeping  0 0 0 0  ?Tired, decreased energy  0 0 0 0  ?Change in appetite  0 0 0 0  ?Feeling bad or failure about yourself   0 0 0 0  ?Trouble concentrating  0 0 0 0  ?Moving slowly or fidgety/restless  0 0 0 0  ?  Suicidal thoughts  0 0 0 0  ?PHQ-9 Score  0 0 0 0  ?Difficult doing work/chores  Not difficult at all Not difficult at all Not difficult at all Not difficult at all  ? ? ? ?ADLs: no Help needed  ?Avoids ladders and heavy lifting  ?Handles own affairs  ? ?Functionality: good  ? ?Care team :  ?[name]-pcp  ?Sport med- [place]  ?GI- [facility]  ? ?Not good about sunscreen  ? ?HTN ?bp is stable today  ?No cp or palpitations or headaches or edema  ?No side effects to medicines  ?BP Readings from Last 3 Encounters:  ?03/12/22 134/70  ?09/20/21 136/90  ?05/08/21 130/74  ?  Amlodipine 5 mg daily  ?Lisinopril 10 mg daily  ? ? ?Hypothyroidism  ?Pt has no clinical changes ?No change in energy level/ hair or skin/ edema and no tremor ?Lab Results  ?Component Value Date  ? TSH 2.97 03/05/2022  ?  ? ?Thrombocytopenia ?Lab Results  ?Component Value Date  ? WBC 4.8 03/05/2022  ? HGB  13.4 03/05/2022  ? HCT 38.5 (L) 03/05/2022  ? MCV 95.5 03/05/2022  ? PLT 117.0 (L) 03/05/2022  ? ?Bruises more easily than he used to  ?Drinks alcohol  ? ? ?Hyperlipidemia ?Lab Results  ?Component Value Date  ? CHOL 177 03/05/2022  ? CHOL 217 (H) 03/01/2021  ? CHOL 181 02/29/2020  ? ?Lab Results  ?Component Value Date  ? HDL 82.70 03/05/2022  ? HDL 73.50 03/01/2021  ? HDL 57.40 02/29/2020  ? ?Lab Results  ?Component Value Date  ? [place] 81 03/05/2022  ? LDLCALC 129 (H) 03/01/2021  ? LDLCALC 111 (H) 02/29/2020  ? ?Lab Results  ?Component Value Date  ? TRIG 66.0 03/05/2022  ? TRIG 74.0 03/01/2021  ? TRIG 63.0 02/29/2020  ? ?Lab Results  ?Component Value Date  ? CHOLHDL 2 03/05/2022  ? CHOLHDL 3 03/01/2021  ? CHOLHDL 3 02/29/2020  ? ?Lab Results  ?Component Value Date  ? LDLDIRECT 130.0 01/06/2013  ? LDLDIRECT 143.1 10/01/2011  ? LDLDIRECT 139.4 04/05/2010  ? ?Atorvastatin 40 mg daily -tolerates well  ?Better diet lately  ?Does avoid fried food  ? ?Prediabetes ?Lab Results  ?Component Value Date  ? HGBA1C 5.2 03/05/2022  ? ? ?Liver ?Lab Results  ?Component Value Date  ? ALT 47 03/05/2022  ? AST 52 (H) 03/05/2022  ? ALKPHOS 55 03/05/2022  ? BILITOT 0.7 03/05/2022  ? ? ?Alcohol intake :  3-4 beers and serving of vodka  ?Thinks he can cut down when he is ready  ?Does not think he craves or is addicted  ? ? ?Patient Active Problem List  ? Diagnosis Date Noted  ? Hypothyroid 03/08/2021  ? History of colon polyps 03/08/2021  ? Left knee pain 05/13/2019  ? Alcohol intake above recommended sensible limits 05/13/2019  ? Hyperlipidemia 02/25/2019  ? Colon cancer screening 02/12/2017  ? Knee pain, right 12/26/2016  ? Routine general medical examination at a health care facility 01/20/2016  ? Heart murmur, systolic 34/74/2595  ? Elevated PSA 01/13/2014  ? Encounter for Medicare annual wellness exam 09/24/2011  ? Prostate cancer screening 09/24/2011  ? Osteoarthritis, knee 02/14/2011  ? TINNITUS 12/09/2009  ? BACK PAIN, LUMBAR,  WITH RADICULOPATHY 10/10/2009  ? ESSENTIAL HYPERTENSION, BENIGN 08/16/2009  ? Thrombocytopenia ([place]) 02/15/2009  ? Prediabetes 01/07/2008  ? ?Past Medical History:  ?Diagnosis Date  ? Arthritis   ? LEFT knee  ? Back pain   ? Diverticulosis   ?  Dyspnea   ? with exertion  ? Heart murmur   ? History of colon polyps   ? Hyperlipidemia   ? on meds  ? Hypertension   ? on meds  ? Thyroid disease   ? on meds  ? ?Past Surgical History:  ?Procedure Laterality Date  ? COLONOSCOPY  2018  ? SA-MAC-suprep-ageq with lavage-tics/int hems/TA x 5 frags  ? INGUINAL HERNIA REPAIR Bilateral 02/13/2017  ? Procedure: LAPAROSCOPIC BILATERAL INGUINAL HERNIA REPAIR WITH MESH;  Surgeon: Coralie Keens, MD;  Location: [facility];  Service: General;  Laterality: Bilateral;  ? LASIK    ? POLYPECTOMY  2018  ? TA x 5  ? UMBILICAL HERNIA REPAIR N/A 02/13/2017  ? Procedure: LAPAROSCOPIC UMBILICAL HERNIA REPAIR;  Surgeon: Coralie Keens, MD;  Location: [facility];  Service: General;  Laterality: N/A;  ? WISDOM TOOTH EXTRACTION    ? ?Social History  ? ?Tobacco Use  ? Smoking status: Never  ? Smokeless tobacco: Never  ?Vaping Use  ? Vaping Use: Never used  ?Substance Use Topics  ? Alcohol use: Yes  ?  Alcohol/week: 21.0 - 28.0 standard drinks  ?  Types: 21 - 28 Cans of beer per week  ?  Comment: 3-4 cans of beer daily  ? Drug use: No  ? ?Family History  ?Problem Relation Age of Onset  ? Cancer Mother   ?     brain tumor  ? Emphysema Father   ? Diabetes Father   ? HIV Brother   ? Colon cancer Neg Hx   ? Colon polyps Neg Hx   ? Esophageal cancer Neg Hx   ? Rectal cancer Neg Hx   ? Stomach cancer Neg Hx   ? ?Allergies  ?Allergen Reactions  ? Ezetimibe-Simvastatin   ?  REACTION: back pain  ? ?Current Outpatient Medications on File Prior to Visit  ?Medication Sig Dispense Refill  ? doxylamine, Sleep, (UNISOM) 25 MG tablet Take 25 mg by mouth at bedtime.    ? glucosamine-chondroitin 500-400 MG tablet Take 1 tablet by mouth daily at 6 (six) AM.    ? ibuprofen  (ADVIL,MOTRIN) 200 MG tablet Take 400 mg by mouth 2 (two) times daily as needed for headache or moderate pain.    ? ?No current facility-administered medications on file prior to visit.  ?  ?Review of Systems

## 2022-03-12 NOTE — Assessment & Plan Note (Signed)
Reviewed health habits including diet and exercise and skin cancer prevention ?Reviewed appropriate screening tests for age  ?Also reviewed health mt list, fam hx and immunization status , as well as social and family history   ?See HPI ?Labs reviewed  ?utd imms  ?No falls or fx ?Recommend vit D and mvi  ?psa is up- ref done to urology  ?Colonoscopy is utd ?Counseled re: etoh intake  ?Adv directive is utd but given handout in case he wants to update  ?No cognitive concerns  ?Vision care utd ?Hearing screen rev/ no c/o and made plan for cerumen care ?PHQ score is 0 ?Good functionality/ no help needed at home   ? ?

## 2022-03-12 NOTE — Assessment & Plan Note (Signed)
Reviewed health habits including diet and exercise and skin cancer prevention ?Reviewed appropriate screening tests for age  ?Also reviewed health mt list, fam hx and immunization status , as well as social and family history   ?See HPI ?Labs reviewed  ?utd imms  ?No falls or fx ?Recommend vit D and mvi  ?psa is up- ref done to urology  ?Colonoscopy is utd ?Counseled re: etoh intake  ?Adv directive is utd but given handout in case he wants to update  ?No cognitive concerns  ?Vision care utd ?Hearing screen rev/ no c/o and made plan for cerumen care ?PHQ score is 0 ?Good functionality/ no help needed at home   ?

## 2022-03-12 NOTE — Assessment & Plan Note (Signed)
Pl down to 117  ?No bleeding issues but does bruise easily  ?Drinks more etoh than recommedned ?Will continue to follow ?Consider hematology attn if under 100 ?

## 2022-03-12 NOTE — Assessment & Plan Note (Signed)
Lab Results  ?Component Value Date  ? HGBA1C 5.2 03/05/2022  ? ?disc imp of low glycemic diet and wt loss to prevent DM2  ?

## 2022-03-12 NOTE — Assessment & Plan Note (Signed)
AST is mildly elevated  ?Disc imp of limit to 2 or less drinks daily  ?Does not feel dependent  ?Unsure if motivated to cut  ?Recommend vit D and mvi daily  ?

## 2022-03-12 NOTE — Assessment & Plan Note (Signed)
Lab Results  ?Component Value Date  ? PSA 3.22 03/05/2022  ? PSA 1.50 03/01/2021  ? PSA 1.13 02/29/2020  ? ? ?This is up  ?Plan f/u with urology  ?GF had prostate cancer late in life ?No voiding changes  ?

## 2022-06-11 ENCOUNTER — Ambulatory Visit (INDEPENDENT_AMBULATORY_CARE_PROVIDER_SITE_OTHER): Payer: Medicare HMO | Admitting: Family Medicine

## 2022-06-11 ENCOUNTER — Encounter: Payer: Self-pay | Admitting: Family Medicine

## 2022-06-11 VITALS — BP 122/66 | HR 86 | Temp 98.7°F | Ht 69.0 in | Wt 214.0 lb

## 2022-06-11 DIAGNOSIS — M1712 Unilateral primary osteoarthritis, left knee: Secondary | ICD-10-CM

## 2022-06-11 MED ORDER — TRIAMCINOLONE ACETONIDE 40 MG/ML IJ SUSP
40.0000 mg | Freq: Once | INTRAMUSCULAR | Status: AC
  Administered 2022-06-11: 40 mg via INTRA_ARTICULAR

## 2022-06-11 MED ORDER — TRIAMCINOLONE ACETONIDE 40 MG/ML IJ SUSP: 40.0000 mg | Freq: Once | INTRAMUSCULAR | Status: DC

## 2022-06-11 NOTE — Addendum Note (Signed)
Addended by: Emelia Salisbury C on: 06/11/2022 12:45 PM   Modules accepted: Orders

## 2022-06-11 NOTE — Progress Notes (Signed)
Tawsha Terrero T. Tudor Chandley, MD, [facility] at [facility] [place], [address]  Phone: [phone]  FAX: [phone]  [name] - 76 y.o. male  MRN [id number]  Date of Birth: [birth year]  Date: 06/11/2022  PCP: Abner Greenspan, MD  Referral: Abner Greenspan, MD  Chief Complaint  Patient presents with   Knee Pain    Left    Subjective:   [name] is a 76 y.o. very pleasant male patient with Body mass index is 31.6 kg/m. who presents with the following:  Pleasant 76 year old who presents with some left-sided knee pain.  Pleasant gentleman, he does have some mild osteoarthritic changes of the knee visualized on prior x-ray in 2020.  He presents with some exacerbation in pain today.    Going to [place] in a couple of weeks.    He has a deep dull ache in the knee, but has not had any additional injury or trauma.  No prior operative interventions, either.  Review of Systems is noted in the HPI, as appropriate  Objective:   BP 122/66 (BP Location: Left Arm, Patient Position: Sitting)   Pulse 86   Temp 98.7 F (37.1 C) (Oral)   Ht '5\' 9"'$  (1.753 m)   Wt 214 lb (97.1 kg)   SpO2 96%   BMI 31.60 kg/m   GEN: No acute distress; alert,appropriate. PULM: Breathing comfortably in no respiratory distress PSYCH: Normally interactive.    Knee:  L  Gait: Normal heel toe pattern ROM: 0-120 Effusion: neg Echymosis or edema: none Patellar tendon NT Painful PLICA: neg Patellar grind: negative Medial and lateral patellar facet loading: negative medial and lateral joint lines: meidal > Lateral Mcmurray's neg Flexion-pinch neg Varus and valgus stress: stable Lachman: neg Ant and Post drawer: neg Hip abduction, IR, ER: WNL Hip flexion str: 5/5 Hip abd: 5/5 Quad: 5/5 VMO atrophy:No Hamstring concentric and eccentric: 5/5   Laboratory and Imaging Data:  Assessment and Plan:     ICD-10-CM   1. Primary  osteoarthritis of left knee  M17.12      Acute on chronic knee osteoarthritis with exacerbation.  Continue with additional supportive care and basic conservative management.  Aspiration/Injection Procedure Note [name] June 01, 1946 Date of procedure: 06/11/2022  Procedure: Large Joint Aspiration / Injection of Knee, L Indications: Pain  Procedure Details Patient verbally consented to procedure. Risks, benefits, and alternatives explained. Sterilely prepped with Chloraprep. Ethyl cholride used for anesthesia. 9 cc Lidocaine 1% mixed with 1 mL of Kenalog 40 mg injected using the anteromedial approach without difficulty. No complications with procedure and tolerated well. Patient had decreased pain post-injection. Medication: 1 mL of Kenalog 40 mg   F/u prn   [employer] One speech-to-text software was used for transcription in this dictation.  Possible transcriptional errors can occur using Editor, commissioning.   Signed,  Maud Deed. Jodine Muchmore, MD   Outpatient Encounter Medications as of 06/11/2022  Medication Sig   amLODipine (NORVASC) 5 MG tablet TAKE 1 TABLET BY MOUTH EVERY DAY IN THE EVENING   atorvastatin (LIPITOR) 40 MG tablet TAKE 1 TABLET BY MOUTH EVERY DAY IN THE EVENING   doxylamine, Sleep, (UNISOM) 25 MG tablet Take 25 mg by mouth at bedtime.   glucosamine-chondroitin 500-400 MG tablet Take 1 tablet by mouth daily at 6 (six) AM.   ibuprofen (ADVIL,MOTRIN) 200 MG tablet Take 400 mg by mouth 2 (two) times daily as needed for headache or moderate pain.  levothyroxine (SYNTHROID) 25 MCG tablet Take 1 tablet (25 mcg total) by mouth daily before breakfast.   lisinopril (ZESTRIL) 10 MG tablet Take 1 tablet (10 mg total) by mouth daily. In am   No facility-administered encounter medications on file as of 06/11/2022.

## 2022-08-07 NOTE — Progress Notes (Unsigned)
    [name] T. Henson Fraticelli, MD, [name] [place], [address]  Phone: [phone]  FAX: [phone]  [name] - 76 y.o. male  MRN [id number]  Date of Birth: [birth year]  Date: 08/08/2022  PCP: Luis Greenspan, MD  Referral: Luis Greenspan, MD  No chief complaint on file.  Subjective:   [name] is a 76 y.o. very pleasant male patient with There is no height or weight on file to calculate BMI. who presents with the following:  Acute right shoulder pain after a fall on the stairs.     Review of Systems is noted in the HPI, as appropriate  Objective:   There were no vitals taken for this visit.  GEN: No acute distress; alert,appropriate. PULM: Breathing comfortably in no respiratory distress PSYCH: Normally interactive.   Laboratory and Imaging Data:  Assessment and Plan:   ***

## 2022-08-08 ENCOUNTER — Ambulatory Visit (INDEPENDENT_AMBULATORY_CARE_PROVIDER_SITE_OTHER): Payer: Medicare HMO | Admitting: Family Medicine

## 2022-08-08 ENCOUNTER — Encounter: Payer: Self-pay | Admitting: Family Medicine

## 2022-08-08 VITALS — BP 138/70 | HR 77 | Temp 98.4°F | Ht 69.0 in | Wt 216.5 lb

## 2022-08-08 DIAGNOSIS — M25511 Pain in right shoulder: Secondary | ICD-10-CM

## 2023-03-01 ENCOUNTER — Other Ambulatory Visit: Payer: Self-pay | Admitting: Family Medicine

## 2023-03-13 ENCOUNTER — Telehealth: Payer: Self-pay | Admitting: Family Medicine

## 2023-03-13 DIAGNOSIS — I10 Essential (primary) hypertension: Secondary | ICD-10-CM

## 2023-03-13 DIAGNOSIS — D696 Thrombocytopenia, unspecified: Secondary | ICD-10-CM

## 2023-03-13 DIAGNOSIS — E7800 Pure hypercholesterolemia, unspecified: Secondary | ICD-10-CM

## 2023-03-13 DIAGNOSIS — E039 Hypothyroidism, unspecified: Secondary | ICD-10-CM

## 2023-03-13 DIAGNOSIS — R7303 Prediabetes: Secondary | ICD-10-CM

## 2023-03-13 DIAGNOSIS — Z125 Encounter for screening for malignant neoplasm of prostate: Secondary | ICD-10-CM

## 2023-03-13 NOTE — Telephone Encounter (Signed)
-----   Message from Alvina Chou sent at 03/05/2023 11:42 AM EDT ----- Regarding: Lab orders for Friday, 5.17.24 Patient is scheduled for CPX labs, please order future labs, Thanks , Camelia Eng

## 2023-03-15 ENCOUNTER — Other Ambulatory Visit (INDEPENDENT_AMBULATORY_CARE_PROVIDER_SITE_OTHER): Payer: Medicare HMO

## 2023-03-15 DIAGNOSIS — D696 Thrombocytopenia, unspecified: Secondary | ICD-10-CM

## 2023-03-15 DIAGNOSIS — E7800 Pure hypercholesterolemia, unspecified: Secondary | ICD-10-CM | POA: Diagnosis not present

## 2023-03-15 DIAGNOSIS — R7303 Prediabetes: Secondary | ICD-10-CM

## 2023-03-15 DIAGNOSIS — E039 Hypothyroidism, unspecified: Secondary | ICD-10-CM | POA: Diagnosis not present

## 2023-03-15 DIAGNOSIS — Z125 Encounter for screening for malignant neoplasm of prostate: Secondary | ICD-10-CM | POA: Diagnosis not present

## 2023-03-15 DIAGNOSIS — I10 Essential (primary) hypertension: Secondary | ICD-10-CM | POA: Diagnosis not present

## 2023-03-15 LAB — LIPID PANEL
Cholesterol: 225 mg/dL — ABNORMAL HIGH (ref 0–200)
HDL: 57.7 mg/dL (ref >39.00)
LDL Cholesterol: 139 mg/dL — ABNORMAL HIGH (ref 0–99)
NonHDL: 167.6
Total CHOL/HDL Ratio: 4
Triglycerides: 142 mg/dL (ref 0.0–149.0)
VLDL: 28.4 mg/dL (ref 0.0–40.0)

## 2023-03-15 LAB — COMPREHENSIVE METABOLIC PANEL
ALT: 22 U/L (ref 0–53)
AST: 24 U/L (ref 0–37)
Albumin: 4.4 g/dL (ref 3.5–5.2)
Alkaline Phosphatase: 73 U/L (ref 39–117)
BUN: 21 mg/dL (ref 6–23)
CO2: 25 mEq/L (ref 19–32)
Calcium: 10 mg/dL (ref 8.4–10.5)
Chloride: 104 mEq/L (ref 96–112)
Creatinine, Ser: 0.88 mg/dL (ref 0.40–1.50)
GFR: 84.67 mL/min (ref >60.00)
Glucose, Bld: 114 mg/dL — ABNORMAL HIGH (ref 70–99)
Potassium: 5.4 mEq/L — ABNORMAL HIGH (ref 3.5–5.1)
Sodium: 136 mEq/L (ref 135–145)
Total Bilirubin: 0.6 mg/dL (ref 0.2–1.2)
Total Protein: 7.1 g/dL (ref 6.0–8.3)

## 2023-03-15 LAB — TSH: TSH: 2.67 u[IU]/mL (ref 0.35–5.50)

## 2023-03-15 LAB — CBC WITH DIFFERENTIAL/PLATELET
Basophils Absolute: 0.1 10*3/uL (ref 0.0–0.1)
Basophils Relative: 0.8 % (ref 0.0–3.0)
Eosinophils Absolute: 0.4 10*3/uL (ref 0.0–0.7)
Eosinophils Relative: 5.9 % — ABNORMAL HIGH (ref 0.0–5.0)
HCT: 39.4 % (ref 39.0–52.0)
Hemoglobin: 13.9 g/dL (ref 13.0–17.0)
Lymphocytes Relative: 35.6 % (ref 12.0–46.0)
Lymphs Abs: 2.3 10*3/uL (ref 0.7–4.0)
MCHC: 35.3 g/dL (ref 30.0–36.0)
MCV: 95 fl (ref 78.0–100.0)
Monocytes Absolute: 0.6 10*3/uL (ref 0.1–1.0)
Monocytes Relative: 9.5 % (ref 3.0–12.0)
Neutro Abs: 3.1 10*3/uL (ref 1.4–7.7)
Neutrophils Relative %: 48.2 % (ref 43.0–77.0)
Platelets: 171 10*3/uL (ref 150.0–400.0)
RBC: 4.15 Mil/uL — ABNORMAL LOW (ref 4.22–5.81)
RDW: 12.9 % (ref 11.5–15.5)
WBC: 6.5 10*3/uL (ref 4.0–10.5)

## 2023-03-15 LAB — PSA, MEDICARE: PSA: 3.01 ng/ml (ref 0.10–4.00)

## 2023-03-15 LAB — HEMOGLOBIN A1C: Hgb A1c MFr Bld: 5.5 % (ref 4.6–6.5)

## 2023-03-22 ENCOUNTER — Ambulatory Visit (INDEPENDENT_AMBULATORY_CARE_PROVIDER_SITE_OTHER): Payer: Medicare HMO | Admitting: Family Medicine

## 2023-03-22 ENCOUNTER — Encounter: Payer: Self-pay | Admitting: Family Medicine

## 2023-03-22 VITALS — BP 138/70 | HR 87 | Temp 98.0°F | Ht 67.75 in | Wt 220.1 lb

## 2023-03-22 DIAGNOSIS — E7800 Pure hypercholesterolemia, unspecified: Secondary | ICD-10-CM | POA: Diagnosis not present

## 2023-03-22 DIAGNOSIS — Z125 Encounter for screening for malignant neoplasm of prostate: Secondary | ICD-10-CM | POA: Diagnosis not present

## 2023-03-22 DIAGNOSIS — Z0000 Encounter for general adult medical examination without abnormal findings: Secondary | ICD-10-CM

## 2023-03-22 DIAGNOSIS — D696 Thrombocytopenia, unspecified: Secondary | ICD-10-CM

## 2023-03-22 DIAGNOSIS — R3911 Hesitancy of micturition: Secondary | ICD-10-CM | POA: Diagnosis not present

## 2023-03-22 DIAGNOSIS — E039 Hypothyroidism, unspecified: Secondary | ICD-10-CM

## 2023-03-22 DIAGNOSIS — Z1211 Encounter for screening for malignant neoplasm of colon: Secondary | ICD-10-CM

## 2023-03-22 DIAGNOSIS — E875 Hyperkalemia: Secondary | ICD-10-CM | POA: Diagnosis not present

## 2023-03-22 DIAGNOSIS — I10 Essential (primary) hypertension: Secondary | ICD-10-CM

## 2023-03-22 DIAGNOSIS — R7303 Prediabetes: Secondary | ICD-10-CM | POA: Diagnosis not present

## 2023-03-22 NOTE — Progress Notes (Unsigned)
Subjective:    Patient ID: [name], male    DOB: [birth year], 77 y.o.   MRN: [id number]  HPI Here for health maintenance exam and to review chronic medical problems   Wt Readings from Last 3 Encounters:  03/22/23 220 lb 2 oz (99.8 kg)  08/08/22 216 lb 8 oz (98.2 kg)  06/11/22 214 lb (97.1 kg)   33.72 kg/m  Vitals:   03/22/23 0921 03/22/23 1001  BP: (!) 152/72 138/70  Pulse: 87   Temp: 98 F (36.7 C)   SpO2: 98%       Tetanus shot : needs   Colonoscopy 08/2021 with 3 y recall  Then was told no recall    Prostate health Lab Results  Component Value Date   PSA 3.01 03/15/2023   PSA 3.22 03/05/2022   PSA 1.50 03/01/2021   Was ref to urology last year  He never went   Thinks PGF had prostate cancer late in life   Nocturia once per night Has had a few issues with problems passing urine     Etoh intake : better than it was  4 beers during the day / few drinks at night  Does want to keep reducing that     Mood : has been good  PHQ of 0   Goes to gym 5 d per week  Some kayaking  Does some water running in the pool     HTN bp is stable today  No cp or palpitations or headaches or edema  No side effects to medicines  BP Readings from Last 3 Encounters:  03/22/23 138/70  08/08/22 138/70  06/11/22 122/66     Amlodipine 5 mg daily  Lisinopril 10 mg daily   [facility]   Usually 130s /80s  138/81 recent     Last metabolic panel Lab Results  Component Value Date   GLUCOSE 114 (H) 03/15/2023   NA 136 03/15/2023   K 5.4 No hemolysis seen (H) 03/15/2023   CL 104 03/15/2023   CO2 25 03/15/2023   BUN 21 03/15/2023   CREATININE 0.88 03/15/2023   GFRNONAA >60 02/13/2017   CALCIUM 10.0 03/15/2023   PHOS 3.2 04/05/2010   PROT 7.1 03/15/2023   ALBUMIN 4.4 03/15/2023   BILITOT 0.6 03/15/2023   ALKPHOS 73 03/15/2023   AST 24 03/15/2023   ALT 22 03/15/2023   ANIONGAP 9 02/13/2017   GFR is 84.6 On  lisinopril Drinks tart cherry   Prediabetes Lab Results  Component Value Date   HGBA1C 5.5 03/15/2023    Hypothyroidism  Pt has no clinical changes No change in energy level/ hair or skin/ edema and no tremor Lab Results  Component Value Date   TSH 2.67 03/15/2023    Levothyroxine 25 mcg daily   Lab Results  Component Value Date   WBC 6.5 03/15/2023   HGB 13.9 03/15/2023   HCT 39.4 03/15/2023   MCV 95.0 03/15/2023   PLT 171.0 03/15/2023   Platelet ct is normal now   Hyperlipidemia Lab Results  Component Value Date   CHOL 225 (H) 03/15/2023   CHOL 177 03/05/2022   CHOL 217 (H) 03/01/2021   Lab Results  Component Value Date   HDL 57.70 03/15/2023   HDL 82.70 03/05/2022   HDL 73.50 03/01/2021   Lab Results  Component Value Date   LDLCALC 139 (H) 03/15/2023   LDLCALC 81 03/05/2022   LDLCALC 129 (H) 03/01/2021   Lab Results  Component Value Date   TRIG 142.0 03/15/2023   TRIG 66.0 03/05/2022   TRIG 74.0 03/01/2021   Lab Results  Component Value Date   CHOLHDL 4 03/15/2023   CHOLHDL 2 03/05/2022   CHOLHDL 3 03/01/2021   Lab Results  Component Value Date   LDLDIRECT 130.0 01/06/2013   LDLDIRECT 143.1 10/01/2011   LDLDIRECT 139.4 04/05/2010  Atorvastatin 40 mg daily  No missed doses at all   Did not eat differently   Patient Active Problem List   Diagnosis Date Noted   Urinary hesitancy 03/22/2023   Hyperkalemia 03/22/2023   Hypothyroid 03/08/2021   History of colon polyps 03/08/2021   Alcohol intake above recommended sensible limits 05/13/2019   Hyperlipidemia 02/25/2019   Colon cancer screening 02/12/2017   Routine general medical examination at a health care facility 01/20/2016   Heart murmur, systolic 01/13/2014   Elevated PSA 01/13/2014   Encounter for Medicare annual wellness exam 09/24/2011   Prostate cancer screening 09/24/2011   Osteoarthritis, knee 02/14/2011   TINNITUS 12/09/2009   BACK PAIN, LUMBAR, WITH RADICULOPATHY 10/10/2009    Essential hypertension, benign 08/16/2009   Thrombocytopenia (HCC) 02/15/2009   Prediabetes 01/07/2008   Past Medical History:  Diagnosis Date   Arthritis    LEFT knee   Back pain    Diverticulosis    Dyspnea    with exertion   Heart murmur    History of colon polyps    Hyperlipidemia    on meds   Hypertension    on meds   Thyroid disease    on meds   Past Surgical History:  Procedure Laterality Date   COLONOSCOPY  2018   SA-MAC-[id number]-ageq with lavage-tics/int hems/TA x 5 frags   INGUINAL HERNIA REPAIR Bilateral 02/13/2017   Procedure: LAPAROSCOPIC BILATERAL INGUINAL HERNIA REPAIR WITH MESH;  Surgeon: Abigail Miyamoto, MD;  Location: [facility] OR;  Service: General;  Laterality: Bilateral;   LASIK     POLYPECTOMY  2018   TA x 5   UMBILICAL HERNIA REPAIR N/A 02/13/2017   Procedure: LAPAROSCOPIC UMBILICAL HERNIA REPAIR;  Surgeon: Abigail Miyamoto, MD;  Location: [facility] OR;  Service: General;  Laterality: N/A;   WISDOM TOOTH EXTRACTION     Social History   Tobacco Use   Smoking status: Never   Smokeless tobacco: Never  Vaping Use   Vaping Use: Never used  Substance Use Topics   Alcohol use: Yes    Alcohol/week: 21.0 - 28.0 standard drinks of alcohol    Types: 21 - 28 Cans of beer per week    Comment: 3-4 cans of beer daily   Drug use: No   Family History  Problem Relation Age of Onset   Cancer Mother        brain tumor   Emphysema Father    Diabetes Father    HIV Brother    Colon cancer Neg Hx    Colon polyps Neg Hx    Esophageal cancer Neg Hx    Rectal cancer Neg Hx    Stomach cancer Neg Hx    Allergies  Allergen Reactions   Ezetimibe-Simvastatin     REACTION: back pain   Current Outpatient Medications on File Prior to Visit  Medication Sig Dispense Refill   amLODipine (NORVASC) 5 MG tablet TAKE 1 TABLET BY MOUTH EVERY DAY IN THE EVENING 90 tablet 3   atorvastatin (LIPITOR) 40 MG tablet TAKE 1 TABLET BY MOUTH EVERY DAY IN THE EVENING 90 tablet 3    doxylamine, Sleep, (  UNISOM) 25 MG tablet Take 25 mg by mouth at bedtime.     glucosamine-chondroitin 500-400 MG tablet Take 1 tablet by mouth daily at 6 (six) AM.     levothyroxine (SYNTHROID) 25 MCG tablet Take 1 tablet (25 mcg total) by mouth daily before breakfast. 90 tablet 3   lisinopril (ZESTRIL) 10 MG tablet TAKE 1 TABLET (10 MG TOTAL) BY MOUTH DAILY IN THE MORNING 90 tablet 0   No current facility-administered medications on file prior to visit.       Review of Systems  Constitutional:  Negative for activity change, appetite change, fatigue, fever and unexpected weight change.  HENT:  Negative for congestion, rhinorrhea, sore throat and trouble swallowing.   Eyes:  Negative for pain, redness, itching and visual disturbance.  Respiratory:  Negative for cough, chest tightness, shortness of breath and wheezing.   Cardiovascular:  Negative for chest pain and palpitations.  Gastrointestinal:  Negative for abdominal pain, blood in stool, constipation, diarrhea and nausea.  Endocrine: Negative for cold intolerance, heat intolerance, polydipsia and polyuria.  Genitourinary:  Negative for difficulty urinating, dysuria, frequency and urgency.  Musculoskeletal:  Positive for arthralgias. Negative for joint swelling and myalgias.  Skin:  Negative for pallor and rash.  Neurological:  Negative for dizziness, tremors, weakness, numbness and headaches.  Hematological:  Negative for adenopathy. Does not bruise/bleed easily.  Psychiatric/Behavioral:  Negative for decreased concentration and dysphoric mood. The patient is not nervous/anxious.        Objective:   Physical Exam Constitutional:      General: He is not in acute distress.    Appearance: Normal appearance. He is well-developed. He is obese. He is not ill-appearing or diaphoretic.  HENT:     Head: Normocephalic and atraumatic.     Right Ear: Tympanic membrane, ear canal and external ear normal.     Left Ear: Tympanic membrane, ear  canal and external ear normal.     Nose: Nose normal. No congestion.     Mouth/Throat:     Mouth: Mucous membranes are moist.     Pharynx: Oropharynx is clear. No posterior oropharyngeal erythema.  Eyes:     General: No scleral icterus.       Right eye: No discharge.        Left eye: No discharge.     Conjunctiva/sclera: Conjunctivae normal.     Pupils: Pupils are equal, round, and reactive to light.  Neck:     Thyroid: No thyromegaly.     Vascular: No carotid bruit or JVD.  Cardiovascular:     Rate and Rhythm: Normal rate and regular rhythm.     Pulses: Normal pulses.     Heart sounds: Normal heart sounds.     No gallop.  Pulmonary:     Effort: Pulmonary effort is normal. No respiratory distress.     Breath sounds: Normal breath sounds. No wheezing or rales.     Comments: Good air exch Chest:     Chest wall: No tenderness.  Abdominal:     General: Bowel sounds are normal. There is no distension or abdominal bruit.     Palpations: Abdomen is soft. There is no mass.     Tenderness: There is no abdominal tenderness.     Hernia: No hernia is present.  Musculoskeletal:        General: No tenderness.     Cervical back: Normal range of motion and neck supple. No rigidity. No muscular tenderness.     Right lower  leg: No edema.     Left lower leg: No edema.  Lymphadenopathy:     Cervical: No cervical adenopathy.  Skin:    General: Skin is warm and dry.     Coloration: Skin is not pale.     Findings: No erythema or rash.     Comments: Tanned Solar lentigines diffusely   Neurological:     Mental Status: He is alert.     Cranial Nerves: No cranial nerve deficit.     Motor: No abnormal muscle tone.     Coordination: Coordination normal.     Gait: Gait normal.     Deep Tendon Reflexes: Reflexes are normal and symmetric. Reflexes normal.  Psychiatric:        Mood and Affect: Mood normal.        Cognition and Memory: Cognition normal.           Assessment & Plan:    Problem List Items Addressed This Visit       Cardiovascular and Mediastinum   Essential hypertension, benign    bp in fair control at this time  BP Readings from Last 1 Encounters:  03/22/23 138/70  No changes needed Most recent labs reviewed  Disc lifstyle change with low sodium diet and exercise  Plan to continue amlodipine 5 mg daily and lisinopril 10 mg daily         Endocrine   Hypothyroid    Hypothyroidism  Pt has no clinical changes No change in energy level/ hair or skin/ edema and no tremor Lab Results  Component Value Date   TSH 2.67 03/15/2023    Plan to continue levothyroxine 25 mcg daily         Hematopoietic and Hemostatic   Thrombocytopenia (HCC)    Plt ct is normal this draw at 171        Other   Urinary hesitancy    This comes and goes Nocturia times one  Psa stable Referred to urology      Relevant Orders   Ambulatory referral to Urology   Routine general medical examination at a health care facility - Primary    Reviewed health habits including diet and exercise and skin cancer prevention Reviewed appropriate screening tests for age  Also reviewed health mt list, fam hx and immunization status , as well as social and family history   See HPI Labs reviewed and ordered  Due for Td- will inquire at pharmacy Colonoscopy 08/2021 - per pt no recall/ report mentions 3 y Psa stable- was ref to urology  Encouraged to continue cutting ETOH intake  PHQ of 0       Prostate cancer screening    Lab Results  Component Value Date   PSA 3.01 03/15/2023   PSA 3.22 03/05/2022   PSA 1.50 03/01/2021   This is stable Pt never went to urology last year as planned  Ref done to discu screening and possible BPH      Relevant Orders   Ambulatory referral to Urology   Prediabetes    Lab Results  Component Value Date   HGBA1C 5.5 03/15/2023  disc imp of low glycemic diet and wt loss to prevent DM2       Hyperlipidemia    Disc goals for lipids  and reasons to control them Rev last labs with pt Rev low sat fat diet in detail Atorvastatin 40 mg daily  LDL up to 139 (no missed doses )  This may be due to  glucosamine - will hold this and re check in 3 months       Hyperkalemia     Lab Results  Component Value Date   K 5.4 No hemolysis seen (H) 03/15/2023  On lisinopril Also some nsaids Will plan a re check       Relevant Orders   Basic metabolic panel   Colon cancer screening    Colonoscopy 08/2021  ? 3 y recall vs no recall due to age

## 2023-03-22 NOTE — Patient Instructions (Addendum)
You are due for a tetanus shot  Check on that at a pharmacy    I put the referral in for urology  Please let us know if you don't hear in 1-2 weeks   Keep working on cutting back alcohol    Hold the ibuprofen  The voltaren gel is fine  We need to re check potassium about after stopping the ibuprofen   Watch your diet for cholesterol Avoid red meat/ fried foods/ egg yolks/ fatty breakfast meats/ butter, cheese and high fat dairy/ and shellfish   The glucosamine may increase cholesterol Hold it for now  Let's re check cholesterol in 3 months

## 2023-03-25 NOTE — Assessment & Plan Note (Signed)
bp in fair control at this time  BP Readings from Last 1 Encounters:  03/22/23 138/70   No changes needed Most recent labs reviewed  Disc lifstyle change with low sodium diet and exercise  Plan to continue amlodipine 5 mg daily and lisinopril 10 mg daily

## 2023-03-25 NOTE — Assessment & Plan Note (Signed)
  Lab Results  Component Value Date   K 5.4 No hemolysis seen (H) 03/15/2023   On lisinopril Also some nsaids Will plan a re check

## 2023-03-25 NOTE — Assessment & Plan Note (Signed)
Lab Results  Component Value Date   HGBA1C 5.5 03/15/2023   disc imp of low glycemic diet and wt loss to prevent DM2

## 2023-03-25 NOTE — Assessment & Plan Note (Signed)
Reviewed health habits including diet and exercise and skin cancer prevention Reviewed appropriate screening tests for age  Also reviewed health mt list, fam hx and immunization status , as well as social and family history   See HPI Labs reviewed and ordered  Due for Td- will inquire at pharmacy Colonoscopy 08/2021 - per pt no recall/ report mentions 3 y Psa stable- was ref to urology  Encouraged to continue cutting ETOH intake  PHQ of 0

## 2023-03-25 NOTE — Assessment & Plan Note (Signed)
This comes and goes Nocturia times one  Psa stable Referred to urology

## 2023-03-25 NOTE — Assessment & Plan Note (Signed)
Lab Results  Component Value Date   PSA 3.01 03/15/2023   PSA 3.22 03/05/2022   PSA 1.50 03/01/2021    This is stable Pt never went to urology last year as planned  Ref done to discu screening and possible BPH

## 2023-03-25 NOTE — Assessment & Plan Note (Signed)
Colonoscopy 08/2021  ? 3 y recall vs no recall due to age

## 2023-03-25 NOTE — Assessment & Plan Note (Signed)
Plt ct is normal this draw at 171

## 2023-03-25 NOTE — Assessment & Plan Note (Signed)
Disc goals for lipids and reasons to control them Rev last labs with pt Rev low sat fat diet in detail Atorvastatin 40 mg daily  LDL up to 139 (no missed doses )  This may be due to glucosamine - will hold this and re check in 3 months

## 2023-03-25 NOTE — Assessment & Plan Note (Signed)
Hypothyroidism  Pt has no clinical changes No change in energy level/ hair or skin/ edema and no tremor Lab Results  Component Value Date   TSH 2.67 03/15/2023    Plan to continue levothyroxine 25 mcg daily

## 2023-03-27 ENCOUNTER — Encounter: Payer: Self-pay | Admitting: *Deleted

## 2023-04-01 ENCOUNTER — Other Ambulatory Visit (INDEPENDENT_AMBULATORY_CARE_PROVIDER_SITE_OTHER): Payer: Medicare HMO

## 2023-04-01 DIAGNOSIS — E875 Hyperkalemia: Secondary | ICD-10-CM | POA: Diagnosis not present

## 2023-04-02 LAB — BASIC METABOLIC PANEL
BUN: 19 mg/dL (ref 6–23)
CO2: 25 mEq/L (ref 19–32)
Calcium: 9.2 mg/dL (ref 8.4–10.5)
Chloride: 104 mEq/L (ref 96–112)
Creatinine, Ser: 0.9 mg/dL (ref 0.40–1.50)
GFR: 84.07 mL/min (ref >60.00)
Glucose, Bld: 87 mg/dL (ref 70–99)
Potassium: 4.3 mEq/L (ref 3.5–5.1)
Sodium: 138 mEq/L (ref 135–145)

## 2023-04-19 ENCOUNTER — Other Ambulatory Visit: Payer: Self-pay | Admitting: Family Medicine

## 2023-05-03 DIAGNOSIS — Z125 Encounter for screening for malignant neoplasm of prostate: Secondary | ICD-10-CM | POA: Diagnosis not present

## 2023-05-09 ENCOUNTER — Other Ambulatory Visit: Payer: Self-pay | Admitting: Family Medicine

## 2023-05-16 ENCOUNTER — Other Ambulatory Visit: Payer: Self-pay | Admitting: Family Medicine

## 2023-05-18 NOTE — Progress Notes (Unsigned)
    Khaila Velarde T. Less Woolsey, MD, CAQ Sports Medicine [facility] [address] [place], [address]  Phone: [phone]  FAX: [phone]  Ramesses Crampton - 77 y.o. male  MRN [id number]  Date of Birth: 1945/12/27  Date: 05/20/2023  PCP: Judy Pimple, MD  Referral: Judy Pimple, MD  No chief complaint on file.  Subjective:   Davinder Haff is a 77 y.o. very pleasant male patient with There is no height or weight on file to calculate BMI. who presents with the following:  Patient is here for follow-up of left-sided knee pain and osteoarthritis.  I have injected his knee previously few times over the years.  Presents today with some intermittent ongoing arthritis flare.  Aspiration/Injection Procedure Note Sye Schroepfer 01-18-1946 Date of procedure: 05/20/2023  Procedure: Large Joint Aspiration / Injection of Knee, L Indications: Pain  Procedure Details Patient verbally consented to procedure. Risks, benefits, and alternatives explained. Sterilely prepped with Chloraprep. Ethyl cholride used for anesthesia. 9 cc Lidocaine 1% mixed with 1 mL of Kenalog 40 mg injected using the anteromedial approach without difficulty. No complications with procedure and tolerated well. Patient had decreased pain post-injection. Medication: 1 mL of Kenalog 40 mg

## 2023-05-20 ENCOUNTER — Encounter: Payer: Self-pay | Admitting: Family Medicine

## 2023-05-20 ENCOUNTER — Ambulatory Visit: Payer: Medicare HMO | Admitting: Family Medicine

## 2023-05-20 VITALS — BP 130/60 | HR 104 | Temp 98.0°F | Ht 67.75 in | Wt 219.5 lb

## 2023-05-20 DIAGNOSIS — M1712 Unilateral primary osteoarthritis, left knee: Secondary | ICD-10-CM | POA: Diagnosis not present

## 2023-05-20 MED ORDER — TRIAMCINOLONE ACETONIDE 40 MG/ML IJ SUSP
40.0000 mg | Freq: Once | INTRAMUSCULAR | Status: AC
Start: 2023-05-20 — End: 2023-05-20
  Administered 2023-05-20: 40 mg via INTRA_ARTICULAR

## 2023-05-20 NOTE — Addendum Note (Signed)
Addended by: Damita Lack on: 05/20/2023 12:22 PM   Modules accepted: Orders

## 2023-05-25 ENCOUNTER — Other Ambulatory Visit: Payer: Self-pay | Admitting: Family Medicine

## 2023-05-27 ENCOUNTER — Encounter: Payer: Self-pay | Admitting: Family Medicine

## 2023-05-27 ENCOUNTER — Ambulatory Visit (INDEPENDENT_AMBULATORY_CARE_PROVIDER_SITE_OTHER): Payer: Medicare HMO | Admitting: Family Medicine

## 2023-05-27 VITALS — BP 142/71 | HR 78 | Temp 97.6°F | Ht 67.75 in | Wt 219.0 lb

## 2023-05-27 DIAGNOSIS — R0789 Other chest pain: Secondary | ICD-10-CM | POA: Diagnosis not present

## 2023-05-27 DIAGNOSIS — R7303 Prediabetes: Secondary | ICD-10-CM | POA: Diagnosis not present

## 2023-05-27 DIAGNOSIS — E7800 Pure hypercholesterolemia, unspecified: Secondary | ICD-10-CM

## 2023-05-27 DIAGNOSIS — I10 Essential (primary) hypertension: Secondary | ICD-10-CM

## 2023-05-27 DIAGNOSIS — R011 Cardiac murmur, unspecified: Secondary | ICD-10-CM

## 2023-05-27 LAB — COMPREHENSIVE METABOLIC PANEL
ALT: 27 U/L (ref 0–53)
AST: 26 U/L (ref 0–37)
Albumin: 4.8 g/dL (ref 3.5–5.2)
Alkaline Phosphatase: 64 U/L (ref 39–117)
BUN: 17 mg/dL (ref 6–23)
CO2: 24 mEq/L (ref 19–32)
Calcium: 10.2 mg/dL (ref 8.4–10.5)
Chloride: 103 mEq/L (ref 96–112)
Creatinine, Ser: 0.92 mg/dL (ref 0.40–1.50)
GFR: 81.8 mL/min (ref >60.00)
Glucose, Bld: 117 mg/dL — ABNORMAL HIGH (ref 70–99)
Potassium: 4.6 mEq/L (ref 3.5–5.1)
Sodium: 137 mEq/L (ref 135–145)
Total Bilirubin: 0.9 mg/dL (ref 0.2–1.2)
Total Protein: 7.5 g/dL (ref 6.0–8.3)

## 2023-05-27 LAB — LIPID PANEL
Cholesterol: 247 mg/dL — ABNORMAL HIGH (ref 0–200)
HDL: 82.9 mg/dL (ref >39.00)
LDL Cholesterol: 143 mg/dL — ABNORMAL HIGH (ref 0–99)
NonHDL: 163.87
Total CHOL/HDL Ratio: 3
Triglycerides: 104 mg/dL (ref 0.0–149.0)
VLDL: 20.8 mg/dL (ref 0.0–40.0)

## 2023-05-27 NOTE — Assessment & Plan Note (Signed)
Blood pressure is up a bit today   BP: (!) 142/71  Has been lower at home and other offices (120s systolic) Amlodipine 5 mg daily  Lisinopril 10 mg daily   Has had some exertional chest pressure lately  (feels fine today) Referred to cardiology

## 2023-05-27 NOTE — Assessment & Plan Note (Signed)
Lab Results  Component Value Date   HGBA1C 5.5 03/15/2023

## 2023-05-27 NOTE — Assessment & Plan Note (Signed)
Systolic  Reviewed echo from 2015 ? 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

## 2023-05-27 NOTE — Progress Notes (Signed)
Subjective:    Patient ID: [name], male    DOB: 1946/07/16, 77 y.o.   MRN: [id number]  HPI  Wt Readings from Last 3 Encounters:  05/27/23 219 lb (99.3 kg)  05/20/23 219 lb 8 oz (99.6 kg)  03/22/23 220 lb 2 oz (99.8 kg)   33.55 kg/m  Vitals:   05/27/23 0748 05/27/23 0823  BP: (!) 160/88 (!) 142/71  Pulse: 78   Temp: 97.6 F (36.4 C)   SpO2: 95%    Pt presents with c/o chest tightness  This occurs with walking then improves when resting   Just during exertion  Happened on track at the [employer] during a stroll  Usually lasts 5-10 seconds after resting Not shortness of breath  In center /up high  No radiation  Yesterday he was sweating (but was hot outside)    BP BP Readings from Last 3 Encounters:  05/27/23 (!) 142/71  05/20/23 130/60  03/22/23 138/70   Amlodipine 5 mg daily  Lisinopril 10 mg daily   Lab Results  Component Value Date   CHOL 225 (H) 03/15/2023   HDL 57.70 03/15/2023   LDLCALC 139 (H) 03/15/2023   LDLDIRECT 130.0 01/06/2013   TRIG 142.0 03/15/2023   CHOLHDL 4 03/15/2023   Takes atorvastatin 40 mg daily  Had taken some glucosamine last time - LDL was up (from 81 prior)    Prediabetes Lab Results  Component Value Date   HGBA1C 5.5 03/15/2023   Lab Results  Component Value Date   NA 138 04/01/2023   K 4.3 04/01/2023   CO2 25 04/01/2023   GLUCOSE 87 04/01/2023   BUN 19 04/01/2023   CREATININE 0.90 04/01/2023   CALCIUM 9.2 04/01/2023   GFR 84.07 04/01/2023   GFRNONAA >60 02/13/2017     Has history of systolic heart murmur in the past   Hypothyroid Lab Results  Component Value Date   TSH 2.67 03/15/2023   Levothyroxine 25 mcg daily    Had 2D echo in 2015 Study Conclusions   - Left ventricle: The cavity size was normal. Systolic    function was normal. Wall motion was normal; there were no    regional wall motion abnormalities.  - Aortic valve: Valve area: 2.43cm^2(VTI). Valve area:    2.12cm^2 (Vmax).  - some thickening of aorit leaflets but not stenosis  - Left atrium: The atrium was mildly dilated.  - Right atrium: The atrium was mildly dilated.    EKG today  Sinus rhythm with first degree av block Rate of 84 Some artifact      Patient Active Problem List   Diagnosis Date Noted   Chest pressure 05/27/2023   Urinary hesitancy 03/22/2023   Hyperkalemia 03/22/2023   Hypothyroid 03/08/2021   History of colon polyps 03/08/2021   Alcohol intake above recommended sensible limits 05/13/2019   Hyperlipidemia 02/25/2019   Colon cancer screening 02/12/2017   Routine general medical examination at a health care facility 01/20/2016   Heart murmur, systolic 01/13/2014   Elevated PSA 01/13/2014   Encounter for Medicare annual wellness exam 09/24/2011   Prostate cancer screening 09/24/2011   Osteoarthritis, knee 02/14/2011   TINNITUS 12/09/2009   BACK PAIN, LUMBAR, WITH RADICULOPATHY 10/10/2009   Essential hypertension, benign 08/16/2009   Thrombocytopenia (HCC) 02/15/2009   Prediabetes 01/07/2008   Past Medical History:  Diagnosis Date   Arthritis    LEFT knee   Back pain    Diverticulosis    Dyspnea  with exertion   Heart murmur    History of colon polyps    Hyperlipidemia    on meds   Hypertension    on meds   Thyroid disease    on meds   Past Surgical History:  Procedure Laterality Date   COLONOSCOPY  2018   SA-MAC-suprep-ageq with lavage-tics/int hems/TA x 5 frags   INGUINAL HERNIA REPAIR Bilateral 02/13/2017   Procedure: LAPAROSCOPIC BILATERAL INGUINAL HERNIA REPAIR WITH MESH;  Surgeon: Abigail Miyamoto, MD;  Location: [facility] OR;  Service: General;  Laterality: Bilateral;   LASIK     POLYPECTOMY  2018   TA x 5   UMBILICAL HERNIA REPAIR N/A 02/13/2017   Procedure: LAPAROSCOPIC UMBILICAL HERNIA REPAIR;  Surgeon: Abigail Miyamoto, MD;  Location: [facility] OR;  Service: General;  Laterality: N/A;   WISDOM TOOTH EXTRACTION     Social History   Tobacco Use   Smoking  status: Never   Smokeless tobacco: Never  Vaping Use   Vaping status: Never Used  Substance Use Topics   Alcohol use: Yes    Alcohol/week: 21.0 - 28.0 standard drinks of alcohol    Types: 21 - 28 Cans of beer per week    Comment: 3-4 cans of beer daily   Drug use: No   Family History  Problem Relation Age of Onset   Cancer Mother        brain tumor   Emphysema Father    Diabetes Father    HIV Brother    Colon cancer Neg Hx    Colon polyps Neg Hx    Esophageal cancer Neg Hx    Rectal cancer Neg Hx    Stomach cancer Neg Hx    Allergies  Allergen Reactions   Ezetimibe-Simvastatin     REACTION: back pain   Current Outpatient Medications on File Prior to Visit  Medication Sig Dispense Refill   amLODipine (NORVASC) 5 MG tablet TAKE 1 TABLET BY MOUTH EVERY DAY IN THE EVENING 90 tablet 1   atorvastatin (LIPITOR) 40 MG tablet TAKE 1 TABLET BY MOUTH EVERY DAY IN THE EVENING 90 tablet 2   doxylamine, Sleep, (UNISOM) 25 MG tablet Take 25 mg by mouth at bedtime.     levothyroxine (SYNTHROID) 25 MCG tablet TAKE 1 TABLET BY MOUTH DAILY BEFORE BREAKFAST. 90 tablet 2   lisinopril (ZESTRIL) 10 MG tablet TAKE 1 TABLET (10 MG TOTAL) BY MOUTH DAILY IN THE MORNING 90 tablet 0   No current facility-administered medications on file prior to visit.    Review of Systems  Constitutional:  Negative for activity change, appetite change, fatigue, fever and unexpected weight change.  HENT:  Negative for congestion, rhinorrhea, sore throat and trouble swallowing.   Eyes:  Negative for pain, redness, itching and visual disturbance.  Respiratory:  Positive for chest tightness. Negative for apnea, cough, choking, shortness of breath, wheezing and stridor.   Cardiovascular:  Negative for chest pain and palpitations.  Gastrointestinal:  Negative for abdominal pain, blood in stool, constipation, diarrhea and nausea.  Endocrine: Negative for cold intolerance, heat intolerance, polydipsia and polyuria.   Genitourinary:  Negative for difficulty urinating, dysuria, frequency and urgency.  Musculoskeletal:  Negative for arthralgias, joint swelling and myalgias.  Skin:  Negative for pallor and rash.  Neurological:  Negative for dizziness, tremors, weakness, numbness and headaches.  Hematological:  Negative for adenopathy. Does not bruise/bleed easily.  Psychiatric/Behavioral:  Negative for decreased concentration and dysphoric mood. The patient is not nervous/anxious.  Objective:   Physical Exam Constitutional:      General: He is not in acute distress.    Appearance: Normal appearance. He is well-developed. He is not ill-appearing or diaphoretic.  HENT:     Head: Normocephalic and atraumatic.     Mouth/Throat:     Mouth: Mucous membranes are moist.  Eyes:     General: No scleral icterus.    Conjunctiva/sclera: Conjunctivae normal.     Pupils: Pupils are equal, round, and reactive to light.  Neck:     Thyroid: No thyromegaly.     Vascular: No carotid bruit or JVD.  Cardiovascular:     Rate and Rhythm: Normal rate and regular rhythm.     Heart sounds: Murmur heard.     No gallop.  Pulmonary:     Effort: Pulmonary effort is normal. No respiratory distress.     Breath sounds: Normal breath sounds. No stridor. No wheezing, rhonchi or rales.  Chest:     Chest wall: No tenderness.  Abdominal:     General: There is no distension or abdominal bruit.     Palpations: Abdomen is soft.  Musculoskeletal:     Cervical back: Normal range of motion and neck supple.     Right lower leg: No edema.     Left lower leg: No edema.     Comments: No leg edema or tenderness No palpable cord   Lymphadenopathy:     Cervical: No cervical adenopathy.  Skin:    General: Skin is warm and dry.     Coloration: Skin is not pale.     Findings: No rash.  Neurological:     Mental Status: He is alert.     Motor: No weakness.     Coordination: Coordination normal.     Deep Tendon Reflexes: Reflexes  are normal and symmetric. Reflexes normal.  Psychiatric:        Mood and Affect: Mood normal.           Assessment & Plan:   Problem List Items Addressed This Visit       Cardiovascular and Mediastinum   Essential hypertension, benign    Blood pressure is up a bit today   BP: (!) 142/71  Has been lower at home and other offices (120s systolic) Amlodipine 5 mg daily  Lisinopril 10 mg daily   Has had some exertional chest pressure lately  (feels fine today) Referred to cardiology       Relevant Orders   EKG 12-Lead (Completed)   Lipid panel   Comprehensive metabolic panel     Other   Prediabetes    Lab Results  Component Value Date   HGBA1C 5.5 03/15/2023         Hyperlipidemia    Disc goals for lipids and reasons to control them Rev last labs with pt Rev low sat fat diet in detail  Last LDL up - ? Due to glucosamine  Is off that now On atorvastatin 40 mg daily  Lab fasting now       Relevant Orders   Lipid panel   Heart murmur, systolic    Systolic  Reviewed echo from 2015 ? If possible developing aortic sclerosis  Having some exertional chest symptoms now  Ref to cardiology done      Relevant Orders   Ambulatory referral to Cardiology   Chest pressure - Primary    Exertional -in pt who exercises regularly at gym Resolves with rest / usually brief Has  a heart M systolic (years)- reviewed echo from 2015   HTN and hyperlipidemia  Referral to cardiology done-urgent       Relevant Orders   EKG 12-Lead (Completed)   Ambulatory referral to Cardiology

## 2023-05-27 NOTE — Assessment & Plan Note (Signed)
Disc goals for lipids and reasons to control them Rev last labs with pt Rev low sat fat diet in detail  Last LDL up - ? Due to glucosamine  Is off that now On atorvastatin 40 mg daily  Lab fasting now

## 2023-05-27 NOTE — Patient Instructions (Signed)
For now - hold off exercise If symptoms worsen or persist please go to the ER   I placed a referral to cardiology  You will get call - (if not today please give [place] a call)   Labs today

## 2023-05-27 NOTE — Assessment & Plan Note (Signed)
Exertional -in pt who exercises regularly at gym Resolves with rest / usually brief Has a heart M systolic (years)- reviewed echo from 2015   HTN and hyperlipidemia  Referral to cardiology done-urgent

## 2023-05-29 ENCOUNTER — Other Ambulatory Visit: Payer: Self-pay | Admitting: *Deleted

## 2023-05-29 MED ORDER — ROSUVASTATIN CALCIUM 20 MG PO TABS: 20.0000 mg | ORAL_TABLET | Freq: Every day | ORAL | 1 refills | Status: DC

## 2023-06-06 ENCOUNTER — Ambulatory Visit: Payer: Medicare HMO | Attending: Cardiology | Admitting: Cardiology

## 2023-06-06 ENCOUNTER — Encounter: Payer: Self-pay | Admitting: Cardiology

## 2023-06-06 VITALS — BP 170/70 | HR 78 | Ht 67.5 in | Wt 216.0 lb

## 2023-06-06 DIAGNOSIS — R011 Cardiac murmur, unspecified: Secondary | ICD-10-CM | POA: Diagnosis not present

## 2023-06-06 DIAGNOSIS — I10 Essential (primary) hypertension: Secondary | ICD-10-CM | POA: Diagnosis not present

## 2023-06-06 DIAGNOSIS — Z01812 Encounter for preprocedural laboratory examination: Secondary | ICD-10-CM

## 2023-06-06 DIAGNOSIS — R0789 Other chest pain: Secondary | ICD-10-CM

## 2023-06-06 DIAGNOSIS — R079 Chest pain, unspecified: Secondary | ICD-10-CM | POA: Diagnosis not present

## 2023-06-06 LAB — BASIC METABOLIC PANEL
BUN/Creatinine Ratio: 22 (ref 10–24)
BUN: 16 mg/dL (ref 8–27)
CO2: 23 mmol/L (ref 20–29)
Calcium: 10.3 mg/dL — ABNORMAL HIGH (ref 8.6–10.2)
Chloride: 101 mmol/L (ref 96–106)
Creatinine, Ser: 0.73 mg/dL — ABNORMAL LOW (ref 0.76–1.27)
Glucose: 113 mg/dL — ABNORMAL HIGH (ref 70–99)
Potassium: 5.6 mmol/L — ABNORMAL HIGH (ref 3.5–5.2)
Sodium: 138 mmol/L (ref 134–144)
eGFR: 95 mL/min/{1.73_m2} (ref >59)

## 2023-06-06 MED ORDER — METOPROLOL TARTRATE 100 MG PO TABS: 100.0000 mg | ORAL_TABLET | Freq: Once | ORAL | 0 refills | Status: DC

## 2023-06-06 NOTE — Patient Instructions (Addendum)
Medication Instructions:  Your physician recommends that you continue on your current medications as directed. Please refer to the Current Medication list given to you today.  *If you need a refill on your cardiac medications before your next appointment, please call your pharmacy*   Lab Work: Please complete a BMET in our lab before you leave today.  If you have labs (blood work) drawn today and your tests are completely normal, you will receive your results only by: MyChart Message (if you have MyChart) OR A paper copy in the mail If you have any lab test that is abnormal or we need to change your treatment, we will call you to review the results.   Testing/Procedures: Your physician has requested that you have an echocardiogram. Echocardiography is a painless test that uses sound waves to create images of your heart. It provides your doctor with information about the size and shape of your heart and how well your heart's chambers and valves are working. This procedure takes approximately one hour. There are no restrictions for this procedure. Please do NOT wear cologne, perfume, aftershave, or lotions (deodorant is allowed). Please arrive 15 minutes prior to your appointment time..    Your cardiac CT will be scheduled at:   [facility] [address] [place], [place] [address] [phone]  Please arrive at the [facility] and Children's Entrance (Entrance C2) of [facility] 30 minutes prior to test start time. You can use the FREE valet parking offered at entrance C (encouraged to control the heart rate for the test)  Proceed to the [facility] Radiology Department (first floor) to check-in and test prep.  All radiology patients and guests should use entrance C2 at [facility], accessed from [facility], even though the hospital's physical address listed is [address]     Please follow these instructions carefully (unless  otherwise directed):  An IV will be required for this test and Nitroglycerin will be given.  Hold all erectile dysfunction medications at least 3 days (72 hrs) prior to test. (Ie viagra, cialis, sildenafil, tadalafil, etc)   On the Night Before the Test: Be sure to Drink plenty of water. Do not consume any caffeinated/decaffeinated beverages or chocolate 12 hours prior to your test. Do not take any antihistamines 12 hours prior to your test.   On the Day of the Test: Drink plenty of water until 1 hour prior to the test. Do not eat any food 1 hour prior to test. You may take your regular medications prior to the test.  Take metoprolol (Lopressor) two hours prior to test.       After the Test: Drink plenty of water. After receiving IV contrast, you may experience a mild flushed feeling. This is normal. On occasion, you may experience a mild rash up to 24 hours after the test. This is not dangerous. If this occurs, you can take Benadryl 25 mg and increase your fluid intake. If you experience trouble breathing, this can be serious. If it is severe call 911 IMMEDIATELY. If it is mild, please call our office. If you take any of these medications: Glipizide/Metformin, Avandament, Glucavance, please do not take 48 hours after completing test unless otherwise instructed.  We will call to schedule your test 2-4 weeks out understanding that some insurance companies will need an authorization prior to the service being performed.   For more information and frequently asked questions, please visit our website : [email or web address]  For  non-scheduling related questions, please contact the cardiac imaging nurse navigator should you have any questions/concerns: Cardiac Imaging Nurse Navigators Direct Office Dial: [phone]   For scheduling needs, including cancellations and rescheduling, please call [place], [phone].    Follow-Up: At [facility], you and your  health needs are our priority.  As part of our continuing mission to provide you with exceptional heart care, we have created designated Provider Care Teams.  These Care Teams include your primary Cardiologist (physician) and Advanced Practice Providers (APPs -  Physician Assistants and Nurse Practitioners) who all work together to provide you with the care you need, when you need it.  We recommend signing up for the patient portal called "MyChart".  Sign up information is provided on this After Visit Summary.  MyChart is used to connect with patients for Virtual Visits (Telemedicine).  Patients are able to view lab/test results, encounter notes, upcoming appointments, etc.  Non-urgent messages can be sent to your provider as well.   To learn more about what you can do with MyChart, go to [email or web address].    Your next appointment will be dependent on the results of your testing and it will be with:     Provider:   Dr. Armanda Magic, MD   Further Instructions: Please check your blood pressure at home twice a day, at lunch and at dinner. Do this for one week, then call our office at [phone] to report your readings. All our operators are trained to take down your readings. You can also drop off your readings at the front desk or submit them over MyChart.

## 2023-06-06 NOTE — Progress Notes (Addendum)
Cardiology CONSULT Note    Date:  06/06/2023   ID:  [name], DOB [birth year], MRN [id number]  PCP:  Judy Pimple, MD  Cardiologist:  Armanda Magic, MD   Chief Complaint  Patient presents with   New Patient (Initial Visit)    Chest pain    Patient Profile: [name] is a 77 y.o. male who is being seen today for the evaluation of chest pain and heart murmur at the request of Tower, Audrie Gallus, MD.  History of Present Illness:  [name] is a 77 y.o. male who is being seen today for the evaluation of chest pain and heart murmur at the request of Tower, Audrie Gallus, MD.  This is a 77 year old male with a history of hyperlipidemia, hypertension and shortness of breath.  He is now being referred for evaluation of chest pain as well as a heart murmur.  He has a history of a heart murmur in the past and 2D echo in 2015 showed normal LV function with mild aortic valve sclerosis no stenosis, mild biatrial enlargement, trivial MR and TR.  He tells me that he has been having chest pain for a month and has occurred 2-3 times after strenuous exercise.  He had 1 episode while walking home from a neighbor in the extreme heat.  The other episodes occurred while walking fast on the track at the [facility].  He describes it as mid sternal tightness with no radiation.  During 1 episode he got diaphoretic but no nausea or SOB.  He denies any DOE, LE edema, dizziness, syncope or palpitations. He has not had any recent long travel.  EKG done in PCP office was normal.   Past Medical History:  Diagnosis Date   Arthritis    LEFT knee   Back pain    Diverticulosis    Dyspnea    with exertion   Heart murmur    History of colon polyps    Hyperlipidemia    on meds   Hypertension    on meds   Thyroid disease    on meds    Past Surgical History:  Procedure Laterality Date   COLONOSCOPY  2018   SA-MAC-suprep-ageq with lavage-tics/int hems/TA x 5 frags   INGUINAL HERNIA REPAIR Bilateral  02/13/2017   Procedure: LAPAROSCOPIC BILATERAL INGUINAL HERNIA REPAIR WITH MESH;  Surgeon: Abigail Miyamoto, MD;  Location: [facility] OR;  Service: General;  Laterality: Bilateral;   LASIK     POLYPECTOMY  2018   TA x 5   UMBILICAL HERNIA REPAIR N/A 02/13/2017   Procedure: LAPAROSCOPIC UMBILICAL HERNIA REPAIR;  Surgeon: Abigail Miyamoto, MD;  Location: [facility] OR;  Service: General;  Laterality: N/A;   WISDOM TOOTH EXTRACTION      Current Medications: Current Meds  Medication Sig   amLODipine (NORVASC) 5 MG tablet TAKE 1 TABLET BY MOUTH EVERY DAY IN THE EVENING   doxylamine, Sleep, (UNISOM) 25 MG tablet Take 25 mg by mouth at bedtime.   levothyroxine (SYNTHROID) 25 MCG tablet TAKE 1 TABLET BY MOUTH DAILY BEFORE BREAKFAST.   lisinopril (ZESTRIL) 10 MG tablet TAKE 1 TABLET (10 MG TOTAL) BY MOUTH DAILY IN THE MORNING   rosuvastatin (CRESTOR) 20 MG tablet Take 1 tablet (20 mg total) by mouth daily.    Allergies:   Ezetimibe-simvastatin   Social History   Socioeconomic History   Marital status: Married    Spouse name: Not on file   Number of children: 1   Years of  education: Not on file   Highest education level: Not on file  Occupational History    Employer: [employer]  Tobacco Use   Smoking status: Never   Smokeless tobacco: Never  Vaping Use   Vaping status: Never Used  Substance and Sexual Activity   Alcohol use: Yes    Alcohol/week: 21.0 - 28.0 standard drinks of alcohol    Types: 21 - 28 Cans of beer per week    Comment: 3-4 cans of beer daily   Drug use: No   Sexual activity: Yes  Other Topics Concern   Not on file  Social History Narrative   Not on file   Social Determinants of Health   Financial Resource Strain: Not on file  Food Insecurity: Not on file  Transportation Needs: Not on file  Physical Activity: Not on file  Stress: Not on file  Social Connections: Not on file     Family History:  The patient's family history includes Cancer in his mother; Diabetes in  his father; Emphysema in his father; HIV in his brother.   ROS:   Please see the history of present illness.    ROS All other systems reviewed and are negative.      No data to display             PHYSICAL EXAM:   VS:  BP (!) 180/82   Pulse 78   Ht 5' 7.5" (1.715 m)   Wt 216 lb (98 kg)   SpO2 99%   BMI 33.33 kg/m    GEN: Well nourished, well developed, in no acute distress  HEENT: normal  Neck: no JVD, carotid bruits, or masses Cardiac: RRR; no  rubs, or gallops,no edema.  Intact distal pulses bilaterally. 2/6 SM at RUSB to LLSB Respiratory:  clear to auscultation bilaterally, normal work of breathing GI: soft, nontender, nondistended, + BS MS: no deformity or atrophy  Skin: warm and dry, no rash Neuro:  Alert and Oriented x 3, Strength and sensation are intact Psych: euthymic mood, full affect  Wt Readings from Last 3 Encounters:  06/06/23 216 lb (98 kg)  05/27/23 219 lb (99.3 kg)  05/20/23 219 lb 8 oz (99.6 kg)      Studies/Labs Reviewed:           Recent Labs: 03/15/2023: Hemoglobin 13.9; Platelets 171.0; TSH 2.67 05/27/2023: ALT 27; BUN 17; Creatinine, Ser 0.92; Potassium 4.6; Sodium 137   Lipid Panel    Component Value Date/Time   CHOL 247 (H) 05/27/2023 0838   TRIG 104.0 05/27/2023 0838   HDL 82.90 05/27/2023 0838   CHOLHDL 3 05/27/2023 0838   VLDL 20.8 05/27/2023 0838   LDLCALC 143 (H) 05/27/2023 0838   LDLDIRECT 130.0 01/06/2013 0815       ASSESSMENT:    1. Chest pressure   2. Heart murmur, systolic   3. Essential hypertension, benign      PLAN:  In order of problems listed above:  Chest pain -only occurs with exertion over the past month and is chest pressure -EKG in PCP office 7/29 was normal -CRF include HTN and HLD -will get a coronary CTA to define coronary anatomy and rule out CAD  2.  Heart murmur -2D echo in 2015 for heart murmur showed mild aortic valve sclerosis with no stenosis as well as trivial MR and TR  -I will  get a 2D echo to assess heart murmur  3.  Hypertension -BP is elevated on exam today and review  of office visit by PCP on 729 it was 142/71.  Apparently it is documented in his PCP office note that he has had lower blood pressures at home and at other offices mainly with 120 systolic. -Continue prescription drug management with amlodipine 5 mg daily and Zestril 10 mg daily with PRN Refills -I have asked him to check his blood pressure twice daily for a week and forward results to me  Time Spent: 20 minutes total time of encounter, including 15 minutes spent in face-to-face patient care on the date of this encounter. This time includes coordination of care and counseling regarding above mentioned problem list. Remainder of non-face-to-face time involved reviewing chart documents/testing relevant to the patient encounter and documentation in the medical record. I have independently reviewed documentation from referring provider  Followup:  PRN  Medication Adjustments/Labs and Tests Ordered: Current medicines are reviewed at length with the patient today.  Concerns regarding medicines are outlined above.  Medication changes, Labs and Tests ordered today are listed in the Patient Instructions below.  There are no Patient Instructions on file for this visit.   Signed, Armanda Magic, MD  06/06/2023 10:58 AM    [facility] [address] [place], [place], [place]  [address] Phone: [phone]; Fax: [phone]

## 2023-06-06 NOTE — Addendum Note (Signed)
Addended by: Rexene Edison L on: 06/06/2023 11:06 AM   Modules accepted: Orders

## 2023-06-07 ENCOUNTER — Telehealth: Payer: Self-pay

## 2023-06-07 NOTE — Telephone Encounter (Signed)
Called patient to discuss elevated K+ value, patient states back in May his K+ was slightly elevated at 5.4 and his PCP took him off Advil to help get it lower. He denies any supplements or diet trends that include large amounts of potassium. Patient responses forwarded to Dr. Mayford Knife.

## 2023-06-07 NOTE — Telephone Encounter (Signed)
-----   Message from Armanda Magic sent at 06/06/2023  4:53 PM EDT ----- K+ too high = please find out if he is taking a vitamin or any potassium suppl

## 2023-06-09 NOTE — Telephone Encounter (Signed)
Please advise him to hold his lisinopril  If he can check blood pressure at home- do so and let us know how it is   If not - schedule follow up here in about 6-8 weeks to see how blood pressure is and decide if we need to use something else  Thanks

## 2023-06-10 NOTE — Telephone Encounter (Signed)
Pt notified of Dr. Royden Purl comments. Pt does have a way to check BP. Will keep an eye on it and log his readings. Pt advise to either call us with BP readings or send mychart with BP readings and PCP will advise further

## 2023-06-11 ENCOUNTER — Ambulatory Visit (HOSPITAL_BASED_OUTPATIENT_CLINIC_OR_DEPARTMENT_OTHER)
Admission: RE | Admit: 2023-06-11 | Discharge: 2023-06-11 | Disposition: A | Discharge status: Home / Self Care | Payer: Medicare HMO | Source: Ambulatory Visit | Attending: Cardiology | Admitting: Cardiology

## 2023-06-11 ENCOUNTER — Ambulatory Visit (HOSPITAL_COMMUNITY)
Admission: RE | Admit: 2023-06-11 | Discharge: 2023-06-11 | Disposition: A | Discharge status: Home / Self Care | Payer: Medicare HMO | Source: Ambulatory Visit | Attending: Cardiology | Admitting: Cardiology

## 2023-06-11 ENCOUNTER — Other Ambulatory Visit: Payer: Self-pay | Admitting: Cardiology

## 2023-06-11 DIAGNOSIS — I2510 Atherosclerotic heart disease of native coronary artery without angina pectoris: Secondary | ICD-10-CM | POA: Diagnosis not present

## 2023-06-11 DIAGNOSIS — R931 Abnormal findings on diagnostic imaging of heart and coronary circulation: Secondary | ICD-10-CM | POA: Insufficient documentation

## 2023-06-11 DIAGNOSIS — R079 Chest pain, unspecified: Secondary | ICD-10-CM | POA: Insufficient documentation

## 2023-06-11 MED ORDER — IOHEXOL 350 MG/ML SOLN
95.0000 mL | Freq: Once | INTRAVENOUS | Status: AC | PRN
  Administered 2023-06-11: 95 mL via INTRAVENOUS

## 2023-06-11 MED ORDER — NITROGLYCERIN 0.4 MG SL SUBL
0.8000 mg | SUBLINGUAL_TABLET | Freq: Once | SUBLINGUAL | Status: AC
  Administered 2023-06-11: 0.8 mg via SUBLINGUAL

## 2023-06-11 MED ORDER — NITROGLYCERIN 0.4 MG SL SUBL
SUBLINGUAL_TABLET | SUBLINGUAL | Status: AC
  Filled 2023-06-11: qty 2

## 2023-06-13 ENCOUNTER — Encounter (INDEPENDENT_AMBULATORY_CARE_PROVIDER_SITE_OTHER): Payer: Self-pay

## 2023-06-13 ENCOUNTER — Encounter: Payer: Self-pay | Admitting: Family Medicine

## 2023-06-13 MED ORDER — AMLODIPINE BESYLATE 10 MG PO TABS: 10.0000 mg | ORAL_TABLET | Freq: Every day | ORAL | 0 refills | Status: DC

## 2023-06-14 ENCOUNTER — Telehealth: Payer: Self-pay

## 2023-06-14 NOTE — Telephone Encounter (Signed)
Called to discuss CTA results, no answer. No DPR on file, left message with no identifiers asking recipient to call our office.

## 2023-06-14 NOTE — Telephone Encounter (Signed)
-----   Message from Armanda Magic sent at 06/12/2023  2:40 PM EDT ----- Coronary CTA demonstrated a coronary calcium score of 3670 which is very high.  There is less than 25% stenosis of the mid to distal RCA, minimal calcification of the distal left main artery, 50 to 69% long calcified plaque in the proximal and mid LAD extending into the proximal portion of the distal LAD.  There is 25 to 59% mild plaque in the D1 and moderate plaque in the D2 there is moderate  calcification of the left circumflex.  There is also calcification of the mitral annulus and aortic valve.  FFR has been submitted and is pending which will determine further management.  His PCP recently change his statin to Crestor 20 mg daily.  Please have him increase the Crestor to 40 mg daily and check an FLP and ALT in 6 weeks.  Start aspirin 81 mg daily.  Will determine follow-up once we get FFR results back

## 2023-06-20 MED ORDER — HYDROCHLOROTHIAZIDE 25 MG PO TABS: 25.0000 mg | ORAL_TABLET | Freq: Every day | ORAL | 0 refills | Status: DC

## 2023-06-20 NOTE — Addendum Note (Signed)
Addended by: Roxy Manns A on: 06/20/2023 05:21 PM   Modules accepted: Orders

## 2023-06-24 ENCOUNTER — Other Ambulatory Visit: Payer: Medicare HMO

## 2023-06-24 ENCOUNTER — Telehealth: Payer: Self-pay

## 2023-06-24 DIAGNOSIS — Z79899 Other long term (current) drug therapy: Secondary | ICD-10-CM

## 2023-06-24 DIAGNOSIS — E7800 Pure hypercholesterolemia, unspecified: Secondary | ICD-10-CM

## 2023-06-24 MED ORDER — ASPIRIN 81 MG PO TBEC: 81.0000 mg | DELAYED_RELEASE_TABLET | Freq: Every day | ORAL | 3 refills | Status: DC

## 2023-06-24 MED ORDER — ROSUVASTATIN CALCIUM 40 MG PO TABS
40.0000 mg | ORAL_TABLET | Freq: Every day | ORAL | 3 refills | Status: DC
Start: 2023-06-24 — End: 2023-07-02

## 2023-06-24 NOTE — Telephone Encounter (Signed)
-----   Message from Armanda Magic sent at 06/12/2023  2:40 PM EDT ----- Coronary CTA demonstrated a coronary calcium score of 3670 which is very high.  There is less than 25% stenosis of the mid to distal RCA, minimal calcification of the distal left main artery, 50 to 69% long calcified plaque in the proximal and mid LAD extending into the proximal portion of the distal LAD.  There is 25 to 59% mild plaque in the D1 and moderate plaque in the D2 there is moderate  calcification of the left circumflex.  There is also calcification of the mitral annulus and aortic valve.  FFR has been submitted and is pending which will determine further management.  His PCP recently change his statin to Crestor 20 mg daily.  Please have him increase the Crestor to 40 mg daily and check an FLP and ALT in 6 weeks.  Start aspirin 81 mg daily.  Will determine follow-up once we get FFR results back

## 2023-06-24 NOTE — Telephone Encounter (Signed)
Called patient to review Coronary CTA. Noncardiac portion of CT showed bilateral gynecomastia which is mild and relates to increased breast tissue, education provided to patient and forwarded to his PCP to follow-up on.  Advised patient that FFR also showed evidence of stenosis in the mid to distal LAD that was significant flow-limiting.  Set patient up for appt w/ T. Asa Lente 06/27/23 to get set up for left heart catheterization this week.   Provided education that Coronary CTA demonstrated a coronary calcium score of 3670 which is very high. There is less than 25% stenosis of the mid to distal RCA, minimal calcification of the distal left main artery, 50 to 69% long calcified plaque in the proximal and mid LAD extending into the proximal portion of the distal LAD. There is 25 to 59% mild plaque in the D1 and moderate plaque in the D2 there is moderate calcification of the left circumflex. There is also calcification of the mitral annulus and aortic valve. Patient agrees to increase his Crestor to 40 mg daily and check an FLP and ALT in 6 weeks. Labs scheduled, patient also agrees to start aspirin 81 mg daily. Med list updated.

## 2023-06-27 ENCOUNTER — Ambulatory Visit (HOSPITAL_COMMUNITY): Payer: Medicare HMO | Attending: Cardiology

## 2023-06-27 ENCOUNTER — Ambulatory Visit (INDEPENDENT_AMBULATORY_CARE_PROVIDER_SITE_OTHER): Payer: Medicare HMO | Admitting: Physician Assistant

## 2023-06-27 ENCOUNTER — Encounter: Payer: Self-pay | Admitting: Physician Assistant

## 2023-06-27 VITALS — BP 144/80 | HR 106 | Ht 67.5 in | Wt 216.6 lb

## 2023-06-27 DIAGNOSIS — I10 Essential (primary) hypertension: Secondary | ICD-10-CM

## 2023-06-27 DIAGNOSIS — I350 Nonrheumatic aortic (valve) stenosis: Secondary | ICD-10-CM | POA: Insufficient documentation

## 2023-06-27 DIAGNOSIS — I5030 Unspecified diastolic (congestive) heart failure: Secondary | ICD-10-CM | POA: Diagnosis not present

## 2023-06-27 DIAGNOSIS — R0789 Other chest pain: Secondary | ICD-10-CM

## 2023-06-27 DIAGNOSIS — R011 Cardiac murmur, unspecified: Secondary | ICD-10-CM | POA: Diagnosis not present

## 2023-06-27 DIAGNOSIS — I080 Rheumatic disorders of both mitral and aortic valves: Secondary | ICD-10-CM | POA: Diagnosis not present

## 2023-06-27 NOTE — H&P (View-Only) (Signed)
 Cardiology Office Note:  .   Date:  06/27/2023  ID:  [name], DOB [birth year], MRN [id number] PCP: Judy Pimple, MD  [facility] Providers Cardiologist:  None {   History of Present Illness: .   [name] is a 77 y.o. male who was seen recently for evaluation of chest pain and heart murmur.  Past medical history includes hyperlipidemia, hypertension, shortness of breath.  Referred initially for evaluation of chest pain as well as heart murmur.  History of heart murmur in the past with 2D echocardiogram ~15 which showed normal LV function and mild aortic valve sclerosis, no stenosis, mild biatrial enlargement, trivial MR and TR.  Seen by Dr. Mayford Knife 06/06/2023 with history of chest pain for a month which occurs 2-3 times after strenuous exercise.  He had 1 episode while walking home from the neighbors in extreme heat.  The other occurred while he was walking fast on the track at the [facility].  Describes as mild sternal tightness with no radiation.  During 1 episode he got diaphoretic but no nausea or shortness of breath.  Denies any DOE, lower extremity edema, dizziness, syncope or palpitations.  Has not had any recent long travel.  EKG done at PCP office was normal.  Today, he tells me that he is here to discuss cardiac catheterization.  Although, just had echocardiogram done earlier today and report was available to view.  Did discuss his severe aortic stenosis with AVA area of 1.1.  We went over the risks versus reward of cardiac catheterization as well as to the CT scan that he had a few weeks ago with FFR.  He was having some chest pain with activity but has scaled back quite a bit on activity and has not had any repeat chest pain.  He has not had any lightheadedness dizziness or syncope or near syncope.  He has a river cruise planned in mid September with his wife and wants to try to still enjoy that.  Reports no shortness of breath nor dyspnea on exertion. Reports no chest  pain, pressure, or tightness. No edema, orthopnea, PND. Reports no palpitations.    ROS: Pertinent ROS in HPI  Studies Reviewed: Marland Kitchen   EKG Interpretation Date/Time:  Thursday June 27 2023 14:45:48 EDT Ventricular Rate:  106 PR Interval:  224 QRS Duration:  88 QT Interval:  346 QTC Calculation: 459 R Axis:   22  Text Interpretation: Sinus tachycardia with 1st degree A-V block with Premature supraventricular complexes Nonspecific ST abnormality When compared with ECG of 13-Feb-2017 07:37, No significant change was found Confirmed by Jari Favre [phone]) on 06/27/2023 4:33:51 PM   Coronary CT scan 06/11/2023 FINDINGS: FFRct analysis was performed on the original cardiac CT angiogram dataset. Diagrammatic representation of the FFRct analysis is provided in a separate PDF document in PACS. This dictation was created using the PDF document and an interactive 3D model of the results. 3D model is not available in the EMR/PACS. Normal FFR range is >0.80. Indeterminate (grey) zone is 0.76-0.80.   1. Left Main: FFR = 1.00   2. LAD: Proximal FFR = 0.98, mid FFR = 0.73, distal FFR = 0.69 3. LCX: Proximal FFR = 0.93, distal FFR = 0.59 4. RCA: Proximal FFR = 0.91, mid FFR =0.87, distal FFR = 0.79   IMPRESSION: 1. CT FFR analysis showed significant stenosis in the mid to distal LAD.   Echocardiogram 06/27/2023 IMPRESSIONS     1. Left ventricular ejection fraction, by estimation, is >75%.  The left  ventricle has hyperdynamic function. The left ventricle has no regional  wall motion abnormalities. There is moderate concentric left ventricular  hypertrophy. Left ventricular  diastolic parameters are consistent with Grade I diastolic dysfunction  (impaired relaxation). Elevated left atrial pressure. The average left  ventricular global longitudinal strain is -16.6 %. The global longitudinal  strain is normal.   2. Right ventricular systolic function is normal. The right ventricular  size is  normal.   3. The mitral valve is normal in structure. Trivial mitral valve  regurgitation. No evidence of mitral stenosis. Moderate mitral annular  calcification.   4. The aortic valve is calcified. Aortic valve regurgitation is not  visualized. Severe aortic valve stenosis.   5. The inferior vena cava is normal in size with greater than 50%  respiratory variability, suggesting right atrial pressure of 3 mmHg.   Comparison(s): No prior Echocardiogram.   FINDINGS   Left Ventricle: Left ventricular ejection fraction, by estimation, is  >75%. The left ventricle has hyperdynamic function. The left ventricle has  no regional wall motion abnormalities. The average left ventricular global  longitudinal strain is -16.6 %.  The global longitudinal strain is normal. The left ventricular internal  cavity size was normal in size. There is moderate concentric left  ventricular hypertrophy. Left ventricular diastolic parameters are  consistent with Grade I diastolic dysfunction  (impaired relaxation). Elevated left atrial pressure.   Right Ventricle: The right ventricular size is normal. Right ventricular  systolic function is normal.   Left Atrium: Left atrial size was normal in size.   Right Atrium: Right atrial size was normal in size.   Pericardium: There is no evidence of pericardial effusion.   Mitral Valve: The mitral valve is normal in structure. Moderate mitral  annular calcification. Trivial mitral valve regurgitation. No evidence of  mitral valve stenosis. MV peak gradient, 6.7 mmHg. The mean mitral valve  gradient is 4.0 mmHg.   Tricuspid Valve: The tricuspid valve is normal in structure. Tricuspid  valve regurgitation is trivial. No evidence of tricuspid stenosis.   Aortic Valve: The aortic valve is calcified. Aortic valve regurgitation is  not visualized. Severe aortic stenosis is present. Aortic valve mean  gradient measures 40.0 mmHg. Aortic valve peak gradient measures  66.9  mmHg. Aortic valve area, by VTI measures   1.13 cm.   Pulmonic Valve: The pulmonic valve was normal in structure. Pulmonic valve  regurgitation is not visualized. No evidence of pulmonic stenosis.   Aorta: The aortic root is normal in size and structure.   Venous: The inferior vena cava is normal in size with greater than 50%  respiratory variability, suggesting right atrial pressure of 3 mmHg.   IAS/Shunts: No atrial level shunt detected by color flow Doppler.         Physical Exam:   VS:  BP (!) 144/80   Pulse (!) 106   Ht 5' 7.5" (1.715 m)   Wt 216 lb 9.6 oz (98.2 kg)   SpO2 96%   BMI 33.42 kg/m    Wt Readings from Last 3 Encounters:  06/27/23 216 lb 9.6 oz (98.2 kg)  06/06/23 216 lb (98 kg)  05/27/23 219 lb (99.3 kg)    GEN: Well nourished, well developed in no acute distress NECK: No JVD; No carotid bruits CARDIAC: RRR, 5 out of 6 blowing systolic murmur, rubs, gallops RESPIRATORY:  Clear to auscultation without rales, wheezing or rhonchi  ABDOMEN: Soft, non-tender, non-distended EXTREMITIES:  No edema; No deformity  ASSESSMENT AND PLAN: .   1.  Chest pressure/pain -cardiac cath scheduled today for 9/4 -Overall recommended continuing current medication regimen including amlodipine 10 mg daily, aspirin 81 mg daily, HCTZ 25 mg daily, Crestor 40 mg daily  2.  Heart murmur/severe aortic stenosis -Reviewed most recent echocardiogram with severe aortic stenosis and AVA of 1.1 -Will discuss with Dr. Mayford Knife if she wants TEE or direct referral to structural heart -Luckily no lightheadedness, dizziness, syncope, or presyncope  3.  Essential hypertension -Blood pressure slightly elevated at 144/80 -Would continue current blood pressure medications for now -Might need to add back lisinopril if remains elevated    Dispo: He can follow-up a week after cardiac catheterization with myself or APP  Signed, Sharlene Dory, PA-C

## 2023-06-27 NOTE — Progress Notes (Signed)
Cardiology Office Note:  .   Date:  06/27/2023  ID:  [name], DOB [birth year], MRN [id number] PCP: Judy Pimple, MD  [facility] Providers Cardiologist:  None {   History of Present Illness: .   [name] is a 77 y.o. male who was seen recently for evaluation of chest pain and heart murmur.  Past medical history includes hyperlipidemia, hypertension, shortness of breath.  Referred initially for evaluation of chest pain as well as heart murmur.  History of heart murmur in the past with 2D echocardiogram ~15 which showed normal LV function and mild aortic valve sclerosis, no stenosis, mild biatrial enlargement, trivial MR and TR.  Seen by Dr. Mayford Knife 06/06/2023 with history of chest pain for a month which occurs 2-3 times after strenuous exercise.  He had 1 episode while walking home from the neighbors in extreme heat.  The other occurred while he was walking fast on the track at the [facility].  Describes as mild sternal tightness with no radiation.  During 1 episode he got diaphoretic but no nausea or shortness of breath.  Denies any DOE, lower extremity edema, dizziness, syncope or palpitations.  Has not had any recent long travel.  EKG done at PCP office was normal.  Today, he tells me that he is here to discuss cardiac catheterization.  Although, just had echocardiogram done earlier today and report was available to view.  Did discuss his severe aortic stenosis with AVA area of 1.1.  We went over the risks versus reward of cardiac catheterization as well as to the CT scan that he had a few weeks ago with FFR.  He was having some chest pain with activity but has scaled back quite a bit on activity and has not had any repeat chest pain.  He has not had any lightheadedness dizziness or syncope or near syncope.  He has a river cruise planned in mid September with his wife and wants to try to still enjoy that.  Reports no shortness of breath nor dyspnea on exertion. Reports no chest  pain, pressure, or tightness. No edema, orthopnea, PND. Reports no palpitations.    ROS: Pertinent ROS in HPI  Studies Reviewed: Marland Kitchen   EKG Interpretation Date/Time:  Thursday June 27 2023 14:45:48 EDT Ventricular Rate:  106 PR Interval:  224 QRS Duration:  88 QT Interval:  346 QTC Calculation: 459 R Axis:   22  Text Interpretation: Sinus tachycardia with 1st degree A-V block with Premature supraventricular complexes Nonspecific ST abnormality When compared with ECG of 13-Feb-2017 07:37, No significant change was found Confirmed by Jari Favre [phone]) on 06/27/2023 4:33:51 PM   Coronary CT scan 06/11/2023 FINDINGS: FFRct analysis was performed on the original cardiac CT angiogram dataset. Diagrammatic representation of the FFRct analysis is provided in a separate PDF document in PACS. This dictation was created using the PDF document and an interactive 3D model of the results. 3D model is not available in the EMR/PACS. Normal FFR range is >0.80. Indeterminate (grey) zone is 0.76-0.80.   1. Left Main: FFR = 1.00   2. LAD: Proximal FFR = 0.98, mid FFR = 0.73, distal FFR = 0.69 3. LCX: Proximal FFR = 0.93, distal FFR = 0.59 4. RCA: Proximal FFR = 0.91, mid FFR =0.87, distal FFR = 0.79   IMPRESSION: 1. CT FFR analysis showed significant stenosis in the mid to distal LAD.   Echocardiogram 06/27/2023 IMPRESSIONS     1. Left ventricular ejection fraction, by estimation, is >75%.  The left  ventricle has hyperdynamic function. The left ventricle has no regional  wall motion abnormalities. There is moderate concentric left ventricular  hypertrophy. Left ventricular  diastolic parameters are consistent with Grade I diastolic dysfunction  (impaired relaxation). Elevated left atrial pressure. The average left  ventricular global longitudinal strain is -16.6 %. The global longitudinal  strain is normal.   2. Right ventricular systolic function is normal. The right ventricular  size is  normal.   3. The mitral valve is normal in structure. Trivial mitral valve  regurgitation. No evidence of mitral stenosis. Moderate mitral annular  calcification.   4. The aortic valve is calcified. Aortic valve regurgitation is not  visualized. Severe aortic valve stenosis.   5. The inferior vena cava is normal in size with greater than 50%  respiratory variability, suggesting right atrial pressure of 3 mmHg.   Comparison(s): No prior Echocardiogram.   FINDINGS   Left Ventricle: Left ventricular ejection fraction, by estimation, is  >75%. The left ventricle has hyperdynamic function. The left ventricle has  no regional wall motion abnormalities. The average left ventricular global  longitudinal strain is -16.6 %.  The global longitudinal strain is normal. The left ventricular internal  cavity size was normal in size. There is moderate concentric left  ventricular hypertrophy. Left ventricular diastolic parameters are  consistent with Grade I diastolic dysfunction  (impaired relaxation). Elevated left atrial pressure.   Right Ventricle: The right ventricular size is normal. Right ventricular  systolic function is normal.   Left Atrium: Left atrial size was normal in size.   Right Atrium: Right atrial size was normal in size.   Pericardium: There is no evidence of pericardial effusion.   Mitral Valve: The mitral valve is normal in structure. Moderate mitral  annular calcification. Trivial mitral valve regurgitation. No evidence of  mitral valve stenosis. MV peak gradient, 6.7 mmHg. The mean mitral valve  gradient is 4.0 mmHg.   Tricuspid Valve: The tricuspid valve is normal in structure. Tricuspid  valve regurgitation is trivial. No evidence of tricuspid stenosis.   Aortic Valve: The aortic valve is calcified. Aortic valve regurgitation is  not visualized. Severe aortic stenosis is present. Aortic valve mean  gradient measures 40.0 mmHg. Aortic valve peak gradient measures  66.9  mmHg. Aortic valve area, by VTI measures   1.13 cm.   Pulmonic Valve: The pulmonic valve was normal in structure. Pulmonic valve  regurgitation is not visualized. No evidence of pulmonic stenosis.   Aorta: The aortic root is normal in size and structure.   Venous: The inferior vena cava is normal in size with greater than 50%  respiratory variability, suggesting right atrial pressure of 3 mmHg.   IAS/Shunts: No atrial level shunt detected by color flow Doppler.         Physical Exam:   VS:  BP (!) 144/80   Pulse (!) 106   Ht 5' 7.5" (1.715 m)   Wt 216 lb 9.6 oz (98.2 kg)   SpO2 96%   BMI 33.42 kg/m    Wt Readings from Last 3 Encounters:  06/27/23 216 lb 9.6 oz (98.2 kg)  06/06/23 216 lb (98 kg)  05/27/23 219 lb (99.3 kg)    GEN: Well nourished, well developed in no acute distress NECK: No JVD; No carotid bruits CARDIAC: RRR, 5 out of 6 blowing systolic murmur, rubs, gallops RESPIRATORY:  Clear to auscultation without rales, wheezing or rhonchi  ABDOMEN: Soft, non-tender, non-distended EXTREMITIES:  No edema; No deformity  ASSESSMENT AND PLAN: .   1.  Chest pressure/pain -cardiac cath scheduled today for 9/4 -Overall recommended continuing current medication regimen including amlodipine 10 mg daily, aspirin 81 mg daily, HCTZ 25 mg daily, Crestor 40 mg daily  2.  Heart murmur/severe aortic stenosis -Reviewed most recent echocardiogram with severe aortic stenosis and AVA of 1.1 -Will discuss with Dr. Mayford Knife if she wants TEE or direct referral to structural heart -Luckily no lightheadedness, dizziness, syncope, or presyncope  3.  Essential hypertension -Blood pressure slightly elevated at 144/80 -Would continue current blood pressure medications for now -Might need to add back lisinopril if remains elevated    Dispo: He can follow-up a week after cardiac catheterization with myself or APP  Signed, Sharlene Dory, PA-C

## 2023-06-27 NOTE — Patient Instructions (Signed)
Medication Instructions:  Your physician recommends that you continue on your current medications as directed. Please refer to the Current Medication list given to you today.  *If you need a refill on your cardiac medications before your next appointment, please call your pharmacy*  Lab Work: BMET, CBC-TODAY If you have labs (blood work) drawn today and your tests are completely normal, you will receive your results only by: MyChart Message (if you have MyChart) OR A paper copy in the mail If you have any lab test that is abnormal or we need to change your treatment, we will call you to review the results.  Testing/Procedures:       Cardiac/Peripheral Catheterization   You are scheduled for a Cardiac Catheterization on Wednesday, September 4 with Dr. Alverda Skeans.  1. Please arrive at the [facility] (Main Entrance A) at [facility]: [address] [place], [place] [address] at 6:30 AM (This time is 2 hour(s) before your procedure to ensure your preparation). Free valet parking service is available. You will check in at ADMITTING. The support person will be asked to wait in the waiting room.  It is OK to have someone drop you off and come back when you are ready to be discharged.        Special note: Every effort is made to have your procedure done on time. Please understand that emergencies sometimes delay scheduled procedures.  2. Diet: Do not eat solid foods after midnight.  You may have clear liquids until 5 AM the day of the procedure.  3. Labs: You will need to have blood drawn today.  4. Medication instructions in preparation for your procedure:   Contrast Allergy: No    On the morning of your procedure, take Aspirin 81 mg and any morning medicines NOT listed above.  You may use sips of water.  5. Plan to go home the same day, you will only stay overnight if medically necessary. 6. You MUST have a responsible adult to drive you home. 7. An adult MUST be with you  the first 24 hours after you arrive home. 8. Bring a current list of your medications, and the last time and date medication taken. 9. Bring ID and current insurance cards. 10.Please wear clothes that are easy to get on and off and wear slip-on shoes.  Thank you for allowing [place] to care for you!   -- [facility]    Follow-Up: At [facility], you and your health needs are our priority.  As part of our continuing mission to provide you with exceptional heart care, we have created designated Provider Care Teams.  These Care Teams include your primary Cardiologist (physician) and Advanced Practice Providers (APPs -  Physician Assistants and Nurse Practitioners) who all work together to provide you with the care you need, when you need it.  Your next appointment:   After cath  Provider:   Dr Mayford Knife or APP  Heart-Healthy Eating Plan Many factors influence your heart health, including eating and exercise habits. Heart health is also called coronary health. Coronary risk increases with abnormal blood fat (lipid) levels. A heart-healthy eating plan includes limiting unhealthy fats, increasing healthy fats, limiting salt (sodium) intake, and making other diet and lifestyle changes. What is my plan? Your health care provider may recommend that: You limit your fat intake to _________% or less of your total calories each day. You limit your saturated fat intake to _________% or less of your  total calories each day. You limit the amount of cholesterol in your diet to less than _________ mg per day. You limit the amount of sodium in your diet to less than _________ mg per day. What are tips for following this plan? Cooking Cook foods using methods other than frying. Baking, boiling, grilling, and broiling are all good options. Other ways to reduce fat include: Removing the skin from poultry. Removing all visible fats from meats. Steaming vegetables in water  or broth. Meal planning  At meals, imagine dividing your plate into fourths: Fill one-half of your plate with vegetables and green salads. Fill one-fourth of your plate with whole grains. Fill one-fourth of your plate with lean protein foods. Eat 2-4 cups of vegetables per day. One cup of vegetables equals 1 cup (91 g) broccoli or cauliflower florets, 2 medium carrots, 1 large bell pepper, 1 large sweet potato, 1 large tomato, 1 medium white potato, 2 cups (150 g) raw leafy greens. Eat 1-2 cups of fruit per day. One cup of fruit equals 1 small apple, 1 large banana, 1 cup (237 g) mixed fruit, 1 large orange,  cup (82 g) dried fruit, 1 cup (240 mL) 100% fruit juice. Eat more foods that contain soluble fiber. Examples include apples, broccoli, carrots, beans, peas, and barley. Aim to get 25-30 g of fiber per day. Increase your consumption of legumes, nuts, and seeds to 4-5 servings per week. One serving of dried beans or legumes equals  cup (90 g) cooked, 1 serving of nuts is  oz (12 almonds, 24 pistachios, or 7 walnut halves), and 1 serving of seeds equals  oz (8 g). Fats Choose healthy fats more often. Choose monounsaturated and polyunsaturated fats, such as olive and canola oils, avocado oil, flaxseeds, walnuts, almonds, and seeds. Eat more omega-3 fats. Choose salmon, mackerel, sardines, tuna, flaxseed oil, and ground flaxseeds. Aim to eat fish at least 2 times each week. Check food labels carefully to identify foods with trans fats or high amounts of saturated fat. Limit saturated fats. These are found in animal products, such as meats, butter, and cream. Plant sources of saturated fats include palm oil, palm kernel oil, and coconut oil. Avoid foods with partially hydrogenated oils in them. These contain trans fats. Examples are stick margarine, some tub margarines, cookies, crackers, and other baked goods. Avoid fried foods. General information Eat more home-cooked food and less  restaurant, buffet, and fast food. Limit or avoid alcohol. Limit foods that are high in added sugar and simple starches such as foods made using white refined flour (white breads, pastries, sweets). Lose weight if you are overweight. Losing just 5-10% of your body weight can help your overall health and prevent diseases such as diabetes and heart disease. Monitor your sodium intake, especially if you have high blood pressure. Talk with your health care provider about your sodium intake. Try to incorporate more vegetarian meals weekly. What foods should I eat? Fruits All fresh, canned (in natural juice), or frozen fruits. Vegetables Fresh or frozen vegetables (raw, steamed, roasted, or grilled). Green salads. Grains Most grains. Choose whole wheat and whole grains most of the time. Rice and pasta, including brown rice and pastas made with whole wheat. Meats and other proteins Lean, well-trimmed beef, veal, pork, and lamb. Chicken and [place] without skin. All fish and shellfish. Wild duck, rabbit, pheasant, and venison. Egg whites or low-cholesterol egg substitutes. Dried beans, peas, lentils, and tofu. Seeds and most nuts. Dairy Low-fat or nonfat cheeses, including  ricotta and mozzarella. Skim or 1% milk (liquid, powdered, or evaporated). Buttermilk made with low-fat milk. Nonfat or low-fat yogurt. Fats and oils Non-hydrogenated (trans-free) margarines. Vegetable oils, including soybean, sesame, sunflower, olive, avocado, peanut, safflower, corn, canola, and cottonseed. Salad dressings or mayonnaise made with a vegetable oil. Beverages Water (mineral or sparkling). Coffee and tea. Unsweetened ice tea. Diet beverages. Sweets and desserts Sherbet, gelatin, and fruit ice. Small amounts of dark chocolate. Limit all sweets and desserts. Seasonings and condiments All seasonings and condiments. The items listed above may not be a complete list of foods and beverages you can eat. Contact a dietitian  for more options. What foods should I avoid? Fruits Canned fruit in heavy syrup. Fruit in cream or butter sauce. Fried fruit. Limit coconut. Vegetables Vegetables cooked in cheese, cream, or butter sauce. Fried vegetables. Grains Breads made with saturated or trans fats, oils, or whole milk. Croissants. Sweet rolls. Donuts. High-fat crackers, such as cheese crackers and chips. Meats and other proteins Fatty meats, such as hot dogs, ribs, sausage, bacon, rib-eye roast or steak. High-fat deli meats, such as salami and bologna. Caviar. Domestic duck and goose. Organ meats, such as liver. Dairy Cream, sour cream, cream cheese, and creamed cottage cheese. Whole-milk cheeses. Whole or 2% milk (liquid, evaporated, or condensed). Whole buttermilk. Cream sauce or high-fat cheese sauce. Whole-milk yogurt. Fats and oils Meat fat, or shortening. Cocoa butter, hydrogenated oils, palm oil, coconut oil, palm kernel oil. Solid fats and shortenings, including bacon fat, salt pork, lard, and butter. Nondairy cream substitutes. Salad dressings with cheese or sour cream. Beverages Regular sodas and any drinks with added sugar. Sweets and desserts Frosting. Pudding. Cookies. Cakes. Pies. Milk chocolate or white chocolate. Buttered syrups. Full-fat ice cream or ice cream drinks. The items listed above may not be a complete list of foods and beverages to avoid. Contact a dietitian for more information. Summary Heart-healthy meal planning includes limiting unhealthy fats, increasing healthy fats, limiting salt (sodium) intake and making other diet and lifestyle changes. Lose weight if you are overweight. Losing just 5-10% of your body weight can help your overall health and prevent diseases such as diabetes and heart disease. Focus on eating a balance of foods, including fruits and vegetables, low-fat or nonfat dairy, lean protein, nuts and legumes, whole grains, and heart-healthy oils and fats. This information is  not intended to replace advice given to you by your health care provider. Make sure you discuss any questions you have with your health care provider. Document Revised: 11/20/2021 Document Reviewed: 11/20/2021 Elsevier Patient Education  2024 [employer]. Low-Sodium Eating Plan Salt (sodium) helps you keep a healthy balance of fluids in your body. Too much sodium can raise your blood pressure. It can also cause fluid and waste to be held in your body. Your health care provider or dietitian may recommend a low-sodium eating plan if you have high blood pressure (hypertension), kidney disease, liver disease, or heart failure. Eating less sodium can help lower your blood pressure and reduce swelling. It can also protect your heart, liver, and kidneys. What are tips for following this plan? Reading food labels  Check food labels for the amount of sodium per serving. If you eat more than one serving, you must multiply the listed amount by the number of servings. Choose foods with less than 140 milligrams (mg) of sodium per serving. Avoid foods with 300 mg of sodium or more per serving. Always check how much sodium is in a product, even  if the label says "unsalted" or "no salt added." Shopping  Buy products labeled as "low-sodium" or "no salt added." Buy fresh foods. Avoid canned foods and pre-made or frozen meals. Avoid canned, cured, or processed meats. Buy breads that have less than 80 mg of sodium per slice. Cooking  Eat more home-cooked food. Try to eat less restaurant, buffet, and fast food. Try not to add salt when you cook. Use salt-free seasonings or herbs instead of table salt or sea salt. Check with your provider or pharmacist before using salt substitutes. Cook with plant-based oils, such as canola, sunflower, or olive oil. Meal planning When eating at a restaurant, ask if your food can be made with less salt or no salt. Avoid dishes labeled as brined, pickled, cured, or smoked.  Avoid dishes made with soy sauce, miso, or teriyaki sauce. Avoid foods that have monosodium glutamate (MSG) in them. MSG may be added to some restaurant food, sauces, soups, bouillon, and canned foods. Make meals that can be grilled, baked, poached, roasted, or steamed. These are often made with less sodium. General information Try to limit your sodium intake to 1,500-2,300 mg each day, or the amount told by your provider. What foods should I eat? Fruits Fresh, frozen, or canned fruit. Fruit juice. Vegetables Fresh or frozen vegetables. "No salt added" canned vegetables. "No salt added" tomato sauce and paste. Low-sodium or reduced-sodium tomato and vegetable juice. Grains Low-sodium cereals, such as oats, puffed wheat and rice, and shredded wheat. Low-sodium crackers. Unsalted rice. Unsalted pasta. Low-sodium bread. Whole grain breads and whole grain pasta. Meats and other proteins Fresh or frozen meat, poultry, seafood, and fish. These should have no added salt. Low-sodium canned tuna and salmon. Unsalted nuts. Dried peas, beans, and lentils without added salt. Unsalted canned beans. Eggs. Unsalted nut butters. Dairy Milk. Soy milk. Cheese that is naturally low in sodium, such as ricotta cheese, fresh mozzarella, or Swiss cheese. Low-sodium or reduced-sodium cheese. Cream cheese. Yogurt. Seasonings and condiments Fresh and dried herbs and spices. Salt-free seasonings. Low-sodium mustard and ketchup. Sodium-free salad dressing. Sodium-free light mayonnaise. Fresh or refrigerated horseradish. Lemon juice. Vinegar. Other foods Homemade, reduced-sodium, or low-sodium soups. Unsalted popcorn and pretzels. Low-salt or salt-free chips. The items listed above may not be all the foods and drinks you can have. Talk to a dietitian to learn more. What foods should I avoid? Vegetables Sauerkraut, pickled vegetables, and relishes. Olives. [place] fries. Onion rings. Regular canned vegetables, except  low-sodium or reduced-sodium items. Regular canned tomato sauce and paste. Regular tomato and vegetable juice. Frozen vegetables in sauces. Grains Instant hot cereals. Bread stuffing, pancake, and biscuit mixes. Croutons. Seasoned rice or pasta mixes. Noodle soup cups. Boxed or frozen macaroni and cheese. Regular salted crackers. Self-rising flour. Meats and other proteins Meat or fish that is salted, canned, smoked, spiced, or pickled. Precooked or cured meat, such as sausages or meat loaves. Tomasa Blase. Ham. Pepperoni. Hot dogs. Corned beef. Chipped beef. Salt pork. Jerky. Pickled herring, anchovies, and sardines. Regular canned tuna. Salted nuts. Dairy Processed cheese and cheese spreads. Hard cheeses. Cheese curds. Blue cheese. Feta cheese. String cheese. Regular cottage cheese. Buttermilk. Canned milk. Fats and oils Salted butter. Regular margarine. Ghee. Bacon fat. Seasonings and condiments Onion salt, garlic salt, seasoned salt, table salt, and sea salt. Canned and packaged gravies. Worcestershire sauce. Tartar sauce. Barbecue sauce. Teriyaki sauce. Soy sauce, including reduced-sodium soy sauce. Steak sauce. Fish sauce. Oyster sauce. Cocktail sauce. Horseradish that you find on the shelf. Regular  ketchup and mustard. Meat flavorings and tenderizers. Bouillon cubes. Hot sauce. Pre-made or packaged marinades. Pre-made or packaged taco seasonings. Relishes. Regular salad dressings. Salsa. Other foods Salted popcorn and pretzels. Corn chips and puffs. Potato and tortilla chips. Canned or dried soups. Pizza. Frozen entrees and pot pies. The items listed above may not be all the foods and drinks you should avoid. Talk to a dietitian to learn more. This information is not intended to replace advice given to you by your health care provider. Make sure you discuss any questions you have with your health care provider. Document Revised: 11/01/2022 Document Reviewed: 11/01/2022 Elsevier Patient Education   2024 [employer].

## 2023-06-28 LAB — CBC
Hematocrit: 42.7 % (ref 37.5–51.0)
Hemoglobin: 15 g/dL (ref 13.0–17.7)
MCH: 33.3 pg — ABNORMAL HIGH (ref 26.6–33.0)
MCHC: 35.1 g/dL (ref 31.5–35.7)
MCV: 95 fL (ref 79–97)
Platelets: 164 10*3/uL (ref 150–450)
RBC: 4.5 x10E6/uL (ref 4.14–5.80)
RDW: 13.1 % (ref 11.6–15.4)
WBC: 5.6 10*3/uL (ref 3.4–10.8)

## 2023-06-28 LAB — BASIC METABOLIC PANEL
BUN/Creatinine Ratio: 24 (ref 10–24)
BUN: 27 mg/dL (ref 8–27)
CO2: 22 mmol/L (ref 20–29)
Calcium: 10 mg/dL (ref 8.6–10.2)
Chloride: 98 mmol/L (ref 96–106)
Creatinine, Ser: 1.14 mg/dL (ref 0.76–1.27)
Glucose: 164 mg/dL — ABNORMAL HIGH (ref 70–99)
Potassium: 3.9 mmol/L (ref 3.5–5.2)
Sodium: 138 mmol/L (ref 134–144)
eGFR: 67 mL/min/{1.73_m2} (ref >59)

## 2023-07-02 ENCOUNTER — Telehealth: Payer: Self-pay | Admitting: *Deleted

## 2023-07-02 NOTE — Telephone Encounter (Signed)
Cardiac Catheterization scheduled at [facility] for: Wednesday July 03, 2023 8:30 AM Arrival time [facility] Main Entrance A at: 6:30 AM  Nothing to eat after midnight prior to procedure, clear liquids until 5 AM day of procedure.  Medication instructions: -Hold:  Hydrochlorothiazide-AM of procedure  -Other usual morning medications can be taken with sips of water including aspirin 81 mg.  Plan to go home the same day, you will only stay overnight if medically necessary.  You must have responsible adult to drive you home.  Someone must be with you the first 24 hours after you arrive home.  Reviewed procedure instructions with patient.

## 2023-07-03 ENCOUNTER — Encounter: Payer: Self-pay | Admitting: Physician Assistant

## 2023-07-03 ENCOUNTER — Ambulatory Visit: Payer: Medicare HMO | Admitting: Family Medicine

## 2023-07-03 ENCOUNTER — Ambulatory Visit (HOSPITAL_COMMUNITY)
Admission: RE | Admit: 2023-07-03 | Discharge: 2023-07-03 | Disposition: A | Discharge status: Home / Self Care | Payer: Medicare HMO | Attending: Internal Medicine | Admitting: Internal Medicine

## 2023-07-03 ENCOUNTER — Encounter (HOSPITAL_COMMUNITY): Payer: Self-pay | Admitting: Internal Medicine

## 2023-07-03 ENCOUNTER — Other Ambulatory Visit: Payer: Self-pay | Admitting: Physician Assistant

## 2023-07-03 ENCOUNTER — Encounter (HOSPITAL_COMMUNITY)
Admission: RE | Disposition: A | Discharge status: Home / Self Care | Payer: Self-pay | Source: Home / Self Care | Attending: Internal Medicine

## 2023-07-03 DIAGNOSIS — Z79899 Other long term (current) drug therapy: Secondary | ICD-10-CM | POA: Insufficient documentation

## 2023-07-03 DIAGNOSIS — I10 Essential (primary) hypertension: Secondary | ICD-10-CM | POA: Insufficient documentation

## 2023-07-03 DIAGNOSIS — I350 Nonrheumatic aortic (valve) stenosis: Secondary | ICD-10-CM | POA: Insufficient documentation

## 2023-07-03 DIAGNOSIS — Z7982 Long term (current) use of aspirin: Secondary | ICD-10-CM | POA: Insufficient documentation

## 2023-07-03 DIAGNOSIS — I2584 Coronary atherosclerosis due to calcified coronary lesion: Secondary | ICD-10-CM | POA: Diagnosis not present

## 2023-07-03 DIAGNOSIS — E785 Hyperlipidemia, unspecified: Secondary | ICD-10-CM | POA: Insufficient documentation

## 2023-07-03 DIAGNOSIS — I25119 Atherosclerotic heart disease of native coronary artery with unspecified angina pectoris: Secondary | ICD-10-CM | POA: Insufficient documentation

## 2023-07-03 HISTORY — DX: Nonrheumatic aortic (valve) stenosis: I35.0

## 2023-07-03 HISTORY — PX: RIGHT HEART CATH AND CORONARY ANGIOGRAPHY: CATH118264

## 2023-07-03 LAB — ECHOCARDIOGRAM COMPLETE
AR max vel: 1.23 cm2
AV Area VTI: 1.13 cm2
AV Area mean vel: 1.15 cm2
AV Mean grad: 34 mmHg
AV Peak grad: 57.5 mmHg
Ao pk vel: 3.79 m/s
Area-P 1/2: 4.06 cm2
Est EF: >75
MV VTI: 3.41 cm2
S' Lateral: 2.5 cm

## 2023-07-03 LAB — POCT I-STAT EG7
Acid-base deficit: 5 mmol/L — ABNORMAL HIGH (ref 0.0–2.0)
Bicarbonate: 19.2 mmol/L — ABNORMAL LOW (ref 20.0–28.0)
Calcium, Ion: 0.93 mmol/L — ABNORMAL LOW (ref 1.15–1.40)
HCT: 33 % — ABNORMAL LOW (ref 39.0–52.0)
Hemoglobin: 11.2 g/dL — ABNORMAL LOW (ref 13.0–17.0)
O2 Saturation: 82 %
Potassium: 2.9 mmol/L — ABNORMAL LOW (ref 3.5–5.1)
Sodium: 138 mmol/L (ref 135–145)
TCO2: 20 mmol/L — ABNORMAL LOW (ref 22–32)
pCO2, Ven: 33.7 mmHg — ABNORMAL LOW (ref 44–60)
pH, Ven: 7.365 (ref 7.25–7.43)
pO2, Ven: 48 mmHg — ABNORMAL HIGH (ref 32–45)

## 2023-07-03 LAB — POCT I-STAT 7, (LYTES, BLD GAS, ICA,H+H)
Acid-base deficit: 2 mmol/L (ref 0.0–2.0)
Acid-base deficit: 3 mmol/L — ABNORMAL HIGH (ref 0.0–2.0)
Bicarbonate: 22 mmol/L (ref 20.0–28.0)
Bicarbonate: 22.4 mmol/L (ref 20.0–28.0)
Calcium, Ion: 1.04 mmol/L — ABNORMAL LOW (ref 1.15–1.40)
Calcium, Ion: 1.13 mmol/L — ABNORMAL LOW (ref 1.15–1.40)
HCT: 38 % — ABNORMAL LOW (ref 39.0–52.0)
HCT: 39 % (ref 39.0–52.0)
Hemoglobin: 12.9 g/dL — ABNORMAL LOW (ref 13.0–17.0)
Hemoglobin: 13.3 g/dL (ref 13.0–17.0)
O2 Saturation: 72 %
O2 Saturation: 94 %
Potassium: 3.5 mmol/L (ref 3.5–5.1)
Potassium: 3.8 mmol/L (ref 3.5–5.1)
Sodium: 135 mmol/L (ref 135–145)
Sodium: 139 mmol/L (ref 135–145)
TCO2: 23 mmol/L (ref 22–32)
TCO2: 23 mmol/L (ref 22–32)
pCO2 arterial: 35.2 mmHg (ref 32–48)
pCO2 arterial: 37.5 mmHg (ref 32–48)
pH, Arterial: 7.376 (ref 7.35–7.45)
pH, Arterial: 7.413 (ref 7.35–7.45)
pO2, Arterial: 39 mmHg — CL (ref 83–108)
pO2, Arterial: 71 mmHg — ABNORMAL LOW (ref 83–108)

## 2023-07-03 SURGERY — RIGHT HEART CATH AND CORONARY ANGIOGRAPHY
Anesthesia: LOCAL

## 2023-07-03 MED ORDER — FENTANYL CITRATE (PF) 100 MCG/2ML IJ SOLN
INTRAMUSCULAR | Status: DC | PRN
  Administered 2023-07-03 (×2): 25 ug via INTRAVENOUS

## 2023-07-03 MED ORDER — SODIUM CHLORIDE 0.9 % IV SOLN: 250.0000 mL | INTRAVENOUS | Status: DC | PRN

## 2023-07-03 MED ORDER — LABETALOL HCL 5 MG/ML IV SOLN: 10.0000 mg | INTRAVENOUS | Status: DC | PRN

## 2023-07-03 MED ORDER — VERAPAMIL HCL 2.5 MG/ML IV SOLN
INTRAVENOUS | Status: DC | PRN
  Administered 2023-07-03: 10 mL via INTRA_ARTERIAL

## 2023-07-03 MED ORDER — HEPARIN SODIUM (PORCINE) 1000 UNIT/ML IJ SOLN
INTRAMUSCULAR | Status: DC | PRN
  Administered 2023-07-03: 5000 [IU] via INTRA_ARTERIAL

## 2023-07-03 MED ORDER — FENTANYL CITRATE (PF) 100 MCG/2ML IJ SOLN
INTRAMUSCULAR | Status: AC
  Filled 2023-07-03: qty 2

## 2023-07-03 MED ORDER — HYDRALAZINE HCL 20 MG/ML IJ SOLN: 10.0000 mg | INTRAMUSCULAR | Status: DC | PRN

## 2023-07-03 MED ORDER — VERAPAMIL HCL 2.5 MG/ML IV SOLN
INTRAVENOUS | Status: AC
  Filled 2023-07-03: qty 2

## 2023-07-03 MED ORDER — ASPIRIN 81 MG PO CHEW: 81.0000 mg | CHEWABLE_TABLET | ORAL | Status: DC

## 2023-07-03 MED ORDER — ACETAMINOPHEN 325 MG PO TABS: 650.0000 mg | ORAL_TABLET | ORAL | Status: DC | PRN

## 2023-07-03 MED ORDER — SODIUM CHLORIDE 0.9% FLUSH: 3.0000 mL | Freq: Two times a day (BID) | INTRAVENOUS | Status: DC

## 2023-07-03 MED ORDER — HEPARIN SODIUM (PORCINE) 1000 UNIT/ML IJ SOLN
INTRAMUSCULAR | Status: AC
  Filled 2023-07-03: qty 10

## 2023-07-03 MED ORDER — SODIUM CHLORIDE 0.9 % IV SOLN: INTRAVENOUS | Status: DC

## 2023-07-03 MED ORDER — ONDANSETRON HCL 4 MG/2ML IJ SOLN: 4.0000 mg | Freq: Four times a day (QID) | INTRAMUSCULAR | Status: DC | PRN

## 2023-07-03 MED ORDER — SODIUM CHLORIDE 0.9% FLUSH: 3.0000 mL | INTRAVENOUS | Status: DC | PRN

## 2023-07-03 MED ORDER — SODIUM CHLORIDE 0.9 % WEIGHT BASED INFUSION
3.0000 mL/kg/h | INTRAVENOUS | Status: DC
  Administered 2023-07-03: 3 mL/kg/h via INTRAVENOUS

## 2023-07-03 MED ORDER — MIDAZOLAM HCL 2 MG/2ML IJ SOLN
INTRAMUSCULAR | Status: DC | PRN
  Administered 2023-07-03 (×2): 1 mg via INTRAVENOUS

## 2023-07-03 MED ORDER — LIDOCAINE HCL (PF) 1 % IJ SOLN
INTRAMUSCULAR | Status: DC | PRN
  Administered 2023-07-03 (×2): 2 mL

## 2023-07-03 MED ORDER — HEPARIN (PORCINE) IN NACL 1000-0.9 UT/500ML-% IV SOLN
INTRAVENOUS | Status: DC | PRN
  Administered 2023-07-03 (×2): 500 mL

## 2023-07-03 MED ORDER — LIDOCAINE HCL (PF) 1 % IJ SOLN
INTRAMUSCULAR | Status: AC
  Filled 2023-07-03: qty 30

## 2023-07-03 MED ORDER — SODIUM CHLORIDE 0.9 % WEIGHT BASED INFUSION: 1.0000 mL/kg/h | INTRAVENOUS | Status: DC

## 2023-07-03 MED ORDER — IOHEXOL 350 MG/ML SOLN
INTRAVENOUS | Status: DC | PRN
  Administered 2023-07-03: 105 mL

## 2023-07-03 MED ORDER — MIDAZOLAM HCL 2 MG/2ML IJ SOLN
INTRAMUSCULAR | Status: AC
  Filled 2023-07-03: qty 2

## 2023-07-03 SURGICAL SUPPLY — 17 items
BALLN EMERGE MR 2.0X12 (BALLOONS) ×1
BALLOON EMERGE MR 2.0X12 (BALLOONS) IMPLANT
CATH BALLN WEDGE 5F 110CM (CATHETERS) IMPLANT
CATH INFINITI 5FR ANG PIGTAIL (CATHETERS) IMPLANT
CATH INFINITI AMBI 6FR TG (CATHETERS) IMPLANT
CATH LAUNCHER 5F JR4 (CATHETERS) IMPLANT
DEVICE RAD COMP TR BAND LRG (VASCULAR PRODUCTS) IMPLANT
GLIDESHEATH SLEND SS 6F .021 (SHEATH) IMPLANT
GUIDEWIRE .025 260CM (WIRE) IMPLANT
KIT ESSENTIALS PG (KITS) IMPLANT
KIT SYRINGE INJ CVI SPIKEX1 (MISCELLANEOUS) IMPLANT
PACK CARDIAC CATHETERIZATION (CUSTOM PROCEDURE TRAY) ×2 IMPLANT
SET ATX-X65L (MISCELLANEOUS) IMPLANT
SHEATH GLIDE SLENDER 4/5FR (SHEATH) IMPLANT
WIRE ASAHI PROWATER 300CM (WIRE) IMPLANT
WIRE EMERALD 3MM-J .035X260CM (WIRE) IMPLANT
WIRE HI TORQ VERSACORE-J 145CM (WIRE) IMPLANT

## 2023-07-03 NOTE — Discharge Instructions (Signed)

## 2023-07-03 NOTE — Interval H&P Note (Signed)
History and Physical Interval Note:  07/03/2023 6:51 AM  [name]  has presented today for surgery, with the diagnosis of chest pain.  The various methods of treatment have been discussed with the patient and family. After consideration of risks, benefits and other options for treatment, the patient has consented to  Procedure(s): RIGHT/LEFT HEART CATH AND CORONARY ANGIOGRAPHY (N/A) as a surgical intervention.  The patient's history has been reviewed, patient examined, no change in status, stable for surgery.  I have reviewed the patient's chart and labs.  Questions were answered to the patient's satisfaction.     [name]

## 2023-07-04 ENCOUNTER — Encounter (HOSPITAL_COMMUNITY): Payer: Self-pay | Admitting: Internal Medicine

## 2023-07-04 ENCOUNTER — Other Ambulatory Visit: Payer: Self-pay | Admitting: Physician Assistant

## 2023-07-04 ENCOUNTER — Telehealth: Payer: Self-pay

## 2023-07-04 MED ORDER — METOPROLOL TARTRATE 50 MG PO TABS: 50.0000 mg | ORAL_TABLET | ORAL | 0 refills | Status: DC

## 2023-07-04 NOTE — Telephone Encounter (Signed)
Called to review Echo results with patient. Patient verbalizes understanding of diastolic dysfunction and severe aortic stenosis. Patient had cath 07/03/23.  CT scheduled for 07/09/23. Appt with Dr. Lynnette Caffey 07/10/23, appt with Dr. Laneta Simmers 07/31/23.

## 2023-07-04 NOTE — Addendum Note (Signed)
Addended by: Burnetta Sabin on: 07/04/2023 12:55 PM   Modules accepted: Orders

## 2023-07-04 NOTE — Progress Notes (Unsigned)
Patient ID: [name] MRN: [id number] DOB/AGE: 03/17/1946 77 y.o.  Primary Care Physician:Tower, Audrie Gallus, MD Primary Cardiologist: Armanda Magic, MD  FOCUSED CARDIOVASCULAR PROBLEM LIST:   1.  Moderate aortic stenosis with an aortic valve area of 1.13 cm2 and a mean gradient 34 mmHg ejection fraction of 60-65%; EKG without conduction deficits 2.  Hypertension 3.  Hyperlipidemia 4.  BMI of 33 5.  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  CKD stage II  HISTORY OF PRESENT ILLNESS: The patient is a 77 y.o. male with the indicated medical history here for recommendations regarding ongoing dyspnea and chest discomfort.  Patient was seen by general cardiology recently.  He had reported increasing shortness of breath and chest discomfort starting over the summer.  He is referred for coronary CTA which suggested a significant stenosis in the mid to distal left anterior descending artery.  An echocardiogram demonstrated moderate aortic stenosis with a calcified aortic valve with diminished mobility.  He underwent cardiac catheterization which demonstrated moderate obstructive coronary artery disease of the proximal LAD.  His cardiac output and index were preserved with relatively normal filling pressures.  Past Medical History:  Diagnosis Date   Arthritis    LEFT knee   Back pain    Diverticulosis    History of colon polyps    Hyperlipidemia    on meds   Hypertension    on meds   Severe aortic stenosis    Thyroid disease    on meds    Past Surgical History:  Procedure Laterality Date   COLONOSCOPY  2018   SA-MAC-suprep-ageq with lavage-tics/int hems/TA x 5 frags   INGUINAL HERNIA REPAIR Bilateral 02/13/2017   Procedure: LAPAROSCOPIC BILATERAL INGUINAL HERNIA REPAIR WITH MESH;  Surgeon: Abigail Miyamoto, MD;  Location: [facility] OR;  Service: General;  Laterality: Bilateral;   LASIK     POLYPECTOMY  2018   TA x 5   RIGHT HEART CATH AND CORONARY  ANGIOGRAPHY N/A 07/03/2023   Procedure: RIGHT HEART CATH AND CORONARY ANGIOGRAPHY;  Surgeon: [name], MD;  Location: [facility] INVASIVE [facility];  Service: Cardiovascular;  Laterality: N/A;   UMBILICAL HERNIA REPAIR N/A 02/13/2017   Procedure: LAPAROSCOPIC UMBILICAL HERNIA REPAIR;  Surgeon: Abigail Miyamoto, MD;  Location: [facility] OR;  Service: General;  Laterality: N/A;   WISDOM TOOTH EXTRACTION      Family History  Problem Relation Age of Onset   Cancer Mother        brain tumor   Emphysema Father    Diabetes Father    HIV Brother    Colon cancer Neg Hx    Colon polyps Neg Hx    Esophageal cancer Neg Hx    Rectal cancer Neg Hx    Stomach cancer Neg Hx     Social History   Socioeconomic History   Marital status: Married    Spouse name: Not on file   Number of children: 1   Years of education: Not on file   Highest education level: Associate degree: occupational, Scientist, product/process development, or vocational program  Occupational History    Employer: [employer]  Tobacco Use   Smoking status: Never   Smokeless tobacco: Never  Vaping Use   Vaping status: Never Used  Substance and Sexual Activity   Alcohol use: Yes    Alcohol/week: 21.0 - 28.0 standard drinks of alcohol    Types: 21 - 28 Cans of beer per week    Comment: 3-4 cans of beer daily  Drug use: No   Sexual activity: Yes  Other Topics Concern   Not on file  Social History Narrative   Not on file   Social Determinants of Health   Financial Resource Strain: Low Risk  (07/04/2023)   Overall Financial Resource Strain (CARDIA)    Difficulty of Paying Living Expenses: Not hard at all  Food Insecurity: No Food Insecurity (07/04/2023)   Hunger Vital Sign    Worried About Running Out of Food in the Last Year: Never true    Ran Out of Food in the Last Year: Never true  Transportation Needs: No Transportation Needs (07/04/2023)   PRAPARE - Administrator, Civil Service (Medical): No    Lack of Transportation (Non-Medical): No   Physical Activity: Sufficiently Active (07/04/2023)   Exercise Vital Sign    Days of Exercise per Week: 5 days    Minutes of Exercise per Session: 60 min  Stress: Not on file  Social Connections: Moderately Isolated (07/04/2023)   Social Connection and Isolation Panel [NHANES]    Frequency of Communication with Friends and Family: Never    Frequency of Social Gatherings with Friends and Family: More than three times a week    Attends Religious Services: Never    Database administrator or Organizations: No    Attends Engineer, structural: Not on file    Marital Status: Married  Catering manager Violence: Not on file     Prior to Admission medications   Medication Sig Start Date End Date Taking? Authorizing Provider  amLODipine (NORVASC) 10 MG tablet Take 1 tablet (10 mg total) by mouth daily. Patient taking differently: Take 10 mg by mouth every evening. 06/13/23   Tower, Audrie Gallus, MD  aspirin EC 81 MG tablet Take 1 tablet (81 mg total) by mouth daily. Swallow whole. 06/24/23   Quintella Reichert, MD  DOXYLAMINE SUCCINATE, SLEEP, PO Take 50 mg by mouth at bedtime.    [provider]  hydrochlorothiazide (HYDRODIURIL) 25 MG tablet Take 1 tablet (25 mg total) by mouth daily. 06/20/23   Tower, Audrie Gallus, MD  levothyroxine (SYNTHROID) 25 MCG tablet TAKE 1 TABLET BY MOUTH DAILY BEFORE BREAKFAST. 04/19/23   Tower, Audrie Gallus, MD  metoprolol tartrate (LOPRESSOR) 50 MG tablet Take 1 tablet (50 mg total) by mouth as directed for 1 dose. Take 2 hours prior to CT scans to slow heart rate for study 07/04/23   Janetta Hora, PA-C  rosuvastatin (CRESTOR) 20 MG tablet Take 20 mg by mouth every evening.    [provider]    Allergies  Allergen Reactions   Ezetimibe-Simvastatin     REACTION: back pain    REVIEW OF SYSTEMS:  General: no fevers/chills/night sweats Eyes: no blurry vision, diplopia, or amaurosis ENT: no sore throat or hearing loss Resp: no cough, wheezing, or  hemoptysis CV: no edema or palpitations GI: no abdominal pain, nausea, vomiting, diarrhea, or constipation GU: no dysuria, frequency, or hematuria Skin: no rash Neuro: no headache, numbness, tingling, or weakness of extremities Musculoskeletal: no joint pain or swelling Heme: no bleeding, DVT, or easy bruising Endo: no polydipsia or polyuria  There were no vitals taken for this visit.  PHYSICAL EXAM: GEN:  AO x 3 in no acute distress HEENT: normal Dentition: Normal*** Neck: JVP normal. +2***carotid upstrokes without bruits. No thyromegaly. Lungs: equal expansion, clear bilaterally CV: Apex is discrete and nondisplaced, RRR without murmur or gallop*** Abd: soft, non-tender, non-distended; no bruit; positive bowel  sounds Ext: no edema, ecchymoses, or cyanosis Vascular: 2+ femoral pulses, 2+ radial pulses       Skin: warm and dry without rash Neuro: CN II-XII grossly intact; motor and sensory grossly intact    DATA AND STUDIES:  EKG:     EKG done August 2024 demonstrates sinus tachycardia and PVCs  Cardiac Studies & Procedures   CARDIAC CATHETERIZATION  CARDIAC CATHETERIZATION 07/03/2023  Narrative   Dist RCA lesion is 40% stenosed.   Prox LAD lesion is 65% stenosed.   Lat 1st Diag lesion is 80% stenosed.  1.  Moderate calcified proximal LAD lesion with mild diffuse disease elsewhere. 2.  Separate ostia of LAD and left circumflex arteries. 3.  Fick cardiac output of 11.3 L/min and Fick cardiac index of 5.4 L/min/m with the following hemodynamics: RA pressure mean of 8 mmHg RV pressure 41/3 with an end-diastolic pressure of 10 mmHg Wedge pressure mean of 12 mmHg with V waves to 18 mmHg PA pressure 42/9 with a mean of 23 mmHg Pulmonary vascular resistance of 1 Wood unit Pulmonary artery pulsatility index of 4 4.  Capacious iliofemoral vessels bilaterally. 5.  Right radial arterial loop requiring coronary guidewire to navigate.  Recommendation: Continue evaluation for  management of concomitant aortic stenosis and coronary artery disease.  Findings Coronary Findings Diagnostic  Dominance: Right  Left Anterior Descending Prox LAD lesion is 65% stenosed.  Lateral First Diagonal Branch Lat 1st Diag lesion is 80% stenosed.  Ramus Intermedius There is mild diffuse disease throughout the vessel.  Left Circumflex There is mild diffuse disease throughout the vessel.  Right Coronary Artery There is mild diffuse disease throughout the vessel. Dist RCA lesion is 40% stenosed.  Intervention  No interventions have been documented.     ECHOCARDIOGRAM  ECHOCARDIOGRAM COMPLETE 06/27/2023  Narrative ECHOCARDIOGRAM REPORT    Patient Name:   Omer Anacker Date of Exam: 06/27/2023 Medical Rec #:  [id number]        Height:       67.5 in Accession #:    [id number]       Weight:       216.0 lb Date of Birth:  [birth year]       BSA:          2.101 m Patient Age:    76 years         BP:           170/92 mmHg Patient Gender: M                HR:           91 bpm. Exam Location:  [address]  Procedure: 2D Echo, 3D Echo, Cardiac Doppler, Color Doppler and Strain Analysis  MODIFIED REPORT: This report was modified by Olga Millers MD on 07/03/2023 due to change. Indications:     R01.1 Murmur  History:         Patient has prior history of Echocardiogram examinations, most recent 02/03/2014. Signs/Symptoms:Chest tightness, 2/6 systolic murmur at RUSB to LLSB and Shortness of Breath; Risk Factors:Hypertension and Dyslipidemia. Previous echo normal LV function and aortic sclerosis with mean gradient of 13 mmHg.  Sonographer:     Chanetta Marshall [facility], RDCS Referring Phys:  [id number] Tanna Savoy [facility] Diagnosing Phys: Olga Millers MD  IMPRESSIONS   1. Left ventricular ejection fraction, by estimation, is >75%. The left ventricle has hyperdynamic function. The left ventricle has no regional wall motion abnormalities. There is moderate concentric left  ventricular hypertrophy. Left  ventricular diastolic parameters are consistent with Grade I diastolic dysfunction (impaired relaxation). Elevated left atrial pressure. The average left ventricular global longitudinal strain is -16.6 %. The global longitudinal strain is normal. 2. Right ventricular systolic function is normal. The right ventricular size is normal. 3. The mitral valve is normal in structure. Trivial mitral valve regurgitation. No evidence of mitral stenosis. Moderate mitral annular calcification. 4. The aortic valve is calcified. Aortic valve regurgitation is not visualized. Moderate aortic valve stenosis. 5. The inferior vena cava is normal in size with greater than 50% respiratory variability, suggesting right atrial pressure of 3 mmHg.  Comparison(s): No prior Echocardiogram.  FINDINGS Left Ventricle: Left ventricular ejection fraction, by estimation, is >75%. The left ventricle has hyperdynamic function. The left ventricle has no regional wall motion abnormalities. The average left ventricular global longitudinal strain is -16.6 %. The global longitudinal strain is normal. The left ventricular internal cavity size was normal in size. There is moderate concentric left ventricular hypertrophy. Left ventricular diastolic parameters are consistent with Grade I diastolic dysfunction (impaired relaxation). Elevated left atrial pressure.  Right Ventricle: The right ventricular size is normal. Right ventricular systolic function is normal.  Left Atrium: Left atrial size was normal in size.  Right Atrium: Right atrial size was normal in size.  Pericardium: There is no evidence of pericardial effusion.  Mitral Valve: The mitral valve is normal in structure. Moderate mitral annular calcification. Trivial mitral valve regurgitation. No evidence of mitral valve stenosis. MV peak gradient, 6.7 mmHg. The mean mitral valve gradient is 4.0 mmHg.  Tricuspid Valve: The tricuspid valve is  normal in structure. Tricuspid valve regurgitation is trivial. No evidence of tricuspid stenosis.  Aortic Valve: The aortic valve is calcified. Aortic valve regurgitation is not visualized. Moderate aortic stenosis is present. Aortic valve mean gradient measures 34.0 mmHg. Aortic valve peak gradient measures 57.5 mmHg. Aortic valve area, by VTI measures 1.13 cm.  Pulmonic Valve: The pulmonic valve was normal in structure. Pulmonic valve regurgitation is not visualized. No evidence of pulmonic stenosis.  Aorta: The aortic root is normal in size and structure.  Venous: The inferior vena cava is normal in size with greater than 50% respiratory variability, suggesting right atrial pressure of 3 mmHg.  IAS/Shunts: No atrial level shunt detected by color flow Doppler.   LEFT VENTRICLE PLAX 2D LVIDd:         3.70 cm   Diastology LVIDs:         2.50 cm   LV e' medial:    6.31 cm/s LV PW:         1.40 cm   LV E/e' medial:  18.4 LV IVS:        1.50 cm   LV e' lateral:   6.42 cm/s LVOT diam:     2.10 cm   LV E/e' lateral: 18.1 LV SV:         90 LV SV Index:   43        2D Longitudinal Strain LVOT Area:     3.46 cm  2D Strain GLS (A2C):   -16.3 % 2D Strain GLS (A3C):   -16.4 % 2D Strain GLS (A4C):   -17.1 % 2D Strain GLS Avg:     -16.6 %  3D Volume EF: 3D EF:        63 % LV EDV:       154 ml LV ESV:       57 ml LV SV:  98 ml  RIGHT VENTRICLE RV Basal diam:  3.70 cm RV S prime:     22.40 cm/s TAPSE (M-mode): 2.6 cm  LEFT ATRIUM             Index        RIGHT ATRIUM           Index LA diam:        4.70 cm 2.24 cm/m   RA Area:     11.70 cm LA Vol (A2C):   56.8 ml 27.03 ml/m  RA Volume:   30.20 ml  14.37 ml/m LA Vol (A4C):   63.8 ml 30.37 ml/m LA Biplane Vol: 61.2 ml 29.13 ml/m AORTIC VALVE AV Area (Vmax):    1.23 cm AV Area (Vmean):   1.15 cm AV Area (VTI):     1.13 cm AV Vmax:           379.00 cm/s AV Vmean:          280.000 cm/s AV VTI:            0.796 m AV  Peak Grad:      57.5 mmHg AV Mean Grad:      34.0 mmHg LVOT Vmax:         135.00 cm/s LVOT Vmean:        92.800 cm/s LVOT VTI:          0.259 m LVOT/AV VTI ratio: 0.33  AORTA Ao Root diam: 2.70 cm Ao Asc diam:  3.30 cm  MITRAL VALVE MV Area (PHT): 4.06 cm     SHUNTS MV Area VTI:   3.41 cm     Systemic VTI:  0.26 m MV Peak grad:  6.7 mmHg     Systemic Diam: 2.10 cm MV Mean grad:  4.0 mmHg MV Vmax:       1.29 m/s MV Vmean:      99.3 cm/s MV Decel Time: 187 msec MV E velocity: 116.00 cm/s MV A velocity: 135.00 cm/s MV E/A ratio:  0.86  Olga Millers MD Electronically signed by Olga Millers MD Signature Date/Time: 06/27/2023/11:57:54 AM    Final (Updated)     CT SCANS  CT CORONARY MORPH W/CTA COR W/SCORE 06/11/2023  Addendum 06/19/2023  2:49 PM ADDENDUM REPORT: 06/19/2023 14:46  EXAM: OVER-READ INTERPRETATION  CT CHEST  The following report is an over-read performed by radiologist Dr. Curly Shores [facility] on 06/19/2023. This over-read does not include interpretation of cardiac or coronary anatomy or pathology. The coronary CTA interpretation by the cardiologist is attached.  COMPARISON:  None.  FINDINGS: Cardiovascular:  Findings discussed in the body of the report.  Mediastinum/Nodes: No suspicious adenopathy identified. Imaged mediastinal structures are unremarkable.  Lungs/Pleura: There is dependent basilar subsegmental atelectasis. No pneumonia or pulmonary edema. No pleural effusion or pneumothorax.  Upper Abdomen: No acute abnormality.  Musculoskeletal: There is mild bilateral gynecomastia. There are thoracic degenerative changes.  IMPRESSION: Mild bilateral gynecomastia. Otherwise no significant extracardiac incidental findings identified.   Electronically Signed By: Layla Maw M.D. On: 06/19/2023 14:46  Narrative CLINICAL DATA:  This is 77 year old male with anginal symptoms.  EXAM: Cardiac/Coronary   CTA  TECHNIQUE: The patient was scanned on a [employer].  FINDINGS: A 100 kV prospective scan was triggered in the descending thoracic aorta at 111 HU's. Axial non-contrast 3 mm slices were carried out through the heart. The data set was analyzed on a dedicated work station and scored using the Agatson method.  Gantry rotation speed was 250 msecs and collimation was .6 mm. No beta blockade and 0.8 mg of sl NTG was given. The 3D data set was reconstructed in 5% intervals of the 67-82 % of the R-R cycle. Diastolic phases were analyzed on a dedicated work station using MPR, MIP and VRT modes. The patient received 80 cc of contrast.  Image Quality: Fair with misregistration artifact.  Aorta: Normal size. Mild aortic root calcifications. No dissection.  Aortic Valve: Trileaflet. Moderate calcifications (Agatston score 4650).  Coronary Arteries:  Normal coronary origin.  Right dominance.  RCA is a large dominant artery that gives rise to PDA and PLA. The proximal RCA is not well visualized due to artifact. The mid and distal RCA minimal (<24%) focal calcifications.  Short left main artery that gives rise to LAD and LCX arteries. There is minimal calcification in the distal portion of the vessel.  LAD is a large vessel. Diffuse long calcified plaque unable to accurately quantified due to blooming artifact but suspect it is moderate (50-69%) throughout the proximal, mid LAD extending into the first portion of the distal LAD. D1 is small caliber vessel with a focal mild (24-59%) calcified plaque. D2 is a large caliber vessel with moderate calcified plaque in the proximal portion of the vessel then split off to terminate into two branches. The branch on the right with moderate plaque which tapers of as mild. The branch on the right with minimal focal calcified plaque.  LCX is a non-dominant artery that gives rise to one large OM1 branch. The LCX with moderate calcification  throughout the proximal to mid LCX. The distal LCX with diffuse mild calcifications.  Coronary Calcium Score:  Left main: 52  Left anterior descending artery: 1819  Left circumflex artery: 952  Right coronary artery: 847  Total: 3670  Percentile: 96  Other findings:  Normal pulmonary vein drainage into the left atrium.  Normal left atrial appendage without a thrombus.  Normal size of the pulmonary artery.  Moderate mitral annular calcification.  IMPRESSION: 1. Coronary calcium score of 3670. This was 61 percentile for age and sex matched control.  2. Normal coronary origin with right dominance.  3. CAD-RADS 3. Moderate stenosis. Consider symptom-guided anti-ischemic pharmacotherapy as well as risk factor modification per guideline directed care. Additional analysis with CT FFR will be submitted.  4.  Aortic valve calcification (Agatston score 4650).  5.  Moderate mitral annular calcification.  The noncardiac portion of this study will be interpreted in separate report by the radiologist.  Electronically Signed: By: Thomasene Ripple D.O. On: 06/11/2023 15:17          03/15/2023: TSH 2.67 05/27/2023: ALT 27 06/27/2023: BUN 27; Creatinine, Ser 1.14; Platelets 164 07/03/2023: Hemoglobin 12.9; Potassium 3.5; Sodium 135   STS RISK CALCULATOR:   Procedure Type: Isolated AVR Perioperative Outcome Estimate % Operative Mortality 1.4% Morbidity & Mortality 6.77% Stroke 0.896% Renal Failure 1.06% Reoperation 3.85% Prolonged Ventilation 3.11% Deep Sternal Wound Infection 0.064% Long Hospital Stay (>14 days) 2.43% Short Hospital Stay (<6 days)* 59%   NHYA CLASS: 2  CCS CLASS: 1  Euroscore II: 0.75%   ASSESSMENT AND PLAN:   Moderate aortic stenosis  Coronary artery disease involving native coronary artery of native heart without angina pectoris  Hyperlipidemia LDL goal <70  Primary hypertension  CKD (chronic kidney disease) stage 2, GFR 60-89  ml/min  BMI 31.0-31.9,adult   I have personally reviewed the patients imaging data as summarized above.  I have reviewed the  natural history of aortic stenosis with the patient and family members who are present today. We have discussed the limitations of medical therapy and the poor prognosis associated with symptomatic aortic stenosis. We have also reviewed potential treatment options, including palliative medical therapy, conventional surgical aortic valve replacement, and transcatheter aortic valve replacement. We discussed treatment options in the context of this patient's specific comorbid medical conditions.   All of the patient's questions were answered today. Will make further recommendations based on the results of studies outlined above.   Total time spent with patient today *** minutes. This includes reviewing records, evaluating the patient and coordinating care.   [name], MD  07/04/2023 4:18 PM    [facility] [address] [place], [place], [place]  [address] Phone: [phone]; Fax: [phone]

## 2023-07-04 NOTE — Telephone Encounter (Signed)
-----   Message from Armanda Magic sent at 07/02/2023  8:10 PM EDT ----- Echo with EF >75% with moderately thickened heart muscle and increased stiffness of the heart muscle called diastolic dysfunction, severe aortic stenosis>>make sure he gets in with Jari Favre, NP for left and right heart cath and referral to structural heart team

## 2023-07-05 ENCOUNTER — Ambulatory Visit (INDEPENDENT_AMBULATORY_CARE_PROVIDER_SITE_OTHER): Payer: Medicare HMO | Admitting: Family Medicine

## 2023-07-05 ENCOUNTER — Encounter: Payer: Self-pay | Admitting: Family Medicine

## 2023-07-05 VITALS — BP 150/72 | HR 84 | Temp 97.8°F | Ht 68.0 in | Wt 216.1 lb

## 2023-07-05 DIAGNOSIS — E7800 Pure hypercholesterolemia, unspecified: Secondary | ICD-10-CM

## 2023-07-05 DIAGNOSIS — E875 Hyperkalemia: Secondary | ICD-10-CM

## 2023-07-05 DIAGNOSIS — F1090 Alcohol use, unspecified, uncomplicated: Secondary | ICD-10-CM

## 2023-07-05 DIAGNOSIS — I350 Nonrheumatic aortic (valve) stenosis: Secondary | ICD-10-CM | POA: Insufficient documentation

## 2023-07-05 DIAGNOSIS — I10 Essential (primary) hypertension: Secondary | ICD-10-CM | POA: Diagnosis not present

## 2023-07-05 DIAGNOSIS — R7303 Prediabetes: Secondary | ICD-10-CM

## 2023-07-05 DIAGNOSIS — I2510 Atherosclerotic heart disease of native coronary artery without angina pectoris: Secondary | ICD-10-CM | POA: Insufficient documentation

## 2023-07-05 LAB — BASIC METABOLIC PANEL
BUN: 20 mg/dL (ref 6–23)
CO2: 28 meq/L (ref 19–32)
Calcium: 10.3 mg/dL (ref 8.4–10.5)
Chloride: 100 meq/L (ref 96–112)
Creatinine, Ser: 0.9 mg/dL (ref 0.40–1.50)
GFR: 83.92 mL/min (ref >60.00)
Glucose, Bld: 116 mg/dL — ABNORMAL HIGH (ref 70–99)
Potassium: 4.5 meq/L (ref 3.5–5.1)
Sodium: 138 mEq/L (ref 135–145)

## 2023-07-05 LAB — HEMOGLOBIN A1C: Hgb A1c MFr Bld: 5.8 % (ref 4.6–6.5)

## 2023-07-05 MED ORDER — ROSUVASTATIN CALCIUM 40 MG PO TABS: 40.0000 mg | ORAL_TABLET | Freq: Every day | ORAL | 2 refills | Status: DC

## 2023-07-05 NOTE — Progress Notes (Unsigned)
Subjective:    Patient ID: [name], male    DOB: 05/28/1946, 77 y.o.   MRN: [id number]  HPI  Wt Readings from Last 3 Encounters:  07/05/23 216 lb 2 oz (98 kg)  07/03/23 210 lb (95.3 kg)  06/27/23 216 lb 9.6 oz (98.2 kg)   32.86 kg/m  Vitals:   07/05/23 0800 07/05/23 0829  BP: (!) 174/82 (!) 150/72  Pulse: 84   Temp: 97.8 F (36.6 C)   SpO2: 98%    Pt presents for follow up of HTN and cardiac issues   HTN bp is stable today  No cp or palpitations or headaches or edema  No side effects to medicines  BP Readings from Last 3 Encounters:  07/05/23 (!) 150/72  07/03/23 131/72  06/27/23 (!) 144/80    Pulse Readings from Last 3 Encounters:  07/05/23 84  07/03/23 90  06/27/23 (!) 106   Hydrochlorothiazide 25 mg daily  Amllodipine 10 mg daily  Had high K from his ace and that was changed to hydrochlorothiazide   Blood pressure per pt during cath was in 130s systolic   At home-his cuff may be off - higher    Lab Results  Component Value Date   NA 138 07/05/2023   K 4.5 07/05/2023   CO2 28 07/05/2023   GLUCOSE 116 (H) 07/05/2023   BUN 20 07/05/2023   CREATININE 0.90 07/05/2023   CALCIUM 10.3 07/05/2023   GFR 83.92 07/05/2023   EGFR 67 06/27/2023   GFRNONAA >60 02/13/2017   Most recent K was normal   Lab Results  Component Value Date   HGBA1C 5.8 07/05/2023     Saw cardiology for symptoms of chest pressure  Found to have DD on echo with EF over 75 % and severe aortic stenosis   Right heart cath    Dist RCA lesion is 40% stenosed.   Prox LAD lesion is 65% stenosed.   Lat 1st Diag lesion is 80% stenosed.   1.  Moderate calcified proximal LAD lesion with mild diffuse disease elsewhere. 2.  Separate ostia of LAD and left circumflex arteries. 3.  Fick cardiac output of 11.3 L/min and Fick cardiac index of 5.4 L/min/m with the following hemodynamics:             RA pressure mean of 8 mmHg             RV pressure 41/3 with an end-diastolic  pressure of 10 mmHg             Wedge pressure mean of 12 mmHg with V waves to 18 mmHg             PA pressure 42/9 with a mean of 23 mmHg             Pulmonary vascular resistance of 1 Wood unit             Pulmonary artery pulsatility index of 4 4.  Capacious iliofemoral vessels bilaterally.   5.  Right radial arterial loop requiring coronary guidewire to navigate.   Recommendation: Continue evaluation for management of concomitant aortic stenosis and coronary artery disease.  Hyperlipidemia Lab Results  Component Value Date   CHOL 247 (H) 05/27/2023   HDL 82.90 05/27/2023   LDLCALC 143 (H) 05/27/2023   LDLDIRECT 130.0 01/06/2013   TRIG 104.0 05/27/2023   CHOLHDL 3 05/27/2023   Atorvastatin 40 mg was changed to crestor 40  mg by cardiology   Is eating differently  No fried food  Less red meat  Eating fish and salad   Pace with walking has slowed down  No cp if he does not push it too hard No swollen ankles  No shortness of breath   Will be seeing thoracic surgeon for the valve    Patient Active Problem List   Diagnosis Date Noted   Aortic stenosis 07/05/2023   CAD (coronary artery disease) 07/05/2023   Chest pressure 05/27/2023   Urinary hesitancy 03/22/2023   Hyperkalemia 03/22/2023   Hypothyroid 03/08/2021   History of colon polyps 03/08/2021   Alcohol intake above recommended sensible limits 05/13/2019   Hyperlipidemia 02/25/2019   Colon cancer screening 02/12/2017   Routine general medical examination at a health care facility 01/20/2016   Heart murmur, systolic 01/13/2014   Elevated PSA 01/13/2014   Encounter for Medicare annual wellness exam 09/24/2011   Prostate cancer screening 09/24/2011   Osteoarthritis, knee 02/14/2011   TINNITUS 12/09/2009   BACK PAIN, LUMBAR, WITH RADICULOPATHY 10/10/2009   Essential hypertension, benign 08/16/2009   Thrombocytopenia (HCC) 02/15/2009   Prediabetes 01/07/2008   Past Medical History:  Diagnosis Date    Arthritis    LEFT knee   Back pain    Diverticulosis    History of colon polyps    Hyperlipidemia    on meds   Hypertension    on meds   Severe aortic stenosis    Thyroid disease    on meds   Past Surgical History:  Procedure Laterality Date   COLONOSCOPY  2018   SA-MAC-suprep-ageq with lavage-tics/int hems/TA x 5 frags   INGUINAL HERNIA REPAIR Bilateral 02/13/2017   Procedure: LAPAROSCOPIC BILATERAL INGUINAL HERNIA REPAIR WITH MESH;  Surgeon: Abigail Miyamoto, MD;  Location: [facility] OR;  Service: General;  Laterality: Bilateral;   LASIK     POLYPECTOMY  2018   TA x 5   RIGHT HEART CATH AND CORONARY ANGIOGRAPHY N/A 07/03/2023   Procedure: RIGHT HEART CATH AND CORONARY ANGIOGRAPHY;  Surgeon: [name], MD;  Location: [facility];  Service: Cardiovascular;  Laterality: N/A;   UMBILICAL HERNIA REPAIR N/A 02/13/2017   Procedure: LAPAROSCOPIC UMBILICAL HERNIA REPAIR;  Surgeon: Abigail Miyamoto, MD;  Location: [facility] OR;  Service: General;  Laterality: N/A;   WISDOM TOOTH EXTRACTION     Social History   Tobacco Use   Smoking status: Never   Smokeless tobacco: Never  Vaping Use   Vaping status: Never Used  Substance Use Topics   Alcohol use: Yes    Alcohol/week: 21.0 - 28.0 standard drinks of alcohol    Types: 21 - 28 Cans of beer per week    Comment: 3-4 cans of beer daily   Drug use: No   Family History  Problem Relation Age of Onset   Cancer Mother        brain tumor   Emphysema Father    Diabetes Father    HIV Brother    Colon cancer Neg Hx    Colon polyps Neg Hx    Esophageal cancer Neg Hx    Rectal cancer Neg Hx    Stomach cancer Neg Hx    Allergies  Allergen Reactions   Ezetimibe-Simvastatin     REACTION: back pain   Current Outpatient Medications on File Prior to Visit  Medication Sig Dispense Refill   amLODipine (NORVASC) 10 MG tablet Take 1 tablet (10 mg total) by mouth daily. (Patient taking differently: Take 10 mg by mouth every evening.) 90  tablet  0   aspirin EC 81 MG tablet Take 1 tablet (81 mg total) by mouth daily. Swallow whole. 90 tablet 3   DOXYLAMINE SUCCINATE, SLEEP, PO Take 50 mg by mouth at bedtime.     hydrochlorothiazide (HYDRODIURIL) 25 MG tablet Take 1 tablet (25 mg total) by mouth daily. 30 tablet 0   levothyroxine (SYNTHROID) 25 MCG tablet TAKE 1 TABLET BY MOUTH DAILY BEFORE BREAKFAST. 90 tablet 2   metoprolol tartrate (LOPRESSOR) 50 MG tablet Take 1 tablet (50 mg total) by mouth as directed for 1 dose. Take 2 hours prior to CT scans to slow heart rate for study 1 tablet 0   No current facility-administered medications on file prior to visit.    Review of Systems  Constitutional:  Positive for activity change. Negative for appetite change, fatigue, fever and unexpected weight change.  HENT:  Negative for congestion, rhinorrhea, sore throat and trouble swallowing.   Eyes:  Negative for pain, redness, itching and visual disturbance.  Respiratory:  Negative for cough, chest tightness, shortness of breath and wheezing.   Cardiovascular:  Negative for chest pain and palpitations.  Gastrointestinal:  Negative for abdominal pain, blood in stool, constipation, diarrhea and nausea.  Endocrine: Negative for cold intolerance, heat intolerance, polydipsia and polyuria.  Genitourinary:  Negative for difficulty urinating, dysuria, frequency and urgency.  Musculoskeletal:  Negative for arthralgias, joint swelling and myalgias.  Skin:  Negative for pallor and rash.  Neurological:  Negative for dizziness, tremors, weakness, numbness and headaches.  Hematological:  Negative for adenopathy. Does not bruise/bleed easily.  Psychiatric/Behavioral:  Negative for decreased concentration and dysphoric mood. The patient is not nervous/anxious.        Objective:   Physical Exam Constitutional:      General: He is not in acute distress.    Appearance: He is well-developed.  HENT:     Head: Normocephalic and atraumatic.      Mouth/Throat:     Mouth: Mucous membranes are moist.  Eyes:     Conjunctiva/sclera: Conjunctivae normal.     Pupils: Pupils are equal, round, and reactive to light.  Neck:     Thyroid: No thyromegaly.     Vascular: No carotid bruit or JVD.  Cardiovascular:     Rate and Rhythm: Normal rate and regular rhythm.     Heart sounds: Murmur heard.     No gallop.  Pulmonary:     Effort: Pulmonary effort is normal. No respiratory distress.     Breath sounds: Normal breath sounds. No wheezing or rales.  Abdominal:     General: There is no distension or abdominal bruit.     Palpations: Abdomen is soft.  Musculoskeletal:     Cervical back: Normal range of motion and neck supple.     Right lower leg: No edema.     Left lower leg: No edema.  Lymphadenopathy:     Cervical: No cervical adenopathy.  Skin:    General: Skin is warm and dry.     Coloration: Skin is not pale.     Findings: No rash.  Neurological:     Mental Status: He is alert.     Coordination: Coordination normal.     Deep Tendon Reflexes: Reflexes are normal and symmetric. Reflexes normal.  Psychiatric:        Mood and Affect: Mood normal.           Assessment & Plan:   Problem List Items Addressed This Visit  Cardiovascular and Mediastinum   Aortic stenosis    Seeing cardiology  Mod to severe on recent echo  Has follow up with thoracic surgeon soon to discuss treatment optoins       Relevant Medications   rosuvastatin (CRESTOR) 40 MG tablet   CAD (coronary artery disease)    Seeing cardiology  No angina currently if he does not over exert  Has AS and plans visit with surgeon to discuss treatment  HTN not opt controlled  Taking crestor and asa 81 mg   Reviewed last cardiology visit / echo and CT today      Relevant Medications   rosuvastatin (CRESTOR) 40 MG tablet   Essential hypertension, benign - Primary    BP: (!) 150/72  Not optimally controlled   Hydrochlorothiazide 25 mg daily   Amlodipine 10 mg daily  In past elevated K from ace- hesitant to use this or arb  Lab today   In light of AS-unsure of next step re: blood pressure control Will reach out to his cardiologist for advice       Relevant Medications   rosuvastatin (CRESTOR) 40 MG tablet   Other Relevant Orders   Basic metabolic panel (Completed)     Other   Alcohol intake above recommended sensible limits    Has cut back intake since dx of CAD and AS      Hyperkalemia    With ace Ace was d/c      Hyperlipidemia    Disc goals for lipids and reasons to control them Rev last labs with pt Rev low sat fat diet in detail Recently increase statin to crestor 40 mg   Will plan labs in 4-6 wk for this  Goal LDL under 70 with CAD  Diet has improved      Relevant Medications   rosuvastatin (CRESTOR) 40 MG tablet   Prediabetes    Lab Results  Component Value Date   HGBA1C 5.8 07/05/2023   disc imp of low glycemic diet and wt loss to prevent DM2       Relevant Orders   Hemoglobin A1c (Completed)

## 2023-07-05 NOTE — Patient Instructions (Addendum)
Lab today   I will reach out to cardiology regarding blood pressure and what they advise next   Take care of yourself   Continue follow up with cardiology   Continue crestor 40 mg daily         Mediterranean Diet  Why follow it? Research shows. Those who follow the Mediterranean diet have a reduced risk of heart disease  The diet is associated with a reduced incidence of Parkinson's and Alzheimer's diseases People following the diet may have longer life expectancies and lower rates of chronic diseases  The Dietary Guidelines for Americans recommends the Mediterranean diet as an eating plan to promote health and prevent disease  What Is the Mediterranean Diet?  Healthy eating plan based on typical foods and recipes of Mediterranean-style cooking The diet is primarily a plant based diet; these foods should make up a majority of meals   Starches - Plant based foods should make up a majority of meals - They are an important sources of vitamins, minerals, energy, antioxidants, and fiber - Choose whole grains, foods high in fiber and minimally processed items  - Typical grain sources include wheat, oats, barley, corn, brown rice, bulgar, farro, millet, polenta, couscous  - Various types of beans include chickpeas, lentils, fava beans, black beans, white beans   Fruits  Veggies - Large quantities of antioxidant rich fruits & veggies; 6 or more servings  - Vegetables can be eaten raw or lightly drizzled with oil and cooked  - Vegetables common to the traditional Mediterranean Diet include: artichokes, arugula, beets, broccoli, brussel sprouts, cabbage, carrots, celery, collard greens, cucumbers, eggplant, kale, leeks, lemons, lettuce, mushrooms, okra, onions, peas, peppers, potatoes, pumpkin, radishes, rutabaga, shallots, spinach, sweet potatoes, turnips, zucchini - Fruits common to the Mediterranean Diet include: apples, apricots, avocados, cherries, clementines, dates, figs, grapefruits,  grapes, melons, nectarines, oranges, peaches, pears, pomegranates, strawberries, tangerines  Fats - Replace butter and margarine with healthy oils, such as olive oil, canola oil, and tahini  - Limit nuts to no more than a handful a day  - Nuts include walnuts, almonds, pecans, pistachios, pine nuts  - Limit or avoid candied, honey roasted or heavily salted nuts - Olives are central to the [employer] - can be eaten whole or used in a variety of dishes   Meats Protein - Limiting red meat: no more than a few times a month - When eating red meat: choose lean cuts and keep the portion to the size of deck of cards - Eggs: approx. 0 to 4 times a week  - Fish and lean poultry: at least 2 a week  - Healthy protein sources include, chicken, [place], lean beef, lamb - Increase intake of seafood such as tuna, salmon, trout, mackerel, shrimp, scallops - Avoid or limit high fat processed meats such as sausage and bacon  Dairy - Include moderate amounts of low fat dairy products  - Focus on healthy dairy such as fat free yogurt, skim milk, low or reduced fat cheese - Limit dairy products higher in fat such as whole or 2% milk, cheese, ice cream  Alcohol - Moderate amounts of red wine is ok  - No more than 5 oz daily for women (all ages) and men older than age 8  - No more than 10 oz of wine daily for men younger than 103  Other - Limit sweets and other desserts  - Use herbs and spices instead of salt to flavor foods  - Herbs and spices  common to the traditional Mediterranean Diet include: basil, bay leaves, chives, cloves, cumin, fennel, garlic, lavender, marjoram, mint, oregano, parsley, pepper, rosemary, sage, savory, sumac, tarragon, thyme   It's not just a diet, it's a lifestyle:  The Mediterranean diet includes lifestyle factors typical of those in the region  Foods, drinks and meals are best eaten with others and savored Daily physical activity is important for overall good health This could  be strenuous exercise like running and aerobics This could also be more leisurely activities such as walking, housework, yard-work, or taking the stairs Moderation is the key; a balanced and healthy diet accommodates most foods and drinks Consider portion sizes and frequency of consumption of certain foods   Meal Ideas & Options:  Breakfast:  Whole wheat toast or whole wheat English muffins with peanut butter & hard boiled egg Steel cut oats topped with apples & cinnamon and skim milk  Fresh fruit: banana, strawberries, melon, berries, peaches  Smoothies: strawberries, bananas, greek yogurt, peanut butter Low fat greek yogurt with blueberries and granola  Egg white omelet with spinach and mushrooms Breakfast couscous: whole wheat couscous, apricots, skim milk, cranberries  Sandwiches:  Hummus and grilled vegetables (peppers, zucchini, squash) on whole wheat bread   Grilled chicken on whole wheat pita with lettuce, tomatoes, cucumbers or tzatziki  [place] salad on whole wheat bread: tuna salad made with greek yogurt, olives, red peppers, capers, green onions Garlic rosemary lamb pita: lamb sauted with garlic, rosemary, salt & pepper; add lettuce, cucumber, greek yogurt to pita - flavor with lemon juice and black pepper  Seafood:  Mediterranean grilled salmon, seasoned with garlic, basil, parsley, lemon juice and black pepper Shrimp, lemon, and spinach whole-grain pasta salad made with low fat greek yogurt  Seared scallops with lemon orzo  Seared tuna steaks seasoned salt, pepper, coriander topped with tomato mixture of olives, tomatoes, olive oil, minced garlic, parsley, green onions and cappers  Meats:  Herbed greek chicken salad with kalamata olives, cucumber, feta  Red bell peppers stuffed with spinach, bulgur, lean ground beef (or lentils) & topped with feta   Kebabs: skewers of chicken, tomatoes, onions, zucchini, squash  [place] burgers: made with red onions, mint, dill, lemon juice,  feta cheese topped with roasted red peppers Vegetarian Cucumber salad: cucumbers, artichoke hearts, celery, red onion, feta cheese, tossed in olive oil & lemon juice  Hummus and whole grain pita points with a greek salad (lettuce, tomato, feta, olives, cucumbers, red onion) Lentil soup with celery, carrots made with vegetable broth, garlic, salt and pepper  Tabouli salad: parsley, bulgur, mint, scallions, cucumbers, tomato, radishes, lemon juice, olive oil, salt and pepper.

## 2023-07-07 NOTE — Assessment & Plan Note (Signed)
Has cut back intake since dx of CAD and AS

## 2023-07-07 NOTE — Assessment & Plan Note (Addendum)
Disc goals for lipids and reasons to control them Rev last labs with pt Rev low sat fat diet in detail Recently increase statin to crestor 40 mg   Will plan labs in 4-6 wk for this  Goal LDL under 70 with CAD  Diet has improved

## 2023-07-07 NOTE — Assessment & Plan Note (Signed)
BP: (!) 150/72  Not optimally controlled   Hydrochlorothiazide 25 mg daily  Amlodipine 10 mg daily  In past elevated K from ace- hesitant to use this or arb  Lab today   In light of AS-unsure of next step re: blood pressure control Will reach out to his cardiologist for advice

## 2023-07-07 NOTE — Assessment & Plan Note (Signed)
With ace Ace was d/c

## 2023-07-07 NOTE — Assessment & Plan Note (Signed)
Seeing cardiology  No angina currently if he does not over exert  Has AS and plans visit with surgeon to discuss treatment  HTN not opt controlled  Taking crestor and asa 81 mg   Reviewed last cardiology visit / echo and CT today

## 2023-07-07 NOTE — Assessment & Plan Note (Signed)
Lab Results  Component Value Date   HGBA1C 5.8 07/05/2023   disc imp of low glycemic diet and wt loss to prevent DM2

## 2023-07-07 NOTE — Assessment & Plan Note (Signed)
Seeing cardiology  Mod to severe on recent echo  Has follow up with thoracic surgeon soon to discuss treatment optoins

## 2023-07-08 ENCOUNTER — Encounter: Payer: Self-pay | Admitting: Physician Assistant

## 2023-07-09 ENCOUNTER — Other Ambulatory Visit: Payer: Self-pay | Admitting: Family Medicine

## 2023-07-09 ENCOUNTER — Ambulatory Visit (HOSPITAL_COMMUNITY)
Admission: RE | Admit: 2023-07-09 | Discharge: 2023-07-09 | Disposition: A | Discharge status: Home / Self Care | Payer: Medicare HMO | Source: Ambulatory Visit | Attending: Family Medicine | Admitting: Family Medicine

## 2023-07-09 DIAGNOSIS — I350 Nonrheumatic aortic (valve) stenosis: Secondary | ICD-10-CM | POA: Diagnosis not present

## 2023-07-09 DIAGNOSIS — Z01818 Encounter for other preprocedural examination: Secondary | ICD-10-CM | POA: Diagnosis not present

## 2023-07-09 DIAGNOSIS — Z48812 Encounter for surgical aftercare following surgery on the circulatory system: Secondary | ICD-10-CM | POA: Diagnosis not present

## 2023-07-09 DIAGNOSIS — K5790 Diverticulosis of intestine, part unspecified, without perforation or abscess without bleeding: Secondary | ICD-10-CM | POA: Diagnosis not present

## 2023-07-09 DIAGNOSIS — K429 Umbilical hernia without obstruction or gangrene: Secondary | ICD-10-CM | POA: Diagnosis not present

## 2023-07-09 MED ORDER — IOHEXOL 350 MG/ML SOLN
95.0000 mL | Freq: Once | INTRAVENOUS | Status: AC | PRN
  Administered 2023-07-09: 95 mL via INTRAVENOUS

## 2023-07-09 NOTE — Telephone Encounter (Signed)
Please let pt know that I want to go ahead and add medication for blood pressure I did check in with his cardiologist   I want to sent in metoprolol xl 50 mg (the longer acting version for the medication he takes before CT scans  This will also lower pulse rate  If dizzy or pulse less than 60 please let me know  If blood pressure under 90s/60s also hold it and let me know   Please send to his preferred pharmacy (is pended)   Follow up with me in 4-6 weeks when able

## 2023-07-10 ENCOUNTER — Ambulatory Visit: Payer: Medicare HMO | Admitting: Nurse Practitioner

## 2023-07-10 ENCOUNTER — Encounter: Payer: Self-pay | Admitting: Internal Medicine

## 2023-07-10 ENCOUNTER — Ambulatory Visit: Payer: Medicare HMO | Attending: Internal Medicine | Admitting: Internal Medicine

## 2023-07-10 VITALS — BP 134/90 | HR 96 | Resp 16

## 2023-07-10 VITALS — BP 134/90 | HR 104 | Ht 67.5 in | Wt 217.2 lb

## 2023-07-10 DIAGNOSIS — Z006 Encounter for examination for normal comparison and control in clinical research program: Secondary | ICD-10-CM

## 2023-07-10 DIAGNOSIS — Z6831 Body mass index (BMI) 31.0-31.9, adult: Secondary | ICD-10-CM | POA: Diagnosis not present

## 2023-07-10 DIAGNOSIS — I2510 Atherosclerotic heart disease of native coronary artery without angina pectoris: Secondary | ICD-10-CM

## 2023-07-10 DIAGNOSIS — I10 Essential (primary) hypertension: Secondary | ICD-10-CM | POA: Diagnosis not present

## 2023-07-10 DIAGNOSIS — E785 Hyperlipidemia, unspecified: Secondary | ICD-10-CM

## 2023-07-10 DIAGNOSIS — I350 Nonrheumatic aortic (valve) stenosis: Secondary | ICD-10-CM

## 2023-07-10 DIAGNOSIS — N182 Chronic kidney disease, stage 2 (mild): Secondary | ICD-10-CM | POA: Diagnosis not present

## 2023-07-10 MED ORDER — HYDROCHLOROTHIAZIDE 25 MG PO TABS: 25.0000 mg | ORAL_TABLET | Freq: Every day | ORAL | 2 refills | Status: DC

## 2023-07-10 NOTE — Patient Instructions (Signed)
Medication Instructions:  No changes *If you need a refill on your cardiac medications before your next appointment, please call your pharmacy*   Lab Work: none If you have labs (blood work) drawn today and your tests are completely normal, you will receive your results only by: [place] (if you have MyChart) OR A paper copy in the mail If you have any lab test that is abnormal or we need to change your treatment, we will call you to review the results.   Testing/Procedures: none   Follow-Up: Per Structural Heart Team

## 2023-07-10 NOTE — Progress Notes (Signed)
Pre Surgical Assessment: 5 M Walk Test  66M=16.3ft  5 Meter Walk Test- trial 1: 5.65 seconds 5 Meter Walk Test- trial 2: 4.57 seconds 5 Meter Walk Test- trial 3: 5.44 seconds 5 Meter Walk Test Average: 5.22 seconds

## 2023-07-10 NOTE — Addendum Note (Signed)
Addended by: Roxy Manns A on: 07/10/2023 09:00 PM   Modules accepted: Orders

## 2023-07-10 NOTE — Research (Signed)
Progress CAP Informed Consent   Subject Name: [name]  Subject met inclusion and exclusion criteria.  The informed consent form, study requirements and expectations were reviewed with the subject and questions and concerns were addressed prior to the signing of the consent form. The shared decision making tool was reviewed with the subject. The subject verbalized understanding of the trial requirements.  The subject agreed to participate in the Progress CAP trial and signed the informed consent on 07/10/2023.  The informed consent was obtained prior to performance of any protocol-specific procedures for the subject.  A copy of the signed informed consent was given to the subject and a copy was placed in the subject's medical record.   [name]       Screening Visit    Medical History: FOCUSED CARDIOVASCULAR PROBLEM LIST:   1.  Moderate aortic stenosis with an aortic valve area of 1.13 cm2 and a mean gradient 34 mmHg ejection fraction of 60-65%; EKG without conduction deficits 2.  Hypertension 3.  Hyperlipidemia 4.  BMI of 33 5.  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  CKD stage II    Medications:  Current Outpatient Medications:    amLODipine (NORVASC) 10 MG tablet, Take 1 tablet (10 mg total) by mouth daily. (Patient taking differently: Take 10 mg by mouth every evening.), Disp: 90 tablet, Rfl: 0   aspirin EC 81 MG tablet, Take 1 tablet (81 mg total) by mouth daily. Swallow whole., Disp: 90 tablet, Rfl: 3   DOXYLAMINE SUCCINATE, SLEEP, PO, Take 50 mg by mouth at bedtime., Disp: , Rfl:    hydrochlorothiazide (HYDRODIURIL) 25 MG tablet, Take 1 tablet (25 mg total) by mouth daily., Disp: 30 tablet, Rfl: 0   levothyroxine (SYNTHROID) 25 MCG tablet, TAKE 1 TABLET BY MOUTH DAILY BEFORE BREAKFAST., Disp: 90 tablet, Rfl: 2   rosuvastatin (CRESTOR) 40 MG tablet, Take 1 tablet (40 mg total) by mouth daily., Disp: 90 tablet, Rfl: 2    Society of  Thoracic Surgeons Score:   Procedure Type: Isolated AVR Perioperative OutcomeEstimate % Operative Mortality1.4% Morbidity & Mortality6.77% Stroke0.896% Renal Failure1.06% Reoperation3.85% Prolonged Ventilation3.11% Deep Sternal Wound Infection0.064% Long Hospital Stay (>14 days)2.43% [facility] Hospital Stay (<6 days)*59%    European System for Cardiac Operative Risk Evaluation (EuroSCORE) ll: 0.75%    Surgical Risk assessed by Heart Team:  [x]  - Low []  - Intermediate []  - High []  - Extreme   New York Heart Association  (NYHA) ll   Canadian Cardiovascular Society Score (CCS) angina grade: 2   Transthoracic echocardiogram (TTE), including DSE as applicable:    12- lead electrocardiogram:    Cardiac CTA with 3D within 1 year: 07/09/2023   NIHSS MRS   Labs: See labs  KCCQ: See worksheet   [id number]: See worksheet   SF-36:  See worksheet

## 2023-07-12 LAB — COMPREHENSIVE METABOLIC PANEL
ALT: 30 IU/L (ref 0–44)
AST: 34 IU/L (ref 0–40)
Albumin: 5.1 g/dL — ABNORMAL HIGH (ref 3.8–4.8)
Alkaline Phosphatase: 73 IU/L (ref 44–121)
BUN/Creatinine Ratio: 19 (ref 10–24)
BUN: 19 mg/dL (ref 8–27)
Bilirubin Total: 0.8 mg/dL (ref 0.0–1.2)
CO2: 21 mmol/L (ref 20–29)
Calcium: 9.7 mg/dL (ref 8.6–10.2)
Chloride: 99 mmol/L (ref 96–106)
Creatinine, Ser: 1 mg/dL (ref 0.76–1.27)
Globulin, Total: 2.2 g/dL (ref 1.5–4.5)
Glucose: 107 mg/dL — ABNORMAL HIGH (ref 70–99)
Potassium: 4.2 mmol/L (ref 3.5–5.2)
Sodium: 139 mmol/L (ref 134–144)
Total Protein: 7.3 g/dL (ref 6.0–8.5)
eGFR: 79 mL/min/{1.73_m2} (ref >59)

## 2023-07-12 LAB — CBC
Hematocrit: 42.3 % (ref 37.5–51.0)
Hemoglobin: 14.5 g/dL (ref 13.0–17.7)
MCH: 33 pg (ref 26.6–33.0)
MCHC: 34.3 g/dL (ref 31.5–35.7)
MCV: 96 fL (ref 79–97)
Platelets: 136 10*3/uL — ABNORMAL LOW (ref 150–450)
RBC: 4.4 x10E6/uL (ref 4.14–5.80)
RDW: 12.6 % (ref 11.6–15.4)
WBC: 5.8 10*3/uL (ref 3.4–10.8)

## 2023-07-12 LAB — PRO B NATRIURETIC PEPTIDE: NT-Pro BNP: 224 pg/mL (ref 0–376)

## 2023-07-12 MED ORDER — METOPROLOL SUCCINATE ER 50 MG PO TB24: 50.0000 mg | ORAL_TABLET | Freq: Every day | ORAL | 0 refills | Status: DC

## 2023-07-12 NOTE — Telephone Encounter (Signed)
Left message to return call to our office.  

## 2023-07-12 NOTE — Telephone Encounter (Signed)
Called patient and reviewed all information. Patient verbalized understanding. Will call if any further questions.  

## 2023-07-12 NOTE — Telephone Encounter (Signed)
Yes, adding it  Thanks

## 2023-07-12 NOTE — Telephone Encounter (Signed)
Patient called in returning call he received. Relayed message below to patient and he stated that he understood. He did ask if this was replacing a medication or if this was an addition. Informed him that she was adding the medication.

## 2023-07-15 DIAGNOSIS — Z006 Encounter for examination for normal comparison and control in clinical research program: Secondary | ICD-10-CM

## 2023-07-17 ENCOUNTER — Telehealth: Payer: Self-pay

## 2023-07-17 DIAGNOSIS — I350 Nonrheumatic aortic (valve) stenosis: Secondary | ICD-10-CM

## 2023-07-17 NOTE — Telephone Encounter (Signed)
Left message for the patient to contact the office.    [name] has been excluded from PROGRESS CAP due to severe sub-annular calcification (LVOT calcium).  At this time the pt does not need to see the cardiac surgeon and his appointment with Dr Leafy Ro on 10/14 can be cancelled.  Per Dr Lynnette Caffey the patient should have a TTE and OV in 6 months to continue surveillance of aortic stenosis.

## 2023-07-31 ENCOUNTER — Encounter: Payer: Medicare HMO | Admitting: Surgery

## 2023-07-31 NOTE — Telephone Encounter (Signed)
Left message for the patient to contact the office. 

## 2023-07-31 NOTE — Telephone Encounter (Signed)
Patient aware that he has been excluded from PROGRESS CAP trial and will need continued surveillance of aortic stenosis.  Echo and follow up office visit scheduled for 12/30/2023.  The patient was advised to contact the office if he has any new or worsening SOB, CP, dizziness or syncope.

## 2023-07-31 NOTE — Addendum Note (Signed)
Addended by: Iona Coach on: 07/31/2023 05:20 PM   Modules accepted: Orders

## 2023-08-05 ENCOUNTER — Ambulatory Visit: Payer: Medicare HMO

## 2023-08-05 ENCOUNTER — Ambulatory Visit: Payer: Medicare HMO | Admitting: Cardiology

## 2023-08-12 ENCOUNTER — Encounter: Payer: Medicare HMO | Admitting: Thoracic Surgery (Cardiothoracic Vascular Surgery)

## 2023-08-13 ENCOUNTER — Other Ambulatory Visit (INDEPENDENT_AMBULATORY_CARE_PROVIDER_SITE_OTHER): Payer: Medicare HMO

## 2023-08-13 DIAGNOSIS — E7800 Pure hypercholesterolemia, unspecified: Secondary | ICD-10-CM

## 2023-08-13 LAB — LIPID PANEL
Cholesterol: 217 mg/dL — ABNORMAL HIGH (ref 0–200)
HDL: 65.5 mg/dL (ref >39.00)
LDL Cholesterol: 130 mg/dL — ABNORMAL HIGH (ref 0–99)
NonHDL: 151.56
Total CHOL/HDL Ratio: 3
Triglycerides: 110 mg/dL (ref 0.0–149.0)
VLDL: 22 mg/dL (ref 0.0–40.0)

## 2023-08-13 LAB — AST: AST: 31 U/L (ref 0–37)

## 2023-08-13 LAB — ALT: ALT: 29 U/L (ref 0–53)

## 2023-08-14 ENCOUNTER — Telehealth: Payer: Self-pay

## 2023-08-14 DIAGNOSIS — Z79899 Other long term (current) drug therapy: Secondary | ICD-10-CM

## 2023-08-14 DIAGNOSIS — E7800 Pure hypercholesterolemia, unspecified: Secondary | ICD-10-CM

## 2023-08-14 NOTE — Telephone Encounter (Signed)
-----   Message from Armanda Magic sent at 08/14/2023  7:14 AM EDT ----- Alcario Drought please find out if patient has missed any statin doses ----- Message ----- From: Judy Pimple, MD Sent: 08/13/2023   7:40 PM EDT To: Quintella Reichert, MD; [facility]  Cholesterol is not down much  Have you missed any crestor doses (40 mg)  Any issues with it ?  Has diet changed ?  I will cc cardiology

## 2023-08-14 NOTE — Telephone Encounter (Signed)
Call to patient to discuss compliance with medication/diet/lifestyle. Patient reports he has not missed any doses of his crestor. He reports he goes to the gym 5x a week and has had no muscle aches/joint aches from taking his crestor. He also reports he feels he has been on a good diet with the exception of the fries and steak sandwich he had the night before his test. Patient responses forwarded to Dr. Mayford Knife and Tower.

## 2023-08-15 NOTE — Telephone Encounter (Signed)
Call to patient to discuss Dr. Norris Cross recommendations, no answer, left detailed message per DPR asking patient to call our office to discuss.

## 2023-08-22 ENCOUNTER — Encounter: Payer: Self-pay | Admitting: Family Medicine

## 2023-08-27 ENCOUNTER — Encounter: Payer: Self-pay | Admitting: Family Medicine

## 2023-09-08 ENCOUNTER — Encounter: Payer: Self-pay | Admitting: Family Medicine

## 2023-09-08 ENCOUNTER — Other Ambulatory Visit: Payer: Self-pay | Admitting: Family Medicine

## 2023-09-13 NOTE — Telephone Encounter (Signed)
Call to patient to discuss Dr. Norris Cross recommendations, no answer, left detailed message per DPR asking patient to call our office to discuss starting zetia and repeating labs in 8 weeks.

## 2023-09-20 NOTE — Research (Signed)
Progress CAP  Patient has screen failed Progress CAP study due to severe sub-annular calcification (LVOT calcium).

## 2023-09-23 MED ORDER — EZETIMIBE 10 MG PO TABS
10.0000 mg | ORAL_TABLET | Freq: Every day | ORAL | 3 refills | Status: DC
Start: 2023-09-23 — End: 2023-12-26

## 2023-09-23 NOTE — Telephone Encounter (Signed)
Call to patient to discuss Dr. Norris Cross recommendations. Patient states he has not missed any doses of his crestor and agrees to start zetia since LDL remains high. Zetia order sent to pharmacy of choice, orders for repeat labs placed and released, patient instruction to complete labs at [facility] or choice on or around 11/18/23.

## 2023-09-23 NOTE — Addendum Note (Signed)
Addended by: Luellen Pucker on: 09/23/2023 02:38 PM   Modules accepted: Orders

## 2023-10-08 ENCOUNTER — Other Ambulatory Visit: Payer: Self-pay | Admitting: Family Medicine

## 2023-10-16 ENCOUNTER — Encounter: Payer: Self-pay | Admitting: Family Medicine

## 2023-10-24 ENCOUNTER — Other Ambulatory Visit: Payer: Self-pay

## 2023-10-24 DIAGNOSIS — I350 Nonrheumatic aortic (valve) stenosis: Secondary | ICD-10-CM

## 2023-10-25 ENCOUNTER — Ambulatory Visit (HOSPITAL_COMMUNITY): Payer: Medicare HMO | Attending: Cardiology

## 2023-10-25 DIAGNOSIS — I350 Nonrheumatic aortic (valve) stenosis: Secondary | ICD-10-CM | POA: Insufficient documentation

## 2023-10-25 LAB — ECHOCARDIOGRAM COMPLETE
AR max vel: 1.15 cm2
AV Area VTI: 1.05 cm2
AV Area mean vel: 1.18 cm2
AV Mean grad: 38.7 mm[Hg]
AV Peak grad: 67.7 mm[Hg]
Ao pk vel: 4.11 m/s
Area-P 1/2: 4.6 cm2
S' Lateral: 2.4 cm

## 2023-11-06 NOTE — Progress Notes (Signed)
 Patient ID: [name] MRN: [id number] DOB/AGE: 76-01-1946 76 y.o.  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 male with the indicated medical history here for recommendations regarding ongoing dyspnea and chest discomfort.  Patient was seen by general cardiology recently.  He had reported increasing shortness of breath and chest discomfort starting over the summer.  He is referred for coronary CTA which suggested a significant stenosis in the mid to distal left anterior descending artery.  An echocardiogram demonstrated moderate aortic stenosis with a calcified aortic valve with diminished mobility.  He underwent cardiac catheterization which demonstrated moderate obstructive coronary artery disease of the proximal LAD.  His cardiac output and index were preserved with relatively normal filling pressures.  He is here with his wife today.  He reports exertional chest discomfort with more than moderate exertion.  He has had to modulate his level of activity because of it.  This is also associated with shortness of breath.  He has fortunately had no presyncope or syncope.  He denies any fatigue however.  He denies any peripheral edema, paroxysmal nocturnal dyspnea, orthopnea.  He has had some mild dependent edema at times.  He is normally quite an active individual but he has definitely noticed over the last few months that he is not able to do as much as he would like to do.  Used to be able to kayak and because of these new  limitations he has not yet tried kayaking again.  He would like to hike had a higher intensity than he is currently.  He reports very good dental health.  Plan: Refer for progress trial inclusion.  1/25: In the interim the patient was found not to be a candidate for the progress trial due to his LVOT calcification pattern.  He underwent coronary angiography which demonstrated moderate disease and relatively normal filling pressures.  He contacted our office with reports of increasing exertional dyspnea.  He had an echocardiogram which demonstrated progression to severe aortic stenosis.  In December he noticed increasing shortness of breath.  He was helping his son carry a table up to the attic and he became quite short of breath after doing this.  His wife is told me that he occasionally he gets ashen.  He has noticed increasing shortness of breath when he walks up a hill.  He denies any exertional angina.  He has had no dizziness or syncope.  He has noticed some increasing ankle edema.  He is on chronic amlodipine  but is noticed that this has been a little bit more noticeable.  It is not bothersome.  He denies any paroxysmal nocturnal dyspnea, orthopnea, signs or symptoms of stroke, or severe bleeding.  He is motivated to do something to feel better.  Past Medical History:  Diagnosis Date   Arthritis    LEFT knee   Back pain    Diverticulosis    History of colon polyps    Hyperlipidemia    on meds   Hypertension    on meds  Moderate aortic stenosis    Thyroid  disease    on meds    Past Surgical History:  Procedure Laterality Date   COLONOSCOPY  2018   SA-MAC-[id number]-ageq with lavage-tics/int hems/TA x 5 frags   INGUINAL HERNIA REPAIR Bilateral 02/13/2017   Procedure: LAPAROSCOPIC BILATERAL INGUINAL HERNIA REPAIR WITH MESH;  Surgeon: Vicenta Poli, MD;  Location: [facility] OR;  Service: General;  Laterality: Bilateral;   LASIK     POLYPECTOMY  2018   TA x 5   RIGHT HEART CATH AND CORONARY  ANGIOGRAPHY N/A 07/03/2023   Procedure: RIGHT HEART CATH AND CORONARY ANGIOGRAPHY;  Surgeon: Wendel Lurena POUR, MD;  Location: [facility];  Service: Cardiovascular;  Laterality: N/A;   UMBILICAL HERNIA REPAIR N/A 02/13/2017   Procedure: LAPAROSCOPIC UMBILICAL HERNIA REPAIR;  Surgeon: Vicenta Poli, MD;  Location: [facility] OR;  Service: General;  Laterality: N/A;   WISDOM TOOTH EXTRACTION      Family History  Problem Relation Age of Onset   Cancer Mother        brain tumor   Emphysema Father    Diabetes Father    HIV Brother    Colon cancer Neg Hx    Colon polyps Neg Hx    Esophageal cancer Neg Hx    Rectal cancer Neg Hx    Stomach cancer Neg Hx     Social History   Socioeconomic History   Marital status: Married    Spouse name: Not on file   Number of children: 1   Years of education: Not on file   Highest education level: Associate degree: occupational, scientist, product/process development, or vocational program  Occupational History    Employer: [employer]  Tobacco Use   Smoking status: Never   Smokeless tobacco: Never  Vaping Use   Vaping status: Never Used  Substance and Sexual Activity   Alcohol use: Yes    Alcohol/week: 21.0 - 28.0 standard drinks of alcohol    Types: 21 - 28 Cans of beer per week    Comment: 3-4 cans of beer daily   Drug use: No   Sexual activity: Yes  Other Topics Concern   Not on file  Social History Narrative   Not on file   Social Drivers of Health   Financial Resource Strain: Low Risk  (07/04/2023)   Overall Financial Resource Strain (CARDIA)    Difficulty of Paying Living Expenses: Not hard at all  Food Insecurity: No Food Insecurity (07/04/2023)   Hunger Vital Sign    Worried About Running Out of Food in the Last Year: Never true    Ran Out of Food in the Last Year: Never true  Transportation Needs: No Transportation Needs (07/04/2023)   PRAPARE - Administrator, Civil Service (Medical): No    Lack of Transportation (Non-Medical): No  Physical  Activity: Sufficiently Active (07/04/2023)   Exercise Vital Sign    Days of Exercise per Week: 5 days    Minutes of Exercise per Session: 60 min  Stress: Not on file  Social Connections: Moderately Isolated (07/04/2023)   Social Connection and Isolation Panel [NHANES]    Frequency of Communication with Friends and Family: Never    Frequency of Social Gatherings with Friends and Family: More than three times a week    Attends Religious Services: Never    Database Administrator or Organizations: No    Attends Engineer, Structural: Not on file    Marital Status: Married  Catering Manager Violence:  Not on file     Prior to Admission medications   Medication Sig Start Date End Date Taking? Authorizing Provider  amLODipine  (NORVASC ) 10 MG tablet Take 1 tablet (10 mg total) by mouth daily. Patient taking differently: Take 10 mg by mouth every evening. 06/13/23   Tower, Laine LABOR, MD  aspirin  EC 81 MG tablet Take 1 tablet (81 mg total) by mouth daily. Swallow whole. 06/24/23   Shlomo Wilbert SAUNDERS, MD  DOXYLAMINE SUCCINATE, SLEEP, PO Take 50 mg by mouth at bedtime.    [provider]  hydrochlorothiazide  (HYDRODIURIL ) 25 MG tablet Take 1 tablet (25 mg total) by mouth daily. 06/20/23   Tower, Laine LABOR, MD  levothyroxine  (SYNTHROID ) 25 MCG tablet TAKE 1 TABLET BY MOUTH DAILY BEFORE BREAKFAST. 04/19/23   Tower, Laine LABOR, MD  metoprolol  tartrate (LOPRESSOR ) 50 MG tablet Take 1 tablet (50 mg total) by mouth as directed for 1 dose. Take 2 hours prior to CT scans to slow heart rate for study 07/04/23   Sebastian Lamarr SAUNDERS, PA-C  rosuvastatin  (CRESTOR ) 20 MG tablet Take 20 mg by mouth every evening.    [provider]    Allergies  Allergen Reactions   Ezetimibe -Simvastatin     REACTION: back pain    REVIEW OF SYSTEMS:  General: no fevers/chills/night sweats Eyes: no blurry vision, diplopia, or amaurosis ENT: no sore throat or hearing loss Resp: no cough, wheezing, or hemoptysis CV: no  edema or palpitations GI: no abdominal pain, nausea, vomiting, diarrhea, or constipation GU: no dysuria, frequency, or hematuria Skin: no rash Neuro: no headache, numbness, tingling, or weakness of extremities Musculoskeletal: no joint pain or swelling Heme: no bleeding, DVT, or easy bruising Endo: no polydipsia or polyuria  BP 122/74   Pulse 92   Ht 5' 7.5 (1.715 m)   Wt 220 lb 6.4 oz (100 kg)   SpO2 94%   BMI 34.01 kg/m   PHYSICAL EXAM: GEN:  AO x 3 in no acute distress HEENT: normal Dentition: Normal Neck: JVP normal. +2 carotid upstrokes without bruits. No thyromegaly. Lungs: equal expansion, clear bilaterally CV: Apex is discrete and nondisplaced, RRR with 3 out of 6 systolic ejection murmur Abd: soft, non-tender, non-distended; no bruit; positive bowel sounds Ext: no edema, ecchymoses, or cyanosis Vascular: 2+ femoral pulses, 2+ radial pulses       Skin: warm and dry without rash Neuro: CN II-XII grossly intact; motor and sensory grossly intact    DATA AND STUDIES:  EKG:     EKG done today demonstrates sinus rhythm with first-degree AV block and left bundle branch block  Cardiac Studies & Procedures   CARDIAC CATHETERIZATION  CARDIAC CATHETERIZATION 07/03/2023  Narrative   Dist RCA lesion is 40% stenosed.   Prox LAD lesion is 65% stenosed.   Lat 1st Diag lesion is 80% stenosed.  1.  Moderate calcified proximal LAD lesion with mild diffuse disease elsewhere. 2.  Separate ostia of LAD and left circumflex arteries. 3.  Fick cardiac output of 11.3 L/min and Fick cardiac index of 5.4 L/min/m with the following hemodynamics: RA pressure mean of 8 mmHg RV pressure 41/3 with an end-diastolic pressure of 10 mmHg Wedge pressure mean of 12 mmHg with V waves to 18 mmHg PA pressure 42/9 with a mean of 23 mmHg Pulmonary vascular resistance of 1 Wood unit Pulmonary artery pulsatility index of 4 4.  Capacious iliofemoral vessels bilaterally. 5.  Right radial arterial  loop requiring coronary guidewire to navigate.  Recommendation: Continue  evaluation for management of concomitant aortic stenosis and coronary artery disease.  Findings Coronary Findings Diagnostic  Dominance: Right  Left Anterior Descending Prox LAD lesion is 65% stenosed.  Lateral First Diagonal Branch Lat 1st Diag lesion is 80% stenosed.  Ramus Intermedius There is mild diffuse disease throughout the vessel.  Left Circumflex There is mild diffuse disease throughout the vessel.  Right Coronary Artery There is mild diffuse disease throughout the vessel. Dist RCA lesion is 40% stenosed.  Intervention  No interventions have been documented.    ECHOCARDIOGRAM  ECHOCARDIOGRAM COMPLETE 10/25/2023  Narrative ECHOCARDIOGRAM REPORT    Patient Name:   [name] Date of Exam: 10/25/2023 Medical Rec #:  [id number]        Height:       67.5 in Accession #:    [id number]       Weight:       217.2 lb Date of Birth:  16-Nov-1945       BSA:          2.106 m Patient Age:    77 years         BP:           134/90 mmHg Patient Gender: M                HR:           87 bpm. Exam Location:  [address]  Procedure: 2D Echo, Cardiac Doppler, Color Doppler, 3D Echo and Strain Analysis  Indications:    I35.0 Nonrheumatic aortic (valve) stenosis  History:        Patient has prior history of Echocardiogram examinations, most recent 06/27/2023. CAD, Signs/Symptoms:Murmur; Risk Factors:Hypertension and Dyslipidemia. Prediabetes. Hypothyroidism.  Sonographer:    Jon Hacker RCS Referring Phys: [id number] Venita Seng K Lorea Kupfer  IMPRESSIONS   1. Left ventricular ejection fraction, by estimation, is 65 to 70%. The left ventricle has normal function. The left ventricle has no regional wall motion abnormalities. There is moderate left ventricular hypertrophy. Left ventricular diastolic parameters are consistent with Grade II diastolic dysfunction (pseudonormalization). 2. Right  ventricular systolic function is normal. The right ventricular size is normal. 3. Left atrial size was mildly dilated. 4. The mitral valve is normal in structure. Moderate mitral valve regurgitation. No evidence of mitral stenosis. 5. Tricuspid valve regurgitation is moderate. 6. The aortic valve is calcified. There is severe calcifcation of the aortic valve. There is severe thickening of the aortic valve. Aortic valve regurgitation is not visualized. Severe aortic valve stenosis. Aortic valve area, by VTI measures 1.05 cm. Aortic valve mean gradient measures 38.7 mmHg. Aortic valve Vmax measures 4.11 m/s. 7. There is borderline dilatation of the ascending aorta, measuring 39 mm. 8. The inferior vena cava is normal in size with greater than 50% respiratory variability, suggesting right atrial pressure of 3 mmHg.  Comparison(s): Prior images reviewed side by side. 06/27/23: AV mean gradient 34 mmHg.  FINDINGS Left Ventricle: Left ventricular ejection fraction, by estimation, is 65 to 70%. The left ventricle has normal function. The left ventricle has no regional wall motion abnormalities. 3D ejection fraction reviewed and evaluated as part of the interpretation. Alternate measurement of EF is felt to be most reflective of LV function. The left ventricular internal cavity size was normal in size. There is moderate left ventricular hypertrophy. Left ventricular diastolic parameters are consistent with Grade II diastolic dysfunction (pseudonormalization).  Right Ventricle: The right ventricular size is normal. No increase in right ventricular wall thickness. Right ventricular systolic  function is normal.  Left Atrium: Left atrial size was mildly dilated.  Right Atrium: Right atrial size was normal in size.  Pericardium: There is no evidence of pericardial effusion.  Mitral Valve: The mitral valve is normal in structure. Moderate mitral valve regurgitation. No evidence of mitral valve  stenosis.  Tricuspid Valve: The tricuspid valve is normal in structure. Tricuspid valve regurgitation is moderate . No evidence of tricuspid stenosis.  Aortic Valve: The aortic valve is calcified. There is severe calcifcation of the aortic valve. There is severe thickening of the aortic valve. Aortic valve regurgitation is not visualized. Severe aortic stenosis is present. Aortic valve mean gradient measures 38.7 mmHg. Aortic valve peak gradient measures 67.7 mmHg. Aortic valve area, by VTI measures 1.05 cm.  Pulmonic Valve: The pulmonic valve was normal in structure. Pulmonic valve regurgitation is not visualized. No evidence of pulmonic stenosis.  Aorta: The aortic root is normal in size and structure. There is borderline dilatation of the ascending aorta, measuring 39 mm.  Venous: The inferior vena cava is normal in size with greater than 50% respiratory variability, suggesting right atrial pressure of 3 mmHg.  IAS/Shunts: No atrial level shunt detected by color flow Doppler.   LEFT VENTRICLE PLAX 2D LVIDd:         4.25 cm   Diastology LVIDs:         2.40 cm   LV e' medial:    8.81 cm/s LV PW:         1.30 cm   LV E/e' medial:  19.1 LV IVS:        1.40 cm   LV e' lateral:   9.14 cm/s LVOT diam:     2.00 cm   LV E/e' lateral: 18.4 LV SV:         95 LV SV Index:   45 LVOT Area:     3.14 cm  3D Volume EF: 3D EF:        56 % LV EDV:       201 ml LV ESV:       88 ml LV SV:        113 ml  RIGHT VENTRICLE RV Basal diam:  3.30 cm RV S prime:     15.30 cm/s TAPSE (M-mode): 3.6 cm  LEFT ATRIUM             Index        RIGHT ATRIUM           Index LA diam:        4.60 cm 2.18 cm/m   RA Area:     12.50 cm LA Vol (A2C):   73.6 ml 34.95 ml/m  RA Volume:   22.00 ml  10.45 ml/m LA Vol (A4C):   83.6 ml 39.70 ml/m LA Biplane Vol: 78.0 ml 37.04 ml/m AORTIC VALVE AV Area (Vmax):    1.15 cm AV Area (Vmean):   1.18 cm AV Area (VTI):     1.05 cm AV Vmax:           411.33  cm/s AV Vmean:          287.000 cm/s AV VTI:            0.901 m AV Peak Grad:      67.7 mmHg AV Mean Grad:      38.7 mmHg LVOT Vmax:         150.00 cm/s LVOT Vmean:        108.000 cm/s  LVOT VTI:          0.302 m LVOT/AV VTI ratio: 0.34  AORTA Ao Root diam: 3.30 cm Ao Asc diam:  3.90 cm  MITRAL VALVE MV Area (PHT): 4.60 cm     SHUNTS MV Decel Time: 165 msec     Systemic VTI:  0.30 m MV E velocity: 168.00 cm/s  Systemic Diam: 2.00 cm MV A velocity: 117.00 cm/s MV E/A ratio:  1.44  Oneil Parchment MD Electronically signed by Oneil Parchment MD Signature Date/Time: 10/25/2023/4:32:19 PM    Final    CT SCANS  CT CORONARY MORPH W/CTA COR W/SCORE 07/09/2023  Addendum 07/11/2023  9:35 AM ADDENDUM REPORT: 07/11/2023 09:32  FINDINGS: Extracardiac findings will be described separately under dictation for contemporaneously obtained CTA chest, abdomen and pelvis.  IMPRESSION: Please see separate dictation for contemporaneously obtained CTA chest, abdomen and pelvis dated 07/11/2023 for full description of relevant extracardiac findings.   Electronically Signed By: Rea Marc M.D. On: 07/11/2023 09:32  Narrative CLINICAL DATA:  Aortic valve replacement (TAVR), pre-op eval  EXAM: Cardiac TAVR CT  TECHNIQUE: The patient was scanned on a Siemens Force 192 slice scanner. A 120 kV retrospective scan was triggered in the descending thoracic aorta at 111 HU's. Gantry rotation speed was 270 msecs and collimation was .9 mm. The 3D data set was reconstructed in 5% intervals of the R-R cycle. Systolic and diastolic phases were analyzed on a dedicated work station using MPR, MIP and VRT modes. The patient received 95mL OMNIPAQUE  IOHEXOL  350 MG/ML SOLN of contrast.  FINDINGS: Aortic Valve:  Tricuspid aortic valve with severely reduced cusp excursion. Severely thickened and severely calcified aortic valve cusps.  AV calcium  score: 3022  Virtual Basal Annulus  Measurements:  Maximum/Minimum Diameter: 28.7 x 22.2 mm  Perimeter: 79.1 mm  Area:  475 mm2  LVOT calcifications, small protruding calcification below L-N commissure and large protruding calcification below left coronary cusp.  Membranous septal length: 6 mm  Based on these measurements, the annulus would be suitable for a 26 mm Sapien 3 valve. Alternatively, Heart Team can consider 29 mm Evolut valve. Recommend Heart Team discussion for valve selection.  Sinus of Valsalva Measurements:  Non-coronary:  37 mm  Right - coronary:  35 mm  Left - coronary:  37 mm  Sinus of Valsalva Height:  Left: 25 mm  Right: 24 mm  Aorta: Conventional 3 vessel branch pattern of aortic arch.  Sinotubular Junction:  30 mm  Ascending Thoracic Aorta: 39 mm  Aortic Arch:  30 mm  Descending Thoracic Aorta:  29 mm  Coronary Artery Height above Annulus:  Left circumflex/LAD origins: 18 mm  Right coronary: 21 mm  Coronary Arteries: Normal coronary origin. Right dominance. The study was performed without use of NTG and insufficient for plaque evaluation. Coronary artery calcium  seen in 3 vessel distribution.  Optimum Fluoroscopic Angle for Delivery: LAO 4 CAU 3  OTHER:  Atria: Moderate LA dilation.  Mild RA dilation.  Left atrial appendage: No thrombus.  Mitral valve: Grossly normal, trivial mitral annular calcifications.  Pulmonary artery: Normal caliber.  Pulmonary veins: Normal anatomy.  IMPRESSION: 1. Tricuspid aortic valve with severely reduced cusp excursion. Severely thickened and severely calcified aortic valve cusps. 2. Aortic valve calcium  score: 3022 3. Annulus area: 475 mm2, suitable for 26 mm Sapien 3 valve. LVOT calcifications, small protruding calcification below L-N commissure and large protruding calcification below left coronary cusp. Membranous septal length 6 mm. 4. Sufficient coronary artery heights from annulus.  5. Optimum fluoroscopic angle for  delivery: LAO 4 CAU 3  Electronically Signed: By: Soyla Merck M.D. On: 07/10/2023 12:28   CT SCANS  CT CORONARY MORPH W/CTA COR W/SCORE 06/11/2023  Addendum 06/19/2023  2:49 PM ADDENDUM REPORT: 06/19/2023 14:46  EXAM: OVER-READ INTERPRETATION  CT CHEST  The following report is an over-read performed by radiologist Dr. Fonda Mom [facility] on 06/19/2023. This over-read does not include interpretation of cardiac or coronary anatomy or pathology. The coronary CTA interpretation by the cardiologist is attached.  COMPARISON:  None.  FINDINGS: Cardiovascular:  Findings discussed in the body of the report.  Mediastinum/Nodes: No suspicious adenopathy identified. Imaged mediastinal structures are unremarkable.  Lungs/Pleura: There is dependent basilar subsegmental atelectasis. No pneumonia or pulmonary edema. No pleural effusion or pneumothorax.  Upper Abdomen: No acute abnormality.  Musculoskeletal: There is mild bilateral gynecomastia. There are thoracic degenerative changes.  IMPRESSION: Mild bilateral gynecomastia. Otherwise no significant extracardiac incidental findings identified.   Electronically Signed By: Fonda Field M.D. On: 06/19/2023 14:46  Narrative CLINICAL DATA:  This is 78 year old male with anginal symptoms.  EXAM: Cardiac/Coronary  CTA  TECHNIQUE: The patient was scanned on a [employer].  FINDINGS: A 100 kV prospective scan was triggered in the descending thoracic aorta at 111 HU's. Axial non-contrast 3 mm slices were carried out through the heart. The data set was analyzed on a dedicated work station and scored using the Agatson method. Gantry rotation speed was 250 msecs and collimation was .6 mm. No beta blockade and 0.8 mg of sl NTG was given. The 3D data set was reconstructed in 5% intervals of the 67-82 % of the R-R cycle. Diastolic phases were analyzed on a dedicated work station using MPR,  MIP and VRT modes. The patient received 80 cc of contrast.  Image Quality: Fair with misregistration artifact.  Aorta: Normal size. Mild aortic root calcifications. No dissection.  Aortic Valve: Trileaflet. Moderate calcifications (Agatston score 4650).  Coronary Arteries:  Normal coronary origin.  Right dominance.  RCA is a large dominant artery that gives rise to PDA and PLA. The proximal RCA is not well visualized due to artifact. The mid and distal RCA minimal (<24%) focal calcifications.  Short left main artery that gives rise to LAD and LCX arteries. There is minimal calcification in the distal portion of the vessel.  LAD is a large vessel. Diffuse long calcified plaque unable to accurately quantified due to blooming artifact but suspect it is moderate (50-69%) throughout the proximal, mid LAD extending into the first portion of the distal LAD. D1 is small caliber vessel with a focal mild (24-59%) calcified plaque. D2 is a large caliber vessel with moderate calcified plaque in the proximal portion of the vessel then split off to terminate into two branches. The branch on the right with moderate plaque which tapers of as mild. The branch on the right with minimal focal calcified plaque.  LCX is a non-dominant artery that gives rise to one large OM1 branch. The LCX with moderate calcification throughout the proximal to mid LCX. The distal LCX with diffuse mild calcifications.  Coronary Calcium  Score:  Left main: 52  Left anterior descending artery: 1819  Left circumflex artery: 952  Right coronary artery: 847  Total: 3670  Percentile: 96  Other findings:  Normal pulmonary vein drainage into the left atrium.  Normal left atrial appendage without a thrombus.  Normal size of the pulmonary artery.  Moderate mitral annular calcification.  IMPRESSION: 1. Coronary calcium  score of 3670. This was 42 percentile for age and sex matched control.  2. Normal  coronary origin with right dominance.  3. CAD-RADS 3. Moderate stenosis. Consider symptom-guided anti-ischemic pharmacotherapy as well as risk factor modification per guideline directed care. Additional analysis with CT FFR will be submitted.  4.  Aortic valve calcification (Agatston score 4650).  5.  Moderate mitral annular calcification.  The noncardiac portion of this study will be interpreted in separate report by the radiologist.  Electronically Signed: By: Kardie  Tobb D.O. On: 06/11/2023 15:17          03/15/2023: TSH 2.67 07/10/2023: BUN 19; Creatinine, Ser 1.00; Hemoglobin 14.5; NT-Pro BNP 224; Platelets 136; Potassium 4.2; Sodium 139 08/13/2023: ALT 29   STS RISK CALCULATOR:   Procedure Type: Isolated AVR Perioperative Outcome Estimate % Operative Mortality 1.4% Morbidity & Mortality 6.77% Stroke 0.896% Renal Failure 1.06% Reoperation 3.85% Prolonged Ventilation 3.11% Deep Sternal Wound Infection 0.064% Long Hospital Stay (>14 days) 2.43% Short Hospital Stay (<6 days)* 59%   NHYA CLASS: 2  CCS CLASS: 2  Euroscore II: 0.75%   ASSESSMENT AND PLAN:   Nonrheumatic aortic valve stenosis - Plan: EKG 12-Lead  Coronary artery disease involving native coronary artery of native heart without angina pectoris - Plan: EKG 12-Lead  Essential hypertension, benign - Plan: EKG 12-Lead  Hyperlipidemia LDL goal <70 - Plan: EKG 12-Lead  CKD (chronic kidney disease) stage 2, GFR 60-89 ml/min - Plan: EKG 12-Lead  BMI 31.0-31.9,adult - Plan: EKG 12-Lead  The patient has progressed to severe symptomatic aortic stenosis stage D.  He has had his CT scan and cardiac catheterization.  He does have moderate proximal LAD disease but he is asymptomatic.  He honestly looks like a candidate for either TAVR or surgical aortic valve replacement however he prefers TAVR.  I think 26 mm SAPIEN 3 valve from the right transfemoral approach would be appropriate unless cardiothoracic  surgical opinion is that CABG and AVR should be pursued.  I spent 40 minutes reviewing all clinical data during and prior to this visit including all relevant imaging studies, laboratories, clinical information from other health systems and prior notes from both Cardiology and other specialties, interviewing the patient, conducting a complete physical examination, and coordinating care in order to formulate a comprehensive and personalized evaluation and treatment plan.   Joesiah Lonon K Jaaliyah Lucatero, MD  11/07/2023 2:20 PM    [facility] [address] [place], [place], [place]  [address] Phone: [phone]; Fax: [phone]

## 2023-11-07 ENCOUNTER — Encounter: Payer: Self-pay | Admitting: Internal Medicine

## 2023-11-07 ENCOUNTER — Ambulatory Visit: Payer: Medicare HMO | Attending: Internal Medicine | Admitting: Internal Medicine

## 2023-11-07 VITALS — BP 122/74 | HR 92 | Ht 67.5 in | Wt 220.4 lb

## 2023-11-07 DIAGNOSIS — N182 Chronic kidney disease, stage 2 (mild): Secondary | ICD-10-CM | POA: Diagnosis not present

## 2023-11-07 DIAGNOSIS — I10 Essential (primary) hypertension: Secondary | ICD-10-CM

## 2023-11-07 DIAGNOSIS — E785 Hyperlipidemia, unspecified: Secondary | ICD-10-CM | POA: Diagnosis not present

## 2023-11-07 DIAGNOSIS — I350 Nonrheumatic aortic (valve) stenosis: Secondary | ICD-10-CM

## 2023-11-07 DIAGNOSIS — I2510 Atherosclerotic heart disease of native coronary artery without angina pectoris: Secondary | ICD-10-CM

## 2023-11-07 DIAGNOSIS — Z6831 Body mass index (BMI) 31.0-31.9, adult: Secondary | ICD-10-CM | POA: Diagnosis not present

## 2023-11-07 NOTE — Progress Notes (Addendum)
 Pre Surgical Assessment: 5 M Walk Test  74M=16.54ft  5 Meter Walk Test- trial 1: 4.8  seconds 5 Meter Walk Test- trial 2: 4.5 seconds 5 Meter Walk Test- trial 3: 4.43 seconds 5 Meter Walk Test Average: 4.58 seconds  ____________________________  STS RISK CALCULATOR:    Procedure Type: Isolated AVR Perioperative OutcomeEstimate % Operative Mortality1.4% Morbidity & Mortality6.77% Stroke0.896% Renal Failure1.06% Reoperation3.85% Prolonged Ventilation3.11% Deep Sternal Wound Infection0.064% Long Hospital Stay (>14 days)2.43% [facility] Stay (<6 days)*59%

## 2023-11-07 NOTE — Patient Instructions (Signed)
 Medication Instructions:  No changes  *If you need a refill on your cardiac medications before your next appointment, please call your pharmacy*   Lab Work: none If you have labs (blood work) drawn today and your tests are completely normal, you will receive your results only by: MyChart Message (if you have MyChart) OR A paper copy in the mail If you have any lab test that is abnormal or we need to change your treatment, we will call you to review the results.   Testing/Procedures: none   Follow-Up: Per Structural Heart Team - we will reach out to schedule a visit with the cardiac surgery team

## 2023-11-20 ENCOUNTER — Other Ambulatory Visit: Payer: Self-pay | Admitting: Family Medicine

## 2023-11-21 DIAGNOSIS — E7800 Pure hypercholesterolemia, unspecified: Secondary | ICD-10-CM | POA: Diagnosis not present

## 2023-11-21 DIAGNOSIS — Z79899 Other long term (current) drug therapy: Secondary | ICD-10-CM | POA: Diagnosis not present

## 2023-11-21 LAB — LIPID PANEL
Chol/HDL Ratio: 3.4 {ratio} (ref 0.0–5.0)
Cholesterol, Total: 162 mg/dL (ref 100–199)
HDL: 48 mg/dL (ref >39)
LDL Chol Calc (NIH): 97 mg/dL (ref 0–99)
Triglycerides: 94 mg/dL (ref 0–149)
VLDL Cholesterol Cal: 17 mg/dL (ref 5–40)

## 2023-11-22 ENCOUNTER — Other Ambulatory Visit: Payer: Self-pay | Admitting: Family Medicine

## 2023-11-25 ENCOUNTER — Encounter (HOSPITAL_COMMUNITY): Payer: Self-pay

## 2023-11-25 ENCOUNTER — Telehealth: Payer: Self-pay

## 2023-11-25 ENCOUNTER — Telehealth: Payer: Self-pay | Admitting: Physician Assistant

## 2023-11-25 DIAGNOSIS — E785 Hyperlipidemia, unspecified: Secondary | ICD-10-CM

## 2023-11-25 DIAGNOSIS — I2510 Atherosclerotic heart disease of native coronary artery without angina pectoris: Secondary | ICD-10-CM

## 2023-11-25 NOTE — Telephone Encounter (Addendum)
  [facility]   MULTIDISCIPLINARY HEART VALVE TEAM  Pt has known severe AS and in the work up for TAVR vs SAVR. 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 His case was discussed with the multidisciplinary valve team on 12/20/23 and there were concerns about hemodynamic significance of his 65% pLAD lesion. FFR was not done at the time. Plan to proceed with PET STRESS to assess for LAD ischemia. The earliest availability is 12/11/23 @ 7:40am. His Dr. Leafy Ro appt on 1/30 has been pushed out to 12/12/23.   Discussed with pt who was understandably frustrated with need for additional testing and moving surgical evaluation out. Emphasized the importance of getting all the necessary information to make an informed decision for treatment of his cardiac issues.    Cline Crock PA-C  [facility]

## 2023-11-25 NOTE — Telephone Encounter (Signed)
-----   Message from Armanda Magic sent at 11/22/2023  1:50 PM EST ----- Lipids still not at goal.  Please forward to lipid clinic for further recommendations.

## 2023-11-25 NOTE — Telephone Encounter (Signed)
Call to patient to advise that per Dr. Mayford Knife, lipids still not at goal. No answer, left detailed message per DPR that Dr. Mayford Knife recommends referral to lipid clinic for further management. Provided education about lipid clinic and advised someone will call to set up an appointment.

## 2023-11-25 NOTE — Telephone Encounter (Addendum)
They had a cancellation for PET stress tomorrow at 10:40am. He has been moved into that spot and apt with Dr. Brennan Bailey moved back to 1/30. Pt very appreciative of call.   Informed Consent   Shared Decision Making/Informed Consent The risks [chest pain, shortness of breath, cardiac arrhythmias, dizziness, blood pressure fluctuations, myocardial infarction, stroke/transient ischemic attack, nausea, vomiting, allergic reaction, radiation exposure, metallic taste sensation and life-threatening complications (estimated to be 1 in 10,000)], benefits (risk stratification, diagnosing coronary artery disease, treatment guidance) and alternatives of a cardiac PET stress test were discussed in detail with [name] and he agrees to proceed.      Cline Crock PA-C  [facility]

## 2023-11-26 ENCOUNTER — Ambulatory Visit (HOSPITAL_COMMUNITY)
Admission: RE | Admit: 2023-11-26 | Discharge: 2023-11-26 | Disposition: A | Discharge status: Home / Self Care | Payer: Medicare HMO | Source: Ambulatory Visit | Attending: Physician Assistant | Admitting: Physician Assistant

## 2023-11-26 DIAGNOSIS — I2510 Atherosclerotic heart disease of native coronary artery without angina pectoris: Secondary | ICD-10-CM | POA: Insufficient documentation

## 2023-11-26 LAB — NM PET CT CARDIAC PERFUSION MULTI W/ABSOLUTE BLOODFLOW
LV dias vol: 158 mL (ref 62–150)
LV sys vol: 74 mL
MBFR: 1.1
Nuc Rest EF: 57 %
Nuc Stress EF: 53 %
Peak HR: 103 {beats}/min
Rest HR: 94 {beats}/min
Rest MBF: 1.16 ml/g/min
Rest Nuclear Isotope Dose: 26 mCi
ST Depression (mm): 0 mm
Stress MBF: 1.28 ml/g/min
Stress Nuclear Isotope Dose: 25.9 mCi
TID: 1.13

## 2023-11-26 MED ORDER — RUBIDIUM RB82 GENERATOR (RUBYFILL)
25.0000 | PACK | Freq: Once | INTRAVENOUS | Status: AC
  Administered 2023-11-26: 25.95 via INTRAVENOUS

## 2023-11-26 MED ORDER — RUBIDIUM RB82 GENERATOR (RUBYFILL)
25.0000 | PACK | Freq: Once | INTRAVENOUS | Status: AC
  Administered 2023-11-26: 1132 via INTRAVENOUS

## 2023-11-26 MED ORDER — REGADENOSON 0.4 MG/5ML IV SOLN
0.4000 mg | Freq: Once | INTRAVENOUS | Status: AC
  Administered 2023-11-26: 0.4 mg via INTRAVENOUS

## 2023-11-26 MED ORDER — REGADENOSON 0.4 MG/5ML IV SOLN
INTRAVENOUS | Status: AC
  Filled 2023-11-26: qty 5

## 2023-11-27 NOTE — Progress Notes (Unsigned)
[address]       [place] [address]             [phone]           Rolfe Hartsell [facility] Medical Record #[id number] Date of Birth: 09-26-1946  Quintella Reichert, MD Judy Pimple, MD  Chief Complaint:   DOE and AS and CAD  History of Present Illness:     78 yo male with increasing DOE and occasional CP. Pt reports that last summer these symptoms had occurred with moderate activity. He had curtailed these activities to feel better but underwent a Coronary CTA last fall with concerns for CAD. Pt underwent cath with moderate LAD/Diag disease and echo with moderate AS with a AVA of 1.1cm2. He was attempted to be enrolled in the PROGRESS trial but with LVOT calcium was not a candidate. Pt was treated medically however pt had ongoing and progressive symptoms to where a repeat echo this past December revealed his AS had progressed to severe with a mean gradient of and an AVA of 1.0cm2. Pt was felt to require AS and PET stress test was performed to try to evaluate his CAD which was felt to be non ischemic. Pt here to discuss options for his AS and CAD      Past Medical History:  Diagnosis Date   Arthritis    LEFT knee   Back pain    Diverticulosis    History of colon polyps    Hyperlipidemia    on meds   Hypertension    on meds   Moderate aortic stenosis    Thyroid disease    on meds    Past Surgical History:  Procedure Laterality Date   COLONOSCOPY  2018   SA-MAC-[id number]-ageq with lavage-tics/int hems/TA x 5 frags   INGUINAL HERNIA REPAIR Bilateral 02/13/2017   Procedure: LAPAROSCOPIC BILATERAL INGUINAL HERNIA REPAIR WITH MESH;  Surgeon: Abigail Miyamoto, MD;  Location: [facility] OR;  Service: General;  Laterality: Bilateral;   LASIK     POLYPECTOMY  2018   TA x 5   RIGHT HEART CATH AND CORONARY ANGIOGRAPHY N/A 07/03/2023   Procedure: RIGHT HEART CATH AND CORONARY ANGIOGRAPHY;  Surgeon: [name], MD;  Location: [facility] INVASIVE CV LAB;   Service: Cardiovascular;  Laterality: N/A;   UMBILICAL HERNIA REPAIR N/A 02/13/2017   Procedure: LAPAROSCOPIC UMBILICAL HERNIA REPAIR;  Surgeon: Abigail Miyamoto, MD;  Location: [facility] OR;  Service: General;  Laterality: N/A;   WISDOM TOOTH EXTRACTION      Social History   Tobacco Use  Smoking Status Never  Smokeless Tobacco Never    Social History   Substance and Sexual Activity  Alcohol Use Yes   Alcohol/week: 21.0 - 28.0 standard drinks of alcohol   Types: 21 - 28 Cans of beer per week   Comment: 3-4 cans of beer daily    Social History   Socioeconomic History   Marital status: Married    Spouse name: Not on file   Number of children: 1   Years of education: Not on file   Highest education level: Associate degree: occupational, Scientist, product/process development, or vocational program  Occupational History    Employer: [employer]  Tobacco Use   Smoking status: Never   Smokeless tobacco: Never  Vaping Use   Vaping status: Never Used  Substance and Sexual Activity   Alcohol use: Yes    Alcohol/week: 21.0 - 28.0 standard drinks of alcohol  Types: 21 - 28 Cans of beer per week    Comment: 3-4 cans of beer daily   Drug use: No   Sexual activity: Yes  Other Topics Concern   Not on file  Social History Narrative   Not on file   Social Drivers of Health   Financial Resource Strain: Low Risk  (07/04/2023)   Overall Financial Resource Strain (CARDIA)    Difficulty of Paying Living Expenses: Not hard at all  Food Insecurity: No Food Insecurity (07/04/2023)   Hunger Vital Sign    Worried About Running Out of Food in the Last Year: Never true    Ran Out of Food in the Last Year: Never true  Transportation Needs: No Transportation Needs (07/04/2023)   PRAPARE - Administrator, Civil Service (Medical): No    Lack of Transportation (Non-Medical): No  Physical Activity: Sufficiently Active (07/04/2023)   Exercise Vital Sign    Days of Exercise per Week: 5 days    Minutes of Exercise per  Session: 60 min  Stress: Not on file  Social Connections: Moderately Isolated (07/04/2023)   Social Connection and Isolation Panel [NHANES]    Frequency of Communication with Friends and Family: Never    Frequency of Social Gatherings with Friends and Family: More than three times a week    Attends Religious Services: Never    Database administrator or Organizations: No    Attends Engineer, structural: Not on file    Marital Status: Married  Catering manager Violence: Not on file    Allergies  Allergen Reactions   Ezetimibe-Simvastatin     REACTION: back pain    Current Outpatient Medications  Medication Sig Dispense Refill   amLODipine (NORVASC) 10 MG tablet TAKE 1 TABLET BY MOUTH EVERY DAY 90 tablet 0   aspirin EC 81 MG tablet Take 1 tablet (81 mg total) by mouth daily. Swallow whole. 90 tablet 3   DOXYLAMINE SUCCINATE, SLEEP, PO Take 50 mg by mouth at bedtime.     ezetimibe (ZETIA) 10 MG tablet Take 1 tablet (10 mg total) by mouth daily. 90 tablet 3   hydrochlorothiazide (HYDRODIURIL) 25 MG tablet Take 1 tablet (25 mg total) by mouth daily. 90 tablet 2   levothyroxine (SYNTHROID) 25 MCG tablet TAKE 1 TABLET BY MOUTH EVERY DAY BEFORE BREAKFAST 90 tablet 1   metoprolol succinate (TOPROL-XL) 50 MG 24 hr tablet TAKE 1 TABLET BY MOUTH DAILY. TAKE WITH OR IMMEDIATELY FOLLOWING A MEAL. 90 tablet 1   rosuvastatin (CRESTOR) 40 MG tablet Take 1 tablet (40 mg total) by mouth daily. 90 tablet 2   No current facility-administered medications for this visit.     Family History  Problem Relation Age of Onset   Cancer Mother        brain tumor   Emphysema Father    Diabetes Father    HIV Brother    Colon cancer Neg Hx    Colon polyps Neg Hx    Esophageal cancer Neg Hx    Rectal cancer Neg Hx    Stomach cancer Neg Hx        Physical Exam: Lungs: Clear Card: RR with harsh systolic murmur Ext: Warm no vericosities Neuro: intact     Diagnostic Studies & Laboratory  data: I have personally reviewed the following studies and agree with the findings   TTE (09/2023) IMPRESSIONS     1. Left ventricular ejection fraction, by estimation, is 65 to 70%. The  left ventricle has normal function. The left ventricle has no regional  wall motion abnormalities. There is moderate left ventricular hypertrophy.  Left ventricular diastolic  parameters are consistent with Grade II diastolic dysfunction  (pseudonormalization).   2. Right ventricular systolic function is normal. The right ventricular  size is normal.   3. Left atrial size was mildly dilated.   4. The mitral valve is normal in structure. Moderate mitral valve  regurgitation. No evidence of mitral stenosis.   5. Tricuspid valve regurgitation is moderate.   6. The aortic valve is calcified. There is severe calcifcation of the  aortic valve. There is severe thickening of the aortic valve. Aortic valve  regurgitation is not visualized. Severe aortic valve stenosis. Aortic  valve area, by VTI measures 1.05 cm.  Aortic valve mean gradient measures 38.7 mmHg. Aortic valve Vmax measures  4.11 m/s.   7. There is borderline dilatation of the ascending aorta, measuring 39  mm.   8. The inferior vena cava is normal in size with greater than 50%  respiratory variability, suggesting right atrial pressure of 3 mmHg.   Comparison(s): Prior images reviewed side by side. 06/27/23: AV mean  gradient 34 mmHg.   FINDINGS   Left Ventricle: Left ventricular ejection fraction, by estimation, is 65  to 70%. The left ventricle has normal function. The left ventricle has no  regional wall motion abnormalities. 3D ejection fraction reviewed and  evaluated as part of the  interpretation. Alternate measurement of EF is felt to be most reflective  of LV function. The left ventricular internal cavity size was normal in  size. There is moderate left ventricular hypertrophy. Left ventricular  diastolic parameters are consistent   with Grade II diastolic dysfunction (pseudonormalization).   Right Ventricle: The right ventricular size is normal. No increase in  right ventricular wall thickness. Right ventricular systolic function is  normal.   Left Atrium: Left atrial size was mildly dilated.   Right Atrium: Right atrial size was normal in size.   Pericardium: There is no evidence of pericardial effusion.   Mitral Valve: The mitral valve is normal in structure. Moderate mitral  valve regurgitation. No evidence of mitral valve stenosis.   Tricuspid Valve: The tricuspid valve is normal in structure. Tricuspid  valve regurgitation is moderate . No evidence of tricuspid stenosis.   Aortic Valve: The aortic valve is calcified. There is severe calcifcation  of the aortic valve. There is severe thickening of the aortic valve.  Aortic valve regurgitation is not visualized. Severe aortic stenosis is  present. Aortic valve mean gradient  measures 38.7 mmHg. Aortic valve peak gradient measures 67.7 mmHg. Aortic  valve area, by VTI measures 1.05 cm.   Pulmonic Valve: The pulmonic valve was normal in structure. Pulmonic valve  regurgitation is not visualized. No evidence of pulmonic stenosis.   Aorta: The aortic root is normal in size and structure. There is  borderline dilatation of the ascending aorta, measuring 39 mm.   Venous: The inferior vena cava is normal in size with greater than 50%  respiratory variability, suggesting right atrial pressure of 3 mmHg.   IAS/Shunts: No atrial level shunt detected by color flow Doppler.     LEFT VENTRICLE  PLAX 2D  LVIDd:         4.25 cm   Diastology  LVIDs:         2.40 cm   LV e' medial:    8.81 cm/s  LV PW:  1.30 cm   LV E/e' medial:  19.1  LV IVS:        1.40 cm   LV e' lateral:   9.14 cm/s  LVOT diam:     2.00 cm   LV E/e' lateral: 18.4  LV SV:         95  LV SV Index:   45  LVOT Area:     3.14 cm                             3D Volume EF:                            3D EF:        56 %                           LV EDV:       201 ml                           LV ESV:       88 ml                           LV SV:        113 ml   RIGHT VENTRICLE  RV Basal diam:  3.30 cm  RV S prime:     15.30 cm/s  TAPSE (M-mode): 3.6 cm   LEFT ATRIUM             Index        RIGHT ATRIUM           Index  LA diam:        4.60 cm 2.18 cm/m   RA Area:     12.50 cm  LA Vol (A2C):   73.6 ml 34.95 ml/m  RA Volume:   22.00 ml  10.45 ml/m  LA Vol (A4C):   83.6 ml 39.70 ml/m  LA Biplane Vol: 78.0 ml 37.04 ml/m   AORTIC VALVE  AV Area (Vmax):    1.15 cm  AV Area (Vmean):   1.18 cm  AV Area (VTI):     1.05 cm  AV Vmax:           411.33 cm/s  AV Vmean:          287.000 cm/s  AV VTI:            0.901 m  AV Peak Grad:      67.7 mmHg  AV Mean Grad:      38.7 mmHg  LVOT Vmax:         150.00 cm/s  LVOT Vmean:        108.000 cm/s  LVOT VTI:          0.302 m  LVOT/AV VTI ratio: 0.34    AORTA  Ao Root diam: 3.30 cm  Ao Asc diam:  3.90 cm   MITRAL VALVE  MV Area (PHT): 4.60 cm     SHUNTS  MV Decel Time: 165 msec     Systemic VTI:  0.30 m  MV E velocity: 168.00 cm/s  Systemic Diam: 2.00 cm  MV A velocity: 117.00 cm/s  MV E/A ratio:  1.44   CATH (06/2023) Conclusion      Dist RCA  lesion is 40% stenosed.   Prox LAD lesion is 65% stenosed.   Lat 1st Diag lesion is 80% stenosed.   1.  Moderate calcified proximal LAD lesion with mild diffuse disease elsewhere. 2.  Separate ostia of LAD and left circumflex arteries. 3.  Fick cardiac output of 11.3 L/min and Fick cardiac index of 5.4 L/min/m with the following hemodynamics:             RA pressure mean of 8 mmHg             RV pressure 41/3 with an end-diastolic pressure of 10 mmHg             Wedge pressure mean of 12 mmHg with V waves to 18 mmHg             PA pressure 42/9 with a mean of 23 mmHg             Pulmonary vascular resistance of 1 Wood unit             Pulmonary artery pulsatility index  of 4 4.  Capacious iliofemoral vessels bilaterally.   5.  Right radial arterial loop requiring coronary guidewire to navigate.    Recent Radiology Findings:   CTA (06/2023) FINDINGS: Aortic Valve:   Tricuspid aortic valve with severely reduced cusp excursion. Severely thickened and severely calcified aortic valve cusps.   AV calcium score: 3022   Virtual Basal Annulus Measurements:   Maximum/Minimum Diameter: 28.7 x 22.2 mm   Perimeter: 79.1 mm   Area:  475 mm2   LVOT calcifications, small protruding calcification below L-N commissure and large protruding calcification below left coronary cusp.   Membranous septal length: 6 mm   Based on these measurements, the annulus would be suitable for a 26 mm Sapien 3 valve. Alternatively, Heart Team can consider 29 mm Evolut valve. Recommend Heart Team discussion for valve selection.   Sinus of Valsalva Measurements:   Non-coronary:  37 mm   Right - coronary:  35 mm   Left - coronary:  37 mm   Sinus of Valsalva Height:   Left: 25 mm   Right: 24 mm   Aorta: Conventional 3 vessel branch pattern of aortic arch.   Sinotubular Junction:  30 mm   Ascending Thoracic Aorta: 39 mm   Aortic Arch:  30 mm   Descending Thoracic Aorta:  29 mm   Coronary Artery Height above Annulus:   Left circumflex/LAD origins: 18 mm   Right coronary: 21 mm   Coronary Arteries: Normal coronary origin. Right dominance. The study was performed without use of NTG and insufficient for plaque evaluation. Coronary artery calcium seen in 3 vessel distribution.   Optimum Fluoroscopic Angle for Delivery: LAO 4 CAU 3   OTHER:   Atria: Moderate LA dilation.  Mild RA dilation.   Left atrial appendage: No thrombus.   Mitral valve: Grossly normal, trivial mitral annular calcifications.   Pulmonary artery: Normal caliber.   Pulmonary veins: Normal anatomy.   IMPRESSION: 1. Tricuspid aortic valve with severely reduced cusp  excursion. Severely thickened and severely calcified aortic valve cusps. 2. Aortic valve calcium score: 3022 3. Annulus area: 475 mm2, suitable for 26 mm Sapien 3 valve. LVOT calcifications, small protruding calcification below L-N commissure and large protruding calcification below left coronary cusp. Membranous septal length 6 mm. 4. Sufficient coronary artery heights from annulus. 5. Optimum fluoroscopic angle for delivery: LAO 4 CAU 3   NM PET CT CARDIAC PERFUSION MULTI W/ABSOLUTE  BLOODFLOW Addendum Date: 11/26/2023   Findings are consistent with no ischemia and no infarction on perfusion images. The study is intermediate risk given presence of transient ischemic dilation, and no augmentation of EF with stress. Reduced MBFR may be secondary to severe aortic valve stenosis contributing to microvascular dysfunction.   Myocardial blood flow was computed to be 1.62ml/g/min at rest and 1.68ml/g/min at stress. Global myocardial blood flow reserve was 1.10. Myocardial blood flow reserve is indeterminate in this patient due to technical or patient-specific concerns that affect accuracy: severe aortic valve stenosis.   LV perfusion is normal. There is no evidence of ischemia. There is no evidence of infarction.   Rest left ventricular function is normal. Rest EF: 57%. Stress left ventricular function is normal. Stress EF: 53%. End diastolic cavity size is moderately enlarged. End systolic cavity size is mildly enlarged. Evidence of transient ischemic dilation (TID) noted.   Coronary calcium was present on the attenuation correction CT images. Severe coronary calcifications were present. Coronary calcifications were present in the left anterior descending artery, left circumflex artery and right coronary artery distribution(s).   Electronically signed by: Parke Poisson, MD CLINICAL DATA:  This over-read does not include interpretation of cardiac or coronary anatomy or pathology. The Cardiac PET CT  interpretation by the cardiologist is attached. COMPARISON:  CT angio chest 07/09/2023 FINDINGS: Vascular: No acute abnormality. Mediastinum/Nodes: No mass or adenopathy identified within the imaged portions of the mediastinum. Lungs/Pleura: No pleural effusion, airspace consolidation or pneumothorax. No suspicious pulmonary nodule or mass identified. Upper Abdomen: No acute abnormality within the imaged portions of the upper abdomen. Hepatic steatosis. Musculoskeletal: No acute or suspicious osseous findings. IMPRESSION: 1. No significant noncardiac supplemental findings identified within the chest. 2. Hepatic steatosis. Electronically Signed   By: Signa Kell M.D.   On: 11/26/2023 13:53   Result Date: 11/26/2023   Findings are consistent with no ischemia and no infarction on perfusion images. The study is intermediate risk given abnormal MBFR, presence of transient ischemic dilation, and no augmentation of EF with stress. Reduced MBFR may be secondary to severe aortic valve stenosis contributing to microvascular dysfunction, or possibly mulitvessel CAD.   Myocardial blood flow was computed to be 1.35ml/g/min at rest and 1.59ml/g/min at stress. Global myocardial blood flow reserve was 1.10. Myocardial blood flow reserve is indeterminate and therefore is not reported in this patient due to technical or patient-specific concerns that affect accuracy.   LV perfusion is normal. There is no evidence of ischemia. There is no evidence of infarction.   Rest left ventricular function is normal. Rest EF: 57%. Stress left ventricular function is normal. Stress EF: 53%. End diastolic cavity size is moderately enlarged. End systolic cavity size is mildly enlarged. Evidence of transient ischemic dilation (TID) noted.   Coronary calcium was present on the attenuation correction CT images. Severe coronary calcifications were present. Coronary calcifications were present in the left anterior descending artery, left circumflex  artery and right coronary artery distribution(s).   Electronically signed by: Parke Poisson, MD CLINICAL DATA:  This over-read does not include interpretation of cardiac or coronary anatomy or pathology. The Cardiac PET CT interpretation by the cardiologist is attached. COMPARISON:  CT angio chest 07/09/2023 FINDINGS: Vascular: No acute abnormality. Mediastinum/Nodes: No mass or adenopathy identified within the imaged portions of the mediastinum. Lungs/Pleura: No pleural effusion, airspace consolidation or pneumothorax. No suspicious pulmonary nodule or mass identified. Upper Abdomen: No acute abnormality within the imaged portions of the upper abdomen. Hepatic  steatosis. Musculoskeletal: No acute or suspicious osseous findings. IMPRESSION: 1. No significant noncardiac supplemental findings identified within the chest. 2. Hepatic steatosis. Electronically Signed   By: Signa Kell M.D.   On: 11/26/2023 13:53     Recent Lab Findings: Lab Results  Component Value Date   WBC 5.8 07/10/2023   HGB 14.5 07/10/2023   HCT 42.3 07/10/2023   PLT 136 (L) 07/10/2023   GLUCOSE 107 (H) 07/10/2023   CHOL 162 11/21/2023   TRIG 94 11/21/2023   HDL 48 11/21/2023   LDLDIRECT 130.0 01/06/2013   LDLCALC 97 11/21/2023   ALT 29 08/13/2023   AST 31 08/13/2023   NA 139 07/10/2023   K 4.2 07/10/2023   CL 99 07/10/2023   CREATININE 1.00 07/10/2023   BUN 19 07/10/2023   CO2 21 07/10/2023   TSH 2.67 03/15/2023   HGBA1C 5.8 07/05/2023      Assessment / Plan:     78 yo male with NYHA class 2-3 symptoms of now severe AS with moderate CAD and normal LV function. Pt has a class I indication for AVR and after a long discussion over his associated CAD, I felt it would probably best to perform SAVR with CABG to LAD/Diag and remove his CAD from his potential symptom complex and have better long term results than TAVR/PCI. Pt and wife understand and wish to proceed on 2/7. All the risks and goals and recovery from  surgery were discussed.    I have spent 60 min in review of the records, viewing studies and in face to face with patient and in coordination of future care    Eugenio Hoes 11/27/2023 12:59 PM

## 2023-11-28 ENCOUNTER — Encounter: Payer: Self-pay | Admitting: *Deleted

## 2023-11-28 ENCOUNTER — Other Ambulatory Visit: Payer: Self-pay | Admitting: *Deleted

## 2023-11-28 ENCOUNTER — Institutional Professional Consult (permissible substitution): Payer: Medicare HMO | Admitting: Thoracic Surgery (Cardiothoracic Vascular Surgery)

## 2023-11-28 ENCOUNTER — Encounter: Payer: Self-pay | Admitting: Thoracic Surgery (Cardiothoracic Vascular Surgery)

## 2023-11-28 ENCOUNTER — Encounter: Payer: Medicare HMO | Admitting: Thoracic Surgery (Cardiothoracic Vascular Surgery)

## 2023-11-28 VITALS — BP 149/76 | HR 92 | Resp 20 | Ht 68.0 in | Wt 226.6 lb

## 2023-11-28 DIAGNOSIS — I25118 Atherosclerotic heart disease of native coronary artery with other forms of angina pectoris: Secondary | ICD-10-CM

## 2023-11-28 DIAGNOSIS — I350 Nonrheumatic aortic (valve) stenosis: Secondary | ICD-10-CM

## 2023-11-28 NOTE — Patient Instructions (Signed)
SAVR with CABx2 on 2/7

## 2023-11-29 ENCOUNTER — Encounter: Payer: Self-pay | Admitting: Family Medicine

## 2023-12-03 NOTE — Pre-Procedure Instructions (Signed)
 Surgical Instructions   Your procedure is scheduled on December 06, 2023. Report to [facility] Main Entrance A at 5:30 A.M., then check in with the Admitting office. Any questions or running late day of surgery: call [phone]  Questions prior to your surgery date: call [phone], Monday-Friday, 8am-4pm. If you experience any cold or flu symptoms such as cough, fever, chills, shortness of breath, etc. between now and your scheduled surgery, please notify us  at the above number.     Remember:  Do not eat or drink after midnight the night before your surgery    Take these medicines the morning of surgery with A SIP OF WATER: levothyroxine  (SYNTHROID )    Continue taking your Aspirin  through the day before surgery. DO NOT take any the morning of surgery.   One week prior to surgery, STOP taking any Aleve, Naproxen, Ibuprofen, Motrin, Advil, Goody's, BC's, all herbal medications, fish oil, and non-prescription vitamins.                     Do NOT Smoke (Tobacco/Vaping) for 24 hours prior to your procedure.  If you use a CPAP at night, you may bring your mask/headgear for your overnight stay.   You will be asked to remove any contacts, glasses, piercing's, hearing aid's, dentures/partials prior to surgery. Please bring cases for these items if needed.    Patients discharged the day of surgery will not be allowed to drive home, and someone needs to stay with them for 24 hours.  SURGICAL WAITING ROOM VISITATION Patients may have no more than 2 support people in the waiting area - these visitors may rotate.   Pre-op nurse will coordinate an appropriate time for 1 ADULT support person, who may not rotate, to accompany patient in pre-op.  Children under the age of 3 must have an adult with them who is not the patient and must remain in the main waiting area with an adult.  If the patient needs to stay at the hospital during part of their recovery, the visitor guidelines for  inpatient rooms apply.  Please refer to the [facility] website for the visitor guidelines for any additional information.   If you received a COVID test during your pre-op visit  it is requested that you wear a mask when out in public, stay away from anyone that may not be feeling well and notify your surgeon if you develop symptoms. If you have been in contact with anyone that has tested positive in the last 10 days please notify you surgeon.      Pre-operative CHG Bathing Instructions   You can play a key role in reducing the risk of infection after surgery. Your skin needs to be as free of germs as possible. You can reduce the number of germs on your skin by washing with CHG (chlorhexidine  gluconate) soap before surgery. CHG is an antiseptic soap that kills germs and continues to kill germs even after washing.   DO NOT use if you have an allergy to chlorhexidine /CHG or antibacterial soaps. If your skin becomes reddened or irritated, stop using the CHG and notify one of our RNs at [phone].              TAKE A SHOWER THE NIGHT BEFORE SURGERY AND THE DAY OF SURGERY    Please keep in mind the following:  DO NOT shave, including legs and underarms, 48 hours prior to surgery.   You may shave your face before/day of surgery.  Place clean sheets on your bed the night before surgery Use a clean washcloth (not used since being washed) for each shower. DO NOT sleep with pet's night before surgery.  CHG Shower Instructions:  Wash your face and private area with normal soap. If you choose to wash your hair, wash first with your normal shampoo.  After you use shampoo/soap, rinse your hair and body thoroughly to remove shampoo/soap residue.  Turn the water OFF and apply half the bottle of CHG soap to a CLEAN washcloth.  Apply CHG soap ONLY FROM YOUR NECK DOWN TO YOUR TOES (washing for 3-5 minutes)  DO NOT use CHG soap on face, private areas, open wounds, or sores.  Pay special attention to  the area where your surgery is being performed.  If you are having back surgery, having someone wash your back for you may be helpful. Wait 2 minutes after CHG soap is applied, then you may rinse off the CHG soap.  Pat dry with a clean towel  Put on clean pajamas    Additional instructions for the day of surgery: DO NOT APPLY any lotions, deodorants, cologne, or perfumes.   Do not wear jewelry or makeup Do not wear nail polish, gel polish, artificial nails, or any other type of covering on natural nails (fingers and toes) Do not bring valuables to the hospital. [facility] is not responsible for valuables/personal belongings. Put on clean/comfortable clothes.  Please brush your teeth.  Ask your nurse before applying any prescription medications to the skin.

## 2023-12-04 ENCOUNTER — Ambulatory Visit (HOSPITAL_COMMUNITY)
Admission: RE | Admit: 2023-12-04 | Discharge: 2023-12-04 | Disposition: A | Discharge status: Home / Self Care | Payer: Medicare HMO | Source: Ambulatory Visit | Attending: Thoracic Surgery (Cardiothoracic Vascular Surgery)

## 2023-12-04 ENCOUNTER — Other Ambulatory Visit (HOSPITAL_COMMUNITY): Payer: Medicare HMO

## 2023-12-04 ENCOUNTER — Ambulatory Visit (HOSPITAL_BASED_OUTPATIENT_CLINIC_OR_DEPARTMENT_OTHER)
Admission: RE | Admit: 2023-12-04 | Discharge: 2023-12-04 | Disposition: A | Discharge status: Home / Self Care | Payer: Medicare HMO | Source: Ambulatory Visit | Attending: Thoracic Surgery (Cardiothoracic Vascular Surgery) | Admitting: Thoracic Surgery (Cardiothoracic Vascular Surgery)

## 2023-12-04 ENCOUNTER — Other Ambulatory Visit: Payer: Self-pay

## 2023-12-04 ENCOUNTER — Encounter (HOSPITAL_COMMUNITY)
Admission: RE | Admit: 2023-12-04 | Discharge: 2023-12-04 | Disposition: A | Discharge status: Home / Self Care | Payer: Medicare HMO | Source: Ambulatory Visit | Attending: Thoracic Surgery (Cardiothoracic Vascular Surgery) | Admitting: Thoracic Surgery (Cardiothoracic Vascular Surgery)

## 2023-12-04 ENCOUNTER — Encounter (HOSPITAL_COMMUNITY): Payer: Self-pay

## 2023-12-04 VITALS — BP 142/81 | HR 91 | Temp 97.6°F | Resp 18 | Ht 68.0 in | Wt 221.7 lb

## 2023-12-04 DIAGNOSIS — I350 Nonrheumatic aortic (valve) stenosis: Secondary | ICD-10-CM | POA: Insufficient documentation

## 2023-12-04 DIAGNOSIS — I25118 Atherosclerotic heart disease of native coronary artery with other forms of angina pectoris: Secondary | ICD-10-CM | POA: Insufficient documentation

## 2023-12-04 DIAGNOSIS — Z01818 Encounter for other preprocedural examination: Secondary | ICD-10-CM | POA: Insufficient documentation

## 2023-12-04 DIAGNOSIS — I700 Atherosclerosis of aorta: Secondary | ICD-10-CM | POA: Diagnosis not present

## 2023-12-04 HISTORY — DX: Hypothyroidism, unspecified: E03.9

## 2023-12-04 HISTORY — DX: Atherosclerotic heart disease of native coronary artery without angina pectoris: I25.10

## 2023-12-04 HISTORY — DX: Prediabetes: R73.03

## 2023-12-04 LAB — COMPREHENSIVE METABOLIC PANEL
ALT: 48 U/L — ABNORMAL HIGH (ref 0–44)
AST: 47 U/L — ABNORMAL HIGH (ref 15–41)
Albumin: 4.4 g/dL (ref 3.5–5.0)
Alkaline Phosphatase: 62 U/L (ref 38–126)
Anion gap: 12 (ref 5–15)
BUN: 18 mg/dL (ref 8–23)
CO2: 26 mmol/L (ref 22–32)
Calcium: 9.5 mg/dL (ref 8.9–10.3)
Chloride: 100 mmol/L (ref 98–111)
Creatinine, Ser: 0.97 mg/dL (ref 0.61–1.24)
GFR, Estimated: >60 mL/min (ref >60)
Glucose, Bld: 186 mg/dL — ABNORMAL HIGH (ref 70–99)
Potassium: 3.8 mmol/L (ref 3.5–5.1)
Sodium: 138 mmol/L (ref 135–145)
Total Bilirubin: 1.1 mg/dL (ref 0.0–1.2)
Total Protein: 7.4 g/dL (ref 6.5–8.1)

## 2023-12-04 LAB — CBC
HCT: 39 % (ref 39.0–52.0)
Hemoglobin: 13.8 g/dL (ref 13.0–17.0)
MCH: 31.9 pg (ref 26.0–34.0)
MCHC: 35.4 g/dL (ref 30.0–36.0)
MCV: 90.1 fL (ref 80.0–100.0)
Platelets: 159 10*3/uL (ref 150–400)
RBC: 4.33 MIL/uL (ref 4.22–5.81)
RDW: 13 % (ref 11.5–15.5)
WBC: 6.7 10*3/uL (ref 4.0–10.5)
nRBC: 0 % (ref 0.0–0.2)

## 2023-12-04 LAB — HEMOGLOBIN A1C
Hgb A1c MFr Bld: 5.7 % — ABNORMAL HIGH (ref 4.8–5.6)
Mean Plasma Glucose: 116.89 mg/dL

## 2023-12-04 LAB — URINALYSIS, ROUTINE W REFLEX MICROSCOPIC
Bilirubin Urine: NEGATIVE
Glucose, UA: NEGATIVE mg/dL
Hgb urine dipstick: NEGATIVE
Ketones, ur: NEGATIVE mg/dL
Leukocytes,Ua: NEGATIVE
Nitrite: NEGATIVE
Protein, ur: NEGATIVE mg/dL
Specific Gravity, Urine: 1.019 (ref 1.005–1.030)
pH: 5 (ref 5.0–8.0)

## 2023-12-04 LAB — APTT: aPTT: 33 s (ref 24–36)

## 2023-12-04 LAB — PROTIME-INR
INR: 1 (ref 0.8–1.2)
Prothrombin Time: 13.8 s (ref 11.4–15.2)

## 2023-12-04 LAB — SURGICAL PCR SCREEN
MRSA, PCR: NEGATIVE
Staphylococcus aureus: POSITIVE — AB

## 2023-12-04 NOTE — Progress Notes (Signed)
 PCP - Dr. Laine Balls Cardiologist - Dr. Wilbert Bihari - Last office visit 11/07/2023  PPM/ICD - Denies Device Orders - n/a Rep Notified - n/a  Chest x-ray - 12/04/2023 EKG - 11/07/2023 Stress Test - 11/26/2023 ECHO - 10/25/2023 Cardiac Cath - 07/03/2023  Sleep Study - Denies CPAP - n/a  No DM  Last dose of GLP1 agonist- n/a GLP1 instructions: n/a  Blood Thinner Instructions: n/a Aspirin  Instructions: Pt instructed to continue taking ASA through day before surgery and NONE morning of surgery  NPO after midnight  COVID TEST- n/a   Anesthesia review: No  Patient denies shortness of breath, fever, cough and chest pain at PAT appointment. Pt denies any respiratory illness/infection in the last two months.   All instructions explained to the patient, with a verbal understanding of the material. Patient agrees to go over the instructions while at home for a better understanding. Patient also instructed to self quarantine after being tested for COVID-19. The opportunity to ask questions was provided.

## 2023-12-04 NOTE — Progress Notes (Signed)
 Pre-AVR testing has been completed. Preliminary results can be found in CV Proc through chart review.   12/04/23 11:21 AM Birda Buffy RVT

## 2023-12-05 ENCOUNTER — Other Ambulatory Visit: Payer: Self-pay | Admitting: Family Medicine

## 2023-12-05 MED ORDER — MANNITOL 20 % IV SOLN
INTRAVENOUS | Status: DC
  Filled 2023-12-05: qty 13

## 2023-12-05 MED ORDER — TRANEXAMIC ACID (OHS) PUMP PRIME SOLUTION
2.0000 mg/kg | INTRAVENOUS | Status: DC
  Filled 2023-12-05: qty 2.01

## 2023-12-05 MED ORDER — SODIUM CHLORIDE 0.9 % IV SOLN
1.5000 mg/kg/h | INTRAVENOUS | Status: AC
  Administered 2023-12-06: 1.5 mg/kg/h via INTRAVENOUS
  Filled 2023-12-05: qty 25

## 2023-12-05 MED ORDER — PHENYLEPHRINE HCL-NACL 20-0.9 MG/250ML-% IV SOLN
30.0000 ug/min | INTRAVENOUS | Status: AC
  Administered 2023-12-06: 25 ug/min via INTRAVENOUS
  Filled 2023-12-05: qty 250

## 2023-12-05 MED ORDER — EPINEPHRINE HCL 5 MG/250ML IV SOLN IN NS
0.0000 ug/min | INTRAVENOUS | Status: DC
  Filled 2023-12-05: qty 250

## 2023-12-05 MED ORDER — CEFAZOLIN SODIUM-DEXTROSE 2-4 GM/100ML-% IV SOLN
2.0000 g | INTRAVENOUS | Status: AC
  Administered 2023-12-06: 2 g via INTRAVENOUS
  Filled 2023-12-05: qty 100

## 2023-12-05 MED ORDER — NITROGLYCERIN IN D5W 200-5 MCG/ML-% IV SOLN
2.0000 ug/min | INTRAVENOUS | Status: DC
  Filled 2023-12-05: qty 250

## 2023-12-05 MED ORDER — DEXMEDETOMIDINE HCL IN NACL 400 MCG/100ML IV SOLN
0.1000 ug/kg/h | INTRAVENOUS | Status: AC
  Administered 2023-12-06: .5 ug/kg/h via INTRAVENOUS
  Filled 2023-12-05: qty 100

## 2023-12-05 MED ORDER — PLASMA-LYTE A IV SOLN
INTRAVENOUS | Status: DC
  Filled 2023-12-05: qty 2.5

## 2023-12-05 MED ORDER — VANCOMYCIN HCL 1.5 G IV SOLR
1500.0000 mg | INTRAVENOUS | Status: AC
  Administered 2023-12-06: 1500 mg via INTRAVENOUS
  Filled 2023-12-05: qty 30

## 2023-12-05 MED ORDER — MILRINONE LACTATE IN DEXTROSE 20-5 MG/100ML-% IV SOLN
0.3000 ug/kg/min | INTRAVENOUS | Status: DC
  Filled 2023-12-05: qty 100

## 2023-12-05 MED ORDER — NOREPINEPHRINE 4 MG/250ML-% IV SOLN
0.0000 ug/min | INTRAVENOUS | Status: DC
  Filled 2023-12-05: qty 250

## 2023-12-05 MED ORDER — HEPARIN 30,000 UNITS/1000 ML (OHS) CELLSAVER SOLUTION
Status: DC
  Filled 2023-12-05: qty 1000

## 2023-12-05 MED ORDER — TRANEXAMIC ACID (OHS) BOLUS VIA INFUSION
15.0000 mg/kg | INTRAVENOUS | Status: AC
  Administered 2023-12-06: 1509 mg via INTRAVENOUS
  Filled 2023-12-05: qty 1509

## 2023-12-05 MED ORDER — VANCOMYCIN HCL 1000 MG IV SOLR
INTRAVENOUS | Status: DC
  Filled 2023-12-05: qty 20

## 2023-12-05 MED ORDER — INSULIN REGULAR(HUMAN) IN NACL 100-0.9 UT/100ML-% IV SOLN
INTRAVENOUS | Status: AC
  Administered 2023-12-06: 1.4 [IU]/h via INTRAVENOUS
  Filled 2023-12-05: qty 100

## 2023-12-05 MED ORDER — POTASSIUM CHLORIDE 2 MEQ/ML IV SOLN
80.0000 meq | INTRAVENOUS | Status: DC
  Filled 2023-12-05: qty 40

## 2023-12-05 NOTE — H&P (Signed)
 [address]       [place] [address]             [phone]                                   [name] [facility] Medical Record #[id number] Date of Birth: [birth year]   Luis Wilbert SAUNDERS, MD [name] LABOR, MD   Chief Complaint:   DOE and AS and CAD   History of Present Illness:     78 yo male with increasing DOE and occasional CP. Pt reports that last summer these symptoms had occurred with moderate activity. He had curtailed these activities to feel better but underwent a Coronary CTA last fall with concerns for CAD. Pt underwent cath with moderate LAD/Diag disease and echo with moderate AS with a AVA of 1.1cm2. He was attempted to be enrolled in the PROGRESS trial but with LVOT calcium  was not a candidate. Pt was treated medically however pt had ongoing and progressive symptoms to where a repeat echo this past December revealed his AS had progressed to severe with a mean gradient of and an AVA of 1.0cm2. Pt was felt to require AS and PET stress test was performed to try to evaluate his CAD which was felt to be non ischemic. Pt here to discuss options for his AS and CAD             Past Medical History:  Diagnosis Date   Arthritis      LEFT knee   Back pain     Diverticulosis     History of colon polyps     Hyperlipidemia      on meds   Hypertension      on meds   Moderate aortic stenosis     Thyroid  disease      on meds               Past Surgical History:  Procedure Laterality Date   COLONOSCOPY   2018    SA-MAC-[id number]-ageq with lavage-tics/int hems/TA x 5 frags   INGUINAL HERNIA REPAIR Bilateral 02/13/2017    Procedure: LAPAROSCOPIC BILATERAL INGUINAL HERNIA REPAIR WITH MESH;  Surgeon: Luis Poli, MD;  Location: [facility] OR;  Service: General;  Laterality: Bilateral;   LASIK       POLYPECTOMY   2018    TA x 5   RIGHT HEART CATH AND CORONARY ANGIOGRAPHY N/A 07/03/2023    Procedure: RIGHT HEART CATH AND CORONARY ANGIOGRAPHY;   Surgeon: Luis Lurena POUR, MD;  Location: [facility] INVASIVE CV LAB;  Service: Cardiovascular;  Laterality: N/A;   UMBILICAL HERNIA REPAIR N/A 02/13/2017    Procedure: LAPAROSCOPIC UMBILICAL HERNIA REPAIR;  Surgeon: Luis Poli, MD;  Location: [facility] OR;  Service: General;  Laterality: N/A;   WISDOM TOOTH EXTRACTION              Tobacco Use History  Social History       Tobacco Use  Smoking Status Never  Smokeless Tobacco Never      Social History        Substance and Sexual Activity  Alcohol Use Yes   Alcohol/week: 21.0 - 28.0 standard drinks of alcohol   Types: 21 - 28 Cans of beer per week    Comment: 3-4 cans of beer daily      Social History  Socioeconomic History   Marital status: Married      Spouse name: Not on file   Number of children: 1   Years of education: Not on file   Highest education level: Associate degree: occupational, scientist, product/process development, or vocational program  Occupational History      Employer: [employer]  Tobacco Use   Smoking status: Never   Smokeless tobacco: Never  Vaping Use   Vaping status: Never Used  Substance and Sexual Activity   Alcohol use: Yes      Alcohol/week: 21.0 - 28.0 standard drinks of alcohol      Types: 21 - 28 Cans of beer per week      Comment: 3-4 cans of beer daily   Drug use: No   Sexual activity: Yes  Other Topics Concern   Not on file  Social History Narrative   Not on file    Social Drivers of Health        Financial Resource Strain: Low Risk  (07/04/2023)    Overall Financial Resource Strain (CARDIA)     Difficulty of Paying Living Expenses: Not hard at all  Food Insecurity: No Food Insecurity (07/04/2023)    Hunger Vital Sign     Worried About Running Out of Food in the Last Year: Never true     Ran Out of Food in the Last Year: Never true  Transportation Needs: No Transportation Needs (07/04/2023)    PRAPARE - Therapist, Art (Medical): No     Lack of Transportation (Non-Medical):  No  Physical Activity: Sufficiently Active (07/04/2023)    Exercise Vital Sign     Days of Exercise per Week: 5 days     Minutes of Exercise per Session: 60 min  Stress: Not on file  Social Connections: Moderately Isolated (07/04/2023)    Social Connection and Isolation Panel [NHANES]     Frequency of Communication with Friends and Family: Never     Frequency of Social Gatherings with Friends and Family: More than three times a week     Attends Religious Services: Never     Database Administrator or Organizations: No     Attends Engineer, Structural: Not on file     Marital Status: Married  Catering Manager Violence: Not on file      Allergies       Allergies  Allergen Reactions   Ezetimibe -Simvastatin        REACTION: back pain              Current Outpatient Medications  Medication Sig Dispense Refill   amLODipine  (NORVASC ) 10 MG tablet TAKE 1 TABLET BY MOUTH EVERY DAY 90 tablet 0   aspirin  EC 81 MG tablet Take 1 tablet (81 mg total) by mouth daily. Swallow whole. 90 tablet 3   DOXYLAMINE SUCCINATE, SLEEP, PO Take 50 mg by mouth at bedtime.       ezetimibe  (ZETIA ) 10 MG tablet Take 1 tablet (10 mg total) by mouth daily. 90 tablet 3   hydrochlorothiazide  (HYDRODIURIL ) 25 MG tablet Take 1 tablet (25 mg total) by mouth daily. 90 tablet 2   levothyroxine  (SYNTHROID ) 25 MCG tablet TAKE 1 TABLET BY MOUTH EVERY DAY BEFORE BREAKFAST 90 tablet 1   metoprolol  succinate (TOPROL -XL) 50 MG 24 hr tablet TAKE 1 TABLET BY MOUTH DAILY. TAKE WITH OR IMMEDIATELY FOLLOWING A MEAL. 90 tablet 1   rosuvastatin  (CRESTOR ) 40 MG tablet Take 1 tablet (40 mg total) by  mouth daily. 90 tablet 2      No current facility-administered medications for this visit.               Family History  Problem Relation Age of Onset   Cancer Mother          brain tumor   Emphysema Father     Diabetes Father     HIV Brother     Colon cancer Neg Hx     Colon polyps Neg Hx     Esophageal cancer Neg Hx      Rectal cancer Neg Hx     Stomach cancer Neg Hx                  Physical Exam: Lungs: Clear Card: RR with harsh systolic murmur Ext: Warm no vericosities Neuro: intact         Diagnostic Studies & Laboratory data: I have personally reviewed the following studies and agree with the findings   TTE (09/2023) IMPRESSIONS     1. Left ventricular ejection fraction, by estimation, is 65 to 70%. The  left ventricle has normal function. The left ventricle has no regional  wall motion abnormalities. There is moderate left ventricular hypertrophy.  Left ventricular diastolic  parameters are consistent with Grade II diastolic dysfunction  (pseudonormalization).   2. Right ventricular systolic function is normal. The right ventricular  size is normal.   3. Left atrial size was mildly dilated.   4. The mitral valve is normal in structure. Moderate mitral valve  regurgitation. No evidence of mitral stenosis.   5. Tricuspid valve regurgitation is moderate.   6. The aortic valve is calcified. There is severe calcifcation of the  aortic valve. There is severe thickening of the aortic valve. Aortic valve  regurgitation is not visualized. Severe aortic valve stenosis. Aortic  valve area, by VTI measures 1.05 cm.  Aortic valve mean gradient measures 38.7 mmHg. Aortic valve Vmax measures  4.11 m/s.   7. There is borderline dilatation of the ascending aorta, measuring 39  mm.   8. The inferior vena cava is normal in size with greater than 50%  respiratory variability, suggesting right atrial pressure of 3 mmHg.   Comparison(s): Prior images reviewed side by side. 06/27/23: AV mean  gradient 34 mmHg.   FINDINGS   Left Ventricle: Left ventricular ejection fraction, by estimation, is 65  to 70%. The left ventricle has normal function. The left ventricle has no  regional wall motion abnormalities. 3D ejection fraction reviewed and  evaluated as part of the  interpretation. Alternate  measurement of EF is felt to be most reflective  of LV function. The left ventricular internal cavity size was normal in  size. There is moderate left ventricular hypertrophy. Left ventricular  diastolic parameters are consistent  with Grade II diastolic dysfunction (pseudonormalization).   Right Ventricle: The right ventricular size is normal. No increase in  right ventricular wall thickness. Right ventricular systolic function is  normal.   Left Atrium: Left atrial size was mildly dilated.   Right Atrium: Right atrial size was normal in size.   Pericardium: There is no evidence of pericardial effusion.   Mitral Valve: The mitral valve is normal in structure. Moderate mitral  valve regurgitation. No evidence of mitral valve stenosis.   Tricuspid Valve: The tricuspid valve is normal in structure. Tricuspid  valve regurgitation is moderate . No evidence of tricuspid stenosis.   Aortic Valve: The aortic valve is calcified. There  is severe calcifcation  of the aortic valve. There is severe thickening of the aortic valve.  Aortic valve regurgitation is not visualized. Severe aortic stenosis is  present. Aortic valve mean gradient  measures 38.7 mmHg. Aortic valve peak gradient measures 67.7 mmHg. Aortic  valve area, by VTI measures 1.05 cm.   Pulmonic Valve: The pulmonic valve was normal in structure. Pulmonic valve  regurgitation is not visualized. No evidence of pulmonic stenosis.   Aorta: The aortic root is normal in size and structure. There is  borderline dilatation of the ascending aorta, measuring 39 mm.   Venous: The inferior vena cava is normal in size with greater than 50%  respiratory variability, suggesting right atrial pressure of 3 mmHg.   IAS/Shunts: No atrial level shunt detected by color flow Doppler.     LEFT VENTRICLE  PLAX 2D  LVIDd:         4.25 cm   Diastology  LVIDs:         2.40 cm   LV e' medial:    8.81 cm/s  LV PW:         1.30 cm   LV E/e' medial:   19.1  LV IVS:        1.40 cm   LV e' lateral:   9.14 cm/s  LVOT diam:     2.00 cm   LV E/e' lateral: 18.4  LV SV:         95  LV SV Index:   45  LVOT Area:     3.14 cm                             3D Volume EF:                           3D EF:        56 %                           LV EDV:       201 ml                           LV ESV:       88 ml                           LV SV:        113 ml   RIGHT VENTRICLE  RV Basal diam:  3.30 cm  RV S prime:     15.30 cm/s  TAPSE (M-mode): 3.6 cm   LEFT ATRIUM             Index        RIGHT ATRIUM           Index  LA diam:        4.60 cm 2.18 cm/m   RA Area:     12.50 cm  LA Vol (A2C):   73.6 ml 34.95 ml/m  RA Volume:   22.00 ml  10.45 ml/m  LA Vol (A4C):   83.6 ml 39.70 ml/m  LA Biplane Vol: 78.0 ml 37.04 ml/m   AORTIC VALVE  AV Area (Vmax):    1.15 cm  AV Area (Vmean):   1.18 cm  AV Area (VTI):     1.05  cm  AV Vmax:           411.33 cm/s  AV Vmean:          287.000 cm/s  AV VTI:            0.901 m  AV Peak Grad:      67.7 mmHg  AV Mean Grad:      38.7 mmHg  LVOT Vmax:         150.00 cm/s  LVOT Vmean:        108.000 cm/s  LVOT VTI:          0.302 m  LVOT/AV VTI ratio: 0.34    AORTA  Ao Root diam: 3.30 cm  Ao Asc diam:  3.90 cm   MITRAL VALVE  MV Area (PHT): 4.60 cm     SHUNTS  MV Decel Time: 165 msec     Systemic VTI:  0.30 m  MV E velocity: 168.00 cm/s  Systemic Diam: 2.00 cm  MV A velocity: 117.00 cm/s  MV E/A ratio:  1.44    CATH (06/2023) Conclusion       Dist RCA lesion is 40% stenosed.   Prox LAD lesion is 65% stenosed.   Lat 1st Diag lesion is 80% stenosed.   1.  Moderate calcified proximal LAD lesion with mild diffuse disease elsewhere. 2.  Separate ostia of LAD and left circumflex arteries. 3.  Fick cardiac output of 11.3 L/min and Fick cardiac index of 5.4 L/min/m with the following hemodynamics:             RA pressure mean of 8 mmHg             RV pressure 41/3 with an end-diastolic pressure of  10 mmHg             Wedge pressure mean of 12 mmHg with V waves to 18 mmHg             PA pressure 42/9 with a mean of 23 mmHg             Pulmonary vascular resistance of 1 Wood unit             Pulmonary artery pulsatility index of 4 4.  Capacious iliofemoral vessels bilaterally.   5.  Right radial arterial loop requiring coronary guidewire to navigate.    Recent Radiology Findings:    CTA (06/2023) FINDINGS: Aortic Valve:   Tricuspid aortic valve with severely reduced cusp excursion. Severely thickened and severely calcified aortic valve cusps.   AV calcium  score: 3022   Virtual Basal Annulus Measurements:   Maximum/Minimum Diameter: 28.7 x 22.2 mm   Perimeter: 79.1 mm   Area:  475 mm2   LVOT calcifications, small protruding calcification below L-N commissure and large protruding calcification below left coronary cusp.   Membranous septal length: 6 mm   Based on these measurements, the annulus would be suitable for a 26 mm Sapien 3 valve. Alternatively, Heart Team can consider 29 mm Evolut valve. Recommend Heart Team discussion for valve selection.   Sinus of Valsalva Measurements:   Non-coronary:  37 mm   Right - coronary:  35 mm   Left - coronary:  37 mm   Sinus of Valsalva Height:   Left: 25 mm   Right: 24 mm   Aorta: Conventional 3 vessel branch pattern of aortic arch.   Sinotubular Junction:  30 mm   Ascending Thoracic Aorta: 39 mm   Aortic Arch:  30 mm  Descending Thoracic Aorta:  29 mm   Coronary Artery Height above Annulus:   Left circumflex/LAD origins: 18 mm   Right coronary: 21 mm   Coronary Arteries: Normal coronary origin. Right dominance. The study was performed without use of NTG and insufficient for plaque evaluation. Coronary artery calcium  seen in 3 vessel distribution.   Optimum Fluoroscopic Angle for Delivery: LAO 4 CAU 3   OTHER:   Atria: Moderate LA dilation.  Mild RA dilation.   Left atrial appendage: No  thrombus.   Mitral valve: Grossly normal, trivial mitral annular calcifications.   Pulmonary artery: Normal caliber.   Pulmonary veins: Normal anatomy.   IMPRESSION: 1. Tricuspid aortic valve with severely reduced cusp excursion. Severely thickened and severely calcified aortic valve cusps. 2. Aortic valve calcium  score: 3022 3. Annulus area: 475 mm2, suitable for 26 mm Sapien 3 valve. LVOT calcifications, small protruding calcification below L-N commissure and large protruding calcification below left coronary cusp. Membranous septal length 6 mm. 4. Sufficient coronary artery heights from annulus. 5. Optimum fluoroscopic angle for delivery: LAO 4 CAU 3    Imaging Results (Last 48 hours)  NM PET CT CARDIAC PERFUSION MULTI W/ABSOLUTE BLOODFLOW Addendum Date: 11/26/2023   Findings are consistent with no ischemia and no infarction on perfusion images. The study is intermediate risk given presence of transient ischemic dilation, and no augmentation of EF with stress. Reduced MBFR may be secondary to severe aortic valve stenosis contributing to microvascular dysfunction.   Myocardial blood flow was computed to be 1.59ml/g/min at rest and 1.28ml/g/min at stress. Global myocardial blood flow reserve was 1.10. Myocardial blood flow reserve is indeterminate in this patient due to technical or patient-specific concerns that affect accuracy: severe aortic valve stenosis.   LV perfusion is normal. There is no evidence of ischemia. There is no evidence of infarction.   Rest left ventricular function is normal. Rest EF: 57%. Stress left ventricular function is normal. Stress EF: 53%. End diastolic cavity size is moderately enlarged. End systolic cavity size is mildly enlarged. Evidence of transient ischemic dilation (TID) noted.   Coronary calcium  was present on the attenuation correction CT images. Severe coronary calcifications were present. Coronary calcifications were present in the left anterior  descending artery, left circumflex artery and right coronary artery distribution(s).   Electronically signed by: Soyla DELENA Merck, MD CLINICAL DATA:  This over-read does not include interpretation of cardiac or coronary anatomy or pathology. The Cardiac PET CT interpretation by the cardiologist is attached. COMPARISON:  CT angio chest 07/09/2023 FINDINGS: Vascular: No acute abnormality. Mediastinum/Nodes: No mass or adenopathy identified within the imaged portions of the mediastinum. Lungs/Pleura: No pleural effusion, airspace consolidation or pneumothorax. No suspicious pulmonary nodule or mass identified. Upper Abdomen: No acute abnormality within the imaged portions of the upper abdomen. Hepatic steatosis. Musculoskeletal: No acute or suspicious osseous findings. IMPRESSION: 1. No significant noncardiac supplemental findings identified within the chest. 2. Hepatic steatosis. Electronically Signed   By: Waddell Calk M.D.   On: 11/26/2023 13:53    Result Date: 11/26/2023   Findings are consistent with no ischemia and no infarction on perfusion images. The study is intermediate risk given abnormal MBFR, presence of transient ischemic dilation, and no augmentation of EF with stress. Reduced MBFR may be secondary to severe aortic valve stenosis contributing to microvascular dysfunction, or possibly mulitvessel CAD.   Myocardial blood flow was computed to be 1.48ml/g/min at rest and 1.28ml/g/min at stress. Global myocardial blood flow reserve was 1.10. Myocardial blood flow  reserve is indeterminate and therefore is not reported in this patient due to technical or patient-specific concerns that affect accuracy.   LV perfusion is normal. There is no evidence of ischemia. There is no evidence of infarction.   Rest left ventricular function is normal. Rest EF: 57%. Stress left ventricular function is normal. Stress EF: 53%. End diastolic cavity size is moderately enlarged. End systolic cavity size is mildly enlarged.  Evidence of transient ischemic dilation (TID) noted.   Coronary calcium  was present on the attenuation correction CT images. Severe coronary calcifications were present. Coronary calcifications were present in the left anterior descending artery, left circumflex artery and right coronary artery distribution(s).   Electronically signed by: Soyla DELENA Merck, MD CLINICAL DATA:  This over-read does not include interpretation of cardiac or coronary anatomy or pathology. The Cardiac PET CT interpretation by the cardiologist is attached. COMPARISON:  CT angio chest 07/09/2023 FINDINGS: Vascular: No acute abnormality. Mediastinum/Nodes: No mass or adenopathy identified within the imaged portions of the mediastinum. Lungs/Pleura: No pleural effusion, airspace consolidation or pneumothorax. No suspicious pulmonary nodule or mass identified. Upper Abdomen: No acute abnormality within the imaged portions of the upper abdomen. Hepatic steatosis. Musculoskeletal: No acute or suspicious osseous findings. IMPRESSION: 1. No significant noncardiac supplemental findings identified within the chest. 2. Hepatic steatosis. Electronically Signed   By: Waddell Calk M.D.   On: 11/26/2023 13:53        Recent Lab Findings: Recent Labs       Lab Results  Component Value Date    WBC 5.8 07/10/2023    HGB 14.5 07/10/2023    HCT 42.3 07/10/2023    PLT 136 (L) 07/10/2023    GLUCOSE 107 (H) 07/10/2023    CHOL 162 11/21/2023    TRIG 94 11/21/2023    HDL 48 11/21/2023    LDLDIRECT 130.0 01/06/2013    LDLCALC 97 11/21/2023    ALT 29 08/13/2023    AST 31 08/13/2023    NA 139 07/10/2023    K 4.2 07/10/2023    CL 99 07/10/2023    CREATININE 1.00 07/10/2023    BUN 19 07/10/2023    CO2 21 07/10/2023    TSH 2.67 03/15/2023    HGBA1C 5.8 07/05/2023            Assessment / Plan:     78 yo male with NYHA class 2-3 symptoms of now severe AS with moderate CAD and normal LV function. Pt has a class I indication for AVR and  after a long discussion over his associated CAD, I felt it would probably best to perform SAVR with CABG to LAD/Diag and remove his CAD from his potential symptom complex and have better long term results than TAVR/PCI. Pt and wife understand and wish to proceed on 2/7. All the risks and goals and recovery from surgery were discussed.

## 2023-12-06 ENCOUNTER — Encounter (HOSPITAL_COMMUNITY): Payer: Self-pay | Admitting: Thoracic Surgery (Cardiothoracic Vascular Surgery)

## 2023-12-06 ENCOUNTER — Encounter (HOSPITAL_COMMUNITY)
Admission: RE | Disposition: A | Discharge status: Home / Self Care | Payer: Self-pay | Source: Home / Self Care | Attending: Thoracic Surgery (Cardiothoracic Vascular Surgery)

## 2023-12-06 ENCOUNTER — Inpatient Hospital Stay (HOSPITAL_COMMUNITY): Payer: Medicare HMO

## 2023-12-06 ENCOUNTER — Inpatient Hospital Stay (HOSPITAL_COMMUNITY)
Admission: RE | Admit: 2023-12-06 | Discharge: 2023-12-12 | DRG: 220 | Disposition: A | Discharge status: Home / Self Care | Payer: Medicare HMO | Attending: Thoracic Surgery (Cardiothoracic Vascular Surgery) | Admitting: Thoracic Surgery (Cardiothoracic Vascular Surgery)

## 2023-12-06 ENCOUNTER — Other Ambulatory Visit: Payer: Self-pay

## 2023-12-06 DIAGNOSIS — Z79890 Hormone replacement therapy (postmenopausal): Secondary | ICD-10-CM | POA: Diagnosis not present

## 2023-12-06 DIAGNOSIS — Z794 Long term (current) use of insulin: Secondary | ICD-10-CM

## 2023-12-06 DIAGNOSIS — R739 Hyperglycemia, unspecified: Secondary | ICD-10-CM | POA: Diagnosis not present

## 2023-12-06 DIAGNOSIS — I2510 Atherosclerotic heart disease of native coronary artery without angina pectoris: Secondary | ICD-10-CM

## 2023-12-06 DIAGNOSIS — E878 Other disorders of electrolyte and fluid balance, not elsewhere classified: Secondary | ICD-10-CM | POA: Diagnosis not present

## 2023-12-06 DIAGNOSIS — E1165 Type 2 diabetes mellitus with hyperglycemia: Secondary | ICD-10-CM | POA: Diagnosis present

## 2023-12-06 DIAGNOSIS — I10 Essential (primary) hypertension: Secondary | ICD-10-CM

## 2023-12-06 DIAGNOSIS — I350 Nonrheumatic aortic (valve) stenosis: Secondary | ICD-10-CM

## 2023-12-06 DIAGNOSIS — J9811 Atelectasis: Secondary | ICD-10-CM | POA: Diagnosis not present

## 2023-12-06 DIAGNOSIS — I9581 Postprocedural hypotension: Secondary | ICD-10-CM | POA: Diagnosis not present

## 2023-12-06 DIAGNOSIS — E872 Acidosis, unspecified: Secondary | ICD-10-CM | POA: Diagnosis not present

## 2023-12-06 DIAGNOSIS — Z4682 Encounter for fitting and adjustment of non-vascular catheter: Secondary | ICD-10-CM | POA: Diagnosis not present

## 2023-12-06 DIAGNOSIS — R918 Other nonspecific abnormal finding of lung field: Secondary | ICD-10-CM | POA: Diagnosis not present

## 2023-12-06 DIAGNOSIS — I5022 Chronic systolic (congestive) heart failure: Secondary | ICD-10-CM | POA: Diagnosis not present

## 2023-12-06 DIAGNOSIS — Z860100 Personal history of colon polyps, unspecified: Secondary | ICD-10-CM | POA: Diagnosis not present

## 2023-12-06 DIAGNOSIS — Z8719 Personal history of other diseases of the digestive system: Secondary | ICD-10-CM | POA: Diagnosis not present

## 2023-12-06 DIAGNOSIS — E039 Hypothyroidism, unspecified: Secondary | ICD-10-CM

## 2023-12-06 DIAGNOSIS — R7303 Prediabetes: Secondary | ICD-10-CM | POA: Diagnosis not present

## 2023-12-06 DIAGNOSIS — J95821 Acute postprocedural respiratory failure: Secondary | ICD-10-CM

## 2023-12-06 DIAGNOSIS — D62 Acute posthemorrhagic anemia: Secondary | ICD-10-CM | POA: Diagnosis not present

## 2023-12-06 DIAGNOSIS — Z79899 Other long term (current) drug therapy: Secondary | ICD-10-CM

## 2023-12-06 DIAGNOSIS — I509 Heart failure, unspecified: Secondary | ICD-10-CM | POA: Diagnosis not present

## 2023-12-06 DIAGNOSIS — K5903 Drug induced constipation: Secondary | ICD-10-CM | POA: Diagnosis not present

## 2023-12-06 DIAGNOSIS — I110 Hypertensive heart disease with heart failure: Secondary | ICD-10-CM | POA: Diagnosis not present

## 2023-12-06 DIAGNOSIS — K5900 Constipation, unspecified: Secondary | ICD-10-CM | POA: Diagnosis not present

## 2023-12-06 DIAGNOSIS — Z7982 Long term (current) use of aspirin: Secondary | ICD-10-CM

## 2023-12-06 DIAGNOSIS — Z833 Family history of diabetes mellitus: Secondary | ICD-10-CM

## 2023-12-06 DIAGNOSIS — I517 Cardiomegaly: Secondary | ICD-10-CM | POA: Diagnosis not present

## 2023-12-06 DIAGNOSIS — N179 Acute kidney failure, unspecified: Secondary | ICD-10-CM | POA: Diagnosis not present

## 2023-12-06 DIAGNOSIS — N990 Postprocedural (acute) (chronic) kidney failure: Secondary | ICD-10-CM | POA: Diagnosis not present

## 2023-12-06 DIAGNOSIS — E871 Hypo-osmolality and hyponatremia: Secondary | ICD-10-CM | POA: Diagnosis not present

## 2023-12-06 DIAGNOSIS — D6959 Other secondary thrombocytopenia: Secondary | ICD-10-CM | POA: Diagnosis not present

## 2023-12-06 DIAGNOSIS — E785 Hyperlipidemia, unspecified: Secondary | ICD-10-CM

## 2023-12-06 DIAGNOSIS — R0989 Other specified symptoms and signs involving the circulatory and respiratory systems: Secondary | ICD-10-CM | POA: Diagnosis not present

## 2023-12-06 DIAGNOSIS — R0689 Other abnormalities of breathing: Secondary | ICD-10-CM | POA: Diagnosis not present

## 2023-12-06 DIAGNOSIS — J90 Pleural effusion, not elsewhere classified: Secondary | ICD-10-CM | POA: Diagnosis not present

## 2023-12-06 DIAGNOSIS — Z48812 Encounter for surgical aftercare following surgery on the circulatory system: Secondary | ICD-10-CM | POA: Diagnosis not present

## 2023-12-06 DIAGNOSIS — I5032 Chronic diastolic (congestive) heart failure: Secondary | ICD-10-CM

## 2023-12-06 DIAGNOSIS — I4891 Unspecified atrial fibrillation: Secondary | ICD-10-CM | POA: Diagnosis not present

## 2023-12-06 DIAGNOSIS — Z952 Presence of prosthetic heart valve: Principal | ICD-10-CM

## 2023-12-06 DIAGNOSIS — I25118 Atherosclerotic heart disease of native coronary artery with other forms of angina pectoris: Secondary | ICD-10-CM

## 2023-12-06 DIAGNOSIS — J939 Pneumothorax, unspecified: Secondary | ICD-10-CM | POA: Diagnosis not present

## 2023-12-06 DIAGNOSIS — Z9889 Other specified postprocedural states: Secondary | ICD-10-CM | POA: Diagnosis not present

## 2023-12-06 HISTORY — PX: TEE WITHOUT CARDIOVERSION: SHX5443

## 2023-12-06 HISTORY — PX: CORONARY ARTERY BYPASS GRAFT: SHX141

## 2023-12-06 HISTORY — PX: AORTIC VALVE REPLACEMENT: SHX41

## 2023-12-06 LAB — PROTIME-INR
INR: 1.5 — ABNORMAL HIGH (ref 0.8–1.2)
Prothrombin Time: 17.9 s — ABNORMAL HIGH (ref 11.4–15.2)

## 2023-12-06 LAB — POCT I-STAT, CHEM 8
BUN: 16 mg/dL (ref 8–23)
BUN: 16 mg/dL (ref 8–23)
BUN: 17 mg/dL (ref 8–23)
BUN: 15 mg/dL (ref 8–23)
BUN: 15 mg/dL (ref 8–23)
Calcium, Ion: 1.25 mmol/L (ref 1.15–1.40)
Calcium, Ion: 1.25 mmol/L (ref 1.15–1.40)
Calcium, Ion: 1.15 mmol/L (ref 1.15–1.40)
Calcium, Ion: 1.15 mmol/L (ref 1.15–1.40)
Calcium, Ion: 1.13 mmol/L — ABNORMAL LOW (ref 1.15–1.40)
Chloride: 103 mmol/L (ref 98–111)
Chloride: 102 mmol/L (ref 98–111)
Chloride: 101 mmol/L (ref 98–111)
Chloride: 102 mmol/L (ref 98–111)
Chloride: 103 mmol/L (ref 98–111)
Creatinine, Ser: 0.7 mg/dL (ref 0.61–1.24)
Creatinine, Ser: 0.9 mg/dL (ref 0.61–1.24)
Creatinine, Ser: 0.8 mg/dL (ref 0.61–1.24)
Creatinine, Ser: 0.7 mg/dL (ref 0.61–1.24)
Creatinine, Ser: 0.7 mg/dL (ref 0.61–1.24)
Glucose, Bld: 136 mg/dL — ABNORMAL HIGH (ref 70–99)
Glucose, Bld: 129 mg/dL — ABNORMAL HIGH (ref 70–99)
Glucose, Bld: 136 mg/dL — ABNORMAL HIGH (ref 70–99)
Glucose, Bld: 188 mg/dL — ABNORMAL HIGH (ref 70–99)
Glucose, Bld: 158 mg/dL — ABNORMAL HIGH (ref 70–99)
HCT: 36 % — ABNORMAL LOW (ref 39.0–52.0)
HCT: 32 % — ABNORMAL LOW (ref 39.0–52.0)
HCT: 26 % — ABNORMAL LOW (ref 39.0–52.0)
HCT: 27 % — ABNORMAL LOW (ref 39.0–52.0)
HCT: 25 % — ABNORMAL LOW (ref 39.0–52.0)
Hemoglobin: 12.2 g/dL — ABNORMAL LOW (ref 13.0–17.0)
Hemoglobin: 10.9 g/dL — ABNORMAL LOW (ref 13.0–17.0)
Hemoglobin: 8.8 g/dL — ABNORMAL LOW (ref 13.0–17.0)
Hemoglobin: 9.2 g/dL — ABNORMAL LOW (ref 13.0–17.0)
Hemoglobin: 8.5 g/dL — ABNORMAL LOW (ref 13.0–17.0)
Potassium: 4 mmol/L (ref 3.5–5.1)
Potassium: 4.1 mmol/L (ref 3.5–5.1)
Potassium: 4.8 mmol/L (ref 3.5–5.1)
Potassium: 4.6 mmol/L (ref 3.5–5.1)
Potassium: 4 mmol/L (ref 3.5–5.1)
Sodium: 138 mmol/L (ref 135–145)
Sodium: 138 mmol/L (ref 135–145)
Sodium: 137 mmol/L (ref 135–145)
Sodium: 136 mmol/L (ref 135–145)
Sodium: 138 mmol/L (ref 135–145)
TCO2: 26 mmol/L (ref 22–32)
TCO2: 24 mmol/L (ref 22–32)
TCO2: 25 mmol/L (ref 22–32)
TCO2: 24 mmol/L (ref 22–32)
TCO2: 23 mmol/L (ref 22–32)

## 2023-12-06 LAB — CBC
HCT: 27.3 % — ABNORMAL LOW (ref 39.0–52.0)
HCT: 22.6 % — ABNORMAL LOW (ref 39.0–52.0)
Hemoglobin: 10 g/dL — ABNORMAL LOW (ref 13.0–17.0)
Hemoglobin: 8.4 g/dL — ABNORMAL LOW (ref 13.0–17.0)
MCH: 32.7 pg (ref 26.0–34.0)
MCH: 33.7 pg (ref 26.0–34.0)
MCHC: 36.6 g/dL — ABNORMAL HIGH (ref 30.0–36.0)
MCHC: 37.2 g/dL — ABNORMAL HIGH (ref 30.0–36.0)
MCV: 89.2 fL (ref 80.0–100.0)
MCV: 90.8 fL (ref 80.0–100.0)
Platelets: 101 10*3/uL — ABNORMAL LOW (ref 150–400)
Platelets: 92 10*3/uL — ABNORMAL LOW (ref 150–400)
RBC: 3.06 MIL/uL — ABNORMAL LOW (ref 4.22–5.81)
RBC: 2.49 MIL/uL — ABNORMAL LOW (ref 4.22–5.81)
RDW: 12.9 % (ref 11.5–15.5)
RDW: 12.8 % (ref 11.5–15.5)
WBC: 10 10*3/uL (ref 4.0–10.5)
WBC: 7.4 10*3/uL (ref 4.0–10.5)
nRBC: 0 % (ref 0.0–0.2)
nRBC: 0 % (ref 0.0–0.2)

## 2023-12-06 LAB — BASIC METABOLIC PANEL
Anion gap: 9 (ref 5–15)
BUN: 16 mg/dL (ref 8–23)
CO2: 22 mmol/L (ref 22–32)
Calcium: 7.8 mg/dL — ABNORMAL LOW (ref 8.9–10.3)
Chloride: 108 mmol/L (ref 98–111)
Creatinine, Ser: 0.85 mg/dL (ref 0.61–1.24)
GFR, Estimated: >60 mL/min (ref >60)
Glucose, Bld: 147 mg/dL — ABNORMAL HIGH (ref 70–99)
Potassium: 4.2 mmol/L (ref 3.5–5.1)
Sodium: 139 mmol/L (ref 135–145)

## 2023-12-06 LAB — POCT I-STAT 7, (LYTES, BLD GAS, ICA,H+H)
Acid-Base Excess: 0 mmol/L (ref 0.0–2.0)
Acid-Base Excess: 0 mmol/L (ref 0.0–2.0)
Acid-base deficit: 2 mmol/L (ref 0.0–2.0)
Acid-base deficit: 1 mmol/L (ref 0.0–2.0)
Acid-base deficit: 3 mmol/L — ABNORMAL HIGH (ref 0.0–2.0)
Acid-base deficit: 2 mmol/L (ref 0.0–2.0)
Acid-base deficit: 4 mmol/L — ABNORMAL HIGH (ref 0.0–2.0)
Acid-base deficit: 2 mmol/L (ref 0.0–2.0)
Bicarbonate: 24.7 mmol/L (ref 20.0–28.0)
Bicarbonate: 23 mmol/L (ref 20.0–28.0)
Bicarbonate: 24.6 mmol/L (ref 20.0–28.0)
Bicarbonate: 24.8 mmol/L (ref 20.0–28.0)
Bicarbonate: 23.2 mmol/L (ref 20.0–28.0)
Bicarbonate: 23.2 mmol/L (ref 20.0–28.0)
Bicarbonate: 20.6 mmol/L (ref 20.0–28.0)
Bicarbonate: 22.3 mmol/L (ref 20.0–28.0)
Calcium, Ion: 1.27 mmol/L (ref 1.15–1.40)
Calcium, Ion: 1.1 mmol/L — ABNORMAL LOW (ref 1.15–1.40)
Calcium, Ion: 1.16 mmol/L (ref 1.15–1.40)
Calcium, Ion: 1.17 mmol/L (ref 1.15–1.40)
Calcium, Ion: 1.13 mmol/L — ABNORMAL LOW (ref 1.15–1.40)
Calcium, Ion: 1.16 mmol/L (ref 1.15–1.40)
Calcium, Ion: 1.14 mmol/L — ABNORMAL LOW (ref 1.15–1.40)
Calcium, Ion: 1.12 mmol/L — ABNORMAL LOW (ref 1.15–1.40)
HCT: 37 % — ABNORMAL LOW (ref 39.0–52.0)
HCT: 26 % — ABNORMAL LOW (ref 39.0–52.0)
HCT: 27 % — ABNORMAL LOW (ref 39.0–52.0)
HCT: 27 % — ABNORMAL LOW (ref 39.0–52.0)
HCT: 28 % — ABNORMAL LOW (ref 39.0–52.0)
HCT: 28 % — ABNORMAL LOW (ref 39.0–52.0)
HCT: 22 % — ABNORMAL LOW (ref 39.0–52.0)
HCT: 20 % — ABNORMAL LOW (ref 39.0–52.0)
Hemoglobin: 12.6 g/dL — ABNORMAL LOW (ref 13.0–17.0)
Hemoglobin: 8.8 g/dL — ABNORMAL LOW (ref 13.0–17.0)
Hemoglobin: 9.2 g/dL — ABNORMAL LOW (ref 13.0–17.0)
Hemoglobin: 9.2 g/dL — ABNORMAL LOW (ref 13.0–17.0)
Hemoglobin: 9.5 g/dL — ABNORMAL LOW (ref 13.0–17.0)
Hemoglobin: 9.5 g/dL — ABNORMAL LOW (ref 13.0–17.0)
Hemoglobin: 7.5 g/dL — ABNORMAL LOW (ref 13.0–17.0)
Hemoglobin: 6.8 g/dL — CL (ref 13.0–17.0)
O2 Saturation: 100 %
O2 Saturation: 100 %
O2 Saturation: 100 %
O2 Saturation: 100 %
O2 Saturation: 97 %
O2 Saturation: 92 %
O2 Saturation: 96 %
O2 Saturation: 96 %
Patient temperature: 36
Patient temperature: 36.3
Patient temperature: 36.7
Potassium: 4 mmol/L (ref 3.5–5.1)
Potassium: 4.1 mmol/L (ref 3.5–5.1)
Potassium: 4.6 mmol/L (ref 3.5–5.1)
Potassium: 4.3 mmol/L (ref 3.5–5.1)
Potassium: 4 mmol/L (ref 3.5–5.1)
Potassium: 4.1 mmol/L (ref 3.5–5.1)
Potassium: 4.2 mmol/L (ref 3.5–5.1)
Potassium: 4.3 mmol/L (ref 3.5–5.1)
Sodium: 139 mmol/L (ref 135–145)
Sodium: 137 mmol/L (ref 135–145)
Sodium: 137 mmol/L (ref 135–145)
Sodium: 137 mmol/L (ref 135–145)
Sodium: 139 mmol/L (ref 135–145)
Sodium: 140 mmol/L (ref 135–145)
Sodium: 139 mmol/L (ref 135–145)
Sodium: 139 mmol/L (ref 135–145)
TCO2: 26 mmol/L (ref 22–32)
TCO2: 24 mmol/L (ref 22–32)
TCO2: 26 mmol/L (ref 22–32)
TCO2: 26 mmol/L (ref 22–32)
TCO2: 25 mmol/L (ref 22–32)
TCO2: 24 mmol/L (ref 22–32)
TCO2: 22 mmol/L (ref 22–32)
TCO2: 23 mmol/L (ref 22–32)
pCO2 arterial: 41.3 mm[Hg] (ref 32–48)
pCO2 arterial: 41.7 mm[Hg] (ref 32–48)
pCO2 arterial: 41.4 mm[Hg] (ref 32–48)
pCO2 arterial: 45.9 mm[Hg] (ref 32–48)
pCO2 arterial: 44 mm[Hg] (ref 32–48)
pCO2 arterial: 39.3 mm[Hg] (ref 32–48)
pCO2 arterial: 35.7 mm[Hg] (ref 32–48)
pCO2 arterial: 36.6 mm[Hg] (ref 32–48)
pH, Arterial: 7.385 (ref 7.35–7.45)
pH, Arterial: 7.349 — ABNORMAL LOW (ref 7.35–7.45)
pH, Arterial: 7.382 (ref 7.35–7.45)
pH, Arterial: 7.34 — ABNORMAL LOW (ref 7.35–7.45)
pH, Arterial: 7.331 — ABNORMAL LOW (ref 7.35–7.45)
pH, Arterial: 7.375 (ref 7.35–7.45)
pH, Arterial: 7.367 (ref 7.35–7.45)
pH, Arterial: 7.391 (ref 7.35–7.45)
pO2, Arterial: 326 mm[Hg] — ABNORMAL HIGH (ref 83–108)
pO2, Arterial: 325 mm[Hg] — ABNORMAL HIGH (ref 83–108)
pO2, Arterial: 241 mm[Hg] — ABNORMAL HIGH (ref 83–108)
pO2, Arterial: 249 mm[Hg] — ABNORMAL HIGH (ref 83–108)
pO2, Arterial: 96 mm[Hg] (ref 83–108)
pO2, Arterial: 61 mm[Hg] — ABNORMAL LOW (ref 83–108)
pO2, Arterial: 79 mm[Hg] — ABNORMAL LOW (ref 83–108)
pO2, Arterial: 78 mm[Hg] — ABNORMAL LOW (ref 83–108)

## 2023-12-06 LAB — GLUCOSE, CAPILLARY
Glucose-Capillary: 139 mg/dL — ABNORMAL HIGH (ref 70–99)
Glucose-Capillary: 109 mg/dL — ABNORMAL HIGH (ref 70–99)
Glucose-Capillary: 153 mg/dL — ABNORMAL HIGH (ref 70–99)
Glucose-Capillary: 148 mg/dL — ABNORMAL HIGH (ref 70–99)
Glucose-Capillary: 136 mg/dL — ABNORMAL HIGH (ref 70–99)
Glucose-Capillary: 143 mg/dL — ABNORMAL HIGH (ref 70–99)
Glucose-Capillary: 140 mg/dL — ABNORMAL HIGH (ref 70–99)
Glucose-Capillary: 189 mg/dL — ABNORMAL HIGH (ref 70–99)
Glucose-Capillary: 153 mg/dL — ABNORMAL HIGH (ref 70–99)
Glucose-Capillary: 150 mg/dL — ABNORMAL HIGH (ref 70–99)

## 2023-12-06 LAB — POCT I-STAT EG7
Acid-Base Excess: 0 mmol/L (ref 0.0–2.0)
Bicarbonate: 25.7 mmol/L (ref 20.0–28.0)
Calcium, Ion: 1.11 mmol/L — ABNORMAL LOW (ref 1.15–1.40)
HCT: 26 % — ABNORMAL LOW (ref 39.0–52.0)
Hemoglobin: 8.8 g/dL — ABNORMAL LOW (ref 13.0–17.0)
O2 Saturation: 74 %
Potassium: 4.2 mmol/L (ref 3.5–5.1)
Sodium: 138 mmol/L (ref 135–145)
TCO2: 27 mmol/L (ref 22–32)
pCO2, Ven: 48.5 mm[Hg] (ref 44–60)
pH, Ven: 7.333 (ref 7.25–7.43)
pO2, Ven: 42 mm[Hg] (ref 32–45)

## 2023-12-06 LAB — HEMOGLOBIN AND HEMATOCRIT, BLOOD
HCT: 25.6 % — ABNORMAL LOW (ref 39.0–52.0)
HCT: 19.7 % — ABNORMAL LOW (ref 39.0–52.0)
Hemoglobin: 9.4 g/dL — ABNORMAL LOW (ref 13.0–17.0)
Hemoglobin: 7.3 g/dL — ABNORMAL LOW (ref 13.0–17.0)

## 2023-12-06 LAB — PREPARE RBC (CROSSMATCH)

## 2023-12-06 LAB — MAGNESIUM: Magnesium: 3.2 mg/dL — ABNORMAL HIGH (ref 1.7–2.4)

## 2023-12-06 LAB — APTT: aPTT: 34 s (ref 24–36)

## 2023-12-06 LAB — PLATELET COUNT: Platelets: 126 10*3/uL — ABNORMAL LOW (ref 150–400)

## 2023-12-06 LAB — ABO/RH: ABO/RH(D): O POS

## 2023-12-06 SURGERY — REPLACEMENT, AORTIC VALVE, OPEN
Anesthesia: General | Site: Chest

## 2023-12-06 MED ORDER — SODIUM CHLORIDE 0.9 % IV SOLN: INTRAVENOUS | Status: DC | PRN

## 2023-12-06 MED ORDER — CHLORHEXIDINE GLUCONATE 4 % EX SOLN: 30.0000 mL | CUTANEOUS | Status: DC

## 2023-12-06 MED ORDER — FENTANYL CITRATE (PF) 250 MCG/5ML IJ SOLN
INTRAMUSCULAR | Status: AC
  Filled 2023-12-06: qty 5

## 2023-12-06 MED ORDER — MIDAZOLAM HCL (PF) 5 MG/ML IJ SOLN
INTRAMUSCULAR | Status: DC | PRN
  Administered 2023-12-06 (×3): 2 mg via INTRAVENOUS
  Administered 2023-12-06: 1 mg via INTRAVENOUS
  Administered 2023-12-06: 3 mg via INTRAVENOUS

## 2023-12-06 MED ORDER — METOCLOPRAMIDE HCL 5 MG/ML IJ SOLN
10.0000 mg | Freq: Four times a day (QID) | INTRAMUSCULAR | Status: DC
  Administered 2023-12-06: 10 mg via INTRAVENOUS
  Filled 2023-12-06 (×2): qty 2

## 2023-12-06 MED ORDER — SODIUM CHLORIDE 0.9% FLUSH: 3.0000 mL | INTRAVENOUS | Status: DC | PRN

## 2023-12-06 MED ORDER — BISACODYL 5 MG PO TBEC
10.0000 mg | DELAYED_RELEASE_TABLET | Freq: Every day | ORAL | Status: DC
  Administered 2023-12-07 – 2023-12-12 (×6): 10 mg via ORAL
  Filled 2023-12-06 (×7): qty 2

## 2023-12-06 MED ORDER — SODIUM CHLORIDE (PF) 0.9 % IJ SOLN
INTRAMUSCULAR | Status: AC
  Filled 2023-12-06: qty 10

## 2023-12-06 MED ORDER — LIDOCAINE 2% (20 MG/ML) 5 ML SYRINGE
INTRAMUSCULAR | Status: AC
  Filled 2023-12-06: qty 5

## 2023-12-06 MED ORDER — PLASMA-LYTE A IV SOLN: INTRAVENOUS | Status: DC | PRN

## 2023-12-06 MED ORDER — CEFAZOLIN SODIUM-DEXTROSE 2-4 GM/100ML-% IV SOLN
2.0000 g | Freq: Three times a day (TID) | INTRAVENOUS | Status: AC
  Administered 2023-12-06 – 2023-12-08 (×6): 2 g via INTRAVENOUS
  Filled 2023-12-06 (×6): qty 100

## 2023-12-06 MED ORDER — MAGNESIUM SULFATE 4 GM/100ML IV SOLN
4.0000 g | Freq: Once | INTRAVENOUS | Status: AC
  Administered 2023-12-06: 4 g via INTRAVENOUS
  Filled 2023-12-06: qty 100

## 2023-12-06 MED ORDER — PAPAVERINE HCL 30 MG/ML IJ SOLN
INTRAMUSCULAR | Status: DC | PRN
  Administered 2023-12-06: 60 mg via INTRAVENOUS

## 2023-12-06 MED ORDER — ~~LOC~~ CARDIAC SURGERY, PATIENT & FAMILY EDUCATION
Freq: Once | Status: DC
  Filled 2023-12-06: qty 1

## 2023-12-06 MED ORDER — INSULIN REGULAR(HUMAN) IN NACL 100-0.9 UT/100ML-% IV SOLN: INTRAVENOUS | Status: DC

## 2023-12-06 MED ORDER — ROCURONIUM BROMIDE 10 MG/ML (PF) SYRINGE
PREFILLED_SYRINGE | INTRAVENOUS | Status: AC
  Filled 2023-12-06: qty 10

## 2023-12-06 MED ORDER — LACTATED RINGERS IV SOLN: INTRAVENOUS | Status: DC | PRN

## 2023-12-06 MED ORDER — METOPROLOL TARTRATE 12.5 MG HALF TABLET
12.5000 mg | ORAL_TABLET | Freq: Once | ORAL | Status: AC
  Administered 2023-12-06: 12.5 mg via ORAL
  Filled 2023-12-06: qty 1

## 2023-12-06 MED ORDER — ASPIRIN 81 MG PO CHEW: 324.0000 mg | CHEWABLE_TABLET | Freq: Every day | ORAL | Status: DC

## 2023-12-06 MED ORDER — AMIODARONE HCL 200 MG PO TABS
400.0000 mg | ORAL_TABLET | Freq: Two times a day (BID) | ORAL | Status: DC
  Administered 2023-12-06 – 2023-12-10 (×8): 400 mg via ORAL
  Filled 2023-12-06 (×8): qty 2

## 2023-12-06 MED ORDER — PROTAMINE SULFATE 10 MG/ML IV SOLN
INTRAVENOUS | Status: AC
  Filled 2023-12-06: qty 5

## 2023-12-06 MED ORDER — SODIUM CHLORIDE 0.9% IV SOLUTION: Freq: Once | INTRAVENOUS | Status: DC

## 2023-12-06 MED ORDER — PROPOFOL 10 MG/ML IV BOLUS
INTRAVENOUS | Status: AC
  Filled 2023-12-06: qty 20

## 2023-12-06 MED ORDER — OXYCODONE HCL 5 MG PO TABS
5.0000 mg | ORAL_TABLET | ORAL | Status: DC | PRN
Start: 2023-12-06 — End: 2023-12-12
  Administered 2023-12-06: 10 mg via ORAL
  Administered 2023-12-06: 5 mg via ORAL
  Administered 2023-12-07 – 2023-12-08 (×5): 10 mg via ORAL
  Filled 2023-12-06 (×4): qty 2
  Filled 2023-12-06: qty 1
  Filled 2023-12-06 (×2): qty 2

## 2023-12-06 MED ORDER — ALBUMIN HUMAN 5 % IV SOLN: INTRAVENOUS | Status: DC | PRN

## 2023-12-06 MED ORDER — DEXAMETHASONE SODIUM PHOSPHATE 10 MG/ML IJ SOLN
INTRAMUSCULAR | Status: DC | PRN
  Administered 2023-12-06: 10 mg via INTRAVENOUS

## 2023-12-06 MED ORDER — METOCLOPRAMIDE HCL 5 MG/ML IJ SOLN
10.0000 mg | Freq: Four times a day (QID) | INTRAMUSCULAR | Status: AC
  Administered 2023-12-06 – 2023-12-07 (×5): 10 mg via INTRAVENOUS
  Filled 2023-12-06 (×5): qty 2

## 2023-12-06 MED ORDER — SODIUM CHLORIDE 0.45 % IV SOLN: INTRAVENOUS | Status: AC | PRN

## 2023-12-06 MED ORDER — HEPARIN SODIUM (PORCINE) 1000 UNIT/ML IJ SOLN
INTRAMUSCULAR | Status: AC
  Filled 2023-12-06: qty 1

## 2023-12-06 MED ORDER — METOPROLOL TARTRATE 12.5 MG HALF TABLET
12.5000 mg | ORAL_TABLET | Freq: Two times a day (BID) | ORAL | Status: DC
  Administered 2023-12-07 – 2023-12-08 (×4): 12.5 mg via ORAL
  Filled 2023-12-06 (×4): qty 1

## 2023-12-06 MED ORDER — PROTAMINE SULFATE 10 MG/ML IV SOLN
INTRAVENOUS | Status: DC | PRN
  Administered 2023-12-06: 280 mg via INTRAVENOUS
  Administered 2023-12-06: 20 mg via INTRAVENOUS

## 2023-12-06 MED ORDER — ONDANSETRON HCL 4 MG/2ML IJ SOLN
4.0000 mg | Freq: Four times a day (QID) | INTRAMUSCULAR | Status: DC | PRN
  Administered 2023-12-08: 4 mg via INTRAVENOUS
  Filled 2023-12-06: qty 2

## 2023-12-06 MED ORDER — MIDAZOLAM HCL 2 MG/2ML IJ SOLN: 2.0000 mg | INTRAMUSCULAR | Status: DC | PRN

## 2023-12-06 MED ORDER — DEXMEDETOMIDINE HCL IN NACL 400 MCG/100ML IV SOLN
0.0000 ug/kg/h | INTRAVENOUS | Status: DC
  Administered 2023-12-06: 0.7 ug/kg/h via INTRAVENOUS

## 2023-12-06 MED ORDER — LEVOTHYROXINE SODIUM 25 MCG PO TABS
25.0000 ug | ORAL_TABLET | Freq: Every day | ORAL | Status: DC
  Administered 2023-12-07 – 2023-12-09 (×3): 25 ug via ORAL
  Filled 2023-12-06 (×3): qty 1

## 2023-12-06 MED ORDER — PHENYLEPHRINE HCL-NACL 20-0.9 MG/250ML-% IV SOLN
0.0000 ug/min | INTRAVENOUS | Status: DC
  Administered 2023-12-06: 50 ug/min via INTRAVENOUS
  Administered 2023-12-06 – 2023-12-07 (×2): 100 ug/min via INTRAVENOUS
  Filled 2023-12-06: qty 250
  Filled 2023-12-06: qty 500
  Filled 2023-12-06: qty 250

## 2023-12-06 MED ORDER — DEXTROSE 50 % IV SOLN: 0.0000 mL | INTRAVENOUS | Status: DC | PRN

## 2023-12-06 MED ORDER — SODIUM CHLORIDE 0.9 % IV SOLN: 250.0000 mL | INTRAVENOUS | Status: AC

## 2023-12-06 MED ORDER — ROCURONIUM BROMIDE 10 MG/ML (PF) SYRINGE
PREFILLED_SYRINGE | INTRAVENOUS | Status: DC | PRN
  Administered 2023-12-06 (×2): 50 mg via INTRAVENOUS
  Administered 2023-12-06: 20 mg via INTRAVENOUS
  Administered 2023-12-06 (×2): 40 mg via INTRAVENOUS
  Administered 2023-12-06: 60 mg via INTRAVENOUS

## 2023-12-06 MED ORDER — ALBUMIN HUMAN 5 % IV SOLN
250.0000 mL | INTRAVENOUS | Status: AC | PRN
  Administered 2023-12-06 (×5): 12.5 g via INTRAVENOUS
  Filled 2023-12-06 (×3): qty 250

## 2023-12-06 MED ORDER — PANTOPRAZOLE SODIUM 40 MG PO TBEC: 40.0000 mg | DELAYED_RELEASE_TABLET | Freq: Every day | ORAL | Status: DC

## 2023-12-06 MED ORDER — ACETAMINOPHEN 160 MG/5ML PO SOLN
650.0000 mg | Freq: Once | ORAL | Status: AC
  Administered 2023-12-06: 650 mg
  Filled 2023-12-06: qty 20.3

## 2023-12-06 MED ORDER — ACETAMINOPHEN 160 MG/5ML PO SOLN: 1000.0000 mg | Freq: Four times a day (QID) | ORAL | Status: AC

## 2023-12-06 MED ORDER — MORPHINE SULFATE (PF) 2 MG/ML IV SOLN
1.0000 mg | INTRAVENOUS | Status: DC | PRN
  Administered 2023-12-06 – 2023-12-07 (×4): 2 mg via INTRAVENOUS
  Filled 2023-12-06 (×4): qty 1

## 2023-12-06 MED ORDER — CHLORHEXIDINE GLUCONATE CLOTH 2 % EX PADS
6.0000 | MEDICATED_PAD | Freq: Every day | CUTANEOUS | Status: AC
  Administered 2023-12-06 – 2023-12-10 (×5): 6 via TOPICAL

## 2023-12-06 MED ORDER — ASPIRIN 81 MG PO CHEW
324.0000 mg | CHEWABLE_TABLET | Freq: Once | ORAL | Status: AC
  Administered 2023-12-06: 324 mg via ORAL
  Filled 2023-12-06: qty 4

## 2023-12-06 MED ORDER — HEPARIN SODIUM (PORCINE) 1000 UNIT/ML IJ SOLN
INTRAMUSCULAR | Status: AC
  Filled 2023-12-06: qty 10

## 2023-12-06 MED ORDER — VANCOMYCIN HCL 1000 MG IV SOLR: INTRAVENOUS | Status: DC | PRN

## 2023-12-06 MED ORDER — FENTANYL CITRATE (PF) 250 MCG/5ML IJ SOLN
INTRAMUSCULAR | Status: DC | PRN
  Administered 2023-12-06: 100 ug via INTRAVENOUS
  Administered 2023-12-06: 200 ug via INTRAVENOUS
  Administered 2023-12-06 (×2): 100 ug via INTRAVENOUS
  Administered 2023-12-06: 50 ug via INTRAVENOUS
  Administered 2023-12-06: 100 ug via INTRAVENOUS

## 2023-12-06 MED ORDER — VANCOMYCIN HCL IN DEXTROSE 1-5 GM/200ML-% IV SOLN
1000.0000 mg | Freq: Once | INTRAVENOUS | Status: AC
  Administered 2023-12-06: 1000 mg via INTRAVENOUS
  Filled 2023-12-06: qty 200

## 2023-12-06 MED ORDER — PROPOFOL 10 MG/ML IV BOLUS
INTRAVENOUS | Status: DC | PRN
  Administered 2023-12-06: 20 mg via INTRAVENOUS
  Administered 2023-12-06: 110 mg via INTRAVENOUS

## 2023-12-06 MED ORDER — POTASSIUM CHLORIDE 10 MEQ/50ML IV SOLN: 10.0000 meq | INTRAVENOUS | Status: AC

## 2023-12-06 MED ORDER — BISACODYL 10 MG RE SUPP: 10.0000 mg | Freq: Every day | RECTAL | Status: DC

## 2023-12-06 MED ORDER — METOPROLOL TARTRATE 25 MG/10 ML ORAL SUSPENSION: 12.5000 mg | Freq: Two times a day (BID) | ORAL | Status: DC

## 2023-12-06 MED ORDER — MUPIROCIN 2 % EX OINT
1.0000 | TOPICAL_OINTMENT | Freq: Two times a day (BID) | CUTANEOUS | Status: AC
  Administered 2023-12-06 – 2023-12-10 (×9): 1 via NASAL
  Filled 2023-12-06 (×2): qty 22

## 2023-12-06 MED ORDER — ONDANSETRON HCL 4 MG/2ML IJ SOLN
INTRAMUSCULAR | Status: DC | PRN
  Administered 2023-12-06: 4 mg via INTRAVENOUS

## 2023-12-06 MED ORDER — TRAMADOL HCL 50 MG PO TABS
50.0000 mg | ORAL_TABLET | ORAL | Status: DC | PRN
  Administered 2023-12-07 (×3): 100 mg via ORAL
  Filled 2023-12-06 (×3): qty 2

## 2023-12-06 MED ORDER — CHLORHEXIDINE GLUCONATE 0.12 % MT SOLN
15.0000 mL | OROMUCOSAL | Status: AC
Start: 2023-12-06 — End: 2023-12-06
  Administered 2023-12-06: 15 mL via OROMUCOSAL
  Filled 2023-12-06: qty 15

## 2023-12-06 MED ORDER — EZETIMIBE 10 MG PO TABS
10.0000 mg | ORAL_TABLET | Freq: Every evening | ORAL | Status: DC
  Administered 2023-12-07 – 2023-12-11 (×5): 10 mg via ORAL
  Filled 2023-12-06 (×5): qty 1

## 2023-12-06 MED ORDER — SODIUM CHLORIDE 0.9% FLUSH
3.0000 mL | Freq: Two times a day (BID) | INTRAVENOUS | Status: DC
  Administered 2023-12-07 – 2023-12-11 (×10): 3 mL via INTRAVENOUS

## 2023-12-06 MED ORDER — 0.9 % SODIUM CHLORIDE (POUR BTL) OPTIME
TOPICAL | Status: DC | PRN
  Administered 2023-12-06: 5000 mL

## 2023-12-06 MED ORDER — PROTAMINE SULFATE 10 MG/ML IV SOLN
INTRAVENOUS | Status: AC
  Filled 2023-12-06: qty 25

## 2023-12-06 MED ORDER — ROSUVASTATIN CALCIUM 20 MG PO TABS
40.0000 mg | ORAL_TABLET | Freq: Every evening | ORAL | Status: DC
  Administered 2023-12-06 – 2023-12-11 (×6): 40 mg via ORAL
  Filled 2023-12-06 (×6): qty 2

## 2023-12-06 MED ORDER — PAPAVERINE HCL 30 MG/ML IJ SOLN
INTRAMUSCULAR | Status: AC
  Filled 2023-12-06: qty 2

## 2023-12-06 MED ORDER — ACETAMINOPHEN 500 MG PO TABS
1000.0000 mg | ORAL_TABLET | Freq: Four times a day (QID) | ORAL | Status: AC
  Administered 2023-12-06 – 2023-12-11 (×17): 1000 mg via ORAL
  Filled 2023-12-06 (×17): qty 2

## 2023-12-06 MED ORDER — ASPIRIN 325 MG PO TBEC: 325.0000 mg | DELAYED_RELEASE_TABLET | Freq: Every day | ORAL | Status: DC

## 2023-12-06 MED ORDER — CHLORHEXIDINE GLUCONATE 0.12 % MT SOLN
15.0000 mL | Freq: Once | OROMUCOSAL | Status: AC
  Administered 2023-12-06: 15 mL via OROMUCOSAL
  Filled 2023-12-06: qty 15

## 2023-12-06 MED ORDER — SUCCINYLCHOLINE CHLORIDE 200 MG/10ML IV SOSY
PREFILLED_SYRINGE | INTRAVENOUS | Status: AC
  Filled 2023-12-06: qty 10

## 2023-12-06 MED ORDER — METOPROLOL TARTRATE 5 MG/5ML IV SOLN: 2.5000 mg | INTRAVENOUS | Status: DC | PRN

## 2023-12-06 MED ORDER — LACTATED RINGERS IV SOLN: INTRAVENOUS | Status: AC

## 2023-12-06 MED ORDER — DEXAMETHASONE SODIUM PHOSPHATE 10 MG/ML IJ SOLN
INTRAMUSCULAR | Status: AC
  Filled 2023-12-06: qty 1

## 2023-12-06 MED ORDER — PANTOPRAZOLE SODIUM 40 MG IV SOLR
40.0000 mg | Freq: Every day | INTRAVENOUS | Status: DC
  Administered 2023-12-06: 40 mg via INTRAVENOUS
  Filled 2023-12-06: qty 10

## 2023-12-06 MED ORDER — DOCUSATE SODIUM 100 MG PO CAPS
200.0000 mg | ORAL_CAPSULE | Freq: Every day | ORAL | Status: DC
  Administered 2023-12-07 – 2023-12-12 (×6): 200 mg via ORAL
  Filled 2023-12-06 (×6): qty 2

## 2023-12-06 MED ORDER — NICARDIPINE HCL IN NACL 20-0.86 MG/200ML-% IV SOLN
0.0000 mg/h | INTRAVENOUS | Status: DC
Start: 2023-12-06 — End: 2023-12-07

## 2023-12-06 MED ORDER — SODIUM CHLORIDE 0.9% FLUSH
3.0000 mL | Freq: Two times a day (BID) | INTRAVENOUS | Status: DC
  Administered 2023-12-06 – 2023-12-10 (×9): 10 mL via INTRAVENOUS
  Administered 2023-12-11 (×2): 3 mL via INTRAVENOUS

## 2023-12-06 MED ORDER — MIDAZOLAM HCL (PF) 10 MG/2ML IJ SOLN
INTRAMUSCULAR | Status: AC
  Filled 2023-12-06: qty 2

## 2023-12-06 MED ORDER — ONDANSETRON HCL 4 MG/2ML IJ SOLN
INTRAMUSCULAR | Status: AC
  Filled 2023-12-06: qty 2

## 2023-12-06 MED ORDER — HEPARIN SODIUM (PORCINE) 1000 UNIT/ML IJ SOLN
INTRAMUSCULAR | Status: DC | PRN
  Administered 2023-12-06: 36000 [IU] via INTRAVENOUS

## 2023-12-06 MED ORDER — SODIUM BICARBONATE 8.4 % IV SOLN
50.0000 meq | Freq: Once | INTRAVENOUS | Status: AC
  Administered 2023-12-06: 50 meq via INTRAVENOUS

## 2023-12-06 SURGICAL SUPPLY — 90 items
ADAPTER CARDIO PERF ANTE/RETRO (ADAPTER) ×3 IMPLANT
BAG DECANTER FOR FLEXI CONT (MISCELLANEOUS) ×3 IMPLANT
BLADE CLIPPER SURG (BLADE) ×3 IMPLANT
BLADE MICRO SHARP 3 15 DEG (BLADE) ×3 IMPLANT
BLADE STERNUM SYSTEM 6 (BLADE) ×3 IMPLANT
BNDG ELASTIC 4INX 5YD STR LF (GAUZE/BANDAGES/DRESSINGS) IMPLANT
BNDG ELASTIC 4X5.8 VLCR STR LF (GAUZE/BANDAGES/DRESSINGS) ×3 IMPLANT
BNDG ELASTIC 6INX 5YD STR LF (GAUZE/BANDAGES/DRESSINGS) IMPLANT
BNDG ELASTIC 6X5.8 VLCR STR LF (GAUZE/BANDAGES/DRESSINGS) ×3 IMPLANT
BNDG GAUZE DERMACEA FLUFF 4 (GAUZE/BANDAGES/DRESSINGS) ×3 IMPLANT
CANISTER SUCT 3000ML PPV (MISCELLANEOUS) ×3 IMPLANT
CANNULA MC2 2 STG 36/46 CONN (CANNULA) IMPLANT
CANNULA NON VENT 20FR 12 (CANNULA) ×3 IMPLANT
CATH HEART VENT LEFT (CATHETERS) ×3 IMPLANT
CATH RETROPLEGIA CORONARY 14FR (CATHETERS) ×3 IMPLANT
CATH ROBINSON RED A/P 18FR (CATHETERS) ×9 IMPLANT
CATH THOR STR 32F SOFT 20 RADI (CATHETERS) ×6 IMPLANT
CATH THORACIC 28FR RT ANG (CATHETERS) ×3 IMPLANT
CLAMP SUTURE YELLOW 5 PAIRS (MISCELLANEOUS) ×3 IMPLANT
CLIP TI LARGE 6 (CLIP) ×3 IMPLANT
CLIP TI MEDIUM 24 (CLIP) IMPLANT
CLIP TI WIDE RED SMALL 24 (CLIP) IMPLANT
CNTNR URN SCR LID CUP LEK RST (MISCELLANEOUS) ×6 IMPLANT
DERMABOND ADVANCED .7 DNX12 (GAUZE/BANDAGES/DRESSINGS) IMPLANT
DEVICE SUT CK QUICK LOAD MINI (Prosthesis & Implant Heart) IMPLANT
DRAPE CV SPLIT W-CLR ANES SCRN (DRAPES) ×3 IMPLANT
DRAPE INCISE IOBAN 66X45 STRL (DRAPES) ×3 IMPLANT
DRAPE PERI GROIN 82X75IN TIB (DRAPES) ×3 IMPLANT
DRSG AQUACEL AG ADV 3.5X10 (GAUZE/BANDAGES/DRESSINGS) ×3 IMPLANT
ELECT BLADE 4.0 EZ CLEAN MEGAD (MISCELLANEOUS) ×2 IMPLANT
ELECT CAUTERY BLADE 6.4 (BLADE) ×3 IMPLANT
ELECT REM PT RETURN 9FT ADLT (ELECTROSURGICAL) ×4 IMPLANT
ELECTRODE BLDE 4.0 EZ CLN MEGD (MISCELLANEOUS) ×3 IMPLANT
ELECTRODE REM PT RTRN 9FT ADLT (ELECTROSURGICAL) ×6 IMPLANT
FELT TEFLON 1X6 (MISCELLANEOUS) ×6 IMPLANT
GAUZE SPONGE 4X4 12PLY STRL (GAUZE/BANDAGES/DRESSINGS) ×6 IMPLANT
GAUZE SPONGE 4X4 12PLY STRL LF (GAUZE/BANDAGES/DRESSINGS) IMPLANT
GLOVE SS BIOGEL STRL SZ 7.5 (GLOVE) IMPLANT
GOWN STRL REUS W/ TWL LRG LVL3 (GOWN DISPOSABLE) ×18 IMPLANT
HEMOSTAT SURGICEL 2X14 (HEMOSTASIS) IMPLANT
INSERT FOGARTY 61MM (MISCELLANEOUS) IMPLANT
INSERT FOGARTY XLG (MISCELLANEOUS) ×3 IMPLANT
KIT BASIN OR (CUSTOM PROCEDURE TRAY) ×3 IMPLANT
KIT SUCTION CATH 14FR (SUCTIONS) ×3 IMPLANT
KIT SUT CK MINI COMBO 4X17 (Prosthesis & Implant Heart) IMPLANT
KIT TURNOVER KIT B (KITS) ×3 IMPLANT
KIT VASOVIEW HEMOPRO 2 VH 4000 (KITS) ×3 IMPLANT
LINE VENT (MISCELLANEOUS) IMPLANT
MARKER DISTAL GRAFT W/ HOLDER (MISCELLANEOUS) ×6 IMPLANT
NS IRRIG 1000ML POUR BTL (IV SOLUTION) ×15 IMPLANT
ORGANIZER SUTURE GABBAY-FRATER (MISCELLANEOUS) ×3 IMPLANT
PACK E OPEN HEART (SUTURE) ×3 IMPLANT
PACK OPEN HEART (CUSTOM PROCEDURE TRAY) ×3 IMPLANT
PAD ARMBOARD 7.5X6 YLW CONV (MISCELLANEOUS) ×6 IMPLANT
PAD ELECT DEFIB RADIOL ZOLL (MISCELLANEOUS) ×3 IMPLANT
PENCIL BUTTON HOLSTER BLD 10FT (ELECTRODE) ×3 IMPLANT
POSITIONER HEAD DONUT 9IN (MISCELLANEOUS) ×3 IMPLANT
PUNCH AORTIC ROTATE 4.0MM (MISCELLANEOUS) ×3 IMPLANT
PUNCH AORTIC ROTATE 4.5MM 8IN (MISCELLANEOUS) ×3 IMPLANT
SET MPS 3-ND DEL (MISCELLANEOUS) IMPLANT
SPONGE T-LAP 18X18 ~~LOC~~+RFID (SPONGE) IMPLANT
SUPPORT HEART JANKE-BARRON (MISCELLANEOUS) ×3 IMPLANT
SUT BONE WAX W31G (SUTURE) ×3 IMPLANT
SUT EB EXC GRN/WHT 2-0 V-5 (SUTURE) ×6 IMPLANT
SUT MNCRL AB 4-0 PS2 18 (SUTURE) ×6 IMPLANT
SUT PROLENE 4 0 SH DA (SUTURE) ×3 IMPLANT
SUT PROLENE 4-0 RB1 .5 CRCL 36 (SUTURE) ×3 IMPLANT
SUT PROLENE 5 0 C 1 36 (SUTURE) IMPLANT
SUT PROLENE 6 0 C 1 30 (SUTURE) IMPLANT
SUT PROLENE 7 0 BV1 MDA (SUTURE) ×3 IMPLANT
SUT PROLENE 8 0 BV175 6 (SUTURE) IMPLANT
SUT STEEL 6MS V (SUTURE) IMPLANT
SUT STEEL SZ 6 DBL 3X14 BALL (SUTURE) ×6 IMPLANT
SUT VIC AB 0 CTX36XBRD ANTBCTR (SUTURE) ×6 IMPLANT
SUT VIC AB 2-0 CT1 TAPERPNT 27 (SUTURE) ×6 IMPLANT
SUT VIC AB 3-0 X1 27 (SUTURE) IMPLANT
SYR 20ML LL LF (SYRINGE) IMPLANT
SYR BULB IRRIG 60ML STRL (SYRINGE) IMPLANT
SYSTEM SAHARA CHEST DRAIN ATS (WOUND CARE) ×3 IMPLANT
TAG SUTURE CLAMP YLW 5PR (MISCELLANEOUS) ×2 IMPLANT
TAPE CLOTH SURG 4X10 WHT LF (GAUZE/BANDAGES/DRESSINGS) IMPLANT
TAPE PAPER 2X10 WHT MICROPORE (GAUZE/BANDAGES/DRESSINGS) IMPLANT
TOWEL GREEN STERILE (TOWEL DISPOSABLE) ×3 IMPLANT
TOWEL GREEN STERILE FF (TOWEL DISPOSABLE) ×3 IMPLANT
TRAY FOLEY SLVR 16FR TEMP STAT (SET/KITS/TRAYS/PACK) ×3 IMPLANT
TUBING LAP HI FLOW INSUFFLATIO (TUBING) ×3 IMPLANT
UNDERPAD 30X36 HEAVY ABSORB (UNDERPADS AND DIAPERS) ×3 IMPLANT
VALVE AORTIC SZ23 INSP/RESIL (Prosthesis & Implant Heart) IMPLANT
VENT LEFT HEART 12002 (CATHETERS) ×2 IMPLANT
WATER STERILE IRR 1000ML POUR (IV SOLUTION) ×6 IMPLANT

## 2023-12-06 NOTE — Anesthesia Procedure Notes (Signed)
 Central Venous Catheter Insertion Performed by: Maryclare Cornet, MD, anesthesiologist Start/End2/04/2024 7:01 AM, 12/06/2023 7:10 AM Patient location: Pre-op. Preanesthetic checklist: patient identified, IV checked, site marked, risks and benefits discussed, surgical consent, monitors and equipment checked, pre-op evaluation, timeout performed and anesthesia consent Lidocaine  1% used for infiltration and patient sedated Hand hygiene performed  and maximum sterile barriers used  Catheter size: 8.5 Fr Sheath introducer Procedure performed using ultrasound guided technique. Ultrasound Notes:anatomy identified, needle tip was noted to be adjacent to the nerve/plexus identified, no ultrasound evidence of intravascular and/or intraneural injection and image(s) printed for medical record Attempts: 1 Following insertion, line sutured and dressing applied. Post procedure assessment: blood return through all ports, free fluid flow and no air  Patient tolerated the procedure well with no immediate complications.

## 2023-12-06 NOTE — Discharge Summary (Signed)
 [address]       [place] [address]             [phone]    Physician Discharge Summary  Patient ID: [name] MRN: [id number] DOB/AGE: April 04, 1946 78 y.o.  Admit date: 12/06/2023 Discharge date: 12/12/2023  Admission Diagnoses:  Patient Active Problem List   Diagnosis Date Noted   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   Aortic stenosis 07/05/2023   CAD (coronary artery disease) 07/05/2023   Chest pressure 05/27/2023   Urinary hesitancy 03/22/2023   Hyperkalemia 03/22/2023   Hypothyroid 03/08/2021   History of colon polyps 03/08/2021   Alcohol intake above recommended sensible limits 05/13/2019   Hyperlipidemia 02/25/2019   Colon cancer screening 02/12/2017   Routine general medical examination at a health care facility 01/20/2016   Heart murmur, systolic 01/13/2014   Elevated PSA 01/13/2014   Encounter for Medicare annual wellness exam 09/24/2011   Prostate cancer screening 09/24/2011   Osteoarthritis, knee 02/14/2011   Tinnitus 12/09/2009   BACK PAIN, LUMBAR, WITH RADICULOPATHY 10/10/2009   Essential hypertension, benign 08/16/2009   Thrombocytopenia (HCC) 02/15/2009   Prediabetes 01/07/2008     Discharge Diagnoses:  Patient Active Problem List   Diagnosis Date Noted   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   Aortic stenosis 07/05/2023   CAD (coronary artery disease) 07/05/2023   Chest pressure 05/27/2023   Urinary hesitancy 03/22/2023   Hyperkalemia 03/22/2023   Hypothyroid 03/08/2021   History of colon polyps 03/08/2021   Alcohol intake above recommended sensible limits 05/13/2019   Hyperlipidemia 02/25/2019   Colon cancer screening 02/12/2017   Routine general medical examination at a health care facility 01/20/2016   Heart murmur, systolic 01/13/2014   Elevated PSA 01/13/2014   Encounter for Medicare annual wellness exam 09/24/2011   Prostate cancer screening 09/24/2011   Osteoarthritis, knee 02/14/2011    Tinnitus 12/09/2009   BACK PAIN, LUMBAR, WITH RADICULOPATHY 10/10/2009   Essential hypertension, benign 08/16/2009   Thrombocytopenia (HCC) 02/15/2009   Prediabetes 01/07/2008     Discharged Condition: good   Shlomo Wilbert SAUNDERS, MD [place], Laine LABOR, MD   History of Present Illness:   At time of CT surgical evaluation. 78 yo male with increasing DOE and occasional CP. Pt reports that last summer these symptoms had occurred with moderate activity. He had curtailed these activities to feel better but underwent a Coronary CTA last fall with concerns for CAD. Pt underwent cath with moderate LAD/Diag disease and echo with moderate AS with a AVA of 1.1cm2. He was attempted to be enrolled in the PROGRESS trial but with LVOT calcium  was not a candidate. Pt was treated medically however pt had ongoing and progressive symptoms to where a repeat echo this past December revealed his AS had progressed to severe with a mean gradient of and an AVA of 1.0cm2. Pt was felt to require AVR and PET stress test was performed to try to evaluate his CAD which was felt to be non ischemic.  It was Dr. Adriana recommendation to proceed with AVR/CABG and the patient was admitted electively for the procedure.  Hospital course: The patient was taken the operating room on 12/06/2023 at which time he underwent aortic valve replacement with a 23 mm Inspiris pericardial valve and CABG x 2 with the LIMA to the LAD and reversed saphenous vein graft from aorta to diagonal.  He tolerated the procedure well was taken to the surgical intensive care unit in  stable condition. He was extubated the evening of surgery. He was weaned off Neo Synephrine drip. He was transfused for blood loss anemia. Swan and a line were removed on post op day one. Chest tubes and foley on day two. He was transitioned off the Insulin  drip. His pre op HGA1C was 5.7. 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 He had AKI post op. Creatinine went as high as 2.15, likely  secondary to pump run and hypotension.  It has improved over time.  Most recent value on 12/12/2023 is 1.54.  He had expected post op blood loss anemia requiring transfusion 1U PRBCs. He developed atrial fibrillation with RVR but chemically converted to NSR with IV Amiodarone . This was later transitioned to PO Amiodarone . He was routinely diuresed.  We will continue for a short-term postop as he has some small pleural effusions.  Oxygen was weaned and he maintains good saturations on room air.  Incisions are healing well without evidence of infection.  He is tolerating diet and routine activities using standard protocols.  He has a T CTS home health plan with [facility] with arrangements in place.  Overall, at the time of discharge patient is felt to be quite stable  Consults: pulmonary/intensive care  Significant Diagnostic Studies:  DG CHEST PORT 1 VIEW Result Date: 12/11/2023 CLINICAL DATA:  Status post aortic valve repair EXAM: PORTABLE CHEST 1 VIEW COMPARISON:  12/09/2023 FINDINGS: Cardiac shadow is enlarged but stable. Aortic valve replacement is again seen. Left-sided chest tube has been removed in the interval. No significant pneumothorax is noted. Small effusions are noted bilaterally. IMPRESSION: No pneumothorax following chest tube removal. Electronically Signed   By: Oneil Devonshire M.D.   On: 12/11/2023 09:51   DG CHEST PORT 1 VIEW Result Date: 12/09/2023 CLINICAL DATA:  Status post aortic valve replacement. EXAM: PORTABLE CHEST 1 VIEW COMPARISON:  Radiograph yesterday FINDINGS: Median sternotomy. Persistent low lung volumes. Left-sided chest tube and mediastinal drains remain in place. Right internal jugular sheath remains in place. Stable heart size and mediastinal contours, prosthetic aortic valve. Bibasilar volume loss and opacity, favoring atelectasis and effusions, improvement at the right lung base from prior exam. There is a small left apical pneumothorax, visualized under the posterior  second rib. IMPRESSION: 1. Small left apical pneumothorax. Left-sided chest tube in place. 2. Bibasilar volume loss and opacity, favoring atelectasis and effusions, improvement on the right since yesterday. Electronically Signed   By: Andrea Gasman M.D.   On: 12/09/2023 10:06   DG CHEST PORT 1 VIEW Result Date: 12/08/2023 CLINICAL DATA:  Postop from aortic valve replacement. EXAM: PORTABLE CHEST 1 VIEW COMPARISON:  12/07/2023 FINDINGS: Swan-Ganz catheter has been removed. Left chest tube and mediastinal drains remain in place. No pneumothorax visualized. Bibasilar atelectasis is again seen, mildly increased on the right since previous study. Probable small bilateral pleural effusions. IMPRESSION: Bibasilar atelectasis, mildly increased on the right. Probable small bilateral pleural effusions. No pneumothorax visualized. Electronically Signed   By: Norleen DELENA Kil M.D.   On: 12/08/2023 09:30   DG Chest Port 1 View Result Date: 12/07/2023 CLINICAL DATA:  Postop from CABG and aortic valve replacement. EXAM: PORTABLE CHEST 1 VIEW COMPARISON:  12/06/2023 FINDINGS: Endotracheal tube and enteric tube have been removed since previous study. Swan-Ganz catheter and left chest tube and mediastinal drains remain in place. No pneumothorax visualized. Mild increase in atelectasis or infiltrate is seen in the left retrocardiac lung base. Mild right basilar atelectasis shows no significant change. IMPRESSION: Mild increase in left basilar atelectasis  or infiltrate. Stable mild right basilar atelectasis. Electronically Signed   By: Norleen DELENA Kil M.D.   On: 12/07/2023 09:30   ECHO INTRAOPERATIVE TEE Result Date: 12/07/2023  *INTRAOPERATIVE TRANSESOPHAGEAL REPORT *  Patient Name:   Isair Ammons Date of Exam: 12/06/2023 Medical Rec #:  [id number]        Height:       68.0 in Accession #:    [id number]       Weight:       223.0 lb Date of Birth:  04-26-1946       BSA:          2.14 m Patient Age:    77 years         BP:            120/68 mmHg Patient Gender: M                HR:           78 bpm. Exam Location:  Anesthesiology Transesophogeal exam was perform intraoperatively during surgical procedure. Patient was closely monitored under general anesthesia during the entirety of examination. Indications:     I35.0 Nonrheumatic aortic (valve) stenosis; I25.110                  Atherosclerotic heart disease of native coronary artery with                  unstable angina pectoris Sonographer:     Ellouise Mose RDCS Performing Phys: [id number] DEWARD KALLMAN Diagnosing Phys: Juliene Clinton MD Complications: No known complications during this procedure. POST-OP IMPRESSIONS _ Left Ventricle: The left ventricle is unchanged from pre-bypass. _ Aortic Valve: A bioprosthetic valve was placed, leaflets are freely mobile and thin. Manufactured by; inspiris Size; 23mm. Mean PG 8 mmHg across the new AV. _ Mitral Valve: The mitral valve appears unchanged from pre-bypass. PRE-OP FINDINGS  Left Ventricle: The left ventricle has normal systolic function, with an ejection fraction of 60-65%. The cavity size was normal. There is moderately increased left ventricular wall thickness. There is moderate concentric left ventricular hypertrophy. Right Ventricle: The right ventricle has normal systolic function. The cavity was normal. There is no increase in right ventricular wall thickness. Left Atrium: Left atrial size was normal in size. No left atrial/left atrial appendage thrombus was detected. Right Atrium: Right atrial size was normal in size. Interatrial Septum: Evidence of atrial level shunting detected by color flow Doppler. Agitated saline contrast was given intravenously to evaluate for intracardiac shunting. There is redundancy of the interatrial septum. Pericardium: Trivial pericardial effusion is present. The pericardial effusion is localized near the right ventricle. Mitral Valve: The mitral valve is normal in structure. Mitral valve regurgitation is mild by  color flow Doppler. There is No evidence of mitral stenosis. Tricuspid Valve: The tricuspid valve was normal in structure. Tricuspid valve regurgitation is mild by color flow Doppler. Aortic Valve: The aortic valve is tricuspid Aortic valve regurgitation is mild by color flow Doppler. The jet is centrally-directed. There is severe stenosis of the aortic valve. Pulmonic Valve: The pulmonic valve was normal in structure, with normal. Pulmonic valve regurgitation is trivial by color flow Doppler. Aorta: The aortic root, ascending aorta and aortic arch are normal in size and structure. +-------------+---------++ AORTIC VALVE           +-------------+---------++ AV Mean Grad:39.0 mmHg +-------------+---------++  Juliene Clinton MD Electronically signed by Juliene Clinton MD Signature Date/Time: 12/07/2023/7:36:11 AM    Final  DG Chest Port 1 View Result Date: 12/06/2023 CLINICAL DATA:  Status post aortic valve replacement EXAM: PORTABLE CHEST 1 VIEW COMPARISON:  12/03/2022 FINDINGS: Interval median sternotomy for aortic valve repair. Endotracheal tube terminates 2.3 cm above carina. Right IJ Swan-Ganz catheter tip in central right pulmonary artery. Nasogastric tube terminates at the body of the stomach. Mediastinal drain and left chest tubes. Cardiomegaly accentuated by AP portable technique. No pleural effusion or pneumothorax. Mild pulmonary venous congestion. Low lung volumes with left-greater-than-right lower lung atelectasis. IMPRESSION: Appropriate position of support apparatus, without pneumothorax or other acute complication. Cardiomegaly with pulmonary venous congestion and left-greater-than-right lower lung atelectasis. Electronically Signed   By: Rockey Kilts M.D.   On: 12/06/2023 15:06   DG Chest 2 View Result Date: 12/04/2023 CLINICAL DATA:  Preop for open heart surgery. EXAM: CHEST - 2 VIEW COMPARISON:  CT 07/09/2023 FINDINGS: Heart size upper normal. The cardiomediastinal contours are normal.  Aortic atherosclerosis. Pulmonary vasculature is normal. No consolidation, pleural effusion, or pneumothorax. No acute osseous abnormalities are seen. IMPRESSION: No active cardiopulmonary disease. Electronically Signed   By: Andrea Gasman M.D.   On: 12/04/2023 19:05   VAS US  DOPPLER PRE CABG Result Date: 12/04/2023 PREOPERATIVE VASCULAR EVALUATION Patient Name:  Daric Hesler  Date of Exam:   12/04/2023 Medical Rec #: [id number]         Accession #:    [id number] Date of Birth: [birth year]        Patient Gender: M Patient Age:   35 years Exam Location:  [facility] Procedure:      VAS US  DOPPLER PRE CABG Referring Phys: DEWARD KALLMAN --------------------------------------------------------------------------------  Indications:      Pre-AVR. Risk Factors:     Hypertension, coronary artery disease. Comparison Study: No prior studies. Performing Technologist: Gerome Ny RVT  Examination Guidelines: A complete evaluation includes B-mode imaging, spectral Doppler, color Doppler, and power Doppler as needed of all accessible portions of each vessel. Bilateral testing is considered an integral part of a complete examination. Limited examinations for reoccurring indications may be performed as noted.  Right Carotid Findings: +----------+--------+--------+--------+--------------------------+--------+           PSV cm/sEDV cm/sStenosisDescribe                  Comments +----------+--------+--------+--------+--------------------------+--------+ CCA Prox  76      18                                        tortuous +----------+--------+--------+--------+--------------------------+--------+ CCA Distal85      15                                                 +----------+--------+--------+--------+--------------------------+--------+ ICA Prox  66      22              irregular and heterogenous         +----------+--------+--------+--------+--------------------------+--------+ ICA Mid    56      19                                                 +----------+--------+--------+--------+--------------------------+--------+ ICA Distal69  22                                        tortuous +----------+--------+--------+--------+--------------------------+--------+ ECA       138     21                                                 +----------+--------+--------+--------+--------------------------+--------+ +----------+--------+-------+--------+------------+           PSV cm/sEDV cmsDescribeArm Pressure +----------+--------+-------+--------+------------+ Subclavian131                                 +----------+--------+-------+--------+------------+ +---------+--------+--+--------+--+---------+ VertebralPSV cm/s63EDV cm/s16Antegrade +---------+--------+--+--------+--+---------+ Left Carotid Findings: +----------+--------+--------+--------+-----------------------+--------+           PSV cm/sEDV cm/sStenosisDescribe               Comments +----------+--------+--------+--------+-----------------------+--------+ CCA Prox  110     27              smooth and heterogenous         +----------+--------+--------+--------+-----------------------+--------+ CCA Distal83      22              smooth and heterogenous         +----------+--------+--------+--------+-----------------------+--------+ ICA Prox  69      22              smooth and heterogenoustortuous +----------+--------+--------+--------+-----------------------+--------+ ICA Mid   69      28                                              +----------+--------+--------+--------+-----------------------+--------+ ICA Distal121     29                                     tortuous +----------+--------+--------+--------+-----------------------+--------+ ECA       105     26                                               +----------+--------+--------+--------+-----------------------+--------+ +----------+--------+--------+--------+------------+ SubclavianPSV cm/sEDV cm/sDescribeArm Pressure +----------+--------+--------+--------+------------+           121                                  +----------+--------+--------+--------+------------+ +---------+--------+--+--------+-+---------+ VertebralPSV cm/s33EDV cm/s9Antegrade +---------+--------+--+--------+-+---------+  ABI Findings: +------------------+---------+ Rt Pressure (mmHg)Waveform  +------------------+---------+ 153               triphasic +------------------+---------+ +------------------+---------+ Lt Pressure (mmHg)Waveform  +------------------+---------+ 150               triphasic +------------------+---------+  Right Doppler Findings: +--------+--------+---------+ Site    PressureDoppler   +--------+--------+---------+ [id number]     triphasic +--------+--------+---------+ Radial          triphasic +--------+--------+---------+ Ulnar           triphasic +--------+--------+---------+  Left  Doppler Findings: +--------+--------+---------+ Site    PressureDoppler   +--------+--------+---------+ Brachial150     triphasic +--------+--------+---------+ Radial          triphasic +--------+--------+---------+ Ulnar           triphasic +--------+--------+---------+   Summary: Right Carotid: Velocities in the right ICA are consistent with a 1-39% stenosis. Left Carotid: Velocities in the left ICA are consistent with a 1-39% stenosis. Vertebrals: Bilateral vertebral arteries demonstrate antegrade flow. Right Upper Extremity: Doppler waveforms remain within normal limits with right radial compression. Doppler waveforms decrease >50% with right ulnar compression. Left Upper Extremity: Doppler waveform obliterate with left radial compression. Doppler waveforms decrease >50% with left ulnar compression.   Electronically  signed by Gaile New MD on 12/04/2023 at 5:53:54 PM.    Final    NM PET CT CARDIAC PERFUSION MULTI W/ABSOLUTE BLOODFLOW Addendum Date: 11/26/2023   Findings are consistent with no ischemia and no infarction on perfusion images. The study is intermediate risk given presence of transient ischemic dilation, and no augmentation of EF with stress. Reduced MBFR may be secondary to severe aortic valve stenosis contributing to microvascular dysfunction.   Myocardial blood flow was computed to be 1.48ml/g/min at rest and 1.28ml/g/min at stress. Global myocardial blood flow reserve was 1.10. Myocardial blood flow reserve is indeterminate in this patient due to technical or patient-specific concerns that affect accuracy: severe aortic valve stenosis.   LV perfusion is normal. There is no evidence of ischemia. There is no evidence of infarction.   Rest left ventricular function is normal. Rest EF: 57%. Stress left ventricular function is normal. Stress EF: 53%. End diastolic cavity size is moderately enlarged. End systolic cavity size is mildly enlarged. Evidence of transient ischemic dilation (TID) noted.   Coronary calcium  was present on the attenuation correction CT images. Severe coronary calcifications were present. Coronary calcifications were present in the left anterior descending artery, left circumflex artery and right coronary artery distribution(s).   Electronically signed by: Soyla DELENA Merck, MD CLINICAL DATA:  This over-read does not include interpretation of cardiac or coronary anatomy or pathology. The Cardiac PET CT interpretation by the cardiologist is attached. COMPARISON:  CT angio chest 07/09/2023 FINDINGS: Vascular: No acute abnormality. Mediastinum/Nodes: No mass or adenopathy identified within the imaged portions of the mediastinum. Lungs/Pleura: No pleural effusion, airspace consolidation or pneumothorax. No suspicious pulmonary nodule or mass identified. Upper Abdomen: No acute abnormality within  the imaged portions of the upper abdomen. Hepatic steatosis. Musculoskeletal: No acute or suspicious osseous findings. IMPRESSION: 1. No significant noncardiac supplemental findings identified within the chest. 2. Hepatic steatosis. Electronically Signed   By: Waddell Calk M.D.   On: 11/26/2023 13:53   Result Date: 11/26/2023   Findings are consistent with no ischemia and no infarction on perfusion images. The study is intermediate risk given abnormal MBFR, presence of transient ischemic dilation, and no augmentation of EF with stress. Reduced MBFR may be secondary to severe aortic valve stenosis contributing to microvascular dysfunction, or possibly mulitvessel CAD.   Myocardial blood flow was computed to be 1.66ml/g/min at rest and 1.28ml/g/min at stress. Global myocardial blood flow reserve was 1.10. Myocardial blood flow reserve is indeterminate and therefore is not reported in this patient due to technical or patient-specific concerns that affect accuracy.   LV perfusion is normal. There is no evidence of ischemia. There is no evidence of infarction.   Rest left ventricular function is normal. Rest EF: 57%. Stress left ventricular function is normal. Stress EF:  53%. End diastolic cavity size is moderately enlarged. End systolic cavity size is mildly enlarged. Evidence of transient ischemic dilation (TID) noted.   Coronary calcium  was present on the attenuation correction CT images. Severe coronary calcifications were present. Coronary calcifications were present in the left anterior descending artery, left circumflex artery and right coronary artery distribution(s).   Electronically signed by: Soyla DELENA Merck, MD CLINICAL DATA:  This over-read does not include interpretation of cardiac or coronary anatomy or pathology. The Cardiac PET CT interpretation by the cardiologist is attached. COMPARISON:  CT angio chest 07/09/2023 FINDINGS: Vascular: No acute abnormality. Mediastinum/Nodes: No mass or adenopathy  identified within the imaged portions of the mediastinum. Lungs/Pleura: No pleural effusion, airspace consolidation or pneumothorax. No suspicious pulmonary nodule or mass identified. Upper Abdomen: No acute abnormality within the imaged portions of the upper abdomen. Hepatic steatosis. Musculoskeletal: No acute or suspicious osseous findings. IMPRESSION: 1. No significant noncardiac supplemental findings identified within the chest. 2. Hepatic steatosis. Electronically Signed   By: Waddell Calk M.D.   On: 11/26/2023 13:53    Results for orders placed or performed during the hospital encounter of 12/06/23 (from the past 48 hours)  Glucose, capillary     Status: Abnormal   Collection Time: 12/10/23 12:23 PM  Result Value Ref Range   Glucose-Capillary 151 (H) 70 - 99 mg/dL    Comment: Glucose reference range applies only to samples taken after fasting for at least 8 hours.  Glucose, capillary     Status: Abnormal   Collection Time: 12/10/23  4:13 PM  Result Value Ref Range   Glucose-Capillary 112 (H) 70 - 99 mg/dL    Comment: Glucose reference range applies only to samples taken after fasting for at least 8 hours.  Glucose, capillary     Status: Abnormal   Collection Time: 12/10/23  4:46 PM  Result Value Ref Range   Glucose-Capillary 115 (H) 70 - 99 mg/dL    Comment: Glucose reference range applies only to samples taken after fasting for at least 8 hours.  Glucose, capillary     Status: Abnormal   Collection Time: 12/10/23 10:08 PM  Result Value Ref Range   Glucose-Capillary 111 (H) 70 - 99 mg/dL    Comment: Glucose reference range applies only to samples taken after fasting for at least 8 hours.  CBC     Status: Abnormal   Collection Time: 12/11/23  2:11 AM  Result Value Ref Range   WBC 7.3 4.0 - 10.5 K/uL   RBC 2.85 (L) 4.22 - 5.81 MIL/uL   Hemoglobin 9.1 (L) 13.0 - 17.0 g/dL    Comment: POST TRANSFUSION SPECIMEN   HCT 25.4 (L) 39.0 - 52.0 %   MCV 89.1 80.0 - 100.0 fL   MCH 31.9  26.0 - 34.0 pg   MCHC 35.8 30.0 - 36.0 g/dL   RDW 86.0 88.4 - 84.4 %   Platelets 133 (L) 150 - 400 K/uL    Comment: SPECIMEN CHECKED FOR CLOTS REPEATED TO VERIFY PLATELET COUNT CONFIRMED BY SMEAR    nRBC 0.0 0.0 - 0.2 %    Comment: Performed at [facility], [address], [place], [place] [address]  Basic metabolic panel     Status: Abnormal   Collection Time: 12/11/23  2:11 AM  Result Value Ref Range   Sodium 131 (L) 135 - 145 mmol/L   Potassium 3.7 3.5 - 5.1 mmol/L   Chloride 94 (L) 98 - 111 mmol/L   CO2 24  22 - 32 mmol/L   Glucose, Bld 104 (H) 70 - 99 mg/dL    Comment: Glucose reference range applies only to samples taken after fasting for at least 8 hours.   BUN 42 (H) 8 - 23 mg/dL   Creatinine, Ser 8.32 (H) 0.61 - 1.24 mg/dL   Calcium  8.2 (L) 8.9 - 10.3 mg/dL   GFR, Estimated 42 (L) >60 mL/min    Comment: (NOTE) Calculated using the CKD-EPI Creatinine Equation (2021)    Anion gap 13 5 - 15    Comment: Performed at [facility], [address], [place], [place] [address]  Glucose, capillary     Status: Abnormal   Collection Time: 12/11/23  6:25 AM  Result Value Ref Range   Glucose-Capillary 110 (H) 70 - 99 mg/dL    Comment: Glucose reference range applies only to samples taken after fasting for at least 8 hours.  Glucose, capillary     Status: Abnormal   Collection Time: 12/11/23 11:20 AM  Result Value Ref Range   Glucose-Capillary 144 (H) 70 - 99 mg/dL    Comment: Glucose reference range applies only to samples taken after fasting for at least 8 hours.  Glucose, capillary     Status: None   Collection Time: 12/11/23  4:04 PM  Result Value Ref Range   Glucose-Capillary 99 70 - 99 mg/dL    Comment: Glucose reference range applies only to samples taken after fasting for at least 8 hours.  Glucose, capillary     Status: None   Collection Time: 12/11/23  9:13 PM  Result Value Ref Range   Glucose-Capillary 77 70 - 99 mg/dL    Comment: Glucose reference  range applies only to samples taken after fasting for at least 8 hours.  CBC     Status: Abnormal   Collection Time: 12/12/23  3:50 AM  Result Value Ref Range   WBC 7.1 4.0 - 10.5 K/uL   RBC 2.87 (L) 4.22 - 5.81 MIL/uL   Hemoglobin 9.1 (L) 13.0 - 17.0 g/dL   HCT 74.2 (L) 60.9 - 47.9 %   MCV 89.5 80.0 - 100.0 fL   MCH 31.7 26.0 - 34.0 pg   MCHC 35.4 30.0 - 36.0 g/dL   RDW 86.2 88.4 - 84.4 %   Platelets 173 150 - 400 K/uL   nRBC 0.0 0.0 - 0.2 %    Comment: Performed at [facility], [address], [place], [place] [address]  Basic metabolic panel     Status: Abnormal   Collection Time: 12/12/23  3:50 AM  Result Value Ref Range   Sodium 135 135 - 145 mmol/L   Potassium 3.3 (L) 3.5 - 5.1 mmol/L   Chloride 95 (L) 98 - 111 mmol/L   CO2 27 22 - 32 mmol/L   Glucose, Bld 87 70 - 99 mg/dL    Comment: Glucose reference range applies only to samples taken after fasting for at least 8 hours.   BUN 36 (H) 8 - 23 mg/dL   Creatinine, Ser 8.45 (H) 0.61 - 1.24 mg/dL   Calcium  8.6 (L) 8.9 - 10.3 mg/dL   GFR, Estimated 47 (L) >60 mL/min    Comment: (NOTE) Calculated using the CKD-EPI Creatinine Equation (2021)    Anion gap 13 5 - 15    Comment: Performed at [facility], [address], [place], [place] [address]  Glucose, capillary     Status: None   Collection Time: 12/12/23  6:19 AM  Result Value Ref Range   Glucose-Capillary 75 70 - 99 mg/dL    Comment: Glucose reference range applies only to samples taken after fasting for at least 8 hours.  Magnesium      Status: Abnormal   Collection Time: 12/12/23  7:58 AM  Result Value Ref Range   Magnesium  2.7 (H) 1.7 - 2.4 mg/dL    Comment: Performed at [facility], [address], [place], [place] [address]     Treatments: surgery:  CARDIOVASCULAR SURGERY OPERATIVE NOTE   12/06/2023 Reyes Colder [id number]   Surgeon:  Deward LELON Kallman, MD   First Assistant: Lemond Cera [facility]                               An experienced  assistant was required given the complexity of this surgery and the standard of surgical care. The assistant was needed for exposure, dissection, suctioning, retraction of delicate tissues and sutures, instrument exchange and for overall help during this procedure.       Preoperative Diagnosis:Aortic Stenosis                                          Coronary artery disease     Postoperative Diagnosis:  Same     Procedure: Aortic Valve Replacement with a 23mm Inspiris Pericardial Valve Coronary Artery Bypass x 2 with the LIMA to the LAD and RSVG from aorta to DIAG   Anesthesia:  General Endotracheal    Discharge Exam: Blood pressure 129/70, pulse 87, temperature 98.6 F (37 C), temperature source Oral, resp. rate 16, height 5' 8 (1.727 m), weight 99.8 kg, SpO2 96%.  General appearance: alert, cooperative, and no distress Heart: regular rate and rhythm Lungs: clear to auscultation bilaterally Abdomen: benign Extremities: no edema Wound: incis healing well  Discharge Medications:  The patient has been discharged on:   1.Beta Blocker:  Yes [  y ]                              No   [   ]                              If No, reason:  2.Ace Inhibitor/ARB: Yes [   ]                                     No  [ n   ]                                     If No, reason: Renal insufficiency  3.Statin:   Yes y[   ]                  No  [   ]                  If No, reason:  4.Ecasa:  Yes  [email or web address]   ]                  No   [   ]  If No, reason:  Patient had ACS upon admission:n  Plavix/P2Y12 inhibitor: Yes [   ]                                      No  [  n ] patient is on apixaban  for atrial fibrillation     Discharge Instructions     AMB Referral to Cardiac Rehabilitation - Phase II   Complete by: As directed    Diagnosis:  Valve Replacement CABG     Valve: Aortic   CABG X ___: 2   After initial evaluation and assessments completed: Virtual Based Care may be  provided alone or in conjunction with Phase 2 Cardiac Rehab based on patient barriers.: Yes   Intensive Cardiac Rehabilitation (ICR) [facility] location only OR Traditional Cardiac Rehabilitation (TCR) *If criteria for ICR are not met will enroll in TCR [facility] only): Yes   Discharge patient   Complete by: As directed    Discharge disposition: 01-Home or Self Care   Discharge patient date: 12/12/2023      Allergies as of 12/12/2023       Reactions   Vytorin [ezetimibe -simvastatin] Other (See Comments)   back pain        Medication List     STOP taking these medications    amLODipine  10 MG tablet Commonly known as: NORVASC    hydrochlorothiazide  25 MG tablet Commonly known as: HYDRODIURIL    metoprolol  succinate 50 MG 24 hr tablet Commonly known as: TOPROL -XL   Unisom Sleepgels 50 MG Caps Generic drug: diphenhydrAMINE  HCl (Sleep)       TAKE these medications    amiodarone  200 MG tablet Commonly known as: PACERONE  Take 2 tablets (400 mg total) by mouth 3 (three) times daily. For 3 days, then twice daily for 3 days, then once daily for 3 days, then 200 mg daily   apixaban  5 MG Tabs tablet Commonly known as: ELIQUIS  Take 1 tablet (5 mg total) by mouth 2 (two) times daily.   aspirin  EC 81 MG tablet Take 1 tablet (81 mg total) by mouth daily. Swallow whole.   ezetimibe  10 MG tablet Commonly known as: ZETIA  Take 1 tablet (10 mg total) by mouth daily. What changed: when to take this   furosemide  40 MG tablet Commonly known as: LASIX  Take 1 tablet (40 mg total) by mouth daily.   levothyroxine  25 MCG tablet Commonly known as: SYNTHROID  TAKE 1 TABLET BY MOUTH EVERY DAY BEFORE BREAKFAST   metoprolol  tartrate 50 MG tablet Commonly known as: LOPRESSOR  Take 1 tablet (50 mg total) by mouth 2 (two) times daily.   multivitamin with minerals Tabs tablet Take 1 tablet by mouth in the morning. Centrum Silver   oxyCODONE  5 MG immediate release tablet Commonly known as: Oxy  IR/ROXICODONE  Take 1 tablet (5 mg total) by mouth every 6 (six) hours as needed for severe pain (pain score 7-10).   potassium chloride  SA 20 MEQ tablet Commonly known as: KLOR-CON  M Take 2 tablets (40 mEq total) by mouth daily.   rosuvastatin  40 MG tablet Commonly known as: CRESTOR  Take 1 tablet (40 mg total) by mouth daily. What changed: when to take this         Signed:  Dayvin Aber E Gregoria Selvy, PA-C  12/12/2023, 10:13 AM

## 2023-12-06 NOTE — Transfer of Care (Signed)
 Immediate Anesthesia Transfer of Care Note  Patient: [name]  Procedure(s) Performed: AORTIC VALVE REPLACEMENT (AVR) USING INSPIRIS AORTIC VALVE SIZE 23 MM (Chest) CORONARY ARTERY BYPASS GRAFTING (CABG) TIMES TWO USING LEFT INTERNAL MAMMARY ARTERY AND ENDOSCOPICALLY HARVESTED RIGHT GREATER SAPHENOUS VEIN (Chest) TRANSESOPHAGEAL ECHOCARDIOGRAM (TEE)  Patient Location: ICU  Anesthesia Type:General  Level of Consciousness: sedated and unresponsive  Airway & Oxygen Therapy: Patient remains intubated per anesthesia plan and Patient placed on Ventilator (see vital sign flow sheet for setting)  Post-op Assessment: Report given to RN and Post -op Vital signs reviewed and stable  Post vital signs: Reviewed and stable  Last Vitals:  Vitals Value Taken Time  BP 92/47 12/06/23 1313  Temp    Pulse 80 12/06/23 1320  Resp 16 12/06/23 1320  SpO2 91 % 12/06/23 1320  Vitals shown include unfiled device data.  Last Pain:  Vitals:   12/06/23 0617  PainSc: 0-No pain         Complications: No notable events documented.

## 2023-12-06 NOTE — Interval H&P Note (Signed)
 History and Physical Interval Note:  12/06/2023 6:21 AM  [name]  has presented today for surgery, with the diagnosis of SEVERE AS CAD.  The various methods of treatment have been discussed with the patient and family. After consideration of risks, benefits and other options for treatment, the patient has consented to  Procedure(s): AORTIC VALVE REPLACEMENT (AVR) (N/A) CORONARY ARTERY BYPASS GRAFTING (CABG) (N/A) TRANSESOPHAGEAL ECHOCARDIOGRAM (TEE) (N/A) as a surgical intervention.  The patient's history has been reviewed, patient examined, no change in status, stable for surgery.  I have reviewed the patient's chart and labs.  Questions were answered to the patient's satisfaction.     Deward Kallman

## 2023-12-06 NOTE — Anesthesia Procedure Notes (Signed)
 Arterial Line Insertion Start/End2/04/2024 7:00 AM, 12/06/2023 7:15 AM Performed by: Richarda Silvano HERO, CRNA, CRNA  Patient location: Pre-op. Preanesthetic checklist: patient identified, IV checked, site marked, risks and benefits discussed, surgical consent, monitors and equipment checked, pre-op evaluation, timeout performed and anesthesia consent Lidocaine  1% used for infiltration Left, radial was placed Catheter size: 20 G Hand hygiene performed  and maximum sterile barriers used   Attempts: 3 Procedure performed without using ultrasound guided technique. Following insertion, dressing applied and Biopatch. Post procedure assessment: normal and unchanged  Patient tolerated the procedure well with no immediate complications.

## 2023-12-06 NOTE — Anesthesia Procedure Notes (Signed)
 Procedure Name: Intubation Date/Time: 12/06/2023 7:48 AM  Performed by: Carolee Lauraine DASEN, CRNAPre-anesthesia Checklist: Patient identified, Emergency Drugs available, Suction available and Patient being monitored Patient Re-evaluated:Patient Re-evaluated prior to induction Oxygen Delivery Method: Circle System Utilized Preoxygenation: Pre-oxygenation with 100% oxygen Induction Type: IV induction Ventilation: Mask ventilation without difficulty and Oral airway inserted - appropriate to patient size Laryngoscope Size: Cleotilde and 2 Grade View: Grade I Tube type: Oral Tube size: 8.0 mm Number of attempts: 1 Airway Equipment and Method: Stylet and Oral airway Placement Confirmation: ETT inserted through vocal cords under direct vision, positive ETCO2 and breath sounds checked- equal and bilateral Secured at: 23 cm Tube secured with: Tape Dental Injury: Teeth and Oropharynx as per pre-operative assessment

## 2023-12-06 NOTE — Anesthesia Procedure Notes (Signed)
 Central Venous Catheter Insertion Performed by: Maryclare Cornet, MD, anesthesiologist Start/End2/04/2024 7:10 AM, 12/06/2023 7:12 AM Patient location: Pre-op. Preanesthetic checklist: patient identified, IV checked, site marked, risks and benefits discussed, surgical consent, monitors and equipment checked, pre-op evaluation, timeout performed and anesthesia consent Hand hygiene performed  and maximum sterile barriers used  PA cath was placed.Swan type:thermodilution Procedure performed without using ultrasound guided technique. Attempts: 1 Patient tolerated the procedure well with no immediate complications.

## 2023-12-06 NOTE — Hospital Course (Addendum)
  Luis Wilbert SAUNDERS, MD [place], Laine LABOR, MD   History of Present Illness:   At time of CT surgical evaluation. 78 yo male with increasing DOE and occasional CP. Pt reports that last summer these symptoms had occurred with moderate activity. He had curtailed these activities to feel better but underwent a Coronary CTA last fall with concerns for CAD. Pt underwent cath with moderate LAD/Diag disease and echo with moderate AS with a AVA of 1.1cm2. He was attempted to be enrolled in the PROGRESS trial but with LVOT calcium  was not a candidate. Pt was treated medically however pt had ongoing and progressive symptoms to where a repeat echo this past December revealed his AS had progressed to severe with a mean gradient of and an AVA of 1.0cm2. Pt was felt to require AVR and PET stress test was performed to try to evaluate his CAD which was felt to be non ischemic.  It was Dr. Adriana recommendation to proceed with AVR/CABG and the patient was admitted electively for the procedure.  Hospital course: The patient was taken the operating room on 12/06/2023 at which time he underwent aortic valve replacement with a 23 mm Inspiris pericardial valve and CABG x 2 with the LIMA to the LAD and reversed saphenous vein graft from aorta to diagonal.  He tolerated the procedure well was taken to the surgical intensive care unit in stable condition. He was extubated the evening of surgery. He was weaned off Neo Synephrine drip. He was transfused for blood loss anemia. Swan and a line were removed on post op day one. Chest tubes and foley on day two. He was transitioned off the Insulin  drip. His pre op HGA1C was 5.7. 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 He had AKI post op. Creatinine went as high as 2.15, likely secondary to pump run and hypotension.  It has improved over time.  Most recent value on 12/12/2023 is 1.54.  He had expected post op blood loss anemia requiring transfusion 1U PRBCs. He developed atrial fibrillation with RVR  but chemically converted to NSR with IV Amiodarone . This was later transitioned to PO Amiodarone . He was routinely diuresed.  We will continue for a short-term postop as he has some small pleural effusions.  Oxygen was weaned and he maintains good saturations on room air.  Incisions are healing well without evidence of infection.  He is tolerating diet and routine activities using standard protocols.  He has a T CTS home health plan with [facility] with arrangements in place.  Overall, at the time of discharge patient is felt to be quite stable

## 2023-12-06 NOTE — Progress Notes (Signed)
  Echocardiogram Echocardiogram Transesophageal has been performed.  [name] 12/06/2023, 8:51 AM

## 2023-12-06 NOTE — Progress Notes (Signed)
 Patient ID: Claude Waldman, male   DOB: [birth year], 78 y.o.   MRN: [id number]  TCTS Evening Rounds:   Hemodynamically stable, AV paced at 80, sinus brady with long pr underneath. CI = 2.65  Extubated and alert.  Urine output good  CT output low  CBC    Component Value Date/Time   WBC 10.0 12/06/2023 1322   RBC 3.06 (L) 12/06/2023 1322   HGB 9.5 (L) 12/06/2023 1323   HGB 14.5 07/10/2023 1145   HCT 28.0 (L) 12/06/2023 1323   HCT 42.3 07/10/2023 1145   PLT 101 (L) 12/06/2023 1322   PLT 136 (L) 07/10/2023 1145   MCV 89.2 12/06/2023 1322   MCV 96 07/10/2023 1145   MCH 32.7 12/06/2023 1322   MCHC 36.6 (H) 12/06/2023 1322   RDW 12.9 12/06/2023 1322   RDW 12.6 07/10/2023 1145   LYMPHSABS 2.3 03/15/2023 0753   MONOABS 0.6 03/15/2023 0753   EOSABS 0.4 03/15/2023 0753   BASOSABS 0.1 03/15/2023 0753     BMET    Component Value Date/Time   NA 140 12/06/2023 1323   NA 139 07/10/2023 1145   K 4.1 12/06/2023 1323   CL 103 12/06/2023 1211   CO2 26 12/04/2023 1314   GLUCOSE 158 (H) 12/06/2023 1211   BUN 15 12/06/2023 1211   BUN 19 07/10/2023 1145   CREATININE 0.70 12/06/2023 1211   CALCIUM  9.5 12/04/2023 1314   EGFR 79 07/10/2023 1145   GFRNONAA >60 12/04/2023 1314     A/P:  Stable postop course. Continue current plans

## 2023-12-06 NOTE — Procedures (Signed)
 Extubation Procedure Note  Patient Details:   Name: [name] DOB: [birth year] MRN: [id number]   Airway Documentation:    Vent end date: 12/06/23 Vent end time: 1735   Evaluation  O2 sats: stable throughout Complications: No apparent complications Patient did tolerate procedure well. Bilateral Breath Sounds: Clear   Yes  Pt extubated per rapid wean protocol to 4L [name]. Pt passed all parameters with a NIF-20 and VC 0.9L. Pt had a positive cuff leak, good cough, able to state name and no stridor noted.   Shan LITTIE Collum 12/06/2023, 6:00 PM

## 2023-12-06 NOTE — Anesthesia Preprocedure Evaluation (Signed)
 Anesthesia Evaluation  Patient identified by MRN, date of birth, ID band Patient awake    Reviewed: Allergy & Precautions, H&P , NPO status , Patient's Chart, lab work & pertinent test results  Airway Mallampati: II   Neck ROM: full    Dental   Pulmonary neg pulmonary ROS   breath sounds clear to auscultation       Cardiovascular hypertension, + CAD  + Valvular Problems/Murmurs AS  Rhythm:regular Rate:Normal  Severe AS, moderate MR, moderate TR. Normal LV function.   Neuro/Psych    GI/Hepatic   Endo/Other  Hypothyroidism    Renal/GU      Musculoskeletal  (+) Arthritis ,    Abdominal   Peds  Hematology   Anesthesia Other Findings   Reproductive/Obstetrics                             Anesthesia Physical Anesthesia Plan  ASA: 3  Anesthesia Plan: General   Post-op Pain Management:    Induction: Intravenous  PONV Risk Score and Plan: 2 and Ondansetron , Dexamethasone , Midazolam  and Treatment may vary due to age or medical condition  Airway Management Planned: Oral ETT  Additional Equipment: Arterial line, CVP, PA Cath, TEE and Ultrasound Guidance Line Placement  Intra-op Plan:   Post-operative Plan: Post-operative intubation/ventilation  Informed Consent: I have reviewed the patients History and Physical, chart, labs and discussed the procedure including the risks, benefits and alternatives for the proposed anesthesia with the patient or authorized representative who has indicated his/her understanding and acceptance.     Dental advisory given  Plan Discussed with: CRNA, Anesthesiologist and Surgeon  Anesthesia Plan Comments:        Anesthesia Quick Evaluation

## 2023-12-06 NOTE — Op Note (Signed)
 CARDIOVASCULAR SURGERY OPERATIVE NOTE  12/06/2023 Hercules Hasler [id number]  Surgeon:  Deward LELON Kallman, MD  First Assistant: Lemond Cera [facility]                               An experienced assistant was required given the complexity of this surgery and the standard of surgical care. The assistant was needed for exposure, dissection, suctioning, retraction of delicate tissues and sutures, instrument exchange and for overall help during this procedure.     Preoperative Diagnosis:Aortic Stenosis                                          Coronary artery disease    Postoperative Diagnosis:  Same   Procedure: Aortic Valve Replacement with a 23mm Inspiris Pericardial Valve Coronary Artery Bypass x 2 with the LIMA to the LAD and RSVG from aorta to DIAG  Anesthesia:  General Endotracheal   Clinical History/Surgical Indication:  78 yo male with NYHA class 2-3 symptoms of now severe AS with moderate CAD and normal LV function. Pt has a class I indication for AVR and after a long discussion over his associated CAD, I felt it would probably best to perform SAVR with CABG to LAD/Diag and remove his CAD from his potential symptom complex and have better long term results than TAVR/PCI.   Findings: Left ventricle had normal ventricular function.  There was severe aortic stenosis the coronary anatomy had the vessels on the surface of the heart the diagonal was a 2 mm vessel with mild disease and the LAD was a 2 mm vessel with mild disease the saphenous vein and mammary artery are excellent for bypass aortic valve was trileaflet and heavily calcified.  The aortic root also had significant amount of aortic plaquing which required endarterectomy.  At the conclusion of the procedure there was a well-functioning ventricle atrially paced rhythm and with a well-seated aortic valve prosthesis with a mean gradient of 4 mmHg.  Preparation:  The patient was seen in the preoperative holding area and the correct  patient, correct operation were confirmed with the patient after reviewing the medical record and catheterization. The consent was signed by me. Preoperative antibiotics were given. A pulmonary arterial line and radial arterial line were placed by the anesthesia team. The patient was taken back to the operating room and positioned supine on the operating room table. After being placed under general endotracheal anesthesia by the anesthesia team a foley catheter was placed. The neck, chest, abdomen, and both legs were prepped with betadine soap and solution and draped in the usual sterile manner. A surgical time-out was taken and the correct patient and operative procedure were confirmed with the nursing and anesthesia staff.  Operation: A median sternotomy incision was then created sternal above the sternal saw.  Simultaneously from lower extremity saphenous vein was harvested utilizing the endovascular technique and this wound was closed multilayer's was absorbable suture and this was performed by the assistant. Left internal mammary artery was harvested and packed in papaverine  soaked sponge.  Heparin  was delivered.  Pericardial well was developed.  Aorta was cannulated with a 21 Starns aortic cannula and a two-stage cannulas placed in right atrium for venous return.  With adequate confirmation of anticoagulation Cardioquin bypass was instituted.  Antegrade cardioplegia cath was placed in the ascending aorta and a  left ventricular sump was placed across right superior pulmonary vein. Aortic cross-clamp was placed and cold [place] blood cardioplegia was delivered for approximately 1200 cc.  Additional 300 cc was delivered approximately 90 minutes following cross-clamp and a reanimation dose was delivered just prior to cross-clamp removal. Diagonal coronary artery was open utilizing a piece of reverse saphenous vein graft and end-to-side anastomosis was constructed utilizing 7-0 Prolene sutures.  It was  flushed de-aired and found hemostatic.  The mammary artery was then anastomosed to the LAD in an end-to-side fashion lysing 8-0 Prolene suture.  It was flushed de-aired and found hemostatic.  The pedicle was pexed to the anterior surface 5-0 Prolene suture. Aortotomy was then performed for the aortic valve replacement.  It was obvious there was extensive calcification of the root which was debrided and some portions endarterectomized of the plaque.  Aortic valve was identified photographed and then resected.  There was excessive calcification in the left coronary sinus extending into the muscle below.  This was all excised.  Utilizing seventeen 2-0 [place] pledgeted sutures with the pledgets on the ventricular side a 23 mm Inspiris pericardial valve was then secured with the cor knot system. The aortotomy was closed with a running 4 oh pledgeted Prolene suture.  The proximal vein graft to the diagonal was then brought out through a 4.0 mm aortic punch.  Proximal vein graft marker was utilized.  Aortic cross-clamp was removed and multiple de-airing maneuvers were performed.  Ventricular and atrial pacing wires were placed and brought out inferior stab wounds and secured. Following adequate de-airing the left ventricular sump was removed and the patient was then weaned from cardiopulmonary bypass on inotropic support.  With adequate hemodynamics protamine  was delivered and the patient was decannulated sites oversewn were necessary.  Chest tubes were brought out through inferior stab wounds and secured and with adequate hemostasis the sternum was reapproximated with interrupted stainless steel wire and the presternal subcutaneous tissue and skin were closed with layers absorbable suture.  Sterile dressings were applied.

## 2023-12-06 NOTE — Consult Note (Signed)
 NAME:  Jacques Fife, MRN:  [id number], DOB:  07-19-1946, LOS: 0 ADMISSION DATE:  12/06/2023, CONSULTATION DATE: 12/06/2023 REFERRING MD: Dr. Deward Kallman, CHIEF COMPLAINT: Status post CABG and aortic valve replacement  History of Present Illness:  78 year old male with hypertension, hyperlipidemia and prediabetes who initially presented with increasing dyspnea on exertion and occasional chest pain, underwent workup which showed multivessel coronary artery disease and moderate to severe aortic stenosis.  LV systolic function was normal with grade 2 diastolic dysfunction.  Today patient underwent CABG x 2 and bioprosthetic aortic valve replacement.  He remained intubated and was transferred to ICU.  PCCM was consulted for intervention medical management  Pertinent  Medical History   Past Medical History:  Diagnosis Date   Arthritis    LEFT knee   Back pain    Coronary artery disease    Diverticulosis    Heart murmur    History of colon polyps    Hyperlipidemia    on meds   Hypertension    on meds   Hypothyroidism    Moderate aortic stenosis    Pre-diabetes    Thyroid  disease    on meds     Significant Hospital Events: Including procedures, antibiotic start and stop dates in addition to other pertinent events     Interim History / Subjective:  As above  Objective   Blood pressure (!) 92/47, pulse 80, temperature 98.4 F (36.9 C), resp. rate 16, height 5' 8 (1.727 m), weight 101.2 kg, SpO2 98%. PAP: (16)/(10) 16/10  Vent Mode: SIMV;PSV;PRVC FiO2 (%):  [50 %] 50 % Set Rate:  [16 bmp] 16 bmp Vt Set:  [550 mL] 550 mL PEEP:  [5 cmH20] 5 cmH20 Pressure Support:  [10 cmH20] 10 cmH20 Plateau Pressure:  [15 cmH20] 15 cmH20   Intake/Output Summary (Last 24 hours) at 12/06/2023 1340 Last data filed at 12/06/2023 1251 Gross per 24 hour  Intake 3050 ml  Output 1385 ml  Net 1665 ml   Filed Weights   12/06/23 0547  Weight: 101.2 kg    Examination: General: Crtitically  ill-appearing male, orally intubated HEENT: [place]/AT, eyes anicteric.  ETT and OGT in place Neuro: Sedated, not following commands.  Eyes are closed.  Pupils 3 mm bilateral reactive to light Chest: Central sternotomy incision looks clean and dry.  Coarse breath sounds, no wheezes or rhonchi.  Mediastinal and chest tube in place Heart: A paced rhythm, no murmurs or gallops Abdomen: Soft, nondistended, bowel sounds present Skin: No rash  Labs and images reviewed  Resolved Hospital Problem list     Assessment & Plan:  Coronary artery disease s/p CABG x 2 Moderate to severe aortic stenosis status post bioprosthetic aortic valve replacement Continue aspirin  and statin Chest tube management TCTS Continue to titrate Precedex  with RASS goal 0/-1 Continue pain control with tramadol , oxycodone  and morphine  Closely monitor chest tube output  Acute respiratory insufficiency, postop Continue on protective ventilation VAP prevention bundle in place Rapid weaning protocol ordered is in place  Chronic HFpEF Monitor intake and output EF 60 to 65% with grade 2 diastolic dysfunction GDMT once able to tolerate  Hypertension Holding antihypertensive for now, as patient is requiring low-dose phenylephrine   Hyperlipidemia Continue Crestor   Pre-Diabetes type 2 Patient hemoglobin A1c is 5.7 Currently on insulin  infusion, monitor fingerstick with goal 140-180  Expected perioperative blood loss anemia 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 Monitor H/H and PLT counts   Best Practice (right click and Reselect all SmartList Selections daily)  Diet/type: NPO  DVT prophylaxis: SCD GI prophylaxis: PPI Lines: Central line, Arterial Line, and yes and it is still needed Foley:  Yes, and it is still needed Code Status:  full code Last date of multidisciplinary goals of care discussion [Per primary team]  Labs   CBC: Recent Labs  Lab 12/04/23 1314 12/06/23 0750 12/06/23 1117 12/06/23 1130 12/06/23 1211  12/06/23 1214 12/06/23 1323  WBC 6.7  --   --   --   --   --   --   HGB 13.8   < > 9.2* 9.4* 8.5* 9.5* 9.5*  HCT 39.0   < > 27.0* 25.6* 25.0* 28.0* 28.0*  MCV 90.1  --   --   --   --   --   --   PLT 159  --   --  126*  --   --   --    < > = values in this interval not displayed.    Basic Metabolic Panel: Recent Labs  Lab 12/04/23 1314 12/06/23 0750 12/06/23 0754 12/06/23 0908 12/06/23 0925 12/06/23 0953 12/06/23 1018 12/06/23 1047 12/06/23 1117 12/06/23 1211 12/06/23 1214 12/06/23 1323  NA 138 138   < > 138   < > 137   < > 136 137 138 139 140  K 3.8 4.0   < > 4.1   < > 4.8   < > 4.6 4.3 4.0 4.0 4.1  CL 100 103  --  102  --  101  --  102  --  103  --   --   CO2 26  --   --   --   --   --   --   --   --   --   --   --   GLUCOSE 186* 136*  --  129*  --  136*  --  188*  --  158*  --   --   BUN 18 16  --  16  --  17  --  15  --  15  --   --   CREATININE 0.97 0.70  --  0.90  --  0.80  --  0.70  --  0.70  --   --   CALCIUM  9.5  --   --   --   --   --   --   --   --   --   --   --    < > = values in this interval not displayed.   GFR: Estimated Creatinine Clearance: 92 mL/min (by C-G formula based on SCr of 0.7 mg/dL). Recent Labs  Lab 12/04/23 1314  WBC 6.7    Liver Function Tests: Recent Labs  Lab 12/04/23 1314  AST 47*  ALT 48*  ALKPHOS 62  BILITOT 1.1  PROT 7.4  ALBUMIN  4.4   No results for input(s): LIPASE, AMYLASE in the last 168 hours. No results for input(s): AMMONIA in the last 168 hours.  ABG    Component Value Date/Time   PHART 7.375 12/06/2023 1323   PCO2ART 39.3 12/06/2023 1323   PO2ART 61 (L) 12/06/2023 1323   HCO3 23.2 12/06/2023 1323   TCO2 24 12/06/2023 1323   ACIDBASEDEF 2.0 12/06/2023 1323   O2SAT 92 12/06/2023 1323     Coagulation Profile: Recent Labs  Lab 12/04/23 1314  INR 1.0    Cardiac Enzymes: No results for input(s): CKTOTAL, CKMB, CKMBINDEX, TROPONINI in the last 168 hours.  HbA1C: Hgb A1c MFr Bld  Date/Time Value Ref Range Status  12/04/2023 01:14 PM 5.7 (H) 4.8 - 5.6 % Final    Comment:    (NOTE) Pre diabetes:          5.7%-6.4%  Diabetes:              >6.4%  Glycemic control for   <7.0% adults with diabetes   07/05/2023 08:37 AM 5.8 4.6 - 6.5 % Final    Comment:    Glycemic Control Guidelines for People with Diabetes:Non Diabetic:  <6%Goal of Therapy: <7%Additional Action Suggested:  >8%     CBG: No results for input(s): GLUCAP in the last 168 hours.  Review of Systems:   Unable to obtain as patient is intubated and sedated  Past Medical History:  He,  has a past medical history of Arthritis, Back pain, Coronary artery disease, Diverticulosis, Heart murmur, History of colon polyps, Hyperlipidemia, Hypertension, Hypothyroidism, Moderate aortic stenosis, Pre-diabetes, and Thyroid  disease.   Surgical History:   Past Surgical History:  Procedure Laterality Date   COLONOSCOPY  2018   SA-MAC-[id number]-ageq with lavage-tics/int hems/TA x 5 frags   INGUINAL HERNIA REPAIR Bilateral 02/13/2017   Procedure: LAPAROSCOPIC BILATERAL INGUINAL HERNIA REPAIR WITH MESH;  Surgeon: Vicenta Poli, MD;  Location: [facility] OR;  Service: General;  Laterality: Bilateral;   LASIK     POLYPECTOMY  2018   TA x 5   RIGHT HEART CATH AND CORONARY ANGIOGRAPHY N/A 07/03/2023   Procedure: RIGHT HEART CATH AND CORONARY ANGIOGRAPHY;  Surgeon: Wendel Lurena POUR, MD;  Location: [facility] INVASIVE CV LAB;  Service: Cardiovascular;  Laterality: N/A;   UMBILICAL HERNIA REPAIR N/A 02/13/2017   Procedure: LAPAROSCOPIC UMBILICAL HERNIA REPAIR;  Surgeon: Vicenta Poli, MD;  Location: [facility] OR;  Service: General;  Laterality: N/A;   WISDOM TOOTH EXTRACTION       Social History:   reports that he has never smoked. He has never used smokeless tobacco. He reports current alcohol use of about 21.0 - 28.0 standard drinks of alcohol per week. He reports that he does not use drugs.   Family History:  His family history  includes Cancer in his mother; Diabetes in his father; Emphysema in his father; HIV in his brother. There is no history of Colon cancer, Colon polyps, Esophageal cancer, Rectal cancer, or Stomach cancer.   Allergies Allergies  Allergen Reactions   Vytorin [Ezetimibe -Simvastatin] Other (See Comments)    back pain     Home Medications  Prior to Admission medications   Medication Sig Start Date End Date Taking? Authorizing Provider  amLODipine  (NORVASC ) 10 MG tablet TAKE 1 TABLET BY MOUTH EVERY DAY 12/05/23  Yes Tower, Laine LABOR, MD  aspirin  EC 81 MG tablet Take 1 tablet (81 mg total) by mouth daily. Swallow whole. 06/24/23  Yes Turner, Wilbert SAUNDERS, MD  diphenhydrAMINE  HCl, Sleep, (UNISOM SLEEPGELS) 50 MG CAPS Take 50 mg by mouth at bedtime.   Yes [provider]  ezetimibe  (ZETIA ) 10 MG tablet Take 1 tablet (10 mg total) by mouth daily. Patient taking differently: Take 10 mg by mouth every evening. 09/23/23 12/22/23 Yes Turner, Wilbert SAUNDERS, MD  hydrochlorothiazide  (HYDRODIURIL ) 25 MG tablet Take 1 tablet (25 mg total) by mouth daily. 07/10/23  Yes Tower, Laine LABOR, MD  levothyroxine  (SYNTHROID ) 25 MCG tablet TAKE 1 TABLET BY MOUTH EVERY DAY BEFORE BREAKFAST 11/22/23  Yes Tower, Laine LABOR, MD  metoprolol  succinate (TOPROL -XL) 50 MG 24 hr tablet TAKE 1 TABLET BY MOUTH DAILY. TAKE WITH OR IMMEDIATELY FOLLOWING A  MEAL. Patient taking differently: Take 50 mg by mouth every evening. TAKE WITH OR IMMEDIATELY FOLLOWING A MEAL. 10/08/23  Yes Tower, Laine LABOR, MD  Multiple Vitamin (MULTIVITAMIN WITH MINERALS) TABS tablet Take 1 tablet by mouth in the morning. Centrum Silver   Yes [provider]  rosuvastatin  (CRESTOR ) 40 MG tablet Take 1 tablet (40 mg total) by mouth daily. Patient taking differently: Take 40 mg by mouth every evening. 07/05/23  Yes Tower, Laine LABOR, MD     Critical care time:     The patient is critically ill due to status post CABG and AVR.  Critical care was necessary to treat or  prevent imminent or life-threatening deterioration.  Critical care was time spent personally by me on the following activities: development of treatment plan with patient and/or surrogate as well as nursing, discussions with consultants, evaluation of patient's response to treatment, examination of patient, obtaining history from patient or surrogate, ordering and performing treatments and interventions, ordering and review of laboratory studies, ordering and review of radiographic studies, pulse oximetry, re-evaluation of patient's condition and participation in multidisciplinary rounds.   During this encounter critical care time was devoted to patient care services described in this note for 37 minutes.     Valinda Novas, MD [facility] See Amion for pager If no response to pager, please call [phone] until 7pm After 7pm, Please call E-link [phone]

## 2023-12-07 ENCOUNTER — Inpatient Hospital Stay (HOSPITAL_COMMUNITY): Payer: Medicare HMO

## 2023-12-07 LAB — CBC
HCT: 24.6 % — ABNORMAL LOW (ref 39.0–52.0)
HCT: 24.8 % — ABNORMAL LOW (ref 39.0–52.0)
Hemoglobin: 8.9 g/dL — ABNORMAL LOW (ref 13.0–17.0)
Hemoglobin: 8.7 g/dL — ABNORMAL LOW (ref 13.0–17.0)
MCH: 32.4 pg (ref 26.0–34.0)
MCH: 31.5 pg (ref 26.0–34.0)
MCHC: 36.2 g/dL — ABNORMAL HIGH (ref 30.0–36.0)
MCHC: 35.1 g/dL (ref 30.0–36.0)
MCV: 89.5 fL (ref 80.0–100.0)
MCV: 89.9 fL (ref 80.0–100.0)
Platelets: 90 10*3/uL — ABNORMAL LOW (ref 150–400)
Platelets: 95 10*3/uL — ABNORMAL LOW (ref 150–400)
RBC: 2.75 MIL/uL — ABNORMAL LOW (ref 4.22–5.81)
RBC: 2.76 MIL/uL — ABNORMAL LOW (ref 4.22–5.81)
RDW: 14 % (ref 11.5–15.5)
RDW: 14.7 % (ref 11.5–15.5)
WBC: 7.8 10*3/uL (ref 4.0–10.5)
WBC: 10.5 10*3/uL (ref 4.0–10.5)
nRBC: 0 % (ref 0.0–0.2)
nRBC: 0 % (ref 0.0–0.2)

## 2023-12-07 LAB — GLUCOSE, CAPILLARY
Glucose-Capillary: 133 mg/dL — ABNORMAL HIGH (ref 70–99)
Glucose-Capillary: 146 mg/dL — ABNORMAL HIGH (ref 70–99)
Glucose-Capillary: 148 mg/dL — ABNORMAL HIGH (ref 70–99)
Glucose-Capillary: 149 mg/dL — ABNORMAL HIGH (ref 70–99)
Glucose-Capillary: 151 mg/dL — ABNORMAL HIGH (ref 70–99)
Glucose-Capillary: 152 mg/dL — ABNORMAL HIGH (ref 70–99)
Glucose-Capillary: 152 mg/dL — ABNORMAL HIGH (ref 70–99)
Glucose-Capillary: 160 mg/dL — ABNORMAL HIGH (ref 70–99)
Glucose-Capillary: 177 mg/dL — ABNORMAL HIGH (ref 70–99)
Glucose-Capillary: 184 mg/dL — ABNORMAL HIGH (ref 70–99)
Glucose-Capillary: 195 mg/dL — ABNORMAL HIGH (ref 70–99)
Glucose-Capillary: 199 mg/dL — ABNORMAL HIGH (ref 70–99)

## 2023-12-07 LAB — PROTIME-INR
INR: 1.3 — ABNORMAL HIGH (ref 0.8–1.2)
Prothrombin Time: 16.2 s — ABNORMAL HIGH (ref 11.4–15.2)

## 2023-12-07 LAB — BPAM RBC
Blood Product Expiration Date: 202503102359
Blood Product Expiration Date: 202503112359
ISSUE DATE / TIME: 202502072208
ISSUE DATE / TIME: 202502072208
Unit Type and Rh: 5100
Unit Type and Rh: 5100

## 2023-12-07 LAB — BASIC METABOLIC PANEL
Anion gap: 10 (ref 5–15)
Anion gap: 12 (ref 5–15)
BUN: 20 mg/dL (ref 8–23)
BUN: 25 mg/dL — ABNORMAL HIGH (ref 8–23)
CO2: 19 mmol/L — ABNORMAL LOW (ref 22–32)
CO2: 21 mmol/L — ABNORMAL LOW (ref 22–32)
Calcium: 7.4 mg/dL — ABNORMAL LOW (ref 8.9–10.3)
Calcium: 7.8 mg/dL — ABNORMAL LOW (ref 8.9–10.3)
Chloride: 107 mmol/L (ref 98–111)
Chloride: 103 mmol/L (ref 98–111)
Creatinine, Ser: 1.06 mg/dL (ref 0.61–1.24)
Creatinine, Ser: 1.63 mg/dL — ABNORMAL HIGH (ref 0.61–1.24)
GFR, Estimated: >60 mL/min (ref >60)
GFR, Estimated: 44 mL/min — ABNORMAL LOW (ref >60)
Glucose, Bld: 146 mg/dL — ABNORMAL HIGH (ref 70–99)
Glucose, Bld: 219 mg/dL — ABNORMAL HIGH (ref 70–99)
Potassium: 4.4 mmol/L (ref 3.5–5.1)
Potassium: 4.6 mmol/L (ref 3.5–5.1)
Sodium: 136 mmol/L (ref 135–145)
Sodium: 136 mmol/L (ref 135–145)

## 2023-12-07 LAB — TYPE AND SCREEN
ABO/RH(D): O POS
Antibody Screen: NEGATIVE
Unit division: 0
Unit division: 0

## 2023-12-07 LAB — BPAM PLATELET PHERESIS
Blood Product Expiration Date: 202502112359
ISSUE DATE / TIME: 202502072208
Unit Type and Rh: 6200

## 2023-12-07 LAB — ECHO INTRAOPERATIVE TEE
AV Mean grad: 39 mm[Hg]
Height: 68 in
Weight: 3568 [oz_av]

## 2023-12-07 LAB — PREPARE PLATELET PHERESIS: Unit division: 0

## 2023-12-07 LAB — MAGNESIUM
Magnesium: 2.9 mg/dL — ABNORMAL HIGH (ref 1.7–2.4)
Magnesium: 2.8 mg/dL — ABNORMAL HIGH (ref 1.7–2.4)

## 2023-12-07 LAB — FIBRINOGEN: Fibrinogen: 288 mg/dL (ref 210–475)

## 2023-12-07 MED ORDER — INSULIN GLARGINE-YFGN 100 UNIT/ML ~~LOC~~ SOLN
20.0000 [IU] | Freq: Every day | SUBCUTANEOUS | Status: DC
  Administered 2023-12-07: 20 [IU] via SUBCUTANEOUS
  Filled 2023-12-07 (×2): qty 0.2

## 2023-12-07 MED ORDER — INSULIN ASPART 100 UNIT/ML IJ SOLN
0.0000 [IU] | INTRAMUSCULAR | Status: DC
  Administered 2023-12-07 (×2): 4 [IU] via SUBCUTANEOUS
  Administered 2023-12-07: 2 [IU] via SUBCUTANEOUS
  Administered 2023-12-07 – 2023-12-08 (×3): 4 [IU] via SUBCUTANEOUS

## 2023-12-07 MED ORDER — PANTOPRAZOLE SODIUM 40 MG PO TBEC
40.0000 mg | DELAYED_RELEASE_TABLET | Freq: Every day | ORAL | Status: DC
  Administered 2023-12-07 – 2023-12-11 (×5): 40 mg via ORAL
  Filled 2023-12-07 (×5): qty 1

## 2023-12-07 MED ORDER — SODIUM CHLORIDE 0.9 % IV SOLN
20.0000 ug | Freq: Once | INTRAVENOUS | Status: AC
  Administered 2023-12-07: 20 ug via INTRAVENOUS
  Filled 2023-12-07: qty 5

## 2023-12-07 MED ORDER — ORAL CARE MOUTH RINSE: 15.0000 mL | OROMUCOSAL | Status: DC | PRN

## 2023-12-07 MED ORDER — FUROSEMIDE 10 MG/ML IJ SOLN
40.0000 mg | Freq: Once | INTRAMUSCULAR | Status: AC
  Administered 2023-12-07: 40 mg via INTRAVENOUS
  Filled 2023-12-07: qty 4

## 2023-12-07 NOTE — Anesthesia Postprocedure Evaluation (Signed)
 Anesthesia Post Note  Patient: [name]  Procedure(s) Performed: AORTIC VALVE REPLACEMENT (AVR) USING INSPIRIS AORTIC VALVE SIZE 23 MM (Chest) CORONARY ARTERY BYPASS GRAFTING (CABG) TIMES TWO USING LEFT INTERNAL MAMMARY ARTERY AND ENDOSCOPICALLY HARVESTED RIGHT GREATER SAPHENOUS VEIN (Chest) TRANSESOPHAGEAL ECHOCARDIOGRAM (TEE)     Patient location during evaluation: SICU Anesthesia Type: General Level of consciousness: sedated Pain management: pain level controlled Vital Signs Assessment: post-procedure vital signs reviewed and stable Respiratory status: patient remains intubated per anesthesia plan Cardiovascular status: stable Postop Assessment: no apparent nausea or vomiting Anesthetic complications: no   No notable events documented.  Last Vitals:  Vitals:   12/07/23 0615 12/07/23 0700  BP:  109/66  Pulse: 92 93  Resp: 12 14  Temp: 36.5 C (!) 36.2 C  SpO2: 93% 93%    Last Pain:  Vitals:   12/07/23 0615  TempSrc: Oral  PainSc:                  Aluna Whiston S

## 2023-12-07 NOTE — Plan of Care (Signed)
   Problem: Education: Goal: Knowledge of General Education information will improve Description Including pain rating scale, medication(s)/side effects and non-pharmacologic comfort measures Outcome: Progressing

## 2023-12-07 NOTE — Progress Notes (Signed)
 Patient ID: [name], male   DOB: 29-Nov-1945, 78 y.o.   MRN: [id number]  TCTS evening rounds:  Hemodynamically stable in sinus 92.   Sats 92% on [place].  UO ok, creat bumped to 1.63. waiting on diuresis.  CT output remains low today but still bloody, non-clotting.  Hgb stable at 8.7 this afternoon.  CBC    Component Value Date/Time   WBC 10.5 12/07/2023 1618   RBC 2.76 (L) 12/07/2023 1618   HGB 8.7 (L) 12/07/2023 1618   HGB 14.5 07/10/2023 1145   HCT 24.8 (L) 12/07/2023 1618   HCT 42.3 07/10/2023 1145   PLT 95 (L) 12/07/2023 1618   PLT 136 (L) 07/10/2023 1145   MCV 89.9 12/07/2023 1618   MCV 96 07/10/2023 1145   MCH 31.5 12/07/2023 1618   MCHC 35.1 12/07/2023 1618   RDW 14.7 12/07/2023 1618   RDW 12.6 07/10/2023 1145   LYMPHSABS 2.3 03/15/2023 0753   MONOABS 0.6 03/15/2023 0753   EOSABS 0.4 03/15/2023 0753   BASOSABS 0.1 03/15/2023 0753   BMET    Component Value Date/Time   NA 136 12/07/2023 1618   NA 139 07/10/2023 1145   K 4.6 12/07/2023 1618   CL 103 12/07/2023 1618   CO2 21 (L) 12/07/2023 1618   GLUCOSE 219 (H) 12/07/2023 1618   BUN 25 (H) 12/07/2023 1618   BUN 19 07/10/2023 1145   CREATININE 1.63 (H) 12/07/2023 1618   CALCIUM  7.8 (L) 12/07/2023 1618   EGFR 79 07/10/2023 1145   GFRNONAA 44 (L) 12/07/2023 1618   He feels well. Ambulated some today.

## 2023-12-07 NOTE — Progress Notes (Signed)
 1 Day Post-Op Procedure(s) (LRB): AORTIC VALVE REPLACEMENT (AVR) USING INSPIRIS AORTIC VALVE SIZE 23 MM (N/A) CORONARY ARTERY BYPASS GRAFTING (CABG) TIMES TWO USING LEFT INTERNAL MAMMARY ARTERY AND ENDOSCOPICALLY HARVESTED RIGHT GREATER SAPHENOUS VEIN (N/A) TRANSESOPHAGEAL ECHOCARDIOGRAM (TEE) (N/A) Subjective:  No specific complaints.  Objective: Vital signs in last 24 hours: Temp:  [96.6 F (35.9 C)-98.6 F (37 C)] 97.2 F (36.2 C) (02/08 0800) Pulse Rate:  [79-104] 94 (02/08 0800) Cardiac Rhythm: Normal sinus rhythm (02/08 0400) Resp:  [9-32] 14 (02/08 0800) BP: (92-133)/(47-73) 120/65 (02/08 0800) SpO2:  [89 %-100 %] 93 % (02/08 0800) Arterial Line BP: (77-247)/(-41-237) 134/52 (02/08 0800) FiO2 (%):  [40 %-50 %] 40 % (02/07 1700) Weight:  [106.1 kg] 106.1 kg (02/08 0500)  Hemodynamic parameters for last 24 hours: PAP: (12-53)/(6-30) 33/27 CO:  [4 L/min-7.5 L/min] 6.6 L/min CI:  [1.85 L/min/m2-3.5 L/min/m2] 3.1 L/min/m2  Intake/Output from previous day: 02/07 0701 - 02/08 0700 In: 7249.2 [P.O.:180; I.V.:2781; Blood:1444.2; IV Piggyback:2844.1] Out: 3622 [Urine:1222; Blood:820; Chest Tube:1580] Intake/Output this shift: Total I/O In: -  Out: 70 [Urine:40; Chest Tube:30]  General appearance: alert and cooperative Neurologic: intact Heart: regular rate and rhythm Lungs: clear to auscultation bilaterally Extremities: edema mild Wound: dressing dry Chest tube output bloody but thin, non-clotting.  Lab Results: Recent Labs    12/06/23 1915 12/06/23 2102 12/07/23 0445  WBC 7.4  --  7.8  HGB 8.4* 7.3* 8.9*  HCT 22.6* 19.7* 24.6*  PLT 92*  --  90*   BMET:  Recent Labs    12/06/23 1915 12/07/23 0445  NA 139 136  K 4.2 4.4  CL 108 107  CO2 22 19*  GLUCOSE 147* 146*  BUN 16 20  CREATININE 0.85 1.06  CALCIUM  7.8* 7.4*    PT/INR:  Recent Labs    12/07/23 0445  LABPROT 16.2*  INR 1.3*   ABG    Component Value Date/Time   PHART 7.391 12/06/2023 1849    HCO3 22.3 12/06/2023 1849   TCO2 23 12/06/2023 1849   ACIDBASEDEF 2.0 12/06/2023 1849   O2SAT 96 12/06/2023 1849   CBG (last 3)  Recent Labs    12/07/23 0448 12/07/23 0539 12/07/23 0639  GLUCAP 152* 195* 160*   CXR: bibasilar atelectasis. No signficant pleural effusions.  Assessment/Plan: S/P Procedure(s) (LRB): AORTIC VALVE REPLACEMENT (AVR) USING INSPIRIS AORTIC VALVE SIZE 23 MM (N/A) CORONARY ARTERY BYPASS GRAFTING (CABG) TIMES TWO USING LEFT INTERNAL MAMMARY ARTERY AND ENDOSCOPICALLY HARVESTED RIGHT GREATER SAPHENOUS VEIN (N/A) TRANSESOPHAGEAL ECHOCARDIOGRAM (TEE) (N/A)  POD 1 Hemodynamically stable in NSR. Neo weaned off overnight after transfusion. Continue po amio and Lopressor .  Chest tube output was 340 cc the first hr back from OR but then decreased for 5 hrs. Around 1700 hrs output increased to 180-200 cc/hr for 3 hrs and Hgb dropped to 8.4 from 10.4 postop and required high dose neo. I gave him 2 units of PRBC's and 1 unit of plts for plt ct of 92K. After that chest tube output has decreased to about 30/hr but still bloody. Hgb 8.9 with plt ct 90 this am. INR and fibrinogen  normal this am. Will keep chest tubes in today. Hold ASA today and give him a dose of of DDAVP . Avoid hypertension. Repeat Hgb 4 pm today.  DC swan, arterial line.  Start diuresis.  IS, OOB to chair.  Glucose under good control. Transition to SEMGLEE  and SSI. No hx of DM and normal Hgb A1c preop so should be able to stop this  in next day or so.   LOS: 1 day    Dorise MARLA Fellers 12/07/2023

## 2023-12-08 ENCOUNTER — Inpatient Hospital Stay (HOSPITAL_COMMUNITY): Payer: Medicare HMO

## 2023-12-08 LAB — GLUCOSE, CAPILLARY
Glucose-Capillary: 166 mg/dL — ABNORMAL HIGH (ref 70–99)
Glucose-Capillary: 167 mg/dL — ABNORMAL HIGH (ref 70–99)
Glucose-Capillary: 177 mg/dL — ABNORMAL HIGH (ref 70–99)
Glucose-Capillary: 158 mg/dL — ABNORMAL HIGH (ref 70–99)

## 2023-12-08 LAB — BASIC METABOLIC PANEL
Anion gap: 14 (ref 5–15)
BUN: 31 mg/dL — ABNORMAL HIGH (ref 8–23)
CO2: 20 mmol/L — ABNORMAL LOW (ref 22–32)
Calcium: 7.9 mg/dL — ABNORMAL LOW (ref 8.9–10.3)
Chloride: 100 mmol/L (ref 98–111)
Creatinine, Ser: 2.15 mg/dL — ABNORMAL HIGH (ref 0.61–1.24)
GFR, Estimated: 31 mL/min — ABNORMAL LOW (ref >60)
Glucose, Bld: 158 mg/dL — ABNORMAL HIGH (ref 70–99)
Potassium: 4.7 mmol/L (ref 3.5–5.1)
Sodium: 134 mmol/L — ABNORMAL LOW (ref 135–145)

## 2023-12-08 LAB — CBC
HCT: 23.7 % — ABNORMAL LOW (ref 39.0–52.0)
Hemoglobin: 8.3 g/dL — ABNORMAL LOW (ref 13.0–17.0)
MCH: 32.2 pg (ref 26.0–34.0)
MCHC: 35 g/dL (ref 30.0–36.0)
MCV: 91.9 fL (ref 80.0–100.0)
Platelets: 100 10*3/uL — ABNORMAL LOW (ref 150–400)
RBC: 2.58 MIL/uL — ABNORMAL LOW (ref 4.22–5.81)
RDW: 14.9 % (ref 11.5–15.5)
WBC: 12.8 10*3/uL — ABNORMAL HIGH (ref 4.0–10.5)
nRBC: 0.2 % (ref 0.0–0.2)

## 2023-12-08 MED ORDER — INSULIN ASPART 100 UNIT/ML IJ SOLN
0.0000 [IU] | Freq: Three times a day (TID) | INTRAMUSCULAR | Status: DC
  Administered 2023-12-08: 4 [IU] via SUBCUTANEOUS
  Administered 2023-12-08 – 2023-12-09 (×4): 2 [IU] via SUBCUTANEOUS
  Administered 2023-12-10: 4 [IU] via SUBCUTANEOUS
  Administered 2023-12-10 – 2023-12-11 (×2): 2 [IU] via SUBCUTANEOUS

## 2023-12-08 MED ORDER — ASPIRIN 81 MG PO TBEC
81.0000 mg | DELAYED_RELEASE_TABLET | Freq: Every day | ORAL | Status: DC
  Administered 2023-12-08 – 2023-12-12 (×5): 81 mg via ORAL
  Filled 2023-12-08 (×5): qty 1

## 2023-12-08 MED ORDER — TRAMADOL HCL 50 MG PO TABS: 50.0000 mg | ORAL_TABLET | ORAL | Status: DC | PRN

## 2023-12-08 MED ORDER — INSULIN GLARGINE-YFGN 100 UNIT/ML ~~LOC~~ SOLN
25.0000 [IU] | Freq: Every day | SUBCUTANEOUS | Status: DC
  Administered 2023-12-08 – 2023-12-12 (×5): 25 [IU] via SUBCUTANEOUS
  Filled 2023-12-08 (×5): qty 0.25

## 2023-12-08 MED ORDER — MORPHINE SULFATE (PF) 2 MG/ML IV SOLN: 1.0000 mg | INTRAVENOUS | Status: DC | PRN

## 2023-12-08 NOTE — Plan of Care (Signed)
   Problem: Education: Goal: Knowledge of General Education information will improve Description Including pain rating scale, medication(s)/side effects and non-pharmacologic comfort measures Outcome: Progressing

## 2023-12-08 NOTE — Progress Notes (Signed)
 Patient ID: [name], male   DOB: [birth year], 78 y.o.   MRN: [id number]  TCTS afternoon rounds:  Hemodynamically stable in sinus rhythm.   UO adequate.  CT output remains low today, serosanguinous.  He feels well and ambulated.

## 2023-12-08 NOTE — Progress Notes (Signed)
 2 Days Post-Op Procedure(s) (LRB): AORTIC VALVE REPLACEMENT (AVR) USING INSPIRIS AORTIC VALVE SIZE 23 MM (N/A) CORONARY ARTERY BYPASS GRAFTING (CABG) TIMES TWO USING LEFT INTERNAL MAMMARY ARTERY AND ENDOSCOPICALLY HARVESTED RIGHT GREATER SAPHENOUS VEIN (N/A) TRANSESOPHAGEAL ECHOCARDIOGRAM (TEE) (N/A) Subjective:  No complaints. Not much appetite yet. Passing gas but no BM. Ambulating some. Pain well-controlled. Has been up in chair this morning.  Objective: Vital signs in last 24 hours: Temp:  [97.5 F (36.4 C)-97.6 F (36.4 C)] 97.6 F (36.4 C) (02/09 0730) Pulse Rate:  [84-96] 92 (02/09 0700) Cardiac Rhythm: Normal sinus rhythm (02/08 2000) Resp:  [10-23] 13 (02/09 0700) BP: (96-145)/(47-75) 125/70 (02/09 0700) SpO2:  [89 %-95 %] 95 % (02/09 0700) Arterial Line BP: (116)/(54) 116/54 (02/08 0900) Weight:  [105.7 kg] 105.7 kg (02/09 0600)  Hemodynamic parameters for last 24 hours:    Intake/Output from previous day: 02/08 0701 - 02/09 0700 In: 1023.2 [P.O.:480; I.V.:193.3; IV Piggyback:349.9] Out: 944 [Urine:415; Chest Tube:529] Intake/Output this shift: No intake/output data recorded.  General appearance: alert and cooperative Neurologic: intact Heart: regular rate and rhythm Lungs: clear to auscultation bilaterally Abdomen: soft, non-tender; bowel sounds normal Extremities: edema mild Wound: dressing dry  Lab Results: Recent Labs    12/07/23 1618 12/08/23 0329  WBC 10.5 12.8*  HGB 8.7* 8.3*  HCT 24.8* 23.7*  PLT 95* 100*   BMET:  Recent Labs    12/07/23 1618 12/08/23 0329  NA 136 134*  K 4.6 4.7  CL 103 100  CO2 21* 20*  GLUCOSE 219* 158*  BUN 25* 31*  CREATININE 1.63* 2.15*  CALCIUM  7.8* 7.9*    PT/INR:  Recent Labs    12/07/23 0445  LABPROT 16.2*  INR 1.3*   ABG    Component Value Date/Time   PHART 7.391 12/06/2023 1849   HCO3 22.3 12/06/2023 1849   TCO2 23 12/06/2023 1849   ACIDBASEDEF 2.0 12/06/2023 1849   O2SAT 96 12/06/2023 1849    CBG (last 3)  Recent Labs    12/07/23 2330 12/08/23 0333 12/08/23 0740  GLUCAP 184* 166* 167*   CXR: may be small right pleural effusion, right basilar atelectasis  Assessment/Plan: S/P Procedure(s) (LRB): AORTIC VALVE REPLACEMENT (AVR) USING INSPIRIS AORTIC VALVE SIZE 23 MM (N/A) CORONARY ARTERY BYPASS GRAFTING (CABG) TIMES TWO USING LEFT INTERNAL MAMMARY ARTERY AND ENDOSCOPICALLY HARVESTED RIGHT GREATER SAPHENOUS VEIN (N/A) TRANSESOPHAGEAL ECHOCARDIOGRAM (TEE) (N/A)  POD 2 Hemodynamically stable in sinus rhythm on po amio and Lopressor .  AKI with creat up to 2.15 this am likely secondary to pump run, postop hypotension requiring high dose neo. UO 20/hr and kidney function will recover. Hold off on diuretic for now.  Wt is stable at 10 lbs over preop. I/O even yesterday.  Keep foley to monitor UO.  CT output 300/shift last two shifts. It is serosanguinous now and thinning out. Will keep chest tubes today.  Keep pacer wires today, not to complicate CT output.  Postop acute blood loss anemia: Hgb slightly lower than yesterday afternoon but not much change. Continue to follow. On iron.  Glucose still a little elevated. Increase SEMGLEE   and continue SSI.  Continue IS, ambulation.   LOS: 2 days    Dorise MARLA Fellers 12/08/2023

## 2023-12-09 ENCOUNTER — Inpatient Hospital Stay (HOSPITAL_COMMUNITY): Payer: Medicare HMO

## 2023-12-09 ENCOUNTER — Encounter (HOSPITAL_COMMUNITY): Payer: Self-pay | Admitting: Thoracic Surgery (Cardiothoracic Vascular Surgery)

## 2023-12-09 DIAGNOSIS — E871 Hypo-osmolality and hyponatremia: Secondary | ICD-10-CM

## 2023-12-09 DIAGNOSIS — K5900 Constipation, unspecified: Secondary | ICD-10-CM

## 2023-12-09 DIAGNOSIS — Z952 Presence of prosthetic heart valve: Secondary | ICD-10-CM | POA: Diagnosis not present

## 2023-12-09 DIAGNOSIS — E039 Hypothyroidism, unspecified: Secondary | ICD-10-CM

## 2023-12-09 DIAGNOSIS — N179 Acute kidney failure, unspecified: Secondary | ICD-10-CM | POA: Diagnosis not present

## 2023-12-09 LAB — BASIC METABOLIC PANEL
Anion gap: 16 — ABNORMAL HIGH (ref 5–15)
BUN: 42 mg/dL — ABNORMAL HIGH (ref 8–23)
CO2: 20 mmol/L — ABNORMAL LOW (ref 22–32)
Calcium: 8.3 mg/dL — ABNORMAL LOW (ref 8.9–10.3)
Chloride: 95 mmol/L — ABNORMAL LOW (ref 98–111)
Creatinine, Ser: 1.8 mg/dL — ABNORMAL HIGH (ref 0.61–1.24)
GFR, Estimated: 39 mL/min — ABNORMAL LOW (ref >60)
Glucose, Bld: 161 mg/dL — ABNORMAL HIGH (ref 70–99)
Potassium: 3.9 mmol/L (ref 3.5–5.1)
Sodium: 131 mmol/L — ABNORMAL LOW (ref 135–145)

## 2023-12-09 LAB — GLUCOSE, CAPILLARY
Glucose-Capillary: 186 mg/dL — ABNORMAL HIGH (ref 70–99)
Glucose-Capillary: 139 mg/dL — ABNORMAL HIGH (ref 70–99)
Glucose-Capillary: 150 mg/dL — ABNORMAL HIGH (ref 70–99)

## 2023-12-09 LAB — CBC
HCT: 21.3 % — ABNORMAL LOW (ref 39.0–52.0)
Hemoglobin: 7.3 g/dL — ABNORMAL LOW (ref 13.0–17.0)
MCH: 31.7 pg (ref 26.0–34.0)
MCHC: 34.3 g/dL (ref 30.0–36.0)
MCV: 92.6 fL (ref 80.0–100.0)
Platelets: 74 10*3/uL — ABNORMAL LOW (ref 150–400)
RBC: 2.3 MIL/uL — ABNORMAL LOW (ref 4.22–5.81)
RDW: 14.4 % (ref 11.5–15.5)
WBC: 8.3 10*3/uL (ref 4.0–10.5)
nRBC: 0.4 % — ABNORMAL HIGH (ref 0.0–0.2)

## 2023-12-09 MED ORDER — AMIODARONE HCL IN DEXTROSE 360-4.14 MG/200ML-% IV SOLN
30.0000 mg/h | INTRAVENOUS | Status: DC
  Administered 2023-12-10: 30 mg/h via INTRAVENOUS
  Filled 2023-12-09: qty 200

## 2023-12-09 MED ORDER — AMIODARONE LOAD VIA INFUSION
150.0000 mg | Freq: Once | INTRAVENOUS | Status: AC
  Administered 2023-12-09: 150 mg via INTRAVENOUS
  Filled 2023-12-09: qty 83.34

## 2023-12-09 MED ORDER — AMIODARONE HCL IN DEXTROSE 360-4.14 MG/200ML-% IV SOLN
60.0000 mg/h | INTRAVENOUS | Status: DC
  Administered 2023-12-09: 60 mg/h via INTRAVENOUS
  Filled 2023-12-09 (×2): qty 200

## 2023-12-09 MED ORDER — POLYETHYLENE GLYCOL 3350 17 G PO PACK
17.0000 g | PACK | Freq: Every day | ORAL | Status: AC
  Administered 2023-12-09 – 2023-12-10 (×2): 17 g via ORAL
  Filled 2023-12-09 (×2): qty 1

## 2023-12-09 MED ORDER — METOPROLOL TARTRATE 25 MG PO TABS
25.0000 mg | ORAL_TABLET | Freq: Two times a day (BID) | ORAL | Status: DC
Start: 2023-12-09 — End: 2023-12-11
  Administered 2023-12-09 – 2023-12-10 (×4): 25 mg via ORAL
  Filled 2023-12-09 (×4): qty 1

## 2023-12-09 MED ORDER — LEVOTHYROXINE SODIUM 25 MCG PO TABS
25.0000 ug | ORAL_TABLET | Freq: Every day | ORAL | Status: DC
  Administered 2023-12-10 – 2023-12-12 (×3): 25 ug via ORAL
  Filled 2023-12-09 (×3): qty 1

## 2023-12-09 MED ORDER — SENNA 8.6 MG PO TABS
1.0000 | ORAL_TABLET | Freq: Every day | ORAL | Status: AC
  Administered 2023-12-09 – 2023-12-10 (×2): 8.6 mg via ORAL
  Filled 2023-12-09 (×2): qty 1

## 2023-12-09 MED ORDER — FUROSEMIDE 10 MG/ML IJ SOLN
40.0000 mg | Freq: Two times a day (BID) | INTRAMUSCULAR | Status: DC
  Administered 2023-12-09 (×2): 40 mg via INTRAVENOUS
  Filled 2023-12-09 (×2): qty 4

## 2023-12-09 MED FILL — Electrolyte-R (PH 7.4) Solution: INTRAVENOUS | Qty: 3000 | Status: CN

## 2023-12-09 MED FILL — Mannitol IV Soln 20%: INTRAVENOUS | Qty: 500 | Status: CN

## 2023-12-09 MED FILL — Heparin Sodium (Porcine) Inj 1000 Unit/ML: INTRAMUSCULAR | Qty: 10 | Status: CN

## 2023-12-09 MED FILL — Sodium Bicarbonate IV Soln 8.4%: INTRAVENOUS | Qty: 50 | Status: CN

## 2023-12-09 MED FILL — Sodium Chloride IV Soln 0.9%: INTRAVENOUS | Qty: 2000 | Status: CN

## 2023-12-09 NOTE — Plan of Care (Signed)
   Problem: Clinical Measurements: Goal: Respiratory complications will improve Outcome: Progressing

## 2023-12-09 NOTE — Progress Notes (Addendum)
 NAME:  [name], MRN:  [id number], DOB:  [birth year], LOS: 3 ADMISSION DATE:  12/06/2023, CONSULTATION DATE: 12/06/2023 REFERRING MD: Dr. Melene Sportsman, CHIEF COMPLAINT: Status post CABG and aortic valve replacement  History of Present Illness:  78 year old male with hypertension, hyperlipidemia and prediabetes who initially presented with increasing dyspnea on exertion and occasional chest pain, underwent workup which showed multivessel coronary artery disease and moderate to severe aortic stenosis.  LV systolic function was normal with grade 2 diastolic dysfunction.  Today patient underwent CABG x 2 and bioprosthetic aortic valve replacement.  He remained intubated and was transferred to ICU.  PCCM was consulted for intervention medical management  Pertinent  Medical History   Past Medical History:  Diagnosis Date   Arthritis    LEFT knee   Back pain    Coronary artery disease    Diverticulosis    Heart murmur    History of colon polyps    Hyperlipidemia    on meds   Hypertension    on meds   Hypothyroidism    Moderate aortic stenosis    Pre-diabetes    Thyroid  disease    on meds     Significant Hospital Events: Including procedures, antibiotic start and stop dates in addition to other pertinent events   2/7 MVR  Interim History / Subjective:  No acute events overnight. Has walked. No BM since surgery. Appetite still poor.   Objective   Blood pressure 135/64, pulse 88, temperature 97.8 F (36.6 C), resp. rate (!) 22, height 5\' 8"  (1.727 m), weight 104.8 kg, SpO2 90%.        Intake/Output Summary (Last 24 hours) at 12/09/2023 4098 Last data filed at 12/09/2023 0600 Gross per 24 hour  Intake 720 ml  Output 785 ml  Net -65 ml   Filed Weights   12/07/23 0500 12/08/23 0600 12/09/23 0500  Weight: 106.1 kg 105.7 kg 104.8 kg    Examination: General: elderly man sitting up in the chair in NAD HEENT: [name]/AT, eyes anicteric Neuro: awake, alert, moving all  extremities Chest: Chest tubes with serosanguinous output. Basilar rhales. No tachypnea or conversational dsypnea. Heart: S1S2. RRR Abdomen: distended, NT Skin: warm, dry, no rashes  H/H 7.3/21.3 platelets 74  Na+ 131 BUN 42 Cr 1.8  Chest tube 250cc   CXR personally reviewed> basilar atelectasis, chest tubes in place  Resolved Hospital Problem list     Assessment & Plan:  Coronary artery disease s/p CABG x 2 Severe aortic stenosis status post bioprosthetic aortic valve replacement Chronic HFpEF -aspirin , statin -diuresis -d/c chest tubes today -tele monitoring -advance diet and mobility as tolerated -con't amiodarone  & metoprolol  -pain control per protocol- morphine , tramadol , oxy  AKI, improving, likely post-op ATN Hypervolemic hyponatremia AGMA due to AKI -diuresis -maintain adequate perfusion -keep foley today  Constipation -miralax  and senna daily x 2 days -mobility -con't daily colace  Acute respiratory insufficiency, postop -diuresis -ambulate, pulmonary hygiene  Hypertension -metoprolol  -hold PTA amlodipine , HCTZ  Hyperlipidemia -statin, zetia   Hypothyroidism -con't PTA synthroid  -monitor this on amiodarone , esp if long-term med  Pre-Diabetes, hyperglycemia, A1c is 5.7 -SSI PRN- minimal requirements -glargine 25 units daily -may need mealtime insulin  when he eats more, although insulin  sensitivity may improve as he improves -goal BG 140-180  Expected perioperative blood loss anemia Thrombocytopenia, consumptive -diuresis -no need for transfusion currently   Best Practice (right click and "Reselect all SmartList Selections" daily)  Diet/type: regular DVT prophylaxis: SCD GI prophylaxis: PPI Lines: n/a Foley:  Yes,  and it is still needed Code Status:  full code Last date of multidisciplinary goals of care discussion [Per primary team]  Labs   CBC: Recent Labs  Lab 12/06/23 1915 12/06/23 2102 12/07/23 0445 12/07/23 1618  12/08/23 0329 12/09/23 0522  WBC 7.4  --  7.8 10.5 12.8* 8.3  HGB 8.4* 7.3* 8.9* 8.7* 8.3* 7.3*  HCT 22.6* 19.7* 24.6* 24.8* 23.7* 21.3*  MCV 90.8  --  89.5 89.9 91.9 92.6  PLT 92*  --  90* 95* 100* 74*    Basic Metabolic Panel: Recent Labs  Lab 12/06/23 1915 12/07/23 0445 12/07/23 1618 12/08/23 0329 12/09/23 0522  NA 139 136 136 134* 131*  K 4.2 4.4 4.6 4.7 3.9  CL 108 107 103 100 95*  CO2 22 19* 21* 20* 20*  GLUCOSE 147* 146* 219* 158* 161*  BUN 16 20 25* 31* 42*  CREATININE 0.85 1.06 1.63* 2.15* 1.80*  CALCIUM  7.8* 7.4* 7.8* 7.9* 8.3*  MG 3.2* 2.9* 2.8*  --   --    GFR: Estimated Creatinine Clearance: 41.6 mL/min (A) (by C-G formula based on SCr of 1.8 mg/dL (H)). Recent Labs  Lab 12/07/23 0445 12/07/23 1618 12/08/23 0329 12/09/23 0522  WBC 7.8 10.5 12.8* 8.3    Liver Function Tests: Recent Labs  Lab 12/04/23 1314  AST 47*  ALT 48*  ALKPHOS 62  BILITOT 1.1  PROT 7.4  ALBUMIN  4.4    Critical care time:    Joesph Mussel, DO 12/09/23 8:47 AM [facility]  For contact information, see Amion. If no response to pager, please call PCCM consult pager. After hours, 7PM- 7AM, please call Elink.

## 2023-12-09 NOTE — Progress Notes (Signed)
 [address]       [place] [address]             [phone]      3 Days Post-Op  Procedure(s) (LRB): AORTIC VALVE REPLACEMENT (AVR) USING INSPIRIS AORTIC VALVE SIZE 23 MM (N/A) CORONARY ARTERY BYPASS GRAFTING (CABG) TIMES TWO USING LEFT INTERNAL MAMMARY ARTERY AND ENDOSCOPICALLY HARVESTED RIGHT GREATER SAPHENOUS VEIN (N/A) TRANSESOPHAGEAL ECHOCARDIOGRAM (TEE) (N/A)   Total Length of Stay:  LOS: 3 days    SUBJECTIVE: Ambulated this am without issue No appetite  Vitals:   12/09/23 0500 12/09/23 0600  BP: 139/69 135/64  Pulse: 91 88  Resp: 20 (!) 22  Temp:    SpO2: 94% 90%    Intake/Output      02/09 0701 02/10 0700 02/10 0701 02/11 0700   P.O. 720    I.V. (mL/kg)     IV Piggyback     Total Intake(mL/kg) 720 (6.9)    Urine (mL/kg/hr) 535 (0.2)    Chest Tube 250    Total Output 785    Net -65              CBC    Component Value Date/Time   WBC 8.3 12/09/2023 0522   RBC 2.30 (L) 12/09/2023 0522   HGB 7.3 (L) 12/09/2023 0522   HGB 14.5 07/10/2023 1145   HCT 21.3 (L) 12/09/2023 0522   HCT 42.3 07/10/2023 1145   PLT 74 (L) 12/09/2023 0522   PLT 136 (L) 07/10/2023 1145   MCV 92.6 12/09/2023 0522   MCV 96 07/10/2023 1145   MCH 31.7 12/09/2023 0522   MCHC 34.3 12/09/2023 0522   RDW 14.4 12/09/2023 0522   RDW 12.6 07/10/2023 1145   LYMPHSABS 2.3 03/15/2023 0753   MONOABS 0.6 03/15/2023 0753   EOSABS 0.4 03/15/2023 0753   BASOSABS 0.1 03/15/2023 0753   CMP     Component Value Date/Time   NA 131 (L) 12/09/2023 0522   NA 139 07/10/2023 1145   K 3.9 12/09/2023 0522   CL 95 (L) 12/09/2023 0522   CO2 20 (L) 12/09/2023 0522   GLUCOSE 161 (H) 12/09/2023 0522   BUN 42 (H) 12/09/2023 0522   BUN 19 07/10/2023 1145   CREATININE 1.80 (H) 12/09/2023 0522   CALCIUM  8.3 (L) 12/09/2023 0522   PROT 7.4 12/04/2023 1314   PROT 7.3 07/10/2023 1145   ALBUMIN  4.4 12/04/2023 1314   ALBUMIN  5.1 (H) 07/10/2023 1145   AST 47 (H) 12/04/2023  1314   ALT 48 (H) 12/04/2023 1314   ALKPHOS 62 12/04/2023 1314   BILITOT 1.1 12/04/2023 1314   BILITOT 0.8 07/10/2023 1145   GFRNONAA 39 (L) 12/09/2023 0522   GFRAA >60 02/13/2017 0739   ABG    Component Value Date/Time   PHART 7.391 12/06/2023 1849   PCO2ART 36.6 12/06/2023 1849   PO2ART 78 (L) 12/06/2023 1849   HCO3 22.3 12/06/2023 1849   TCO2 23 12/06/2023 1849   ACIDBASEDEF 2.0 12/06/2023 1849   O2SAT 96 12/06/2023 1849   CBG (last 3)  Recent Labs    12/08/23 0740 12/08/23 1133 12/08/23 1607  GLUCAP 167* 177* 158*  EXAM Lungs: clear Card: RR  Ext; warm slight edema Neuro: intact   ASSESSMENT: POD #3 SP AVR/CABG Hemodynamics ok. Will increase BB. On amio Will DC pacing wires and chest tubes today Diureisis. Cr improving.  Anemia: expected post op. Will follow for now with HCT 21 and see if improves  with diuresis Leave in unit today   Melene Sportsman, MD 12/09/2023

## 2023-12-09 NOTE — TOC Initial Note (Signed)
 Transition of Care [facility]) - Initial/Assessment Note    Patient Details  Name: [name] MRN: [id number] Date of Birth: [birth year]  [facility] CM/SW Contact:    Benjiman Bras, RN Phone Number: [phone] 12/09/2023, 4:03 PM  Clinical Narrative:                 TOC CM spoke to pt at bedside. States he was independent pta. States no DME at home. Will arrange [facility] with [employer] if needed at dc. Pt was preoperatively set up by surgeon's office if [facility] was needed.  Wife at home to assist with care.  Will continue to follow for dc needs.   Expected Discharge Plan: Home w Home Health Services Barriers to Discharge: Continued Medical Work up   Patient Goals and CMS Choice Patient states their goals for this hospitalization and ongoing recovery are:: wants to remain independent          Expected Discharge Plan and Services   Discharge Planning Services: CM Consult   Living arrangements for the past 2 months: Single Family Home                                      Prior Living Arrangements/Services Living arrangements for the past 2 months: Single Family Home Lives with:: Spouse Patient language and need for interpreter reviewed:: Yes Do you feel safe going back to the place where you live?: Yes      Need for Family Participation in Patient Care: No (Comment) Care giver support system in place?: Yes (comment)   Criminal Activity/Legal Involvement Pertinent to Current Situation/Hospitalization: No - Comment as needed  Activities of Daily Living   ADL Screening (condition at time of admission) Independently performs ADLs?: Yes (appropriate for developmental age) Is the patient deaf or have difficulty hearing?: No Does the patient have difficulty seeing, even when wearing glasses/contacts?: No Does the patient have difficulty concentrating, remembering, or making decisions?: No  Permission Sought/Granted Permission sought to share  information with : Case Manager, PCP, Family Supports Permission granted to share information with : Yes, Verbal Permission Granted  Share Information with NAME: Tallin Ehly     Permission granted to share info w Relationship: wife  Permission granted to share info w Contact Information: [phone]  Emotional Assessment Appearance:: Appears stated age Attitude/Demeanor/Rapport: Engaged Affect (typically observed): Accepting Orientation: : Oriented to Self, Oriented to Place, Oriented to  Time, Oriented to Situation   Psych Involvement: No (comment)  Admission diagnosis:  S/P AVR (aortic valve replacement) [Z95.2] Patient Active Problem List   Diagnosis Date Noted   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   Aortic stenosis 07/05/2023   CAD (coronary artery disease) 07/05/2023   Chest pressure 05/27/2023   Urinary hesitancy 03/22/2023   Hyperkalemia 03/22/2023   Hypothyroid 03/08/2021   History of colon polyps 03/08/2021   Alcohol intake above recommended sensible limits 05/13/2019   Hyperlipidemia 02/25/2019   Colon cancer screening 02/12/2017   Routine general medical examination at a health care facility 01/20/2016   Heart murmur, systolic 01/13/2014   Elevated PSA 01/13/2014   Encounter for Medicare annual wellness exam 09/24/2011   Prostate cancer screening 09/24/2011   Osteoarthritis, knee 02/14/2011   Tinnitus 12/09/2009   BACK PAIN, LUMBAR, WITH RADICULOPATHY 10/10/2009   Essential hypertension, benign 08/16/2009   Thrombocytopenia (HCC) 02/15/2009   Prediabetes 01/07/2008   PCP:  Malissa Se,  Manley Seeds, MD Pharmacy:   [facility] [address] [place] - [address] [place] [address] Phone: [phone] Fax: [phone]     Social Drivers of Health (SDOH) Social History: SDOH Screenings   Food Insecurity: Patient Unable To Answer (12/06/2023)  Housing: Patient Unable To Answer (12/06/2023)  Transportation Needs: Patient  Unable To Answer (12/06/2023)  Utilities: Patient Unable To Answer (12/06/2023)  Alcohol Screen: Medium Risk (07/04/2023)  Depression (PHQ2-9): Low Risk  (07/05/2023)  Financial Resource Strain: Low Risk  (07/04/2023)  Physical Activity: Sufficiently Active (07/04/2023)  Social Connections: Unknown (12/06/2023)  Tobacco Use: Low Risk  (12/06/2023)   SDOH Interventions:     Readmission Risk Interventions     No data to display

## 2023-12-09 NOTE — Progress Notes (Signed)
 Patient lying in bed, heart rate changed to AFIB RVR (80-110s), patient denies chest pain.  TCT on call notified, verbal order to start amio

## 2023-12-09 NOTE — Progress Notes (Signed)
      [address]       [place] [address]             [phone]      POD # 3 AVR CABG x 2   BP 118/60   Pulse 83   Temp 98.5 F (36.9 C) (Oral)   Resp (!) 24   Ht 5\' 8"  (1.727 m)   Wt 104.8 kg   SpO2 98%   BMI 35.13 kg/m  2L [place] 97% sat   Intake/Output Summary (Last 24 hours) at 12/09/2023 1646 Last data filed at 12/09/2023 1500 Gross per 24 hour  Intake 603 ml  Output 730 ml  Net -127 ml   Creatinine 1.8 down form 2.15 K 3.9  Annamay Laymon C. Luna Salinas, MD [facility] [phone]

## 2023-12-10 ENCOUNTER — Other Ambulatory Visit: Payer: Self-pay | Admitting: Home Health

## 2023-12-10 DIAGNOSIS — Z952 Presence of prosthetic heart valve: Secondary | ICD-10-CM

## 2023-12-10 DIAGNOSIS — K5903 Drug induced constipation: Secondary | ICD-10-CM

## 2023-12-10 DIAGNOSIS — R739 Hyperglycemia, unspecified: Secondary | ICD-10-CM

## 2023-12-10 DIAGNOSIS — I5022 Chronic systolic (congestive) heart failure: Secondary | ICD-10-CM

## 2023-12-10 LAB — BASIC METABOLIC PANEL
Anion gap: 11 (ref 5–15)
BUN: 44 mg/dL — ABNORMAL HIGH (ref 8–23)
CO2: 21 mmol/L — ABNORMAL LOW (ref 22–32)
Calcium: 7.8 mg/dL — ABNORMAL LOW (ref 8.9–10.3)
Chloride: 100 mmol/L (ref 98–111)
Creatinine, Ser: 1.67 mg/dL — ABNORMAL HIGH (ref 0.61–1.24)
GFR, Estimated: 42 mL/min — ABNORMAL LOW (ref >60)
Glucose, Bld: 144 mg/dL — ABNORMAL HIGH (ref 70–99)
Potassium: 3.8 mmol/L (ref 3.5–5.1)
Sodium: 132 mmol/L — ABNORMAL LOW (ref 135–145)

## 2023-12-10 LAB — CBC
HCT: 20.8 % — ABNORMAL LOW (ref 39.0–52.0)
Hemoglobin: 7.2 g/dL — ABNORMAL LOW (ref 13.0–17.0)
MCH: 31.9 pg (ref 26.0–34.0)
MCHC: 34.6 g/dL (ref 30.0–36.0)
MCV: 92 fL (ref 80.0–100.0)
Platelets: 96 10*3/uL — ABNORMAL LOW (ref 150–400)
RBC: 2.26 MIL/uL — ABNORMAL LOW (ref 4.22–5.81)
RDW: 14 % (ref 11.5–15.5)
WBC: 6.9 10*3/uL (ref 4.0–10.5)
nRBC: 0.4 % — ABNORMAL HIGH (ref 0.0–0.2)

## 2023-12-10 LAB — GLUCOSE, CAPILLARY
Glucose-Capillary: 161 mg/dL — ABNORMAL HIGH (ref 70–99)
Glucose-Capillary: 146 mg/dL — ABNORMAL HIGH (ref 70–99)
Glucose-Capillary: 151 mg/dL — ABNORMAL HIGH (ref 70–99)
Glucose-Capillary: 112 mg/dL — ABNORMAL HIGH (ref 70–99)
Glucose-Capillary: 115 mg/dL — ABNORMAL HIGH (ref 70–99)
Glucose-Capillary: 111 mg/dL — ABNORMAL HIGH (ref 70–99)

## 2023-12-10 LAB — PREPARE RBC (CROSSMATCH)

## 2023-12-10 MED ORDER — POTASSIUM CHLORIDE CRYS ER 20 MEQ PO TBCR
40.0000 meq | EXTENDED_RELEASE_TABLET | Freq: Once | ORAL | Status: AC
  Administered 2023-12-10: 40 meq via ORAL
  Filled 2023-12-10: qty 2

## 2023-12-10 MED ORDER — SORBITOL 70 % SOLN: 30.0000 mL | Freq: Once | Status: DC

## 2023-12-10 MED ORDER — METOLAZONE 5 MG PO TABS
5.0000 mg | ORAL_TABLET | Freq: Once | ORAL | Status: AC
  Administered 2023-12-10: 5 mg via ORAL
  Filled 2023-12-10: qty 1

## 2023-12-10 MED ORDER — HEPARIN SODIUM (PORCINE) 5000 UNIT/ML IJ SOLN
5000.0000 [IU] | Freq: Three times a day (TID) | INTRAMUSCULAR | Status: DC
  Administered 2023-12-10 – 2023-12-11 (×4): 5000 [IU] via SUBCUTANEOUS
  Filled 2023-12-10 (×4): qty 1

## 2023-12-10 MED ORDER — FUROSEMIDE 10 MG/ML IJ SOLN
60.0000 mg | Freq: Two times a day (BID) | INTRAMUSCULAR | Status: DC
  Administered 2023-12-10 (×2): 60 mg via INTRAVENOUS
  Filled 2023-12-10 (×2): qty 6

## 2023-12-10 MED ORDER — SODIUM CHLORIDE 0.9% IV SOLUTION: Freq: Once | INTRAVENOUS | Status: AC

## 2023-12-10 MED ORDER — AMIODARONE HCL 200 MG PO TABS
400.0000 mg | ORAL_TABLET | Freq: Three times a day (TID) | ORAL | Status: DC
  Administered 2023-12-10 – 2023-12-12 (×5): 400 mg via ORAL
  Filled 2023-12-10 (×5): qty 2

## 2023-12-10 MED ORDER — SORBITOL 70 % SOLN
30.0000 mL | Freq: Once | Status: AC
  Administered 2023-12-10: 30 mL via ORAL
  Filled 2023-12-10: qty 30

## 2023-12-10 MED FILL — Sodium Chloride IV Soln 0.9%: INTRAVENOUS | Qty: 2000 | Status: AC

## 2023-12-10 MED FILL — Sodium Bicarbonate IV Soln 8.4%: INTRAVENOUS | Qty: 50 | Status: AC

## 2023-12-10 MED FILL — Mannitol IV Soln 20%: INTRAVENOUS | Qty: 500 | Status: AC

## 2023-12-10 MED FILL — Heparin Sodium (Porcine) Inj 1000 Unit/ML: INTRAMUSCULAR | Qty: 10 | Status: AC

## 2023-12-10 MED FILL — Electrolyte-R (PH 7.4) Solution: INTRAVENOUS | Qty: 1000 | Status: AC

## 2023-12-10 NOTE — Plan of Care (Signed)

## 2023-12-10 NOTE — Plan of Care (Signed)
  Problem: Education: Goal: Knowledge of General Education information will improve Description: Including pain rating scale, medication(s)/side effects and non-pharmacologic comfort measures Outcome: Progressing   Problem: Health Behavior/Discharge Planning: Goal: Ability to manage health-related needs will improve Outcome: Progressing   Problem: Clinical Measurements: Goal: Ability to maintain clinical measurements within normal limits will improve Outcome: Progressing Goal: Will remain free from infection Outcome: Progressing Goal: Diagnostic test results will improve Outcome: Progressing Goal: Respiratory complications will improve Outcome: Progressing Goal: Cardiovascular complication will be avoided Outcome: Progressing   Problem: Activity: Goal: Risk for activity intolerance will decrease Outcome: Progressing   Problem: Nutrition: Goal: Adequate nutrition will be maintained Outcome: Progressing   Problem: Coping: Goal: Level of anxiety will decrease Outcome: Progressing   Problem: Elimination: Goal: Will not experience complications related to bowel motility Outcome: Progressing Goal: Will not experience complications related to urinary retention Outcome: Progressing   Problem: Pain Managment: Goal: General experience of comfort will improve and/or be controlled Outcome: Progressing   Problem: Safety: Goal: Ability to remain free from injury will improve Outcome: Progressing   Problem: Skin Integrity: Goal: Risk for impaired skin integrity will decrease Outcome: Progressing   Problem: Education: Goal: Will demonstrate proper wound care and an understanding of methods to prevent future damage Outcome: Progressing Goal: Knowledge of disease or condition will improve Outcome: Progressing Goal: Knowledge of the prescribed therapeutic regimen will improve Outcome: Progressing   Problem: Activity: Goal: Risk for activity intolerance will decrease Outcome:  Progressing   Problem: Cardiac: Goal: Will achieve and/or maintain hemodynamic stability Outcome: Progressing   Problem: Clinical Measurements: Goal: Postoperative complications will be avoided or minimized Outcome: Progressing   Problem: Respiratory: Goal: Respiratory status will improve Outcome: Progressing   Problem: Skin Integrity: Goal: Wound healing without signs and symptoms of infection Outcome: Progressing Goal: Risk for impaired skin integrity will decrease Outcome: Progressing   Problem: Urinary Elimination: Goal: Ability to achieve and maintain adequate renal perfusion and functioning will improve Outcome: Progressing

## 2023-12-10 NOTE — Progress Notes (Signed)
NAME:  [name], MRN:  [id number], DOB:  [birth year], LOS: 4 ADMISSION DATE:  12/06/2023, CONSULTATION DATE: 12/06/2023 REFERRING MD: Dr. Eugenio Hoes, CHIEF COMPLAINT: Status post CABG and aortic valve replacement  History of Present Illness:  78 year old male with hypertension, hyperlipidemia and prediabetes who initially presented with increasing dyspnea on exertion and occasional chest pain, underwent workup which showed multivessel coronary artery disease and moderate to severe aortic stenosis.  LV systolic function was normal with grade 2 diastolic dysfunction.  Today patient underwent CABG x 2 and bioprosthetic aortic valve replacement.  He remained intubated and was transferred to ICU.  PCCM was consulted for intervention medical management  Pertinent  Medical History   Past Medical History:  Diagnosis Date   Arthritis    LEFT knee   Back pain    Coronary artery disease    Diverticulosis    Heart murmur    History of colon polyps    Hyperlipidemia    on meds   Hypertension    on meds   Hypothyroidism    Moderate aortic stenosis    Pre-diabetes    Thyroid disease    on meds     Significant Hospital Events: Including procedures, antibiotic start and stop dates in addition to other pertinent events   2/7 MVR  Interim History / Subjective:  No SOB, no BM since admission.  Afebrile overnight.   Objective   Blood pressure (!) 145/74, pulse (!) 104, temperature 98.1 F (36.7 C), resp. rate (!) 21, height 5\' 8"  (1.727 m), weight 105.1 kg, SpO2 93%.        Intake/Output Summary (Last 24 hours) at 12/10/2023 6962 Last data filed at 12/10/2023 0600 Gross per 24 hour  Intake 646.72 ml  Output 1345 ml  Net -698.28 ml   Filed Weights   12/08/23 0600 12/09/23 0500 12/10/23 0500  Weight: 105.7 kg 104.8 kg 105.1 kg    Examination: General: elderly man sitting up in the chair in NAD HEENT: [place]/AT, eyes anicteric  Neuro: awake, alert, answering questions  appropriately Resp:  breathing comfortably on [place], reduced basilar breath sounds, CTA anteriorly Heart: S1S2, RRR Abdomen: soft, NT Skin: warm, dry, no rashes  H/H 7.2/20.8 platelets 96  Na+ 132 BUN 44 Cr 1.67   Resolved Hospital Problem list     Assessment & Plan:  Coronary artery disease s/p CABG x 2 Severe aortic stenosis status post bioprosthetic aortic valve replacement Chronic HFpEF -Aspirin, statin - Increase diuresis-Lasix and metolazone - Telemetry monitoring - Continue metoprolol and amiodarone - Pain control per protocol - Continue advancing mobility  AKI, improving, likely post-op ATN-improving Hypervolemic hyponatremia AGMA due to AKI -Continue diuresis - Maintain adequate perfusion - Continue to monitor  Constipation -Continue daily miralax and senna daily -Sorbitol today - Continue advancing mobility  Acute respiratory insufficiency, postop -Diuresis, ambulation, pulmonary hygiene - Wean supplemental oxygen as able  Hypertension -Continue metoprolol, may be able to increase dose as blood pressure rises - Continue holding PTA amlodipine, hydrochlorothiazide  Hyperlipidemia -Continue statin and Zetia  Hypothyroidism -Continue Synthroid -monitor this on amiodarone, esp if long-term med  Pre-Diabetes, hyperglycemia, A1c is 5.7 -SSI as needed - Continue glargine 25 units daily - Goal blood glucose 140-180  Expected perioperative blood loss anemia Thrombocytopenia, consumptive-improving -1 unit PRBCs today - Continue diuresis   Best Practice (right click and "Reselect all SmartList Selections" daily)  Diet/type: regular DVT prophylaxis:  heparin GI prophylaxis: PPI Lines: n/a Foley:  Yes, and it is still needed  Code Status:  full code Last date of multidisciplinary goals of care discussion [Per primary team]  Labs   CBC: Recent Labs  Lab 12/07/23 0445 12/07/23 1618 12/08/23 0329 12/09/23 0522 12/10/23 0445  WBC 7.8 10.5  12.8* 8.3 6.9  HGB 8.9* 8.7* 8.3* 7.3* 7.2*  HCT 24.6* 24.8* 23.7* 21.3* 20.8*  MCV 89.5 89.9 91.9 92.6 92.0  PLT 90* 95* 100* 74* 96*    Basic Metabolic Panel: Recent Labs  Lab 12/06/23 1915 12/07/23 0445 12/07/23 1618 12/08/23 0329 12/09/23 0522 12/10/23 0445  NA 139 136 136 134* 131* 132*  K 4.2 4.4 4.6 4.7 3.9 3.8  CL 108 107 103 100 95* 100  CO2 22 19* 21* 20* 20* 21*  GLUCOSE 147* 146* 219* 158* 161* 144*  BUN 16 20 25* 31* 42* 44*  CREATININE 0.85 1.06 1.63* 2.15* 1.80* 1.67*  CALCIUM 7.8* 7.4* 7.8* 7.9* 8.3* 7.8*  MG 3.2* 2.9* 2.8*  --   --   --    GFR: Estimated Creatinine Clearance: 44.9 mL/min (A) (by C-G formula based on SCr of 1.67 mg/dL (H)). Recent Labs  Lab 12/07/23 1618 12/08/23 0329 12/09/23 0522 12/10/23 0445  WBC 10.5 12.8* 8.3 6.9    Liver Function Tests: Recent Labs  Lab 12/04/23 1314  AST 47*  ALT 48*  ALKPHOS 62  BILITOT 1.1  PROT 7.4  ALBUMIN 4.4    Critical care time:    Steffanie Dunn, DO 12/10/23 8:44 AM [facility]  For contact information, see Amion. If no response to pager, please call PCCM consult pager. After hours, 7PM- 7AM, please call Elink.

## 2023-12-10 NOTE — Progress Notes (Signed)
[address]       [place] [address]             [phone]      4 Days Post-Op Procedure(s) (LRB): AORTIC VALVE REPLACEMENT (AVR) USING INSPIRIS AORTIC VALVE SIZE 23 MM (N/A) CORONARY ARTERY BYPASS GRAFTING (CABG) TIMES TWO USING LEFT INTERNAL MAMMARY ARTERY AND ENDOSCOPICALLY HARVESTED RIGHT GREATER SAPHENOUS VEIN (N/A) TRANSESOPHAGEAL ECHOCARDIOGRAM (TEE) (N/A) Subjective: Patient sitting up in chair eating Cheerios for breakfast. No new complaints this AM.   Objective: Vital signs in last 24 hours: Temp:  [97.9 F (36.6 C)-98.5 F (36.9 C)] 97.9 F (36.6 C) (02/11 0400) Pulse Rate:  [76-104] 104 (02/11 0600) Cardiac Rhythm: Atrial fibrillation (02/10 2001) Resp:  [17-27] 21 (02/11 0600) BP: (97-145)/(53-74) 145/74 (02/11 0600) SpO2:  [84 %-98 %] 93 % (02/11 0600) Weight:  [105.1 kg] 105.1 kg (02/11 0500)  Hemodynamic parameters for last 24 hours:    Intake/Output from previous day: 02/10 0701 - 02/11 0700 In: 646.7 [P.O.:420; I.V.:226.7] Out: 1345 [Urine:1315; Chest Tube:30] Intake/Output this shift: No intake/output data recorded.  General appearance: alert, cooperative, and no distress Neurologic: intact Heart: regular rate and rhythm, S1, S2 normal, no murmur, click, rub or gallop Lungs: slightly diminished bibasilar breath sounds with slight rales throughout  Abdomen: soft, non-tender; bowel sounds normal; no masses,  no organomegaly Extremities: edema 1+ Wound: Clean and dry dressing in place  Lab Results: Recent Labs    12/09/23 0522 12/10/23 0445  WBC 8.3 6.9  HGB 7.3* 7.2*  HCT 21.3* 20.8*  PLT 74* 96*   BMET:  Recent Labs    12/09/23 0522 12/10/23 0445  NA 131* 132*  K 3.9 3.8  CL 95* 100  CO2 20* 21*  GLUCOSE 161* 144*  BUN 42* 44*  CREATININE 1.80* 1.67*  CALCIUM 8.3* 7.8*    PT/INR: No results for input(s): "LABPROT", "INR" in the last 72 hours. ABG    Component Value Date/Time   PHART 7.391 12/06/2023  1849   HCO3 22.3 12/06/2023 1849   TCO2 23 12/06/2023 1849   ACIDBASEDEF 2.0 12/06/2023 1849   O2SAT 96 12/06/2023 1849   CBG (last 3)  Recent Labs    12/09/23 1114 12/09/23 1544 12/10/23 0626  GLUCAP 139* 150* 161*    Assessment/Plan: S/P Procedure(s) (LRB): AORTIC VALVE REPLACEMENT (AVR) USING INSPIRIS AORTIC VALVE SIZE 23 MM (N/A) CORONARY ARTERY BYPASS GRAFTING (CABG) TIMES TWO USING LEFT INTERNAL MAMMARY ARTERY AND ENDOSCOPICALLY HARVESTED RIGHT GREATER SAPHENOUS VEIN (N/A) TRANSESOPHAGEAL ECHOCARDIOGRAM (TEE) (N/A)  Neuro: Pain controlled  CV: BP overall controlled. Postop afib with RVR last night, HR into the 110s. Was started on IV Amiodarone. Converted to NSR, HR 80s this AM. Will transition to PO Amiodarone. Continue Lopressor 25mg  BID.   Pulm: Saturating well on 2L [place]. CXR yesterday with small left apical pneumothorax, bibasilar atelectasis and small bilateral pleural effusions. Will get new CXR tomorrow AM. Encourage IS and ambulation. Wean O2 as able.   GI: Tolerating a diet, passing flatus. -BM  Endo: CBGs overall controlled 139/150/161 on semglee 25U daily and SSI. Hypothyroid, continue home synthroid  Renal: Cr 1.67, trending down. UO 1315cc/24hrs. +8lbs from preop. IV Lasix and Metolazone has been ordered. D/C foley  ID: Leukocytosis resolved, afebrile.  Expected postop ABLA: H/H 7.2/20.8, will transfuse 1U PRBCs. Reactive thrombocytopenia improving, plt 96,000.  DVT Prophylaxis: Not on Lovenox likely due to thrombocytopenia. Continue ambulation.  Dispo: Continue current care   LOS: 4  days    Jenny Reichmann, PA-C 12/10/2023

## 2023-12-11 ENCOUNTER — Other Ambulatory Visit (HOSPITAL_COMMUNITY): Payer: Self-pay

## 2023-12-11 ENCOUNTER — Inpatient Hospital Stay (HOSPITAL_COMMUNITY): Payer: Medicare HMO

## 2023-12-11 ENCOUNTER — Other Ambulatory Visit (HOSPITAL_COMMUNITY): Payer: Medicare HMO

## 2023-12-11 ENCOUNTER — Telehealth (HOSPITAL_COMMUNITY): Payer: Self-pay | Admitting: Pharmacy Technician

## 2023-12-11 DIAGNOSIS — J90 Pleural effusion, not elsewhere classified: Secondary | ICD-10-CM

## 2023-12-11 DIAGNOSIS — I4891 Unspecified atrial fibrillation: Secondary | ICD-10-CM

## 2023-12-11 LAB — TYPE AND SCREEN
ABO/RH(D): O POS
Antibody Screen: NEGATIVE
Unit division: 0

## 2023-12-11 LAB — CBC
HCT: 25.4 % — ABNORMAL LOW (ref 39.0–52.0)
Hemoglobin: 9.1 g/dL — ABNORMAL LOW (ref 13.0–17.0)
MCH: 31.9 pg (ref 26.0–34.0)
MCHC: 35.8 g/dL (ref 30.0–36.0)
MCV: 89.1 fL (ref 80.0–100.0)
Platelets: 133 10*3/uL — ABNORMAL LOW (ref 150–400)
RBC: 2.85 MIL/uL — ABNORMAL LOW (ref 4.22–5.81)
RDW: 13.9 % (ref 11.5–15.5)
WBC: 7.3 10*3/uL (ref 4.0–10.5)
nRBC: 0 % (ref 0.0–0.2)

## 2023-12-11 LAB — BPAM RBC
Blood Product Expiration Date: 202502162359
ISSUE DATE / TIME: 202502110818
Unit Type and Rh: 9500

## 2023-12-11 LAB — BASIC METABOLIC PANEL
Anion gap: 13 (ref 5–15)
BUN: 42 mg/dL — ABNORMAL HIGH (ref 8–23)
CO2: 24 mmol/L (ref 22–32)
Calcium: 8.2 mg/dL — ABNORMAL LOW (ref 8.9–10.3)
Chloride: 94 mmol/L — ABNORMAL LOW (ref 98–111)
Creatinine, Ser: 1.67 mg/dL — ABNORMAL HIGH (ref 0.61–1.24)
GFR, Estimated: 42 mL/min — ABNORMAL LOW (ref >60)
Glucose, Bld: 104 mg/dL — ABNORMAL HIGH (ref 70–99)
Potassium: 3.7 mmol/L (ref 3.5–5.1)
Sodium: 131 mmol/L — ABNORMAL LOW (ref 135–145)

## 2023-12-11 LAB — GLUCOSE, CAPILLARY
Glucose-Capillary: 110 mg/dL — ABNORMAL HIGH (ref 70–99)
Glucose-Capillary: 144 mg/dL — ABNORMAL HIGH (ref 70–99)
Glucose-Capillary: 99 mg/dL (ref 70–99)
Glucose-Capillary: 77 mg/dL (ref 70–99)

## 2023-12-11 MED ORDER — METOPROLOL TARTRATE 50 MG PO TABS
50.0000 mg | ORAL_TABLET | Freq: Two times a day (BID) | ORAL | Status: DC
  Administered 2023-12-11 – 2023-12-12 (×3): 50 mg via ORAL
  Filled 2023-12-11 (×3): qty 1

## 2023-12-11 MED ORDER — POTASSIUM CHLORIDE 20 MEQ PO PACK
20.0000 meq | PACK | ORAL | Status: DC
  Administered 2023-12-11: 20 meq
  Filled 2023-12-11: qty 1

## 2023-12-11 MED ORDER — FUROSEMIDE 10 MG/ML IJ SOLN
60.0000 mg | Freq: Every day | INTRAMUSCULAR | Status: DC
  Administered 2023-12-11: 60 mg via INTRAVENOUS
  Filled 2023-12-11: qty 6

## 2023-12-11 MED ORDER — APIXABAN 5 MG PO TABS
5.0000 mg | ORAL_TABLET | Freq: Two times a day (BID) | ORAL | Status: DC
Start: 2023-12-11 — End: 2023-12-12
  Administered 2023-12-11 – 2023-12-12 (×3): 5 mg via ORAL
  Filled 2023-12-11 (×3): qty 1

## 2023-12-11 MED ORDER — POTASSIUM CHLORIDE 20 MEQ PO PACK
20.0000 meq | PACK | ORAL | Status: AC
  Administered 2023-12-11: 20 meq via ORAL
  Filled 2023-12-11: qty 1

## 2023-12-11 NOTE — TOC Progression Note (Signed)
Transition of Care [facility]) - Progression Note    Patient Details  Name: [name] MRN: [id number] Date of Birth: 08/15/1946  Transition of Care [facility]) CM/SW Contact  Graves-Bigelow, Lamar Laundry, RN Phone Number: 12/11/2023, 11:29 AM  Clinical Narrative: Patient was discussed in morning rounds. Patient POD-5 CABG. Patient is being followed by [facility] with TCTS office protocol for [facility] services. Case Manager will continue to follow for additional transition of care needs as the patient progresses.   Expected Discharge Plan: Home w Home Health Services Barriers to Discharge: Continued Medical Work up  Expected Discharge Plan and Services   Discharge Planning Services: CM Consult   Living arrangements for the past 2 months: Single Family Home   Social Determinants of Health (SDOH) Interventions SDOH Screenings   Food Insecurity: Patient Unable To Answer (12/06/2023)  Housing: Patient Unable To Answer (12/06/2023)  Transportation Needs: Patient Unable To Answer (12/06/2023)  Utilities: Patient Unable To Answer (12/06/2023)  Alcohol Screen: Medium Risk (07/04/2023)  Depression (PHQ2-9): Low Risk  (07/05/2023)  Financial Resource Strain: Low Risk  (07/04/2023)  Physical Activity: Sufficiently Active (07/04/2023)  Social Connections: Unknown (12/06/2023)  Tobacco Use: Low Risk  (12/06/2023)    Readmission Risk Interventions     No data to display

## 2023-12-11 NOTE — Plan of Care (Signed)
  Problem: Education: Goal: Knowledge of General Education information will improve Description: Including pain rating scale, medication(s)/side effects and non-pharmacologic comfort measures Outcome: Progressing   Problem: Clinical Measurements: Goal: Respiratory complications will improve Outcome: Progressing   Problem: Nutrition: Goal: Adequate nutrition will be maintained Outcome: Progressing   Problem: Coping: Goal: Level of anxiety will decrease Outcome: Progressing   Problem: Activity: Goal: Risk for activity intolerance will decrease Outcome: Progressing

## 2023-12-11 NOTE — Progress Notes (Signed)
TCTS DAILY ICU PROGRESS NOTE                   [address]            [place] [address]          [phone]   5 Days Post-Op Procedure(s) (LRB): AORTIC VALVE REPLACEMENT (AVR) USING INSPIRIS AORTIC VALVE SIZE 23 MM (N/A) CORONARY ARTERY BYPASS GRAFTING (CABG) TIMES TWO USING LEFT INTERNAL MAMMARY ARTERY AND ENDOSCOPICALLY HARVESTED RIGHT GREATER SAPHENOUS VEIN (N/A) TRANSESOPHAGEAL ECHOCARDIOGRAM (TEE) (N/A)  Total Length of Stay:  LOS: 5 days   Subjective: Feels well  Objective: Vital signs in last 24 hours: Temp:  [97.6 F (36.4 C)-99.3 F (37.4 C)] 98 F (36.7 C) (02/12 0000) Pulse Rate:  [75-108] 102 (02/12 0400) Cardiac Rhythm: Normal sinus rhythm (02/11 2000) Resp:  [0-31] 18 (02/12 0400) BP: (82-148)/(51-89) 130/80 (02/12 0200) SpO2:  [84 %-98 %] 93 % (02/12 0400) Weight:  [99.8 kg] 99.8 kg (02/12 0500)  Filed Weights   12/09/23 0500 12/10/23 0500 12/11/23 0500  Weight: 104.8 kg 105.1 kg 99.8 kg    Weight change: -5.3 kg   Hemodynamic parameters for last 24 hours:    Intake/Output from previous day: 02/11 0701 - 02/12 0700 In: 880 [P.O.:600; I.V.:30; Blood:250] Out: 3620 [Urine:3620]  Intake/Output this shift: No intake/output data recorded.  Current Meds: Scheduled Meds:  sodium chloride   Intravenous Once   acetaminophen  1,000 mg Oral Q6H   Or   acetaminophen (TYLENOL) oral liquid 160 mg/5 mL  1,000 mg Per Tube Q6H   amiodarone  400 mg Oral TID   apixaban  5 mg Oral BID   aspirin EC  81 mg Oral Daily   bisacodyl  10 mg Oral Daily   Or   bisacodyl  10 mg Rectal Daily   docusate sodium  200 mg Oral Daily   ezetimibe  10 mg Oral QPM   furosemide  60 mg Intravenous Daily   heparin injection (subcutaneous)  5,000 Units Subcutaneous Q8H   insulin aspart  0-24 Units Subcutaneous TID WC   insulin glargine-yfgn  25 Units Subcutaneous Daily   levothyroxine  25 mcg Oral Q0600   metoprolol tartrate  50 mg Oral BID   mupirocin  ointment  1 Application Nasal BID   pantoprazole  40 mg Oral Daily   potassium chloride  20 mEq Oral Q4H   rosuvastatin  40 mg Oral QPM   sodium chloride flush  3 mL Intravenous Q12H   sodium chloride flush  3-10 mL Intravenous Q12H   Continuous Infusions: PRN Meds:.dextrose, metoprolol tartrate, morphine injection, ondansetron (ZOFRAN) IV, mouth rinse, oxyCODONE, sodium chloride flush, sodium chloride flush, traMADol  General appearance: alert, cooperative, and no distress Heart: regular rate and rhythm Lungs: dim in bases R>L Abdomen: benign Extremities: no LE edema Wound: incis healing well  Lab Results: CBC: Recent Labs    12/10/23 0445 12/11/23 0211  WBC 6.9 7.3  HGB 7.2* 9.1*  HCT 20.8* 25.4*  PLT 96* 133*   BMET:  Recent Labs    12/10/23 0445 12/11/23 0211  NA 132* 131*  K 3.8 3.7  CL 100 94*  CO2 21* 24  GLUCOSE 144* 104*  BUN 44* 42*  CREATININE 1.67* 1.67*  CALCIUM 7.8* 8.2*    CMET: Lab Results  Component Value Date   WBC 7.3 12/11/2023   HGB 9.1 (L) 12/11/2023   HCT 25.4 (L) 12/11/2023   PLT 133 (L)  12/11/2023   GLUCOSE 104 (H) 12/11/2023   CHOL 162 11/21/2023   TRIG 94 11/21/2023   HDL 48 11/21/2023   LDLDIRECT 130.0 01/06/2013   LDLCALC 97 11/21/2023   ALT 48 (H) 12/04/2023   AST 47 (H) 12/04/2023   NA 131 (L) 12/11/2023   K 3.7 12/11/2023   CL 94 (L) 12/11/2023   CREATININE 1.67 (H) 12/11/2023   BUN 42 (H) 12/11/2023   CO2 24 12/11/2023   TSH 2.67 03/15/2023   PSA 3.01 03/15/2023   INR 1.3 (H) 12/07/2023   HGBA1C 5.7 (H) 12/04/2023      PT/INR: No results for input(s): "LABPROT", "INR" in the last 72 hours. Radiology: No results found.   Assessment/Plan: S/P Procedure(s) (LRB): AORTIC VALVE REPLACEMENT (AVR) USING INSPIRIS AORTIC VALVE SIZE 23 MM (N/A) CORONARY ARTERY BYPASS GRAFTING (CABG) TIMES TWO USING LEFT INTERNAL MAMMARY ARTERY AND ENDOSCOPICALLY HARVESTED RIGHT GREATER SAPHENOUS VEIN (N/A) TRANSESOPHAGEAL  ECHOCARDIOGRAM (TEE) (N/A) POD#5   1 t max 99.3, s BP 80's-140's,atrial fib, on oral amioBBB, low 100's 2 O2 sats ok on RA, CXR some bibasilar effus 3 weight below preop, excellent UOP, may need to decrease diuretics, PCCM assisting w/ management 4 BS well controlled, usual protocols, not a diabetic or on meds preop, HgB A1C 5.7 5 hyponatremia, hypochloremia- monitor closely on diuretics, not acidodic 6 creat stable at 1.67, AKI postop 7 good response to transfusion, H/H 9/25 8 thrombocytopenia trend improved 9 hypothyroid on synthroid 10 eliquis started 11 being transferred to 4 e  Rowe Clack PA-C Pager [phone] 12/11/2023 7:36 AM

## 2023-12-11 NOTE — Telephone Encounter (Signed)
Patient Product/process development scientist completed.    The patient is insured through [employer]. Patient has Medicare and is not eligible for a copay card, but may be able to apply for patient assistance or Medicare RX Payment Plan (Patient Must reach out to their plan, if eligible for payment plan), if available.    Ran test claim for Eliquis 5 mg and the current 30 day co-pay is $152.71 due to a deductible.   This test claim was processed through [facility]- copay amounts may vary at other pharmacies due to pharmacy/plan contracts, or as the patient moves through the different stages of their insurance plan.     Roland Earl, CPHT Pharmacy Technician III Certified Patient Advocate [facility] Patient Advocate Team Direct Number: [phone]  Fax: [phone]

## 2023-12-11 NOTE — Progress Notes (Signed)
NAME:  [name], MRN:  [id number], DOB:  [birth year], LOS: 5 ADMISSION DATE:  12/06/2023, CONSULTATION DATE: 12/06/2023 REFERRING MD: Dr. Eugenio Hoes, CHIEF COMPLAINT: Status post CABG and aortic valve replacement  History of Present Illness:  78 year old male with hypertension, hyperlipidemia and prediabetes who initially presented with increasing dyspnea on exertion and occasional chest pain, underwent workup which showed multivessel coronary artery disease and moderate to severe aortic stenosis.  LV systolic function was normal with grade 2 diastolic dysfunction.  Today patient underwent CABG x 2 and bioprosthetic aortic valve replacement.  He remained intubated and was transferred to ICU.  PCCM was consulted for intervention medical management  Pertinent  Medical History   Past Medical History:  Diagnosis Date   Arthritis    LEFT knee   Back pain    Coronary artery disease    Diverticulosis    Heart murmur    History of colon polyps    Hyperlipidemia    on meds   Hypertension    on meds   Hypothyroidism    Moderate aortic stenosis    Pre-diabetes    Thyroid disease    on meds     Significant Hospital Events: Including procedures, antibiotic start and stop dates in addition to other pertinent events   2/7: aortic valve replacement, CABG x2, extubated 2/8: d/c swan, a-line, started diuresis  2/10: d/c pacing wires and CT 2/11: increased diuresis with lasix/metolazone. Got 1U PRBC for post-operative ABLA 2/12: BM's yesterday and overnight, transition out of ICU   Interim History / Subjective:  NAEON. Had bowel movements yesterday into today. On room air. Almost net even. Put out 3.6L yesterday. Hemoglobin responded to PRBC unit now at 9.1. he has no complaints. Order to transfer out of ICU.   Objective   Blood pressure 130/80, pulse (!) 102, temperature 98 F (36.7 C), temperature source Oral, resp. rate 18, height 5\' 8"  (1.727 m), weight 99.8 kg, SpO2 93%.         Intake/Output Summary (Last 24 hours) at 12/11/2023 0751 Last data filed at 12/11/2023 0500 Gross per 24 hour  Intake 880 ml  Output 3620 ml  Net -2740 ml   Filed Weights   12/09/23 0500 12/10/23 0500 12/11/23 0500  Weight: 104.8 kg 105.1 kg 99.8 kg    Examination: General: older male, sitting OOB in chair, no acute distress  HEENT: anicteric sclera, mmm  Neuro: awake, alert, oriented x4. Non focal exam Resp: on room air, no wheezing, rhonchi, rales. Respirations even and unlabored  Heart: s1s2, systolic murmur, no rub, gallop  Abdomen: rounded, soft, ntnd Skin: warm, dry, intact   Resolved Hospital Problem list   Constipation  Respiratory insufficiency  Assessment & Plan:  Coronary artery disease s/p CABG x 2 Severe aortic stenosis status post bioprosthetic aortic valve replacement Chronic HFpEF - con't ASA, zetia, rosuvastatin - start eliquis  - lasix 60mg  today to optimize I/O - Telemetry monitoring - Continue metoprolol and amiodarone - Pain control per protocol - tramadol, oxy, morphine, tylenol  - Continue advancing mobility  AKI, improving, likely post-op ATN-improving Hypervolemic hyponatremia AGMA due to AKI - Lasix 60mg  IV today - sCr flat  - trend bmp, mag, phos - replete elytes - strict I&O - Avoid nephrotoxic agents, renally dose medications - ensure adequate renal perfusion   Hypertension - con't metoprolol 50mg  BID  - con't to hold amlodipine and hydrochlorothiazide - per TCTS   Hyperlipidemia - con't statin and Zetia   Hypothyroidism -con't  Synthroid -monitor this on amiodarone, esp if long-term med  Pre-Diabetes, hyperglycemia, A1c is 5.7 - cbg q4h  - SSI TID with meals  - semglee 25U daily   Expected perioperative blood loss anemia Thrombocytopenia, consumptive-improving - trend cbc  - got 1U PRBC 2/11 with good response    Best Practice (right click and "Reselect all SmartList Selections" daily)  Diet/type: regular DVT  prophylaxis: eliquis  GI prophylaxis: n/a Lines: n/a Foley:  n/a Code Status:  full code Last date of multidisciplinary goals of care discussion [Per primary team]  Labs   CBC: Recent Labs  Lab 12/07/23 1618 12/08/23 0329 12/09/23 0522 12/10/23 0445 12/11/23 0211  WBC 10.5 12.8* 8.3 6.9 7.3  HGB 8.7* 8.3* 7.3* 7.2* 9.1*  HCT 24.8* 23.7* 21.3* 20.8* 25.4*  MCV 89.9 91.9 92.6 92.0 89.1  PLT 95* 100* 74* 96* 133*    Basic Metabolic Panel: Recent Labs  Lab 12/06/23 1915 12/07/23 0445 12/07/23 1618 12/08/23 0329 12/09/23 0522 12/10/23 0445 12/11/23 0211  NA 139 136 136 134* 131* 132* 131*  K 4.2 4.4 4.6 4.7 3.9 3.8 3.7  CL 108 107 103 100 95* 100 94*  CO2 22 19* 21* 20* 20* 21* 24  GLUCOSE 147* 146* 219* 158* 161* 144* 104*  BUN 16 20 25* 31* 42* 44* 42*  CREATININE 0.85 1.06 1.63* 2.15* 1.80* 1.67* 1.67*  CALCIUM 7.8* 7.4* 7.8* 7.9* 8.3* 7.8* 8.2*  MG 3.2* 2.9* 2.8*  --   --   --   --    GFR: Estimated Creatinine Clearance: 43.8 mL/min (A) (by C-G formula based on SCr of 1.67 mg/dL (H)). Recent Labs  Lab 12/08/23 0329 12/09/23 0522 12/10/23 0445 12/11/23 0211  WBC 12.8* 8.3 6.9 7.3    Liver Function Tests: Recent Labs  Lab 12/04/23 1314  AST 47*  ALT 48*  ALKPHOS 62  BILITOT 1.1  PROT 7.4  ALBUMIN 4.4    Critical care time: n/a   Lenard Galloway [facility] 12/11/23 10:44 AM  Please see [email or web address] for pager details.  From 7A-7P if no response, please call [phone] After hours, please call ELink [phone]

## 2023-12-11 NOTE — Discharge Instructions (Signed)

## 2023-12-11 NOTE — Plan of Care (Signed)

## 2023-12-11 NOTE — Progress Notes (Deleted)
 [address]       [name] [address]             [phone]  HPI: This is a 78 year old male who is s/p aortic valve replacement (using an Inspiris pericardial valve, size 23 mm, coronary artery bypass x 2 (LIMA to the LAD and RSVG from aorta to Diagonal) by Dr. Brennan Bailey on 12/06/2023. He was discharged on 02/**. He presents today for routing post op follow up.  Facility-Administered Medications Ordered in Other Visits  Medication Dose Route Frequency Provider Last Rate Last Admin   0.9 %  sodium chloride infusion (Manually program via Guardrails IV Fluids)   Intravenous Once Alleen Borne, MD       acetaminophen (TYLENOL) tablet 1,000 mg  1,000 mg Oral Q6H Gold, Wayne E, PA-C   1,000 mg at 12/11/23 9147   Or   acetaminophen (TYLENOL) 160 MG/5ML solution 1,000 mg  1,000 mg Per Tube Q6H Gold, Wayne E, PA-C       amiodarone (PACERONE) tablet 400 mg  400 mg Oral TID Eugenio Hoes, MD   400 mg at 12/11/23 8295   apixaban (ELIQUIS) tablet 5 mg  5 mg Oral BID Eugenio Hoes, MD   5 mg at 12/11/23 6213   aspirin EC tablet 81 mg  81 mg Oral Daily Alleen Borne, MD   81 mg at 12/11/23 0865   bisacodyl (DULCOLAX) EC tablet 10 mg  10 mg Oral Daily Gershon Crane E, PA-C   10 mg at 12/11/23 7846   Or   bisacodyl (DULCOLAX) suppository 10 mg  10 mg Rectal Daily Gold, Wayne E, PA-C       dextrose 50 % solution 0-50 mL  0-50 mL Intravenous PRN Gold, Wayne E, PA-C       docusate sodium (COLACE) capsule 200 mg  200 mg Oral Daily Gold, Wayne E, PA-C   200 mg at 12/11/23 9629   ezetimibe (ZETIA) tablet 10 mg  10 mg Oral QPM Gold, Wayne E, PA-C   10 mg at 12/10/23 1737   furosemide (LASIX) injection 60 mg  60 mg Intravenous Daily Eugenio Hoes, MD   60 mg at 12/11/23 0927   insulin aspart (novoLOG) injection 0-24 Units  0-24 Units Subcutaneous TID WC Alleen Borne, MD   2 Units at 12/10/23 1237   insulin glargine-yfgn (SEMGLEE) injection 25 Units  25 Units Subcutaneous Daily Alleen Borne, MD   25 Units at 12/11/23 0940   levothyroxine (SYNTHROID) tablet 25 mcg  25 mcg Oral Q0600 Eugenio Hoes, MD   25 mcg at 12/11/23 0528   metoprolol tartrate (LOPRESSOR) injection 2.5-5 mg  2.5-5 mg Intravenous Q2H PRN Gold, Deniece Portela E, PA-C       metoprolol tartrate (LOPRESSOR) tablet 50 mg  50 mg Oral BID Eugenio Hoes, MD   50 mg at 12/11/23 5284   morphine (PF) 2 MG/ML injection 1-2 mg  1-2 mg Intravenous Q1H PRN Wilmer Floor, RPH       ondansetron [facility]) injection 4 mg  4 mg Intravenous Q6H PRN Gershon Crane E, PA-C   4 mg at 12/08/23 1324   Oral care mouth rinse  15 mL Mouth Rinse PRN Alleen Borne, MD       oxyCODONE (Oxy IR/ROXICODONE) immediate release tablet 5-10 mg  5-10 mg Oral Q3H PRN Gershon Crane E, PA-C   10 mg at 12/08/23 0618   pantoprazole (PROTONIX) EC tablet 40 mg  40 mg Oral Daily Wilmer Floor, RPH   40 mg at 12/11/23 1610   rosuvastatin (CRESTOR) tablet 40 mg  40 mg Oral QPM Gold, Wayne E, PA-C   40 mg at 12/10/23 1738   sodium chloride flush (NS) 0.9 % injection 3 mL  3 mL Intravenous Q12H Gold, Wayne E, PA-C   3 mL at 12/11/23 1002   sodium chloride flush (NS) 0.9 % injection 3 mL  3 mL Intravenous PRN Gold, Wayne E, PA-C       sodium chloride flush (NS) 0.9 % injection 3-10 mL  3-10 mL Intravenous Q12H Gold, Wayne E, PA-C   3 mL at 12/11/23 1002   sodium chloride flush (NS) 0.9 % injection 3-10 mL  3-10 mL Intravenous PRN Gold, Wayne E, PA-C       traMADol (ULTRAM) tablet 50 mg  50 mg Oral Q4H PRN Alleen Borne, MD      Vital Signs:  Physical Exam: CV- Pulmonary- Abdomen- Extremities- Wound-  Diagnostic Tests: ***  Impression and Plan: We reviewed today's chest x ray. There have been no changes to his medications. We discussed The importance of continuing sternal precautions (no lifting more than 10 pounds until 02/03/2024). We also discussed endocarditis prophylaxis.  Endocarditis is a potentially serious infection of heart valves or inside lining  of the heart.  It occurs more commonly in patients with diseased heart valves (such as patient's with aortic or mitral valve disease) and in patients who have undergone heart valve repair or replacement.  Certain surgical and dental procedures may put you at risk, such as dental cleaning, other dental procedures, or any surgery involving the respiratory, urinary, gastrointestinal tract, gallbladder or prostate gland.   To minimize your chances for develooping endocarditis, maintain good oral health and seek prompt medical attention for any infections involving the mouth, teeth, gums, skin or urinary tract.    Always notify your doctor or dentist about your underlying heart valve condition before having any invasive procedures. You will need to take antibiotics before certain procedures, including all routine dental cleanings or other dental procedures.  Your cardiologist or dentist should prescribe these antibiotics for you to be taken ahead of time. Continue to avoid any heavy lifting or strenuous use of your arms or shoulders for at least a total of two months from the time of surgery.  After two months you may gradually increase how much you lift or otherwise use your arms or chest as tolerated, with limits based upon whether or not activities lead to the return of significant discomfort. 2. Continue to avoid any heavy lifting or strenuous use of your arms or shoulders for at least a total of two months from the time of surgery.  After two months, you may gradually increase how much you lift or otherwise use your arms or chest as tolerated, with limits based upon whether or not activities lead to the return of significant discomfort.    We will discuss driving and participation in cardiac rehab at the next office visit. He has an appointment to see cardiology on 02/27 and a follow up echocardiogram on 01/17/2024.     Ardelle Balls, PA-C [facility] [phone]

## 2023-12-11 NOTE — Progress Notes (Signed)
Patient brought to 4E from 2H. VSS. Telemetry box applied, CCMD notified. Patient oriented to room and staff. Call bell in reach. ? ?Kenard Gower, RN  ?

## 2023-12-12 ENCOUNTER — Encounter: Payer: Medicare HMO | Admitting: Thoracic Surgery (Cardiothoracic Vascular Surgery)

## 2023-12-12 LAB — CBC
HCT: 25.7 % — ABNORMAL LOW (ref 39.0–52.0)
Hemoglobin: 9.1 g/dL — ABNORMAL LOW (ref 13.0–17.0)
MCH: 31.7 pg (ref 26.0–34.0)
MCHC: 35.4 g/dL (ref 30.0–36.0)
MCV: 89.5 fL (ref 80.0–100.0)
Platelets: 173 10*3/uL (ref 150–400)
RBC: 2.87 MIL/uL — ABNORMAL LOW (ref 4.22–5.81)
RDW: 13.7 % (ref 11.5–15.5)
WBC: 7.1 10*3/uL (ref 4.0–10.5)
nRBC: 0 % (ref 0.0–0.2)

## 2023-12-12 LAB — BASIC METABOLIC PANEL
Anion gap: 13 (ref 5–15)
BUN: 36 mg/dL — ABNORMAL HIGH (ref 8–23)
CO2: 27 mmol/L (ref 22–32)
Calcium: 8.6 mg/dL — ABNORMAL LOW (ref 8.9–10.3)
Chloride: 95 mmol/L — ABNORMAL LOW (ref 98–111)
Creatinine, Ser: 1.54 mg/dL — ABNORMAL HIGH (ref 0.61–1.24)
GFR, Estimated: 47 mL/min — ABNORMAL LOW (ref >60)
Glucose, Bld: 87 mg/dL (ref 70–99)
Potassium: 3.3 mmol/L — ABNORMAL LOW (ref 3.5–5.1)
Sodium: 135 mmol/L (ref 135–145)

## 2023-12-12 LAB — GLUCOSE, CAPILLARY: Glucose-Capillary: 75 mg/dL (ref 70–99)

## 2023-12-12 LAB — MAGNESIUM: Magnesium: 2.7 mg/dL — ABNORMAL HIGH (ref 1.7–2.4)

## 2023-12-12 MED ORDER — APIXABAN 5 MG PO TABS: 5.0000 mg | ORAL_TABLET | Freq: Two times a day (BID) | ORAL | 2 refills | Status: DC

## 2023-12-12 MED ORDER — METOPROLOL TARTRATE 50 MG PO TABS: 50.0000 mg | ORAL_TABLET | Freq: Two times a day (BID) | ORAL | 1 refills | Status: DC

## 2023-12-12 MED ORDER — FUROSEMIDE 40 MG PO TABS: 40.0000 mg | ORAL_TABLET | Freq: Every day | ORAL | 0 refills | Status: DC

## 2023-12-12 MED ORDER — FUROSEMIDE 40 MG PO TABS
40.0000 mg | ORAL_TABLET | Freq: Every day | ORAL | Status: DC
  Administered 2023-12-12: 40 mg via ORAL
  Filled 2023-12-12: qty 1

## 2023-12-12 MED ORDER — POTASSIUM CHLORIDE CRYS ER 20 MEQ PO TBCR
40.0000 meq | EXTENDED_RELEASE_TABLET | Freq: Two times a day (BID) | ORAL | Status: DC
Start: 2023-12-12 — End: 2023-12-12
  Administered 2023-12-12: 40 meq via ORAL
  Filled 2023-12-12: qty 2

## 2023-12-12 MED ORDER — OXYCODONE HCL 5 MG PO TABS: 5.0000 mg | ORAL_TABLET | Freq: Four times a day (QID) | ORAL | 0 refills | Status: DC | PRN

## 2023-12-12 MED ORDER — POTASSIUM CHLORIDE CRYS ER 20 MEQ PO TBCR: 40.0000 meq | EXTENDED_RELEASE_TABLET | Freq: Every day | ORAL | 0 refills | Status: DC

## 2023-12-12 MED ORDER — AMIODARONE HCL 200 MG PO TABS: 400.0000 mg | ORAL_TABLET | Freq: Three times a day (TID) | ORAL | 1 refills | Status: DC

## 2023-12-12 NOTE — Progress Notes (Signed)
Patient given discharge instructions, medication list and follow up appointments. IV and tele were dcd. CT sutures removed as ordered. And steri strips applied. Will discharge home as ordered. Rada Zegers, Randall An RN

## 2023-12-12 NOTE — Progress Notes (Signed)
6 Days Post-Op Procedure(s) (LRB): AORTIC VALVE REPLACEMENT (AVR) USING INSPIRIS AORTIC VALVE SIZE 23 MM (N/A) CORONARY ARTERY BYPASS GRAFTING (CABG) TIMES TWO USING LEFT INTERNAL MAMMARY ARTERY AND ENDOSCOPICALLY HARVESTED RIGHT GREATER SAPHENOUS VEIN (N/A) TRANSESOPHAGEAL ECHOCARDIOGRAM (TEE) (N/A) Subjective: Generally feels pretty well, doing well with mobility  Objective: Vital signs in last 24 hours: Temp:  [97.7 F (36.5 C)-98.4 F (36.9 C)] 98.1 F (36.7 C) (02/12 2255) Pulse Rate:  [75-119] 93 (02/12 1602) Cardiac Rhythm: Atrial fibrillation (02/12 2100) Resp:  [17-30] 20 (02/13 0258) BP: (98-129)/(59-72) 129/66 (02/13 0258) SpO2:  [92 %-98 %] 98 % (02/12 2100)  Hemodynamic parameters for last 24 hours:    Intake/Output from previous day: 02/12 0701 - 02/13 0700 In: 843 [P.O.:840; I.V.:3] Out: 1825 [Urine:1825] Intake/Output this shift: No intake/output data recorded.  General appearance: alert, cooperative, and no distress Heart: regular rate and rhythm Lungs: clear to auscultation bilaterally Abdomen: benign Extremities: no edema Wound: incis healing well  Lab Results: Recent Labs    12/11/23 0211 12/12/23 0350  WBC 7.3 7.1  HGB 9.1* 9.1*  HCT 25.4* 25.7*  PLT 133* 173   BMET:  Recent Labs    12/11/23 0211 12/12/23 0350  NA 131* 135  K 3.7 3.3*  CL 94* 95*  CO2 24 27  GLUCOSE 104* 87  BUN 42* 36*  CREATININE 1.67* 1.54*  CALCIUM 8.2* 8.6*    PT/INR: No results for input(s): "LABPROT", "INR" in the last 72 hours. ABG    Component Value Date/Time   PHART 7.391 12/06/2023 1849   HCO3 22.3 12/06/2023 1849   TCO2 23 12/06/2023 1849   ACIDBASEDEF 2.0 12/06/2023 1849   O2SAT 96 12/06/2023 1849   CBG (last 3)  Recent Labs    12/11/23 1604 12/11/23 2113 12/12/23 0619  GLUCAP 99 77 75    Meds Scheduled Meds:  sodium chloride   Intravenous Once   amiodarone  400 mg Oral TID   apixaban  5 mg Oral BID   aspirin EC  81 mg Oral Daily    bisacodyl  10 mg Oral Daily   Or   bisacodyl  10 mg Rectal Daily   docusate sodium  200 mg Oral Daily   ezetimibe  10 mg Oral QPM   furosemide  60 mg Intravenous Daily   insulin aspart  0-24 Units Subcutaneous TID WC   insulin glargine-yfgn  25 Units Subcutaneous Daily   levothyroxine  25 mcg Oral Q0600   metoprolol tartrate  50 mg Oral BID   rosuvastatin  40 mg Oral QPM   sodium chloride flush  3 mL Intravenous Q12H   sodium chloride flush  3-10 mL Intravenous Q12H   Continuous Infusions: PRN Meds:.dextrose, metoprolol tartrate, morphine injection, ondansetron (ZOFRAN) IV, mouth rinse, oxyCODONE, sodium chloride flush, sodium chloride flush, traMADol  Xrays DG CHEST PORT 1 VIEW Result Date: 12/11/2023 CLINICAL DATA:  Status post aortic valve repair EXAM: PORTABLE CHEST 1 VIEW COMPARISON:  12/09/2023 FINDINGS: Cardiac shadow is enlarged but stable. Aortic valve replacement is again seen. Left-sided chest tube has been removed in the interval. No significant pneumothorax is noted. Small effusions are noted bilaterally. IMPRESSION: No pneumothorax following chest tube removal. Electronically Signed   By: Alcide Clever M.D.   On: 12/11/2023 09:51    Assessment/Plan: S/P Procedure(s) (LRB): AORTIC VALVE REPLACEMENT (AVR) USING INSPIRIS AORTIC VALVE SIZE 23 MM (N/A) CORONARY ARTERY BYPASS GRAFTING (CABG) TIMES TWO USING LEFT INTERNAL MAMMARY ARTERY AND ENDOSCOPICALLY HARVESTED RIGHT GREATER SAPHENOUS VEIN (  N/A) TRANSESOPHAGEAL ECHOCARDIOGRAM (TEE) (N/A) POD#6  1 afeb, VSS, afib, SR, PVC's, short burst of vtach- HR 70's-110's, on eliquis, amiodarone 2 O2 sats good on RA 3 voiding well, not all measured, not weighed yet today 4 K+ 3.3- replace, will check magnesium 5 creat conts to trend lower, 1.54, will reduce lasix to 40 q day for now 6 H/H stable at 9/25 7 thrombocytopenia resolved 8 BS well controlled- not a diabetic 9 cont routine pulm hygiene and rehab, poss home today, will d/w  MD     LOS: 6 days    Rowe Clack PA-C Pager [phone] 12/12/2023

## 2023-12-12 NOTE — Progress Notes (Signed)
Mobility Specialist Progress Note:    12/12/23 0955  Mobility  Activity Ambulated with assistance in hallway  Level of Assistance Contact guard assist, steadying assist  Assistive Device None  Distance Ambulated (ft) 350 ft  RUE Weight Bearing Per Provider Order NWB  LUE Weight Bearing Per Provider Order NWB  Activity Response Tolerated well  Mobility Referral Yes  Mobility visit 1 Mobility  Mobility Specialist Start Time (ACUTE ONLY) 0945  Mobility Specialist Stop Time (ACUTE ONLY) 0955  Mobility Specialist Time Calculation (min) (ACUTE ONLY) 10 min   Pt received in bed, wife at bedside. Agreeable to mobility session. Ambulated in hallway with CGA, no AD required. Tolerated well, asx throughout, max HR 103 bpm. Returned pt to room, left with all needs met, eager for d/c.    Feliciana Rossetti Mobility Specialist Please contact via SecureChat or  Rehab office at [phone]

## 2023-12-12 NOTE — Care Management Important Message (Signed)
Important Message  Patient Details  Name: [name] MRN: [id number] Date of Birth: May 05, 1946   Important Message Given:  Yes - Medicare IM     Renie Ora 12/12/2023, 11:14 AM

## 2023-12-12 NOTE — TOC Transition Note (Signed)
Transition of Care (TOC) - Discharge Note Donn Pierini RN, BSN Transitions of Care Unit 4E- RN Case Manager See Treatment Team for direct phone #   Patient Details  Name: [name] MRN: [id number] Date of Birth: 07/24/1946  Transition of Care [facility]) CM/SW Contact:  Darrold Span, RN Phone Number: 12/12/2023, 11:35 AM   Clinical Narrative:    Pt stable for transition home today, Wife here to provide transportation home.  No HH needs noted, Pt had TCTS office protocol for [facility] w/ [facility]- liasion Theresa updated.   Pt was to borrow RW, however discharge RN notified CM that pt now needs RW for discharge as they can no longer borrow one. DMR order requested, pt/wife do not have DME provider preference and request RW be shipped to home.   Call made to [facility] liaison for DME-RW need- RW will be shipped to home as per pt/wife request.    Final next level of care: Home/Self Care Barriers to Discharge: Barriers Resolved   Patient Goals and CMS Choice Patient states their goals for this hospitalization and ongoing recovery are:: wants to remain independent          Discharge Placement               Home        Discharge Plan and Services Additional resources added to the After Visit Summary for     Discharge Planning Services: CM Consult Post Acute Care Choice: Durable Medical Equipment, Home Health          DME Arranged: Walker rolling DME Agency: [facility] Date DME Agency Contacted: 12/12/23 Time DME Agency Contacted: 1134 Representative spoke with at DME Agency: Ian Malkin HH Arranged: NA HH Agency: [facility]     Representative spoke with at [facility] Agency: Aggie Cosier  Social Drivers of Health (SDOH) Interventions SDOH Screenings   Food Insecurity: Patient Unable To Answer (12/06/2023)  Housing: Patient Unable To Answer (12/06/2023)  Transportation Needs: Patient Unable To Answer (12/06/2023)  Utilities: Patient Unable To Answer (12/06/2023)   Alcohol Screen: Medium Risk (07/04/2023)  Depression (PHQ2-9): Low Risk  (07/05/2023)  Financial Resource Strain: Low Risk  (07/04/2023)  Physical Activity: Sufficiently Active (07/04/2023)  Social Connections: Unknown (12/06/2023)  Tobacco Use: Low Risk  (12/06/2023)     Readmission Risk Interventions    12/12/2023   11:35 AM  Readmission Risk Prevention Plan  Transportation Screening Complete  Home Care Screening Complete  Medication Review (RN CM) Complete

## 2023-12-12 NOTE — Progress Notes (Signed)
CARDIAC REHAB PHASE I    Post [facility] education including site care, restrictions, heart healthy diet, sternal precautions, IS use at home, home needs at discharge, exercise guidelines and CRP2 reviewed. All questions and concerns addressed. Will refer to [facility] for CRP2. Plan for discharge home today.   [phone] Woodroe Chen, RN BSN 12/12/2023 10:24 AM

## 2023-12-13 ENCOUNTER — Telehealth: Payer: Self-pay

## 2023-12-13 NOTE — Transitions of Care (Post Inpatient/ED Visit) (Signed)
   12/13/2023  Name: [name] MRN: [id number] DOB: 1946/05/31  Today's TOC FU Call Status:    Attempted to reach the patient regarding the most recent Inpatient/ED visit.  Follow Up Plan: Additional outreach attempts will be made to reach the patient to complete the Transitions of Care (Post Inpatient/ED visit) call.   .chs

## 2023-12-16 ENCOUNTER — Telehealth: Payer: Self-pay

## 2023-12-16 ENCOUNTER — Other Ambulatory Visit: Payer: Self-pay | Admitting: Thoracic Surgery (Cardiothoracic Vascular Surgery)

## 2023-12-16 ENCOUNTER — Telehealth: Payer: Self-pay | Admitting: *Deleted

## 2023-12-16 DIAGNOSIS — I25118 Atherosclerotic heart disease of native coronary artery with other forms of angina pectoris: Secondary | ICD-10-CM

## 2023-12-16 DIAGNOSIS — I350 Nonrheumatic aortic (valve) stenosis: Secondary | ICD-10-CM

## 2023-12-16 MED FILL — Heparin Sodium (Porcine) Inj 1000 Unit/ML: Qty: 1000 | Status: AC

## 2023-12-16 MED FILL — Potassium Chloride Inj 2 mEq/ML: INTRAVENOUS | Qty: 40 | Status: AC

## 2023-12-16 MED FILL — Lidocaine HCl Local Preservative Free (PF) Inj 2%: INTRAMUSCULAR | Qty: 14 | Status: AC

## 2023-12-16 NOTE — Transitions of Care (Post Inpatient/ED Visit) (Signed)
   12/16/2023  Name: Raine Elsass MRN: [id number] DOB: 1945/11/06  Today's TOC FU Call Status: Today's TOC FU Call Status:: Unsuccessful Call (2nd Attempt) Unsuccessful Call (2nd Attempt) Date: 12/16/23  Attempted to reach the patient regarding the most recent Inpatient/ED visit.  Follow Up Plan: Additional outreach attempts will be made to reach the patient to complete the Transitions of Care (Post Inpatient/ED visit) call.   Deidre Ala, BSN, RN [place]  VBCI - [employer] [facility] RN Care Manager [phone]

## 2023-12-16 NOTE — Telephone Encounter (Signed)
 This is not the right number for contact. Not able to reach at this time.

## 2023-12-16 NOTE — Telephone Encounter (Signed)
 Please ok those verbal orders  Thanks for the update   Looks like he is on amiodarone and eliquis (re: a fib)   I will cc cardiology and surgeon so they are aware

## 2023-12-16 NOTE — Telephone Encounter (Signed)
 Copied from CRM [id number]. Topic: Clinical - Home Health Verbal Orders >> Dec 16, 2023 11:38 AM Deaijah H wrote: Caller/Agency: Claud Kelp  Callback Number: [phone] Service Requested: Physical Therapy Frequency: 2w3 Any new concerns about the patient? Yes, A-fib,at rest 114-90 beats per minute to 86

## 2023-12-17 ENCOUNTER — Ambulatory Visit: Payer: Medicare HMO

## 2023-12-17 NOTE — Progress Notes (Deleted)
      [address]       [place] [address]             [phone]    HPI: Patient returns for routine postoperative follow-up having undergone CABG x 2 and AVR with 23 mm Edwards Inspiris on 12/06/2023. The patient's early postoperative recovery while in the hospital was notable for Atrial Fibrillation with RVR which chemically converted with Amiodarone.  He has post operative anemia requiring transfusion of packed cells.  He was discharged home on 12/12/2023.  Since hospital discharge the patient reports ***.   Current Outpatient Medications  Medication Sig Dispense Refill   amiodarone (PACERONE) 200 MG tablet Take 2 tablets (400 mg total) by mouth 3 (three) times daily. For 3 days, then twice daily for 3 days, then once daily for 3 days, then 200 mg daily 49 tablet 1   apixaban (ELIQUIS) 5 MG TABS tablet Take 1 tablet (5 mg total) by mouth 2 (two) times daily. 60 tablet 2   aspirin EC 81 MG tablet Take 1 tablet (81 mg total) by mouth daily. Swallow whole. 90 tablet 3   ezetimibe (ZETIA) 10 MG tablet Take 1 tablet (10 mg total) by mouth daily. (Patient taking differently: Take 10 mg by mouth every evening.) 90 tablet 3   furosemide (LASIX) 40 MG tablet Take 1 tablet (40 mg total) by mouth daily. 5 tablet 0   levothyroxine (SYNTHROID) 25 MCG tablet TAKE 1 TABLET BY MOUTH EVERY DAY BEFORE BREAKFAST 90 tablet 1   metoprolol tartrate (LOPRESSOR) 50 MG tablet Take 1 tablet (50 mg total) by mouth 2 (two) times daily. 60 tablet 1   Multiple Vitamin (MULTIVITAMIN WITH MINERALS) TABS tablet Take 1 tablet by mouth in the morning. Centrum Silver     oxyCODONE (OXY IR/ROXICODONE) 5 MG immediate release tablet Take 1 tablet (5 mg total) by mouth every 6 (six) hours as needed for severe pain (pain score 7-10). 28 tablet 0   potassium chloride SA (KLOR-CON M) 20 MEQ tablet Take 2 tablets (40 mEq total) by mouth daily. 5 tablet 0   rosuvastatin (CRESTOR) 40 MG tablet Take 1 tablet (40 mg  total) by mouth daily. (Patient taking differently: Take 40 mg by mouth every evening.) 90 tablet 2   No current facility-administered medications for this visit.    Physical Exam: ***  Diagnostic Tests: ***  A/P:  S/p CABG x 2, AVR Atrial Fibrillation Activity- continue to ambulate 3x daily, may resume driving on March 7 if no longer taking pain medication Sternal precautions as discussed RTC to see Dr. Leafy Ro in 3 weeks   Lowella Dandy, PA-C [facility] [phone]

## 2023-12-17 NOTE — Patient Instructions (Incomplete)
 Discharge Instructions:  1. You may shower, please wash incisions daily with soap and water and keep dry.  If you wish to cover wounds with dressing you may do so but please keep clean and change daily.  No tub baths or swimming until incisions have completely healed.  If your incisions become red or develop any drainage please call our office at [phone]  2. No Driving until cleared by Dr. Karolee Ohs office and you are no longer using narcotic pain medications  3. Monitor your weight daily.. Please use the same scale and weigh at same time... If you gain 5-10 lbs in 48 hours with associated lower extremity swelling, please contact our office at [phone]  4. Fever of 101.5 for at least 24 hours with no source, please contact our office at [phone]  5. Activity- up as tolerated, please walk at least 3 times per day.  Avoid strenuous activity, no lifting, pushing, or pulling with your arms over 8-10 lbs for a minimum of 6 weeks  6. If any questions or concerns arise, please do not hesitate to contact our office at [phone]

## 2023-12-18 ENCOUNTER — Telehealth: Payer: Self-pay | Admitting: *Deleted

## 2023-12-18 NOTE — Telephone Encounter (Signed)
 Patient's wife contacted the office regarding 2lb weight gain since yesterday (216lb-218lb). Per records, d/c weight was 219lb and pre-op weight was 226lb. States patient is not eating much and patient's last dose of lasix was yesterday. States patient is not SOB at rest but gets tired easily after ambulating. Reports legs are slightly swollen. Discussed symptoms and weight gain with Webb Laws, PA. Advised wife to continue to monitor weight, swelling and SOB. Advised to call if worsens. Advised to have patient elevate legs while resting. Wife verbalized understanding.

## 2023-12-19 ENCOUNTER — Emergency Department (HOSPITAL_COMMUNITY): Payer: Medicare HMO

## 2023-12-19 ENCOUNTER — Other Ambulatory Visit: Payer: Self-pay

## 2023-12-19 ENCOUNTER — Telehealth (HOSPITAL_COMMUNITY): Payer: Self-pay

## 2023-12-19 ENCOUNTER — Inpatient Hospital Stay (HOSPITAL_COMMUNITY)
Admission: EM | Admit: 2023-12-19 | Discharge: 2023-12-24 | DRG: 219 | Disposition: A | Discharge status: Home / Self Care | Payer: Medicare HMO | Attending: Thoracic Surgery (Cardiothoracic Vascular Surgery) | Admitting: Thoracic Surgery (Cardiothoracic Vascular Surgery)

## 2023-12-19 ENCOUNTER — Telehealth: Payer: Self-pay

## 2023-12-19 ENCOUNTER — Encounter (HOSPITAL_COMMUNITY): Payer: Self-pay

## 2023-12-19 DIAGNOSIS — Z825 Family history of asthma and other chronic lower respiratory diseases: Secondary | ICD-10-CM

## 2023-12-19 DIAGNOSIS — R918 Other nonspecific abnormal finding of lung field: Secondary | ICD-10-CM | POA: Diagnosis not present

## 2023-12-19 DIAGNOSIS — I9788 Other intraoperative complications of the circulatory system, not elsewhere classified: Secondary | ICD-10-CM | POA: Diagnosis not present

## 2023-12-19 DIAGNOSIS — Z953 Presence of xenogenic heart valve: Secondary | ICD-10-CM

## 2023-12-19 DIAGNOSIS — I10 Essential (primary) hypertension: Secondary | ICD-10-CM | POA: Diagnosis not present

## 2023-12-19 DIAGNOSIS — I3139 Other pericardial effusion (noninflammatory): Secondary | ICD-10-CM

## 2023-12-19 DIAGNOSIS — K5900 Constipation, unspecified: Secondary | ICD-10-CM | POA: Diagnosis present

## 2023-12-19 DIAGNOSIS — I9789 Other postprocedural complications and disorders of the circulatory system, not elsewhere classified: Secondary | ICD-10-CM | POA: Diagnosis not present

## 2023-12-19 DIAGNOSIS — Z7982 Long term (current) use of aspirin: Secondary | ICD-10-CM

## 2023-12-19 DIAGNOSIS — R571 Hypovolemic shock: Secondary | ICD-10-CM | POA: Diagnosis not present

## 2023-12-19 DIAGNOSIS — Z888 Allergy status to other drugs, medicaments and biological substances status: Secondary | ICD-10-CM

## 2023-12-19 DIAGNOSIS — Z860100 Personal history of colon polyps, unspecified: Secondary | ICD-10-CM

## 2023-12-19 DIAGNOSIS — D72829 Elevated white blood cell count, unspecified: Secondary | ICD-10-CM | POA: Diagnosis present

## 2023-12-19 DIAGNOSIS — Y658 Other specified misadventures during surgical and medical care: Secondary | ICD-10-CM | POA: Diagnosis not present

## 2023-12-19 DIAGNOSIS — R0602 Shortness of breath: Secondary | ICD-10-CM | POA: Diagnosis not present

## 2023-12-19 DIAGNOSIS — Z79899 Other long term (current) drug therapy: Secondary | ICD-10-CM

## 2023-12-19 DIAGNOSIS — K7200 Acute and subacute hepatic failure without coma: Secondary | ICD-10-CM | POA: Diagnosis not present

## 2023-12-19 DIAGNOSIS — I952 Hypotension due to drugs: Secondary | ICD-10-CM | POA: Diagnosis not present

## 2023-12-19 DIAGNOSIS — J90 Pleural effusion, not elsewhere classified: Secondary | ICD-10-CM | POA: Diagnosis not present

## 2023-12-19 DIAGNOSIS — Y838 Other surgical procedures as the cause of abnormal reaction of the patient, or of later complication, without mention of misadventure at the time of the procedure: Secondary | ICD-10-CM | POA: Diagnosis present

## 2023-12-19 DIAGNOSIS — E039 Hypothyroidism, unspecified: Secondary | ICD-10-CM | POA: Diagnosis present

## 2023-12-19 DIAGNOSIS — R42 Dizziness and giddiness: Secondary | ICD-10-CM | POA: Diagnosis not present

## 2023-12-19 DIAGNOSIS — Z7901 Long term (current) use of anticoagulants: Secondary | ICD-10-CM

## 2023-12-19 DIAGNOSIS — M1712 Unilateral primary osteoarthritis, left knee: Secondary | ICD-10-CM | POA: Diagnosis present

## 2023-12-19 DIAGNOSIS — I2510 Atherosclerotic heart disease of native coronary artery without angina pectoris: Secondary | ICD-10-CM | POA: Diagnosis present

## 2023-12-19 DIAGNOSIS — Z808 Family history of malignant neoplasm of other organs or systems: Secondary | ICD-10-CM

## 2023-12-19 DIAGNOSIS — N990 Postprocedural (acute) (chronic) kidney failure: Secondary | ICD-10-CM | POA: Diagnosis not present

## 2023-12-19 DIAGNOSIS — Z79890 Hormone replacement therapy (postmenopausal): Secondary | ICD-10-CM

## 2023-12-19 DIAGNOSIS — D62 Acute posthemorrhagic anemia: Secondary | ICD-10-CM | POA: Diagnosis present

## 2023-12-19 DIAGNOSIS — E785 Hyperlipidemia, unspecified: Secondary | ICD-10-CM | POA: Diagnosis present

## 2023-12-19 DIAGNOSIS — R739 Hyperglycemia, unspecified: Secondary | ICD-10-CM | POA: Diagnosis not present

## 2023-12-19 DIAGNOSIS — J918 Pleural effusion in other conditions classified elsewhere: Secondary | ICD-10-CM | POA: Diagnosis present

## 2023-12-19 DIAGNOSIS — J9811 Atelectasis: Secondary | ICD-10-CM | POA: Diagnosis present

## 2023-12-19 DIAGNOSIS — Z833 Family history of diabetes mellitus: Secondary | ICD-10-CM

## 2023-12-19 DIAGNOSIS — R0902 Hypoxemia: Secondary | ICD-10-CM | POA: Diagnosis not present

## 2023-12-19 DIAGNOSIS — E871 Hypo-osmolality and hyponatremia: Secondary | ICD-10-CM | POA: Diagnosis present

## 2023-12-19 DIAGNOSIS — I314 Cardiac tamponade: Secondary | ICD-10-CM | POA: Diagnosis present

## 2023-12-19 DIAGNOSIS — I480 Paroxysmal atrial fibrillation: Secondary | ICD-10-CM | POA: Diagnosis present

## 2023-12-19 DIAGNOSIS — Z951 Presence of aortocoronary bypass graft: Secondary | ICD-10-CM

## 2023-12-19 DIAGNOSIS — Y713 Surgical instruments, materials and cardiovascular devices (including sutures) associated with adverse incidents: Secondary | ICD-10-CM | POA: Diagnosis present

## 2023-12-19 DIAGNOSIS — T4145XA Adverse effect of unspecified anesthetic, initial encounter: Secondary | ICD-10-CM | POA: Diagnosis not present

## 2023-12-19 DIAGNOSIS — R0989 Other specified symptoms and signs involving the circulatory and respiratory systems: Secondary | ICD-10-CM | POA: Diagnosis not present

## 2023-12-19 LAB — BASIC METABOLIC PANEL
Anion gap: 19 — ABNORMAL HIGH (ref 5–15)
BUN: 41 mg/dL — ABNORMAL HIGH (ref 8–23)
CO2: 23 mmol/L (ref 22–32)
Calcium: 9.4 mg/dL (ref 8.9–10.3)
Chloride: 89 mmol/L — ABNORMAL LOW (ref 98–111)
Creatinine, Ser: 2 mg/dL — ABNORMAL HIGH (ref 0.61–1.24)
GFR, Estimated: 34 mL/min — ABNORMAL LOW (ref >60)
Glucose, Bld: 228 mg/dL — ABNORMAL HIGH (ref 70–99)
Potassium: 4.6 mmol/L (ref 3.5–5.1)
Sodium: 131 mmol/L — ABNORMAL LOW (ref 135–145)

## 2023-12-19 LAB — CBC
HCT: 25.8 % — ABNORMAL LOW (ref 39.0–52.0)
HCT: 27 % — ABNORMAL LOW (ref 39.0–52.0)
Hemoglobin: 8.7 g/dL — ABNORMAL LOW (ref 13.0–17.0)
Hemoglobin: 9.3 g/dL — ABNORMAL LOW (ref 13.0–17.0)
MCH: 31.4 pg (ref 26.0–34.0)
MCH: 31.4 pg (ref 26.0–34.0)
MCHC: 33.7 g/dL (ref 30.0–36.0)
MCHC: 34.4 g/dL (ref 30.0–36.0)
MCV: 93.1 fL (ref 80.0–100.0)
MCV: 91.2 fL (ref 80.0–100.0)
Platelets: 427 10*3/uL — ABNORMAL HIGH (ref 150–400)
Platelets: 428 10*3/uL — ABNORMAL HIGH (ref 150–400)
RBC: 2.77 MIL/uL — ABNORMAL LOW (ref 4.22–5.81)
RBC: 2.96 MIL/uL — ABNORMAL LOW (ref 4.22–5.81)
RDW: 13.8 % (ref 11.5–15.5)
RDW: 13.9 % (ref 11.5–15.5)
WBC: 15.8 10*3/uL — ABNORMAL HIGH (ref 4.0–10.5)
WBC: 16.8 10*3/uL — ABNORMAL HIGH (ref 4.0–10.5)
nRBC: 0 % (ref 0.0–0.2)
nRBC: 0.1 % (ref 0.0–0.2)

## 2023-12-19 LAB — ECHOCARDIOGRAM COMPLETE
AR max vel: 2.09 cm2
AV Area VTI: 1.95 cm2
AV Area mean vel: 2.03 cm2
AV Mean grad: 8 mm[Hg]
AV Peak grad: 15.4 mm[Hg]
Ao pk vel: 1.96 m/s
Area-P 1/2: 4.1 cm2
Calc EF: 54.2 %
Height: 68 in
S' Lateral: 3.5 cm
Single Plane A2C EF: 57 %
Single Plane A4C EF: 54.6 %
Weight: 3424 [oz_av]

## 2023-12-19 LAB — MAGNESIUM: Magnesium: 2.5 mg/dL — ABNORMAL HIGH (ref 1.7–2.4)

## 2023-12-19 LAB — GLUCOSE, CAPILLARY
Glucose-Capillary: 209 mg/dL — ABNORMAL HIGH (ref 70–99)
Glucose-Capillary: 164 mg/dL — ABNORMAL HIGH (ref 70–99)

## 2023-12-19 MED ORDER — SORBITOL 70 % SOLN: 30.0000 mL | Freq: Every day | Status: DC | PRN

## 2023-12-19 MED ORDER — ASPIRIN 81 MG PO TBEC: 81.0000 mg | DELAYED_RELEASE_TABLET | Freq: Every day | ORAL | Status: DC

## 2023-12-19 MED ORDER — OXYCODONE HCL 5 MG PO TABS: 5.0000 mg | ORAL_TABLET | ORAL | Status: DC | PRN

## 2023-12-19 MED ORDER — POLYETHYLENE GLYCOL 3350 17 G PO PACK: 17.0000 g | PACK | Freq: Every day | ORAL | Status: DC | PRN

## 2023-12-19 MED ORDER — SODIUM CHLORIDE 0.9% FLUSH
3.0000 mL | Freq: Two times a day (BID) | INTRAVENOUS | Status: DC
  Administered 2023-12-19 – 2023-12-20 (×2): 3 mL via INTRAVENOUS

## 2023-12-19 MED ORDER — ROSUVASTATIN CALCIUM 20 MG PO TABS
40.0000 mg | ORAL_TABLET | Freq: Every day | ORAL | Status: DC
  Administered 2023-12-20: 40 mg via ORAL
  Filled 2023-12-19: qty 2

## 2023-12-19 MED ORDER — ONDANSETRON HCL 4 MG PO TABS: 4.0000 mg | ORAL_TABLET | Freq: Four times a day (QID) | ORAL | Status: DC | PRN

## 2023-12-19 MED ORDER — ONDANSETRON HCL 4 MG/2ML IJ SOLN: 4.0000 mg | Freq: Four times a day (QID) | INTRAMUSCULAR | Status: DC | PRN

## 2023-12-19 MED ORDER — ASPIRIN 81 MG PO TBEC
81.0000 mg | DELAYED_RELEASE_TABLET | Freq: Every day | ORAL | Status: DC
  Administered 2023-12-20 – 2023-12-24 (×5): 81 mg via ORAL
  Filled 2023-12-19 (×5): qty 1

## 2023-12-19 MED ORDER — LEVOTHYROXINE SODIUM 25 MCG PO TABS
25.0000 ug | ORAL_TABLET | Freq: Every day | ORAL | Status: DC
  Administered 2023-12-20 – 2023-12-24 (×5): 25 ug via ORAL
  Filled 2023-12-19 (×5): qty 1

## 2023-12-19 MED ORDER — DIPHENHYDRAMINE HCL 25 MG PO CAPS
25.0000 mg | ORAL_CAPSULE | Freq: Every evening | ORAL | Status: DC | PRN
  Administered 2023-12-19: 25 mg via ORAL
  Filled 2023-12-19: qty 1

## 2023-12-19 MED ORDER — METOPROLOL TARTRATE 50 MG PO TABS: 50.0000 mg | ORAL_TABLET | Freq: Two times a day (BID) | ORAL | Status: DC

## 2023-12-19 MED ORDER — AMIODARONE HCL 200 MG PO TABS: 400.0000 mg | ORAL_TABLET | Freq: Two times a day (BID) | ORAL | Status: DC

## 2023-12-19 MED ORDER — ACETAMINOPHEN 325 MG PO TABS
650.0000 mg | ORAL_TABLET | Freq: Four times a day (QID) | ORAL | Status: DC | PRN
  Administered 2023-12-20: 650 mg via ORAL
  Filled 2023-12-19: qty 2

## 2023-12-19 MED ORDER — INSULIN ASPART 100 UNIT/ML IJ SOLN
0.0000 [IU] | INTRAMUSCULAR | Status: DC
  Administered 2023-12-19: 3 [IU] via SUBCUTANEOUS
  Administered 2023-12-19: 5 [IU] via SUBCUTANEOUS
  Administered 2023-12-20 (×4): 3 [IU] via SUBCUTANEOUS
  Administered 2023-12-20: 5 [IU] via SUBCUTANEOUS
  Administered 2023-12-20: 3 [IU] via SUBCUTANEOUS
  Administered 2023-12-21 (×3): 2 [IU] via SUBCUTANEOUS
  Administered 2023-12-21: 5 [IU] via SUBCUTANEOUS
  Administered 2023-12-21: 3 [IU] via SUBCUTANEOUS
  Administered 2023-12-21: 5 [IU] via SUBCUTANEOUS
  Administered 2023-12-22 (×4): 3 [IU] via SUBCUTANEOUS
  Administered 2023-12-23 (×3): 2 [IU] via SUBCUTANEOUS
  Administered 2023-12-23: 3 [IU] via SUBCUTANEOUS
  Administered 2023-12-24: 2 [IU] via SUBCUTANEOUS

## 2023-12-19 MED ORDER — ACETAMINOPHEN 650 MG RE SUPP: 650.0000 mg | Freq: Four times a day (QID) | RECTAL | Status: DC | PRN

## 2023-12-19 NOTE — H&P (Addendum)
 [address]            [place] [address]          [phone]     [name] is an 78 y.o. male. April 13, 1946 FAO:[id number]   Chief Complaint: Progressive shortness of breath, status post CABG x 2 and bioprosthetic aortic valve replacement for severe aortic stenosis on 12/06/2023.  History of Presenting Illness: Mr. Mitchell Iwanicki. Brull is a 78 year old gentleman with a past medical history notable for coronary artery disease, severe aortic stenosis, hypertension, dyslipidemia, and prediabetes.  He was recently referred to [place] for management of his coronary artery disease and aortic stenosis.  He underwent CABG x 2 and bioprosthetic AVR with a 23 mm Inspiris bioprosthetic valve by Dr. Leafy Ro.  [facility] on 12/06/2023.  His postoperative course was notable for atrial fibrillation managed with amiodarone and conversion back to sinus rhythm by the time of his discharge on postop day 6.  He also had acute kidney injury with a peak creatinine of 2.15 on postop day 2.  His renal function gradually improved after that.  He had good response to diuresis after surgery and was discharged home and weight about 3 kg below his preoperative weight.  He was continued on Lasix for 5 days, apixaban, amiodarone, and metoprolol 50 mg twice daily.  He said he felt he was gradually improving until about 3 days ago when he noticed he began getting aggressively short of breath with even minimal activity.  He notes that was about the same time that he stopped taking the Lasix.  He denies having any chest pain, dizziness, or palpitations.  He is also not aware of having any fever.  Appetite has been poor and he is only had couple of small hard bowel movements since his discharge a week ago.  Mr. Rhew was brought to the emergency room by EMS earlier today for evaluation of progressive shortness of breath.  Dr. Andria Meuse, ED physician, contacted [place] after he performed a  bedside echo showing a large pericardial effusion without tamponade physiology.  A formal echo has been completed and the results are pending at the time of this dictation.  Mr. Fritze has not yet had a chest x-ray or any lab work done since his admission to the emergency room.   Past Medical History: Past Medical History:  Diagnosis Date   Arthritis    LEFT knee   Back pain    Coronary artery disease    Diverticulosis    Heart murmur    History of colon polyps    Hyperlipidemia    on meds   Hypertension    on meds   Hypothyroidism    Moderate aortic stenosis    Pre-diabetes    Thyroid disease    on meds    Past Surgical History: Past Surgical History:  Procedure Laterality Date   AORTIC VALVE REPLACEMENT N/A 12/06/2023   Procedure: AORTIC VALVE REPLACEMENT (AVR) USING INSPIRIS AORTIC VALVE SIZE 23 MM;  Surgeon: Eugenio Hoes, MD;  Location: [facility] OR;  Service: Open Heart Surgery;  Laterality: N/A;   COLONOSCOPY  2018   SA-MAC-suprep-ageq with lavage-tics/int hems/TA x 5 frags   CORONARY ARTERY BYPASS GRAFT N/A 12/06/2023   Procedure: CORONARY ARTERY BYPASS GRAFTING (CABG) TIMES TWO USING LEFT INTERNAL MAMMARY ARTERY AND ENDOSCOPICALLY HARVESTED RIGHT GREATER SAPHENOUS VEIN;  Surgeon: Eugenio Hoes, MD;  Location: [facility] OR;  Service: Open Heart Surgery;  Laterality: N/A;   INGUINAL HERNIA REPAIR Bilateral 02/13/2017   Procedure: LAPAROSCOPIC BILATERAL INGUINAL HERNIA REPAIR WITH MESH;  Surgeon: Abigail Miyamoto, MD;  Location: [facility] OR;  Service: General;  Laterality: Bilateral;   LASIK     POLYPECTOMY  2018   TA x 5   RIGHT HEART CATH AND CORONARY ANGIOGRAPHY N/A 07/03/2023   Procedure: RIGHT HEART CATH AND CORONARY ANGIOGRAPHY;  Surgeon: [name], MD;  Location: [facility] INVASIVE CV LAB;  Service: Cardiovascular;  Laterality: N/A;   TEE WITHOUT CARDIOVERSION N/A 12/06/2023   Procedure: TRANSESOPHAGEAL ECHOCARDIOGRAM (TEE);  Surgeon: Eugenio Hoes, MD;  Location: [facility] OR;  Service: Open  Heart Surgery;  Laterality: N/A;   UMBILICAL HERNIA REPAIR N/A 02/13/2017   Procedure: LAPAROSCOPIC UMBILICAL HERNIA REPAIR;  Surgeon: Abigail Miyamoto, MD;  Location: [facility] OR;  Service: General;  Laterality: N/A;   WISDOM TOOTH EXTRACTION      Family History: Family History  Problem Relation Age of Onset   Cancer Mother        brain tumor   Emphysema Father    Diabetes Father    HIV Brother    Colon cancer Neg Hx    Colon polyps Neg Hx    Esophageal cancer Neg Hx    Rectal cancer Neg Hx    Stomach cancer Neg Hx      Social History:   reports that he has never smoked. He has never used smokeless tobacco. He reports current alcohol use of about 21.0 - 28.0 standard drinks of alcohol per week. He reports that he does not use drugs.  Allergies:  Allergies  Allergen Reactions   Vytorin [Ezetimibe-Simvastatin] Other (See Comments)    back pain    Review of Systems:  Cardiac Review of Systems: Y or N  Chest Pain [ n ] Resting SOB [ n ] Exertional SOB [ y ] Orthopnea y[ ]   Pedal Edema y[ ]  Palpitations [  ] Syncope [ ]  Presyncope [ n ]  General Review of Systems: [Y] = yes [ ] =no  Constitional: recent weight change [ ] ; anorexia [ ] ; fatigue [ ] ; nausea [ ] ; night sweats [ ] ; fever [ ] ; or chills [ ] ;  Dental: poor dentition[ ] ; Last Dentist visit 6 months  Eye : blurred vision [ ] ; diplopia [ ] ; vision changes [ ] ; Amaurosis fugax[ ] ;  Resp: cough [ ] ; wheezing[ ] ; hemoptysis[ ] ; shortness of breath[ ] ; paroxysmal nocturnal dyspnea[ ] ; dyspnea on exertion[ ] ; or orthopnea[ ] ;  GI: gallstones[ ] , vomiting[ ] ; dysphagia[ ] ; melena[ ] ; hematochezia [ ] ; heartburn[ ] ; Hx of Colonoscopy[ ] ;  GU: kidney stones [ ] ; hematuria[ ] ; dysuria [ ] ; nocturia[ ] ; history of obstruction [ ] ;  Skin: rash, swelling[ ] ;, hair loss[ ] ; peripheral edema[ ] ; or itching[ ] ;  Musculosketetal: myalgias[ ] ; joint swelling[ ] ; joint erythema[ ] ;  joint pain[ ] ; back pain[ ] ;  Heme/Lymph: bruising[ ] ;  bleeding[ ] ; anemia[ ] ;  Neuro: TIA[ ] ; headaches[ ] ; stroke[ ] ; vertigo[ ] ; seizures[ ] ; paresthesias[ ] ; difficulty walking[ ] ;  Psych:depression[ ] ; anxiety[ ] ;  Endocrine: diabetes[ ] ; thyroid dysfunction[ ] ;  Immunizations: Flu [ ] ; Pneumococcal[ ] ;  Other:  BP 117/87 (BP Location: Left Arm)   Pulse 90   Temp (!) 97.3 F (36.3 C) (Axillary)   Resp (!) 23   Ht 5\' 8"  (1.727 m)   Wt 97.1 kg   SpO2 100%   BMI 32.54 kg/m  Physical Exam: General appearance: alert, cooperative, and moderate distress Neurologic: intact Heart: Irregularly irregular rhythm, monitor showing atrial fibrillation with ventricular rate around 90 bpm.  There is no obvious murmur. Lungs: Breath sounds are clear but shallow.  He has normal work of breathing on room air although his oxygen saturation is ranged from 90 to 92%. Abdomen: Mild distention, nontender, no palpable masses, active bowel sounds Extremities: No obvious deformities, full.  No peripheral edema Wound: The sternotomy incision is well-approximated, clean, and dry.   Diagnostic Studies and Laboratory Results: Results for orders placed or performed during the hospital encounter of 12/19/23 (from the past 48 hours)  Basic metabolic panel     Status: Abnormal   Collection Time: 12/19/23 12:28 PM  Result Value Ref Range   Sodium 131 (L) 135 - 145 mmol/L   Potassium 4.6 3.5 - 5.1 mmol/L   Chloride 89 (L) 98 - 111 mmol/L   CO2 23 22 - 32 mmol/L   Glucose, Bld 228 (H) 70 - 99 mg/dL    Comment: Glucose reference range applies only to samples taken after fasting for at least 8 hours.   BUN 41 (H) 8 - 23 mg/dL   Creatinine, Ser 1.61 (H) 0.61 - 1.24 mg/dL   Calcium 9.4 8.9 - 09.6 mg/dL   GFR, Estimated 34 (L) >60 mL/min    Comment: (NOTE) Calculated using the CKD-EPI Creatinine Equation (2021)    Anion gap 19 (H) 5 - 15    Comment: Performed at [facility], [address] [place], [place] [address]  CBC     Status: Abnormal    Collection Time: 12/19/23 12:28 PM  Result Value Ref Range   WBC 15.8 (H) 4.0 - 10.5 K/uL   RBC 2.77 (L) 4.22 - 5.81 MIL/uL   Hemoglobin 8.7 (L) 13.0 - 17.0 g/dL   HCT 98.1 (L) 19.1 - 47.8 %   MCV 93.1 80.0 - 100.0 fL   MCH 31.4 26.0 - 34.0 pg   MCHC 33.7 30.0 - 36.0 g/dL   RDW 29.5 62.1 - 30.8 %   Platelets 427 (H) 150 - 400 K/uL   nRBC 0.0 0.0 - 0.2 %    Comment: Performed at [facility], [address] [place], [place] [address]   No results found.   Assessment/Plan  -Progressive shortness of breath over the past 51 days 78 year old gentleman who is 2 weeks status post bioprosthetic aortic valve replacement and CABG x 2.  Bedside point-of-care ultrasound performed by the ER physician shows a large pericardial effusion but no obvious tamponade physiology.  A formal echo has been completed and is pending.  Chest x-ray is pending.  He does not appear to be volume overloaded on exam.  His progressive shortness of breath may be related entirely to the pericardial effusion although he may also have pleural effusions and the recurrent atrial fibrillation may also be attributing to his symptoms.  He is currently hemodynamically stable.  Will admit to progressive care and keep n.p.o. until we have established the severity of the pericardial effusion.  -Postoperative atrial fibrillation: He was in sinus rhythm when he was discharged but now appears to be back in A-fib with controlled ventricular response.  The A-fib may be aggravated by the pericardial effusion.  He is currently on amiodarone 200 mg twice daily, metoprolol 50 mg p.o. twice daily, and apixaban 5 mg daily.  Check K+ and Mg++.  -Postoperative acute kidney injury: Creatinine peaked at 2.15 on  postop day 2 and was around 1.5 at the time of discharge. The creat on admission labe is 2.0 and likely related to pericardial tamponade.  -History of hypertension: BP on admission is reasonable at 117/87.  Continue his current  medications.  -History of dyslipidemia: On rosuvastatin  -History of prediabetes: most recent hemoglobin A1c earlier this month was 5.7 but admission glucose on BMP is over 200. Will monitor and treat with  SSI.  -Expected post-op anemia- Hct is about the same as at discharge 1 week ago.  Will monitor.    Leary Roca, PA-C 12/19/2023, 1:40 PM  Agree with above Willl need subxyphoid window and will plan for tomorrow

## 2023-12-19 NOTE — Progress Notes (Signed)
 Echocardiogram 2D Echocardiogram has been performed.  Fredrick Geoghegan N Tou Hayner,RDCS 12/19/2023, 1:28 PM

## 2023-12-19 NOTE — ED Provider Notes (Signed)
  EMERGENCY DEPARTMENT AT [facility] Provider Note  CSN: [id number] Arrival date & time: 12/19/23 1204  Chief Complaint(s) Shortness of Breath  HPI [name] is a 78 y.o. male here today for shortness of breath.  Patient had a two-vessel bypass, TAVR done on 2/7.  Patient said that he been doing well up until a couple of days ago, beginning this morning, patient was so short of breath just even going to the bathroom.   Past Medical History Past Medical History:  Diagnosis Date   Arthritis    LEFT knee   Back pain    Coronary artery disease    Diverticulosis    Heart murmur    History of colon polyps    Hyperlipidemia    on meds   Hypertension    on meds   Hypothyroidism    Moderate aortic stenosis    Pre-diabetes    Thyroid disease    on meds   Patient Active Problem List   Diagnosis Date Noted   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   Aortic stenosis 07/05/2023   CAD (coronary artery disease) 07/05/2023   Chest pressure 05/27/2023   Urinary hesitancy 03/22/2023   Hyperkalemia 03/22/2023   Hypothyroid 03/08/2021   History of colon polyps 03/08/2021   Alcohol intake above recommended sensible limits 05/13/2019   Hyperlipidemia 02/25/2019   Colon cancer screening 02/12/2017   Routine general medical examination at a health care facility 01/20/2016   Heart murmur, systolic 01/13/2014   Elevated PSA 01/13/2014   Encounter for Medicare annual wellness exam 09/24/2011   Prostate cancer screening 09/24/2011   Osteoarthritis, knee 02/14/2011   Tinnitus 12/09/2009   BACK PAIN, LUMBAR, WITH RADICULOPATHY 10/10/2009   Essential hypertension, benign 08/16/2009   Thrombocytopenia (HCC) 02/15/2009   Prediabetes 01/07/2008   Home Medication(s) Prior to Admission medications   Medication Sig Start Date End Date Taking? Authorizing Provider  amiodarone (PACERONE) 200 MG tablet Take 2 tablets (400 mg total) by mouth 3 (three) times  daily. For 3 days, then twice daily for 3 days, then once daily for 3 days, then 200 mg daily 12/12/23   Gold, [place]dale E, PA-C  apixaban (ELIQUIS) 5 MG TABS tablet Take 1 tablet (5 mg total) by mouth 2 (two) times daily. 12/12/23   Rowe Clack, PA-C  aspirin EC 81 MG tablet Take 1 tablet (81 mg total) by mouth daily. Swallow whole. 06/24/23   Quintella Reichert, MD  ezetimibe (ZETIA) 10 MG tablet Take 1 tablet (10 mg total) by mouth daily. Patient taking differently: Take 10 mg by mouth every evening. 09/23/23 12/22/23  Quintella Reichert, MD  furosemide (LASIX) 40 MG tablet Take 1 tablet (40 mg total) by mouth daily. 12/12/23   Gold, Glenice Laine, PA-C  levothyroxine (SYNTHROID) 25 MCG tablet TAKE 1 TABLET BY MOUTH EVERY DAY BEFORE BREAKFAST 11/22/23   Tower, Audrie Gallus, MD  metoprolol tartrate (LOPRESSOR) 50 MG tablet Take 1 tablet (50 mg total) by mouth 2 (two) times daily. 12/12/23   Rowe Clack, PA-C  Multiple Vitamin (MULTIVITAMIN WITH MINERALS) TABS tablet Take 1 tablet by mouth in the morning. Centrum Silver    [provider]  oxyCODONE (OXY IR/ROXICODONE) 5 MG immediate release tablet Take 1 tablet (5 mg total) by mouth every 6 (six) hours as needed for severe pain (pain score 7-10). 12/12/23   Gold, Wayne E, PA-C  potassium chloride SA (KLOR-CON M) 20 MEQ tablet Take 2 tablets (40  mEq total) by mouth daily. 12/12/23   Gold, Wayne E, PA-C  rosuvastatin (CRESTOR) 40 MG tablet Take 1 tablet (40 mg total) by mouth daily. Patient taking differently: Take 40 mg by mouth every evening. 07/05/23   Tower, Audrie Gallus, MD                                                                                                                                    Past Surgical History Past Surgical History:  Procedure Laterality Date   AORTIC VALVE REPLACEMENT N/A 12/06/2023   Procedure: AORTIC VALVE REPLACEMENT (AVR) USING INSPIRIS AORTIC VALVE SIZE 23 MM;  Surgeon: Eugenio Hoes, MD;  Location: [facility] OR;  Service: Open Heart  Surgery;  Laterality: N/A;   COLONOSCOPY  2018   SA-MAC-suprep-ageq with lavage-tics/int hems/TA x 5 frags   CORONARY ARTERY BYPASS GRAFT N/A 12/06/2023   Procedure: CORONARY ARTERY BYPASS GRAFTING (CABG) TIMES TWO USING LEFT INTERNAL MAMMARY ARTERY AND ENDOSCOPICALLY HARVESTED RIGHT GREATER SAPHENOUS VEIN;  Surgeon: Eugenio Hoes, MD;  Location: [facility] OR;  Service: Open Heart Surgery;  Laterality: N/A;   INGUINAL HERNIA REPAIR Bilateral 02/13/2017   Procedure: LAPAROSCOPIC BILATERAL INGUINAL HERNIA REPAIR WITH MESH;  Surgeon: Abigail Miyamoto, MD;  Location: [facility] OR;  Service: General;  Laterality: Bilateral;   LASIK     POLYPECTOMY  2018   TA x 5   RIGHT HEART CATH AND CORONARY ANGIOGRAPHY N/A 07/03/2023   Procedure: RIGHT HEART CATH AND CORONARY ANGIOGRAPHY;  Surgeon: [name], MD;  Location: [facility] INVASIVE CV LAB;  Service: Cardiovascular;  Laterality: N/A;   TEE WITHOUT CARDIOVERSION N/A 12/06/2023   Procedure: TRANSESOPHAGEAL ECHOCARDIOGRAM (TEE);  Surgeon: Eugenio Hoes, MD;  Location: [facility];  Service: Open Heart Surgery;  Laterality: N/A;   UMBILICAL HERNIA REPAIR N/A 02/13/2017   Procedure: LAPAROSCOPIC UMBILICAL HERNIA REPAIR;  Surgeon: Abigail Miyamoto, MD;  Location: [facility] OR;  Service: General;  Laterality: N/A;   WISDOM TOOTH EXTRACTION     Family History Family History  Problem Relation Age of Onset   Cancer Mother        brain tumor   Emphysema Father    Diabetes Father    HIV Brother    Colon cancer Neg Hx    Colon polyps Neg Hx    Esophageal cancer Neg Hx    Rectal cancer Neg Hx    Stomach cancer Neg Hx     Social History Social History   Tobacco Use   Smoking status: Never   Smokeless tobacco: Never  Vaping Use   Vaping status: Never Used  Substance Use Topics   Alcohol use: Yes    Alcohol/week: 21.0 - 28.0 standard drinks of alcohol    Types: 21 - 28 Cans of beer per week    Comment: 3-4 cans of beer daily   Drug use: No   Allergies Vytorin  [ezetimibe-simvastatin]  Review of Systems Review of Systems  Physical Exam Vital Signs  I have reviewed the triage vital signs BP 117/87 (BP Location: Left Arm)   Pulse 90   Temp (!) 97.3 F (36.3 C) (Axillary)   Resp (!) 23   Ht 5\' 8"  (1.727 m)   Wt 97.1 kg   SpO2 100%   BMI 32.54 kg/m   Physical Exam Vitals reviewed.  Cardiovascular:     Rate and Rhythm: Normal rate.     Heart sounds: Murmur heard.  Pulmonary:     Effort: Pulmonary effort is normal. Tachypnea present.     Breath sounds: No decreased breath sounds.  Musculoskeletal:     Right lower leg: No edema.     Left lower leg: No edema.  Skin:    General: Skin is warm.  Neurological:     Mental Status: He is alert.     ED Results and Treatments Labs (all labs ordered are listed, but only abnormal results are displayed) Labs Reviewed  CBC - Abnormal; Notable for the following components:      Result Value   WBC 15.8 (*)    RBC 2.77 (*)    Hemoglobin 8.7 (*)    HCT 25.8 (*)    Platelets 427 (*)    All other components within normal limits  BASIC METABOLIC PANEL                                                                                                                          Radiology No results found.  Pertinent labs & imaging results that were available during my care of the patient were reviewed by me and considered in my medical decision making (see MDM for details).  Medications Ordered in ED Medications - No data to display                                                                                                                                   Procedures .Critical Care  Performed by: Arletha Pili, DO Authorized by: Arletha Pili, DO   Critical care provider statement:    Critical care time (minutes):  30   Critical care was necessary to treat or prevent imminent or life-threatening deterioration of the following conditions:  Cardiac failure   Critical care was time  spent personally by me on the following activities:  Development of treatment plan with patient or surrogate, discussions with consultants,  evaluation of patient's response to treatment, examination of patient, ordering and review of laboratory studies, ordering and review of radiographic studies, ordering and performing treatments and interventions, pulse oximetry, re-evaluation of patient's condition and review of old charts   (including critical care time)  Medical Decision Making / ED Course   This patient presents to the ED for concern of shortness of breath, this involves an extensive number of treatment options, and is a complaint that carries with it a high risk of complications and morbidity.  The differential diagnosis includes pericardial effusion, tamponade, pulmonary edema, ACS, pulmonary embolism  MDM: With patient's history, performed bedside ultrasound which did show a pericardial effusion.  No tamponade physiology, patient does not have hypotension at this time.  Believe this fluid has accumulated slowly, may progress to tamponade but at that this time does not require pericardiocentesis in the emergency department.  Effusion is large, do believe this is the source of his shortness of breath.  Did reach out to cardiothoracic surgery who requested formal echocardiogram, and admission to their service.  Performed multiple checks on this patient to ensure blood pressure, respirations remained stable.     Additional history obtained: -Additional history obtained from wife at bedside -External records from outside source obtained and reviewed including: Chart review including previous notes, labs, imaging, consultation notes   Lab Tests: -I ordered, reviewed, and interpreted labs.   The pertinent results include:   Labs Reviewed  CBC - Abnormal; Notable for the following components:      Result Value   WBC 15.8 (*)    RBC 2.77 (*)    Hemoglobin 8.7 (*)    HCT 25.8 (*)     Platelets 427 (*)    All other components within normal limits  BASIC METABOLIC PANEL      EKG left bundle branch block, negative for Smith's modified Sgarbossa criteria.  EKG Interpretation Date/Time:    Ventricular Rate:    PR Interval:    QRS Duration:    QT Interval:    QTC Calculation:   R Axis:      Text Interpretation:           Imaging Studies ordered: I ordered imaging studies including  I independently visualized and interpreted imaging. I agree with the radiologist interpretation   Medicines ordered and prescription drug management: No orders of the defined types were placed in this encounter.   -I have reviewed the patients home medicines and have made adjustments as needed  Critical interventions Management of large pericardial effusion, assessment for cardiac tamponade   Cardiac Monitoring: The patient was maintained on a cardiac monitor.  I personally viewed and interpreted the cardiac monitored which showed an underlying rhythm of: Sinus rhythm  Social Determinants of Health:  Factors impacting patients care include: Multiple medical comorbidities including recent bypass surgery and aortic valve replacement   Reevaluation: After the interventions noted above, I reevaluated the patient and found that they have :improved  Co morbidities that complicate the patient evaluation  Past Medical History:  Diagnosis Date   Arthritis    LEFT knee   Back pain    Coronary artery disease    Diverticulosis    Heart murmur    History of colon polyps    Hyperlipidemia    on meds   Hypertension    on meds   Hypothyroidism    Moderate aortic stenosis    Pre-diabetes    Thyroid disease    on meds  Dispostion: Admission to cardiothoracic service.     Final Clinical Impression(s) / ED Diagnoses Final diagnoses:  Pericardial effusion     @PCDICTATION @    Anders Simmonds T, DO 12/19/23 1304

## 2023-12-19 NOTE — Progress Notes (Signed)
 The echocardiogram confirms a large pericardial effusion with constrictive physiology. LVEF is 60-65%, RV function is mildly reduced. The prosthetic aortic valve is functioning normally.  Dr Leafy Ro is aware of findings and asked the interventional cardiology team to review the echo to assess for potential percutaneous drainage.  If not able to drain in the cath lab, will plan for pericardial window tomorrow afternoon.   Gaynelle Arabian, PA-C

## 2023-12-19 NOTE — ED Triage Notes (Signed)
 Pt BIB [facility] from home with c/o [facility]. Pt reports dyspnea on exertion starting Tuesday following valve replacement surgery on 2/7. Pt reports feeling very short of breath upon standing as well as decreased appetite. O2 sats 98% on RA, EMS placed on 3L [place] for comfort. BP per EMS 110/70.

## 2023-12-19 NOTE — Telephone Encounter (Signed)
 Pt insurance is active and benefits verified through [facility]. Co-pay $40.00, DED $0.00/$0.00 met, out of pocket $4,150.00/$173.38 met, co-insurance 0%. No pre-authorization required. Passport, 12/19/23 @ 3:18PM, REF#[id number]   How many CR sessions are covered? (36 visits for TCR, 72 visits for ICR)72 Is this a lifetime maximum or an annual maximum? Annual Has the member used any of these services to date? No Is there a time limit (weeks/months) on start of program and/or program completion? No     Will contact patient to see if he is interested in the Cardiac Rehab Program. If interested, patient will need to complete follow up appt. Once completed, patient will be contacted for scheduling upon review by the RN Navigator.

## 2023-12-19 NOTE — Telephone Encounter (Signed)
 Patient's wife contacted the office concerned about patient's condition. He is s/p AVR/CABG 12/06/23 with Dr. Leafy Ro. She states that he last took Lasix on Tuesday. He has not really eating in the past 2 days but has maintained drinking (soda, water, orange juice, ensures). She states that he has had worsening shortness of breath over the last three days. She states that he is no longer able to walk to the mailbox like he has been 3 days ago. She states that he is clear-minded but stares. States he cannot walk and almost fell when he tried. Looks yellow. He has not had a BM since discharge. No chest pain or dry cough. She states that his feet are some swollen but his legs are not. He does not have any incisional pain and his incisions look ok, per wife. Advised that she should contact EMS, but she did not want him going to the ER due to long wait times and him not feeling well. Advised she would get a call back once a PA was aware. She acknowledged receipt. Appointment was going to be made for today to see PA in the office but spoke with patient's son who is at the home, he states that EMS is at the home now. Advised to contact our office back when available for update. He acknowledged receipt.

## 2023-12-19 NOTE — Progress Notes (Signed)
 MEWS Progress Note  Patient Details Name: Dimitrios Balestrieri MRN: [id number] DOB: 1946-05-14 Today's Date: 12/19/2023   MEWS Flowsheet Documentation:  Assess: MEWS Score Temp: 98.2 F (36.8 C) BP: 115/71 MAP (mmHg): 86 Pulse Rate: 94 ECG Heart Rate: (!) 111 Resp: 20 Level of Consciousness: Alert SpO2: 95 % O2 Device: Room Air Assess: MEWS Score MEWS Temp: 0 MEWS Systolic: 0 MEWS Pulse: 2 MEWS RR: 0 MEWS LOC: 0 MEWS Score: 2 MEWS Score Color: Yellow Assess: SIRS CRITERIA SIRS Temperature : 0 SIRS Respirations : 0 SIRS Pulse: 1 SIRS WBC: 1 SIRS Score Sum : 2 Assess: if the MEWS score is Yellow or Red Were vital signs accurate and taken at a resting state?: Yes Does the patient meet 2 or more of the SIRS criteria?: Yes Does the patient have a confirmed or suspected source of infection?: No MEWS guidelines implemented : Yes, yellow Treat MEWS Interventions: Considered administering scheduled or prn medications/treatments as ordered Take Vital Signs Increase Vital Sign Frequency : Yellow: Q2hr x1, continue Q4hrs until patient remains green for 12hrs Escalate MEWS: Escalate: Yellow: Discuss with charge nurse and consider notifying provider and/or RRT Notify: Charge Nurse/RN Name of Charge Nurse/RN Notified: Judeth Cornfield, RN Provider Notification Provider Name/Title: Dorris Fetch, MD Date Provider Notified: 12/19/23 Time Provider Notified: 1934 Method of Notification: Page Notification Reason: Requested by patient/family (Pt requesting sleeping meds, takes unisom at home)      [employer] C Jguadalupe Opiela 12/19/2023, 8:29 PM

## 2023-12-19 NOTE — Progress Notes (Signed)
 Patient brought to 4E from ED. VSS. Telemetry box applied, CCMD notified. Patient oriented to room and staff. Call bell in reach.  Kenard Gower, RN

## 2023-12-20 ENCOUNTER — Encounter (HOSPITAL_COMMUNITY)
Admission: EM | Disposition: A | Discharge status: Home / Self Care | Payer: Self-pay | Source: Home / Self Care | Attending: Thoracic Surgery (Cardiothoracic Vascular Surgery)

## 2023-12-20 ENCOUNTER — Inpatient Hospital Stay (HOSPITAL_COMMUNITY): Payer: Medicare HMO | Admitting: Anesthesiology

## 2023-12-20 ENCOUNTER — Other Ambulatory Visit: Payer: Self-pay

## 2023-12-20 ENCOUNTER — Observation Stay (HOSPITAL_COMMUNITY): Payer: Medicare HMO

## 2023-12-20 ENCOUNTER — Inpatient Hospital Stay (HOSPITAL_COMMUNITY): Payer: Medicare HMO

## 2023-12-20 ENCOUNTER — Encounter (HOSPITAL_COMMUNITY): Payer: Self-pay | Admitting: Thoracic Surgery (Cardiothoracic Vascular Surgery)

## 2023-12-20 DIAGNOSIS — S2699XA Other injury of heart, unspecified with or without hemopericardium, initial encounter: Secondary | ICD-10-CM | POA: Diagnosis not present

## 2023-12-20 DIAGNOSIS — N990 Postprocedural (acute) (chronic) kidney failure: Secondary | ICD-10-CM | POA: Diagnosis not present

## 2023-12-20 DIAGNOSIS — I9789 Other postprocedural complications and disorders of the circulatory system, not elsewhere classified: Secondary | ICD-10-CM | POA: Diagnosis not present

## 2023-12-20 DIAGNOSIS — J984 Other disorders of lung: Secondary | ICD-10-CM | POA: Diagnosis not present

## 2023-12-20 DIAGNOSIS — N179 Acute kidney failure, unspecified: Secondary | ICD-10-CM | POA: Diagnosis not present

## 2023-12-20 DIAGNOSIS — I4891 Unspecified atrial fibrillation: Secondary | ICD-10-CM | POA: Diagnosis not present

## 2023-12-20 DIAGNOSIS — R739 Hyperglycemia, unspecified: Secondary | ICD-10-CM

## 2023-12-20 DIAGNOSIS — J918 Pleural effusion in other conditions classified elsewhere: Secondary | ICD-10-CM | POA: Diagnosis not present

## 2023-12-20 DIAGNOSIS — I3139 Other pericardial effusion (noninflammatory): Secondary | ICD-10-CM

## 2023-12-20 DIAGNOSIS — I2510 Atherosclerotic heart disease of native coronary artery without angina pectoris: Secondary | ICD-10-CM | POA: Diagnosis not present

## 2023-12-20 DIAGNOSIS — E039 Hypothyroidism, unspecified: Secondary | ICD-10-CM

## 2023-12-20 DIAGNOSIS — K7200 Acute and subacute hepatic failure without coma: Secondary | ICD-10-CM | POA: Diagnosis not present

## 2023-12-20 DIAGNOSIS — T4145XA Adverse effect of unspecified anesthetic, initial encounter: Secondary | ICD-10-CM | POA: Diagnosis not present

## 2023-12-20 DIAGNOSIS — Y658 Other specified misadventures during surgical and medical care: Secondary | ICD-10-CM | POA: Diagnosis not present

## 2023-12-20 DIAGNOSIS — R571 Hypovolemic shock: Secondary | ICD-10-CM

## 2023-12-20 DIAGNOSIS — E871 Hypo-osmolality and hyponatremia: Secondary | ICD-10-CM | POA: Diagnosis not present

## 2023-12-20 DIAGNOSIS — Z4682 Encounter for fitting and adjustment of non-vascular catheter: Secondary | ICD-10-CM | POA: Diagnosis not present

## 2023-12-20 DIAGNOSIS — D72829 Elevated white blood cell count, unspecified: Secondary | ICD-10-CM | POA: Diagnosis not present

## 2023-12-20 DIAGNOSIS — Z808 Family history of malignant neoplasm of other organs or systems: Secondary | ICD-10-CM | POA: Diagnosis not present

## 2023-12-20 DIAGNOSIS — D62 Acute posthemorrhagic anemia: Secondary | ICD-10-CM

## 2023-12-20 DIAGNOSIS — Y713 Surgical instruments, materials and cardiovascular devices (including sutures) associated with adverse incidents: Secondary | ICD-10-CM | POA: Diagnosis not present

## 2023-12-20 DIAGNOSIS — I10 Essential (primary) hypertension: Secondary | ICD-10-CM | POA: Diagnosis not present

## 2023-12-20 DIAGNOSIS — Z953 Presence of xenogenic heart valve: Secondary | ICD-10-CM | POA: Diagnosis not present

## 2023-12-20 DIAGNOSIS — E785 Hyperlipidemia, unspecified: Secondary | ICD-10-CM | POA: Diagnosis not present

## 2023-12-20 DIAGNOSIS — I480 Paroxysmal atrial fibrillation: Secondary | ICD-10-CM | POA: Diagnosis not present

## 2023-12-20 DIAGNOSIS — Y838 Other surgical procedures as the cause of abnormal reaction of the patient, or of later complication, without mention of misadventure at the time of the procedure: Secondary | ICD-10-CM | POA: Diagnosis not present

## 2023-12-20 DIAGNOSIS — M1712 Unilateral primary osteoarthritis, left knee: Secondary | ICD-10-CM | POA: Diagnosis present

## 2023-12-20 DIAGNOSIS — R918 Other nonspecific abnormal finding of lung field: Secondary | ICD-10-CM | POA: Diagnosis not present

## 2023-12-20 DIAGNOSIS — J90 Pleural effusion, not elsewhere classified: Secondary | ICD-10-CM

## 2023-12-20 DIAGNOSIS — I771 Stricture of artery: Secondary | ICD-10-CM | POA: Diagnosis not present

## 2023-12-20 DIAGNOSIS — I952 Hypotension due to drugs: Secondary | ICD-10-CM | POA: Diagnosis not present

## 2023-12-20 DIAGNOSIS — K5900 Constipation, unspecified: Secondary | ICD-10-CM | POA: Diagnosis not present

## 2023-12-20 DIAGNOSIS — Z860100 Personal history of colon polyps, unspecified: Secondary | ICD-10-CM | POA: Diagnosis not present

## 2023-12-20 DIAGNOSIS — R0602 Shortness of breath: Secondary | ICD-10-CM | POA: Diagnosis not present

## 2023-12-20 DIAGNOSIS — J9811 Atelectasis: Secondary | ICD-10-CM | POA: Diagnosis not present

## 2023-12-20 DIAGNOSIS — I314 Cardiac tamponade: Secondary | ICD-10-CM | POA: Diagnosis not present

## 2023-12-20 DIAGNOSIS — Z888 Allergy status to other drugs, medicaments and biological substances status: Secondary | ICD-10-CM | POA: Diagnosis not present

## 2023-12-20 HISTORY — PX: SUBXYPHOID PERICARDIAL WINDOW: SHX5075

## 2023-12-20 HISTORY — PX: CARDIOVERSION: SHX1299

## 2023-12-20 HISTORY — PX: TEE WITHOUT CARDIOVERSION: SHX5443

## 2023-12-20 HISTORY — PX: PERICARDIAL FLUID DRAINAGE: SHX5100

## 2023-12-20 LAB — CBC
HCT: 21.8 % — ABNORMAL LOW (ref 39.0–52.0)
HCT: 28.3 % — ABNORMAL LOW (ref 39.0–52.0)
Hemoglobin: 7.4 g/dL — ABNORMAL LOW (ref 13.0–17.0)
Hemoglobin: 10 g/dL — ABNORMAL LOW (ref 13.0–17.0)
MCH: 29.7 pg (ref 26.0–34.0)
MCH: 29.7 pg (ref 26.0–34.0)
MCHC: 33.9 g/dL (ref 30.0–36.0)
MCHC: 35.3 g/dL (ref 30.0–36.0)
MCV: 87.6 fL (ref 80.0–100.0)
MCV: 84 fL (ref 80.0–100.0)
Platelets: 304 10*3/uL (ref 150–400)
Platelets: 264 10*3/uL (ref 150–400)
RBC: 2.49 MIL/uL — ABNORMAL LOW (ref 4.22–5.81)
RBC: 3.37 MIL/uL — ABNORMAL LOW (ref 4.22–5.81)
RDW: 18 % — ABNORMAL HIGH (ref 11.5–15.5)
RDW: 17.7 % — ABNORMAL HIGH (ref 11.5–15.5)
WBC: 20.6 10*3/uL — ABNORMAL HIGH (ref 4.0–10.5)
WBC: 17.5 10*3/uL — ABNORMAL HIGH (ref 4.0–10.5)
nRBC: 0.1 % (ref 0.0–0.2)
nRBC: 0.1 % (ref 0.0–0.2)

## 2023-12-20 LAB — FIBRINOGEN: Fibrinogen: 536 mg/dL — ABNORMAL HIGH (ref 210–475)

## 2023-12-20 LAB — POCT I-STAT 7, (LYTES, BLD GAS, ICA,H+H)
Acid-base deficit: 4 mmol/L — ABNORMAL HIGH (ref 0.0–2.0)
Acid-base deficit: 4 mmol/L — ABNORMAL HIGH (ref 0.0–2.0)
Bicarbonate: 20.8 mmol/L (ref 20.0–28.0)
Bicarbonate: 19.5 mmol/L — ABNORMAL LOW (ref 20.0–28.0)
Calcium, Ion: 1.07 mmol/L — ABNORMAL LOW (ref 1.15–1.40)
Calcium, Ion: 1.02 mmol/L — ABNORMAL LOW (ref 1.15–1.40)
HCT: 20 % — ABNORMAL LOW (ref 39.0–52.0)
HCT: 20 % — ABNORMAL LOW (ref 39.0–52.0)
Hemoglobin: 6.8 g/dL — CL (ref 13.0–17.0)
Hemoglobin: 6.8 g/dL — CL (ref 13.0–17.0)
O2 Saturation: 100 %
O2 Saturation: 95 %
Patient temperature: 97.6
Potassium: 4.9 mmol/L (ref 3.5–5.1)
Potassium: 5.2 mmol/L — ABNORMAL HIGH (ref 3.5–5.1)
Sodium: 129 mmol/L — ABNORMAL LOW (ref 135–145)
Sodium: 130 mmol/L — ABNORMAL LOW (ref 135–145)
TCO2: 22 mmol/L (ref 22–32)
TCO2: 20 mmol/L — ABNORMAL LOW (ref 22–32)
pCO2 arterial: 34.9 mm[Hg] (ref 32–48)
pCO2 arterial: 28.3 mm[Hg] — ABNORMAL LOW (ref 32–48)
pH, Arterial: 7.383 (ref 7.35–7.45)
pH, Arterial: 7.445 (ref 7.35–7.45)
pO2, Arterial: 311 mm[Hg] — ABNORMAL HIGH (ref 83–108)
pO2, Arterial: 71 mm[Hg] — ABNORMAL LOW (ref 83–108)

## 2023-12-20 LAB — BASIC METABOLIC PANEL
Anion gap: 18 — ABNORMAL HIGH (ref 5–15)
Anion gap: 15 (ref 5–15)
BUN: 53 mg/dL — ABNORMAL HIGH (ref 8–23)
BUN: 61 mg/dL — ABNORMAL HIGH (ref 8–23)
CO2: 22 mmol/L (ref 22–32)
CO2: 19 mmol/L — ABNORMAL LOW (ref 22–32)
Calcium: 9.1 mg/dL (ref 8.9–10.3)
Calcium: 7.9 mg/dL — ABNORMAL LOW (ref 8.9–10.3)
Chloride: 88 mmol/L — ABNORMAL LOW (ref 98–111)
Chloride: 94 mmol/L — ABNORMAL LOW (ref 98–111)
Creatinine, Ser: 2.35 mg/dL — ABNORMAL HIGH (ref 0.61–1.24)
Creatinine, Ser: 2.9 mg/dL — ABNORMAL HIGH (ref 0.61–1.24)
GFR, Estimated: 28 mL/min — ABNORMAL LOW (ref >60)
GFR, Estimated: 22 mL/min — ABNORMAL LOW (ref >60)
Glucose, Bld: 178 mg/dL — ABNORMAL HIGH (ref 70–99)
Glucose, Bld: 192 mg/dL — ABNORMAL HIGH (ref 70–99)
Potassium: 4.6 mmol/L (ref 3.5–5.1)
Potassium: 4.8 mmol/L (ref 3.5–5.1)
Sodium: 128 mmol/L — ABNORMAL LOW (ref 135–145)
Sodium: 128 mmol/L — ABNORMAL LOW (ref 135–145)

## 2023-12-20 LAB — POCT I-STAT, CHEM 8
BUN: 58 mg/dL — ABNORMAL HIGH (ref 8–23)
Calcium, Ion: 1.07 mmol/L — ABNORMAL LOW (ref 1.15–1.40)
Chloride: 92 mmol/L — ABNORMAL LOW (ref 98–111)
Creatinine, Ser: 2.8 mg/dL — ABNORMAL HIGH (ref 0.61–1.24)
Glucose, Bld: 177 mg/dL — ABNORMAL HIGH (ref 70–99)
HCT: 31 % — ABNORMAL LOW (ref 39.0–52.0)
Hemoglobin: 10.5 g/dL — ABNORMAL LOW (ref 13.0–17.0)
Potassium: 5.1 mmol/L (ref 3.5–5.1)
Sodium: 127 mmol/L — ABNORMAL LOW (ref 135–145)
TCO2: 22 mmol/L (ref 22–32)

## 2023-12-20 LAB — GLUCOSE, CAPILLARY
Glucose-Capillary: 166 mg/dL — ABNORMAL HIGH (ref 70–99)
Glucose-Capillary: 168 mg/dL — ABNORMAL HIGH (ref 70–99)
Glucose-Capillary: 180 mg/dL — ABNORMAL HIGH (ref 70–99)
Glucose-Capillary: 192 mg/dL — ABNORMAL HIGH (ref 70–99)
Glucose-Capillary: 194 mg/dL — ABNORMAL HIGH (ref 70–99)
Glucose-Capillary: 228 mg/dL — ABNORMAL HIGH (ref 70–99)

## 2023-12-20 LAB — PREPARE RBC (CROSSMATCH)

## 2023-12-20 LAB — ECHO INTRAOPERATIVE TEE
Height: 68 in
Weight: 3424 [oz_av]

## 2023-12-20 LAB — PROTIME-INR
INR: 2.4 — ABNORMAL HIGH (ref 0.8–1.2)
Prothrombin Time: 26.5 s — ABNORMAL HIGH (ref 11.4–15.2)

## 2023-12-20 LAB — HEMOGLOBIN AND HEMATOCRIT, BLOOD
HCT: 27.7 % — ABNORMAL LOW (ref 39.0–52.0)
Hemoglobin: 9.8 g/dL — ABNORMAL LOW (ref 13.0–17.0)

## 2023-12-20 LAB — MAGNESIUM: Magnesium: 2.3 mg/dL (ref 1.7–2.4)

## 2023-12-20 LAB — APTT
aPTT: 39 s — ABNORMAL HIGH (ref 24–36)
aPTT: 39 s — ABNORMAL HIGH (ref 24–36)
aPTT: 39 s — ABNORMAL HIGH (ref 24–36)

## 2023-12-20 SURGERY — CREATION, PERICARDIAL WINDOW, SUBXIPHOID APPROACH
Anesthesia: General | Site: Chest

## 2023-12-20 MED ORDER — METOPROLOL TARTRATE 5 MG/5ML IV SOLN: 2.5000 mg | INTRAVENOUS | Status: DC | PRN

## 2023-12-20 MED ORDER — SODIUM CHLORIDE 0.9% FLUSH: 3.0000 mL | INTRAVENOUS | Status: DC | PRN

## 2023-12-20 MED ORDER — ASPIRIN 81 MG PO TBEC: 81.0000 mg | DELAYED_RELEASE_TABLET | Freq: Every day | ORAL | Status: DC

## 2023-12-20 MED ORDER — PANTOPRAZOLE SODIUM 40 MG PO TBEC: 40.0000 mg | DELAYED_RELEASE_TABLET | Freq: Every day | ORAL | Status: DC

## 2023-12-20 MED ORDER — METOLAZONE 5 MG PO TABS: 5.0000 mg | ORAL_TABLET | Freq: Once | ORAL | Status: DC

## 2023-12-20 MED ORDER — ONDANSETRON HCL 4 MG/2ML IJ SOLN: 4.0000 mg | Freq: Four times a day (QID) | INTRAMUSCULAR | Status: DC | PRN

## 2023-12-20 MED ORDER — LIDOCAINE 2% (20 MG/ML) 5 ML SYRINGE
INTRAMUSCULAR | Status: AC
  Filled 2023-12-20: qty 5

## 2023-12-20 MED ORDER — SODIUM CHLORIDE 0.9 % IV SOLN: INTRAVENOUS | Status: DC

## 2023-12-20 MED ORDER — MIDAZOLAM HCL 2 MG/2ML IJ SOLN
INTRAMUSCULAR | Status: AC
  Filled 2023-12-20: qty 2

## 2023-12-20 MED ORDER — SUGAMMADEX SODIUM 200 MG/2ML IV SOLN
INTRAVENOUS | Status: DC | PRN
  Administered 2023-12-20: 200 mg via INTRAVENOUS

## 2023-12-20 MED ORDER — BISACODYL 10 MG RE SUPP
10.0000 mg | Freq: Once | RECTAL | Status: AC
  Administered 2023-12-20: 10 mg via RECTAL
  Filled 2023-12-20: qty 1

## 2023-12-20 MED ORDER — SUCCINYLCHOLINE CHLORIDE 200 MG/10ML IV SOSY
PREFILLED_SYRINGE | INTRAVENOUS | Status: DC | PRN
  Administered 2023-12-20: 120 mg via INTRAVENOUS

## 2023-12-20 MED ORDER — PHENYLEPHRINE 80 MCG/ML (10ML) SYRINGE FOR IV PUSH (FOR BLOOD PRESSURE SUPPORT)
PREFILLED_SYRINGE | INTRAVENOUS | Status: DC | PRN
  Administered 2023-12-20 (×2): 160 ug via INTRAVENOUS

## 2023-12-20 MED ORDER — CHLORHEXIDINE GLUCONATE 0.12 % MT SOLN: 15.0000 mL | Freq: Once | OROMUCOSAL | Status: DC

## 2023-12-20 MED ORDER — FUROSEMIDE 10 MG/ML IJ SOLN: 40.0000 mg | Freq: Two times a day (BID) | INTRAMUSCULAR | Status: DC

## 2023-12-20 MED ORDER — FUROSEMIDE 10 MG/ML IJ SOLN
80.0000 mg | Freq: Once | INTRAMUSCULAR | Status: AC
  Administered 2023-12-20: 80 mg via INTRAVENOUS

## 2023-12-20 MED ORDER — FUROSEMIDE 10 MG/ML IJ SOLN
INTRAMUSCULAR | Status: AC
  Filled 2023-12-20: qty 4

## 2023-12-20 MED ORDER — MIDAZOLAM HCL 2 MG/2ML IJ SOLN: 2.0000 mg | INTRAMUSCULAR | Status: DC | PRN

## 2023-12-20 MED ORDER — PANTOPRAZOLE SODIUM 40 MG IV SOLR
40.0000 mg | Freq: Every day | INTRAVENOUS | Status: DC
  Administered 2023-12-20: 40 mg via INTRAVENOUS
  Filled 2023-12-20: qty 10

## 2023-12-20 MED ORDER — ONDANSETRON HCL 4 MG/2ML IJ SOLN
INTRAMUSCULAR | Status: DC | PRN
  Administered 2023-12-20: 4 mg via INTRAVENOUS

## 2023-12-20 MED ORDER — BISACODYL 10 MG RE SUPP
10.0000 mg | Freq: Every day | RECTAL | Status: DC
  Filled 2023-12-20: qty 1

## 2023-12-20 MED ORDER — SODIUM CHLORIDE 0.9% FLUSH
3.0000 mL | Freq: Two times a day (BID) | INTRAVENOUS | Status: DC
  Administered 2023-12-20: 3 mL via INTRAVENOUS
  Administered 2023-12-20: 10 mL via INTRAVENOUS

## 2023-12-20 MED ORDER — CHLORHEXIDINE GLUCONATE 0.12 % MT SOLN
15.0000 mL | Freq: Once | OROMUCOSAL | Status: DC
  Filled 2023-12-20: qty 15

## 2023-12-20 MED ORDER — SORBITOL 70 % SOLN
30.0000 mL | Freq: Once | Status: AC
  Administered 2023-12-20: 30 mL via ORAL
  Filled 2023-12-20: qty 30

## 2023-12-20 MED ORDER — ACETAMINOPHEN 500 MG PO TABS
1000.0000 mg | ORAL_TABLET | Freq: Four times a day (QID) | ORAL | Status: DC
Start: 2023-12-21 — End: 2023-12-24
  Administered 2023-12-20 – 2023-12-24 (×14): 1000 mg via ORAL
  Filled 2023-12-20 (×15): qty 2

## 2023-12-20 MED ORDER — DEXAMETHASONE SODIUM PHOSPHATE 10 MG/ML IJ SOLN
INTRAMUSCULAR | Status: DC | PRN
  Administered 2023-12-20: 10 mg via INTRAVENOUS

## 2023-12-20 MED ORDER — ACETAMINOPHEN 160 MG/5ML PO SOLN: 1000.0000 mg | Freq: Four times a day (QID) | ORAL | Status: DC

## 2023-12-20 MED ORDER — TEMAZEPAM 15 MG PO CAPS: 15.0000 mg | ORAL_CAPSULE | Freq: Once | ORAL | Status: DC | PRN

## 2023-12-20 MED ORDER — SODIUM CHLORIDE 0.9% FLUSH
3.0000 mL | Freq: Two times a day (BID) | INTRAVENOUS | Status: DC
  Administered 2023-12-20: 3 mL via INTRAVENOUS

## 2023-12-20 MED ORDER — ROSUVASTATIN CALCIUM 20 MG PO TABS
40.0000 mg | ORAL_TABLET | Freq: Every evening | ORAL | Status: DC
  Administered 2023-12-21: 40 mg via ORAL
  Filled 2023-12-20: qty 2

## 2023-12-20 MED ORDER — CEFAZOLIN SODIUM-DEXTROSE 2-3 GM-%(50ML) IV SOLR
INTRAVENOUS | Status: DC | PRN
  Administered 2023-12-20: 2 g via INTRAVENOUS

## 2023-12-20 MED ORDER — ONDANSETRON HCL 4 MG/2ML IJ SOLN
INTRAMUSCULAR | Status: AC
  Filled 2023-12-20: qty 2

## 2023-12-20 MED ORDER — DEXMEDETOMIDINE HCL IN NACL 400 MCG/100ML IV SOLN
INTRAVENOUS | Status: DC | PRN
  Administered 2023-12-20: .7 ug/kg/h via INTRAVENOUS

## 2023-12-20 MED ORDER — MORPHINE SULFATE (PF) 2 MG/ML IV SOLN
1.0000 mg | INTRAVENOUS | Status: DC | PRN
  Administered 2023-12-20 (×2): 2 mg via INTRAVENOUS
  Filled 2023-12-20 (×2): qty 1

## 2023-12-20 MED ORDER — NOREPINEPHRINE 4 MG/250ML-% IV SOLN
INTRAVENOUS | Status: AC
  Filled 2023-12-20: qty 250

## 2023-12-20 MED ORDER — ACETAMINOPHEN 160 MG/5ML PO SOLN
650.0000 mg | Freq: Once | ORAL | Status: AC
  Administered 2023-12-20: 650 mg
  Filled 2023-12-20: qty 20.3

## 2023-12-20 MED ORDER — METOPROLOL TARTRATE 12.5 MG HALF TABLET
12.5000 mg | ORAL_TABLET | Freq: Once | ORAL | Status: AC
  Administered 2023-12-20: 12.5 mg via ORAL
  Filled 2023-12-20: qty 1

## 2023-12-20 MED ORDER — ALBUMIN HUMAN 5 % IV SOLN: INTRAVENOUS | Status: DC | PRN

## 2023-12-20 MED ORDER — BISACODYL 5 MG PO TBEC
10.0000 mg | DELAYED_RELEASE_TABLET | Freq: Every day | ORAL | Status: DC
  Administered 2023-12-21 – 2023-12-23 (×3): 10 mg via ORAL
  Filled 2023-12-20 (×3): qty 2

## 2023-12-20 MED ORDER — ADULT MULTIVITAMIN W/MINERALS CH
1.0000 | ORAL_TABLET | Freq: Every morning | ORAL | Status: DC
  Administered 2023-12-21 – 2023-12-24 (×4): 1 via ORAL
  Filled 2023-12-20 (×5): qty 1

## 2023-12-20 MED ORDER — DEXAMETHASONE SODIUM PHOSPHATE 10 MG/ML IJ SOLN
INTRAMUSCULAR | Status: AC
  Filled 2023-12-20: qty 1

## 2023-12-20 MED ORDER — PROPOFOL 10 MG/ML IV BOLUS: INTRAVENOUS | Status: DC | PRN

## 2023-12-20 MED ORDER — ORAL CARE MOUTH RINSE
15.0000 mL | Freq: Once | OROMUCOSAL | Status: DC
Start: 2023-12-20 — End: 2023-12-20

## 2023-12-20 MED ORDER — NOREPINEPHRINE BITARTRATE 1 MG/ML IV SOLN
INTRAVENOUS | Status: DC | PRN
  Administered 2023-12-20 (×3): 1 mL via INTRAVENOUS

## 2023-12-20 MED ORDER — LACTATED RINGERS IV SOLN: INTRAVENOUS | Status: DC | PRN

## 2023-12-20 MED ORDER — POTASSIUM CHLORIDE 10 MEQ/50ML IV SOLN: 10.0000 meq | INTRAVENOUS | Status: DC

## 2023-12-20 MED ORDER — SODIUM CHLORIDE 0.9% FLUSH
3.0000 mL | Freq: Two times a day (BID) | INTRAVENOUS | Status: DC
  Administered 2023-12-20: 10 mL via INTRAVENOUS
  Administered 2023-12-20: 3 mL via INTRAVENOUS

## 2023-12-20 MED ORDER — CHLORHEXIDINE GLUCONATE CLOTH 2 % EX PADS
6.0000 | MEDICATED_PAD | Freq: Once | CUTANEOUS | Status: AC
  Administered 2023-12-20: 6 via TOPICAL

## 2023-12-20 MED ORDER — ROCURONIUM BROMIDE 10 MG/ML (PF) SYRINGE
PREFILLED_SYRINGE | INTRAVENOUS | Status: AC
  Filled 2023-12-20: qty 10

## 2023-12-20 MED ORDER — FENTANYL CITRATE (PF) 250 MCG/5ML IJ SOLN
INTRAMUSCULAR | Status: AC
Start: 2023-12-20 — End: ?
  Filled 2023-12-20: qty 5

## 2023-12-20 MED ORDER — SODIUM CHLORIDE 0.9 % IV BOLUS
500.0000 mL | Freq: Once | INTRAVENOUS | Status: AC
  Administered 2023-12-20: 500 mL via INTRAVENOUS

## 2023-12-20 MED ORDER — FENTANYL CITRATE (PF) 250 MCG/5ML IJ SOLN
INTRAMUSCULAR | Status: DC | PRN
Start: 2023-12-20 — End: 2023-12-20
  Administered 2023-12-20: 50 ug via INTRAVENOUS
  Administered 2023-12-20 (×2): 25 ug via INTRAVENOUS
  Administered 2023-12-20: 50 ug via INTRAVENOUS

## 2023-12-20 MED ORDER — NOREPINEPHRINE 4 MG/250ML-% IV SOLN: 0.0000 ug/min | INTRAVENOUS | Status: DC

## 2023-12-20 MED ORDER — ROCURONIUM BROMIDE 10 MG/ML (PF) SYRINGE
PREFILLED_SYRINGE | INTRAVENOUS | Status: DC | PRN
Start: 2023-12-20 — End: 2023-12-20
  Administered 2023-12-20: 20 mg via INTRAVENOUS
  Administered 2023-12-20: 30 mg via INTRAVENOUS

## 2023-12-20 MED ORDER — AMIODARONE HCL IN DEXTROSE 360-4.14 MG/200ML-% IV SOLN
30.0000 mg/h | INTRAVENOUS | Status: DC
  Filled 2023-12-20: qty 200

## 2023-12-20 MED ORDER — SODIUM CHLORIDE 0.9% FLUSH
3.0000 mL | Freq: Two times a day (BID) | INTRAVENOUS | Status: DC
  Administered 2023-12-20: 10 mL via INTRAVENOUS

## 2023-12-20 MED ORDER — PHENYLEPHRINE 80 MCG/ML (10ML) SYRINGE FOR IV PUSH (FOR BLOOD PRESSURE SUPPORT)
PREFILLED_SYRINGE | INTRAVENOUS | Status: AC
  Filled 2023-12-20: qty 10

## 2023-12-20 MED ORDER — CHLORHEXIDINE GLUCONATE CLOTH 2 % EX PADS
6.0000 | MEDICATED_PAD | Freq: Every day | CUTANEOUS | Status: DC
  Administered 2023-12-20 – 2023-12-22 (×3): 6 via TOPICAL

## 2023-12-20 MED ORDER — BISACODYL 5 MG PO TBEC: 5.0000 mg | DELAYED_RELEASE_TABLET | Freq: Once | ORAL | Status: DC

## 2023-12-20 MED ORDER — DOCUSATE SODIUM 100 MG PO CAPS
200.0000 mg | ORAL_CAPSULE | Freq: Every day | ORAL | Status: DC
  Administered 2023-12-21 – 2023-12-23 (×3): 200 mg via ORAL
  Filled 2023-12-20 (×3): qty 2

## 2023-12-20 MED ORDER — SODIUM CHLORIDE 0.9 % IV SOLN: INTRAVENOUS | Status: DC | PRN

## 2023-12-20 MED ORDER — CEFAZOLIN SODIUM-DEXTROSE 2-4 GM/100ML-% IV SOLN
2.0000 g | Freq: Three times a day (TID) | INTRAVENOUS | Status: DC
  Administered 2023-12-20 – 2023-12-21 (×4): 2 g via INTRAVENOUS
  Filled 2023-12-20 (×4): qty 100

## 2023-12-20 MED ORDER — MIDAZOLAM HCL 2 MG/2ML IJ SOLN
INTRAMUSCULAR | Status: DC | PRN
  Administered 2023-12-20 (×2): 2 mg via INTRAVENOUS

## 2023-12-20 MED ORDER — EZETIMIBE 10 MG PO TABS
10.0000 mg | ORAL_TABLET | Freq: Every evening | ORAL | Status: DC
  Administered 2023-12-20 – 2023-12-23 (×4): 10 mg via ORAL
  Filled 2023-12-20 (×4): qty 1

## 2023-12-20 MED ORDER — CHLORHEXIDINE GLUCONATE 0.12 % MT SOLN
15.0000 mL | OROMUCOSAL | Status: AC
  Administered 2023-12-20: 15 mL via OROMUCOSAL
  Filled 2023-12-20: qty 15

## 2023-12-20 MED ORDER — OXYCODONE HCL 5 MG PO TABS
5.0000 mg | ORAL_TABLET | ORAL | Status: DC | PRN
  Administered 2023-12-20: 5 mg via ORAL
  Filled 2023-12-20: qty 1

## 2023-12-20 MED ORDER — EPHEDRINE SULFATE-NACL 50-0.9 MG/10ML-% IV SOSY
PREFILLED_SYRINGE | INTRAVENOUS | Status: DC | PRN
Start: 2023-12-20 — End: 2023-12-20
  Administered 2023-12-20 (×2): 5 mg via INTRAVENOUS

## 2023-12-20 MED ORDER — APIXABAN 5 MG PO TABS: 5.0000 mg | ORAL_TABLET | Freq: Two times a day (BID) | ORAL | Status: DC

## 2023-12-20 MED ORDER — AMIODARONE HCL IN DEXTROSE 360-4.14 MG/200ML-% IV SOLN
30.0000 mg/h | INTRAVENOUS | Status: DC
  Administered 2023-12-20 (×2): 30 mg/h via INTRAVENOUS
  Filled 2023-12-20: qty 200

## 2023-12-20 MED ORDER — SODIUM CHLORIDE 0.9% FLUSH: 3.0000 mL | Freq: Two times a day (BID) | INTRAVENOUS | Status: DC

## 2023-12-20 MED ORDER — KETAMINE HCL 10 MG/ML IJ SOLN
INTRAMUSCULAR | Status: DC | PRN
  Administered 2023-12-20: 50 mg via INTRAVENOUS

## 2023-12-20 MED ORDER — METOPROLOL TARTRATE 50 MG PO TABS
50.0000 mg | ORAL_TABLET | Freq: Two times a day (BID) | ORAL | Status: DC
  Administered 2023-12-20 – 2023-12-24 (×8): 50 mg via ORAL
  Filled 2023-12-20 (×8): qty 1

## 2023-12-20 MED ORDER — PROPOFOL 10 MG/ML IV BOLUS
INTRAVENOUS | Status: AC
  Filled 2023-12-20: qty 20

## 2023-12-20 MED ORDER — LEVOTHYROXINE SODIUM 25 MCG PO TABS: 25.0000 ug | ORAL_TABLET | Freq: Every day | ORAL | Status: DC

## 2023-12-20 MED ORDER — CHLORHEXIDINE GLUCONATE CLOTH 2 % EX PADS: 6.0000 | MEDICATED_PAD | Freq: Once | CUTANEOUS | Status: DC

## 2023-12-20 MED ORDER — KETAMINE HCL 50 MG/5ML IJ SOSY
PREFILLED_SYRINGE | INTRAMUSCULAR | Status: AC
  Filled 2023-12-20: qty 5

## 2023-12-20 MED ORDER — 0.9 % SODIUM CHLORIDE (POUR BTL) OPTIME
TOPICAL | Status: DC | PRN
  Administered 2023-12-20: 2000 mL

## 2023-12-20 MED ORDER — VANCOMYCIN HCL IN DEXTROSE 1-5 GM/200ML-% IV SOLN
1000.0000 mg | Freq: Once | INTRAVENOUS | Status: AC
  Administered 2023-12-20: 1000 mg via INTRAVENOUS
  Filled 2023-12-20: qty 200

## 2023-12-20 MED ORDER — SODIUM CHLORIDE 0.9% IV SOLUTION: Freq: Once | INTRAVENOUS | Status: DC

## 2023-12-20 MED ORDER — SUGAMMADEX SODIUM 200 MG/2ML IV SOLN
INTRAVENOUS | Status: AC
  Filled 2023-12-20: qty 2

## 2023-12-20 MED ORDER — DEXMEDETOMIDINE HCL IN NACL 400 MCG/100ML IV SOLN: 0.0000 ug/kg/h | INTRAVENOUS | Status: DC

## 2023-12-20 MED ORDER — TRAMADOL HCL 50 MG PO TABS
50.0000 mg | ORAL_TABLET | ORAL | Status: DC | PRN
  Administered 2023-12-20: 100 mg via ORAL
  Administered 2023-12-22: 50 mg via ORAL
  Filled 2023-12-20: qty 1
  Filled 2023-12-20: qty 2

## 2023-12-20 MED ORDER — VASOPRESSIN 20 UNIT/ML IV SOLN
INTRAVENOUS | Status: DC | PRN
  Administered 2023-12-20: 1 [IU] via INTRAVENOUS
  Administered 2023-12-20: .5 [IU] via INTRAVENOUS
  Administered 2023-12-20 (×2): 1 [IU] via INTRAVENOUS

## 2023-12-20 MED ORDER — VASOPRESSIN 20 UNIT/ML IV SOLN
INTRAVENOUS | Status: AC
  Filled 2023-12-20: qty 1

## 2023-12-20 MED ORDER — EPHEDRINE 5 MG/ML INJ
INTRAVENOUS | Status: AC
  Filled 2023-12-20: qty 5

## 2023-12-20 MED ORDER — SODIUM CHLORIDE 0.9 % IV SOLN: 250.0000 mL | INTRAVENOUS | Status: AC

## 2023-12-20 MED ORDER — NOREPINEPHRINE 4 MG/250ML-% IV SOLN
INTRAVENOUS | Status: DC | PRN
  Administered 2023-12-20: 4 ug/min via INTRAVENOUS

## 2023-12-20 SURGICAL SUPPLY — 51 items
BAG DECANTER FOR FLEXI CONT (MISCELLANEOUS) ×4 IMPLANT
BLADE CLIPPER SURG (BLADE) ×4 IMPLANT
BLADE CORE FAN STRYKER (BLADE) IMPLANT
BLADE STERNUM SYSTEM 6 (BLADE) ×4 IMPLANT
BNDG GAUZE DERMACEA FLUFF 4 (GAUZE/BANDAGES/DRESSINGS) ×4 IMPLANT
CANISTER SUCT 3000ML PPV (MISCELLANEOUS) ×4 IMPLANT
CLIP TI MEDIUM 24 (CLIP) IMPLANT
CNTNR URN SCR LID CUP LEK RST (MISCELLANEOUS) ×4 IMPLANT
CONNECTOR BLAKE 2:1 CARIO BLK (MISCELLANEOUS) IMPLANT
DRAIN CHANNEL 19F RND (DRAIN) IMPLANT
DRAIN CHANNEL 24FR (DRAIN) IMPLANT
DRAIN CHANNEL 28F RND 3/8 FF (WOUND CARE) IMPLANT
DRAIN CHANNEL X4 JP 24F (MISCELLANEOUS) IMPLANT
DRAPE CHEST BREAST 15X10 FENES (DRAPES) IMPLANT
DRAPE INCISE IOBAN 66X45 STRL (DRAPES) ×4 IMPLANT
DRSG AQUACEL AG ADV 3.5X10 (GAUZE/BANDAGES/DRESSINGS) IMPLANT
ELECT BLADE 4.0 EZ CLEAN MEGAD (MISCELLANEOUS) ×3 IMPLANT
ELECT CAUTERY BLADE 6.4 (BLADE) ×4 IMPLANT
ELECT REM PT RETURN 9FT ADLT (ELECTROSURGICAL) ×6 IMPLANT
ELECTRODE BLDE 4.0 EZ CLN MEGD (MISCELLANEOUS) ×4 IMPLANT
ELECTRODE REM PT RTRN 9FT ADLT (ELECTROSURGICAL) ×8 IMPLANT
FELT TEFLON 1X6 (MISCELLANEOUS) ×4 IMPLANT
GAUZE SPONGE 4X4 12PLY STRL (GAUZE/BANDAGES/DRESSINGS) ×8 IMPLANT
GAUZE SPONGE 4X4 12PLY STRL LF (GAUZE/BANDAGES/DRESSINGS) IMPLANT
GLOVE ECLIPSE 7.5 STRL STRAW (GLOVE) ×8 IMPLANT
GOWN STRL REUS W/ TWL LRG LVL3 (GOWN DISPOSABLE) ×16 IMPLANT
GOWN STRL REUS W/ TWL XL LVL3 (GOWN DISPOSABLE) IMPLANT
KIT BASIN OR (CUSTOM PROCEDURE TRAY) ×4 IMPLANT
KIT TURNOVER KIT B (KITS) ×4 IMPLANT
NS IRRIG 1000ML POUR BTL (IV SOLUTION) ×12 IMPLANT
PACK CHEST (CUSTOM PROCEDURE TRAY) ×4 IMPLANT
PAD ARMBOARD 7.5X6 YLW CONV (MISCELLANEOUS) ×8 IMPLANT
PAD ELECT DEFIB RADIOL ZOLL (MISCELLANEOUS) ×4 IMPLANT
PENCIL BUTTON HOLSTER BLD 10FT (ELECTRODE) ×4 IMPLANT
POSITIONER HEAD DONUT 9IN (MISCELLANEOUS) ×4 IMPLANT
SUPPORT HEART JANKE-BARRON (MISCELLANEOUS) ×4 IMPLANT
SUT MNCRL AB 4-0 PS2 18 (SUTURE) ×8 IMPLANT
SUT PROLENE 2 0 MH 48 (SUTURE) IMPLANT
SUT PROLENE 4 0 SH DA (SUTURE) IMPLANT
SUT STEEL SZ 6 DBL 3X14 BALL (SUTURE) IMPLANT
SUT VIC AB 0 CTX36XBRD ANTBCTR (SUTURE) ×8 IMPLANT
SUT VIC AB 2-0 CT1 TAPERPNT 27 (SUTURE) ×8 IMPLANT
SYSTEM SAHARA CHEST DRAIN ATS (WOUND CARE) ×4 IMPLANT
TAPE PAPER 2X10 WHT MICROPORE (GAUZE/BANDAGES/DRESSINGS) IMPLANT
TAPE PAPER 3X10 WHT MICROPORE (GAUZE/BANDAGES/DRESSINGS) IMPLANT
TOWEL GREEN STERILE (TOWEL DISPOSABLE) ×4 IMPLANT
TOWEL GREEN STERILE FF (TOWEL DISPOSABLE) ×4 IMPLANT
TRAP SPECIMEN MUCUS 40CC (MISCELLANEOUS) IMPLANT
TRAY FOLEY SLVR 16FR TEMP STAT (SET/KITS/TRAYS/PACK) ×4 IMPLANT
TUBE CONNECTING 20X1/4 (TUBING) IMPLANT
WATER STERILE IRR 1000ML POUR (IV SOLUTION) ×8 IMPLANT

## 2023-12-20 NOTE — Plan of Care (Signed)
  Problem: Fluid Volume: Goal: Ability to maintain a balanced intake and output will improve Outcome: Progressing   Problem: Metabolic: Goal: Ability to maintain appropriate glucose levels will improve Outcome: Progressing   Problem: Clinical Measurements: Goal: Ability to maintain clinical measurements within normal limits will improve Outcome: Progressing Goal: Cardiovascular complication will be avoided Outcome: Progressing   Problem: Pain Managment: Goal: General experience of comfort will improve and/or be controlled Outcome: Progressing   Problem: Education: Goal: Knowledge of disease or condition will improve Outcome: Progressing   Problem: Activity: Goal: Ability to tolerate increased activity will improve Outcome: Progressing   Problem: Cardiac: Goal: Ability to achieve and maintain adequate cardiopulmonary perfusion will improve Outcome: Progressing   Problem: Health Behavior/Discharge Planning: Goal: Ability to safely manage health-related needs after discharge will improve Outcome: Progressing

## 2023-12-20 NOTE — Discharge Summary (Signed)
 [address]       [place] [address]             [phone]    Physician Discharge Summary  Patient ID: [name] MRN: [id number] DOB/AGE: January 23, 1946 78 y.o.  Admit date: 12/19/2023 Discharge date: 12/24/2023  Admission Diagnoses: Large pericardial effusion status post recent AVR/CABG  Patient Active Problem List   Diagnosis Date Noted   Pericardial effusion 12/19/2023   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   Aortic stenosis 07/05/2023   CAD (coronary artery disease) 07/05/2023   Chest pressure 05/27/2023   Urinary hesitancy 03/22/2023   Hyperkalemia 03/22/2023   Hypothyroid 03/08/2021   History of colon polyps 03/08/2021   Alcohol intake above recommended sensible limits 05/13/2019   Hyperlipidemia 02/25/2019   Colon cancer screening 02/12/2017   Routine general medical examination at a health care facility 01/20/2016   Heart murmur, systolic 01/13/2014   Elevated PSA 01/13/2014   Encounter for Medicare annual wellness exam 09/24/2011   Prostate cancer screening 09/24/2011   Osteoarthritis, knee 02/14/2011   Tinnitus 12/09/2009   BACK PAIN, LUMBAR, WITH RADICULOPATHY 10/10/2009   Essential hypertension, benign 08/16/2009   Thrombocytopenia (HCC) 02/15/2009   Prediabetes 01/07/2008     Discharge Diagnoses:  Patient Active Problem List   Diagnosis Date Noted   Pericardial effusion 12/19/2023   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   Aortic stenosis 07/05/2023   CAD (coronary artery disease) 07/05/2023   Chest pressure 05/27/2023   Urinary hesitancy 03/22/2023   Hyperkalemia 03/22/2023   Hypothyroid 03/08/2021   History of colon polyps 03/08/2021   Alcohol intake above recommended sensible limits 05/13/2019   Hyperlipidemia 02/25/2019   Colon cancer screening 02/12/2017   Routine general medical examination at a health care facility 01/20/2016   Heart murmur, systolic 01/13/2014   Elevated PSA 01/13/2014    Encounter for Medicare annual wellness exam 09/24/2011   Prostate cancer screening 09/24/2011   Osteoarthritis, knee 02/14/2011   Tinnitus 12/09/2009   BACK PAIN, LUMBAR, WITH RADICULOPATHY 10/10/2009   Essential hypertension, benign 08/16/2009   Thrombocytopenia (HCC) 02/15/2009   Prediabetes 01/07/2008     Discharged Condition: good   History of Presenting Illness: Mr. Luis Berns. Rogers is a 78 year old gentleman with a past medical history notable for coronary artery disease, severe aortic stenosis, hypertension, dyslipidemia, and prediabetes.  He was recently referred to [place] for management of his coronary artery disease and aortic stenosis.  He underwent CABG x 2 and bioprosthetic AVR with a 23 mm Inspiris bioprosthetic valve by Dr. Leafy Ro.  [facility] on 12/06/2023.  His postoperative course was notable for atrial fibrillation managed with amiodarone and conversion back to sinus rhythm by the time of his discharge on postop day 6.  He also had acute kidney injury with a peak creatinine of 2.15 on postop day 2.  His renal function gradually improved after that.  He had good response to diuresis after surgery and was discharged home and weight about 3 kg below his preoperative weight.  He was continued on Lasix for 5 days, apixaban, amiodarone, and metoprolol 50 mg twice daily.  He said he felt he was gradually improving until about 3 days ago when he noticed he began getting aggressively short of breath with even minimal activity.  He notes that was about the same time that he stopped taking the Lasix.  He denies having any chest pain, dizziness, or palpitations.  He is also not  aware of having any fever.  Appetite has been poor and he is only had couple of small hard bowel movements since his discharge a week ago.   Mr. Burdi was brought to the emergency room by EMS earlier today for evaluation of progressive shortness of breath.  Dr. Andria Meuse, ED physician, contacted [place] after he  performed a bedside echo showing a large pericardial effusion without tamponade physiology.  A formal echocardiogram has confirmed this and he was admitted for planned pericardial window.  Hospital course: The patient remained medically stable overnight and on 12/20/2023 he was taken to the operating room for attempted pericardial window.  Initial incision was at the lower pole of the sternotomy incision but ultimately did required full reopening of the sternotomy.  The fluid was removed as was bilateral significant pleural effusions.  The patient tolerated the procedure well and was taken to the surgical intensive care unit in stable condition. He remained hemodynamically stable.  He also remained stable sinus rhythm.  He was followed by the critical care medicine team after surgery while in the ICU.  The mediastinal tubes were removed on postop day 2 but the pleural tubes were left in place for continued drainage. He had acute renal insufficiency related to hemodynamic compromise before the drainage procedure and his renal function worsened in the early postoperative recovery.  Diuretics were held until there was evidence of renal recovery. Pleural tubes were removed on POD3 and he was felt stable for transfer to the progressive unit.  He continued to do well in his postoperative rehabilitation and on postop day 4 he was felt to be quite stable for discharge.  It is noted that his creatinine is continuing to trend lower but remains elevated above baseline.  We will obtain a repeat creatinine at his office visit next week.  Depending on results he may require nephrology consultation.  Consults: None  Significant Diagnostic Studies:  DG Chest Port 1 View Result Date: 12/21/2023 CLINICAL DATA:  Pericardial effusion. EXAM: PORTABLE CHEST 1 VIEW COMPARISON:  12/20/2023 FINDINGS: Again noted is a lateral right chest tube and 3 chest drains in the midline and left lower chest region. No definite pneumothorax.  Increased linear densities in the left lower lung are suggestive for atelectasis. Postsurgical changes compatible with aortic valve replacement. Heart size is upper limits of normal but stable. Atherosclerotic calcifications at the aortic arch. IMPRESSION: 1. Stable chest tubes without pneumothorax. 2. Increased linear densities in the left lower lung are suggestive for atelectasis. Electronically Signed   By: Richarda Overlie M.D.   On: 12/21/2023 10:38   DG Chest Port 1 View Result Date: 12/20/2023 CLINICAL DATA:  Pericardial effusion. Status post subxiphoid pericardial window today. EXAM: PORTABLE CHEST 1 VIEW COMPARISON:  Nuclear medicine PET-CT with cardiac perfusion. Chest radiographs dated 12/19/2023. FINDINGS: Enlarged cardiac silhouette with an interval decrease in size. Tortuous and partially calcified thoracic aorta. Stable prosthetic aortic valve and post CABG changes. Interval mediastinal/pericardial drainage tubes. Right chest tube. No pneumothorax mild left basilar airspace opacity with significant improvement mild scoliosis and moderate thoracic spine degenerative changes. IMPRESSION: 1. Interval decrease in size of the cardiac silhouette. 2. Interval mediastinal/pericardial drainage tubes and right chest tube without pneumothorax. 3. Significant improvement in left basilar atelectasis. Electronically Signed   By: Beckie Salts M.D.   On: 12/20/2023 15:15   DG Chest 2 View Result Date: 12/19/2023 CLINICAL DATA:  Pericardial effusion. EXAM: CHEST - 2 VIEW COMPARISON:  Chest radiograph dated 12/11/2023. FINDINGS: There is  cardiomegaly with vascular congestion. Small bilateral pleural effusions, left greater than right. Left lung base atelectasis or infiltrate. No pneumothorax. Atherosclerotic calcification of the aorta. Median sternotomy wires. No acute osseous pathology. IMPRESSION: 1. Cardiomegaly with vascular congestion and small bilateral pleural effusions. 2. Left lung base atelectasis or  infiltrate. Electronically Signed   By: Elgie Collard M.D.   On: 12/19/2023 14:40   ECHOCARDIOGRAM COMPLETE Result Date: 12/19/2023    ECHOCARDIOGRAM REPORT   Patient Name:   Issac Moure Date of Exam: 12/19/2023 Medical Rec #:  [id number]              Height:       68.0 in Accession #:    [id number]             Weight:       214.0 lb Date of Birth:  08/02/1946             BSA:          2.103 m Patient Age:    77 years               BP:           105/78 mmHg Patient Gender: M                      HR:           75 bpm. Exam Location:  Inpatient Procedure: 2D Echo, Color Doppler and Cardiac Doppler (Both Spectral and Color            Flow Doppler were utilized during procedure). STAT ECHO Indications:    Pericardial Effusion  History:        Patient has prior history of Echocardiogram examinations, most                 recent 10/25/2023. CAD, Aortic Valve Disease; Risk                 Factors:Dyslipidemia and Hypothyrdoism.                 Aortic Valve: 23 mm Inspiris valve is present in the aortic                 position. Procedure Date: 12/06/2023.  Sonographer:    Raeford Razor RDCS Referring Phys: Eugenio Hoes  Sonographer Comments: Image acquisition challenging due to patient body habitus. IMPRESSIONS  1. Large pericardial effusion. The pericardial effusion is primarily posterior to the left ventricle and surrounding the apex, small component anterior to RV. There is evidence of constrictive physiology with respiratory related septal shift and IVC dilation with reduced inspiratory collapse. There is no RV diastolic collapse. Respirometer study suboptimal. Findings overall effusive constrictive physiology. BP 105/78, HR 75 bpm.  2. Left ventricular ejection fraction, by estimation, is 60 to 65%. The left ventricle has normal function. The left ventricle has no regional wall motion abnormalities. There is mild left ventricular hypertrophy. Left ventricular diastolic parameters are indeterminate.  3.  Right ventricular systolic function is mildly reduced. The right ventricular size is normal. Tricuspid regurgitation signal is inadequate for assessing PA pressure.  4. Left atrial size was mildly dilated.  5. Right atrial size was mildly dilated.  6. The mitral valve is degenerative. Trivial mitral valve regurgitation. No evidence of mitral stenosis.  7. The aortic valve has been repaired/replaced. Aortic valve regurgitation is not visualized. There is a 23 mm Inspiris valve present in the aortic position. Procedure Date:  12/06/2023. Echo findings are consistent with normal structure and function of the aortic valve prosthesis. Aortic valve area, by VTI measures 1.95 cm. Aortic valve mean gradient measures 8.0 mmHg. Aortic valve Vmax measures 1.96 m/s. Aortic valve acceleration time measures 61 msec.  8. The inferior vena cava is dilated in size with <50% respiratory variability, suggesting right atrial pressure of 15 mmHg. FINDINGS  Left Ventricle: Left ventricular ejection fraction, by estimation, is 60 to 65%. The left ventricle has normal function. The left ventricle has no regional wall motion abnormalities. The left ventricular internal cavity size was normal in size. There is  mild left ventricular hypertrophy. Abnormal (paradoxical) septal motion consistent with post-operative status. Left ventricular diastolic parameters are indeterminate. Right Ventricle: The right ventricular size is normal. No increase in right ventricular wall thickness. Right ventricular systolic function is mildly reduced. Tricuspid regurgitation signal is inadequate for assessing PA pressure. Left Atrium: Left atrial size was mildly dilated. Right Atrium: Right atrial size was mildly dilated. Pericardium: A large pericardial effusion is present. The pericardial effusion is posterior to the left ventricle and surrounding the apex. Mitral Valve: The mitral valve is degenerative in appearance. Mild to moderate mitral annular  calcification. Trivial mitral valve regurgitation. No evidence of mitral valve stenosis. Tricuspid Valve: The tricuspid valve is normal in structure. Tricuspid valve regurgitation is mild . No evidence of tricuspid stenosis. Aortic Valve: The aortic valve has been repaired/replaced. Aortic valve regurgitation is not visualized. Aortic valve mean gradient measures 8.0 mmHg. Aortic valve peak gradient measures 15.4 mmHg. Aortic valve area, by VTI measures 1.95 cm. There is a 23 mm Inspiris valve present in the aortic position. Procedure Date: 12/06/2023. Echo findings are consistent with normal structure and function of the aortic valve prosthesis. Pulmonic Valve: The pulmonic valve was normal in structure. Pulmonic valve regurgitation is trivial. No evidence of pulmonic stenosis. Aorta: The aortic root is normal in size and structure. Venous: The inferior vena cava is dilated in size with less than 50% respiratory variability, suggesting right atrial pressure of 15 mmHg. IAS/Shunts: No atrial level shunt detected by color flow Doppler.  LEFT VENTRICLE PLAX 2D LVIDd:         5.20 cm      Diastology LVIDs:         3.50 cm      LV e' medial:    7.07 cm/s LV PW:         1.10 cm      LV E/e' medial:  16.7 LV IVS:        1.10 cm      LV e' lateral:   8.70 cm/s LVOT diam:     2.30 cm      LV E/e' lateral: 13.6 LV SV:         60 LV SV Index:   29 LVOT Area:     4.15 cm  LV Volumes (MOD) LV vol d, MOD A2C: 110.0 ml LV vol d, MOD A4C: 113.0 ml LV vol s, MOD A2C: 47.3 ml LV vol s, MOD A4C: 51.3 ml LV SV MOD A2C:     62.7 ml LV SV MOD A4C:     113.0 ml LV SV MOD BP:      60.5 ml RIGHT VENTRICLE            IVC RV Basal diam:  3.10 cm    IVC diam: 2.60 cm RV Mid diam:    2.00 cm RV S prime:  6.64 cm/s TAPSE (M-mode): 1.0 cm LEFT ATRIUM             Index        RIGHT ATRIUM           Index LA diam:        4.00 cm 1.90 cm/m   RA Area:     26.30 cm LA Vol (A2C):   99.9 ml 47.50 ml/m  RA Volume:   80.40 ml  38.22 ml/m LA Vol  (A4C):   74.0 ml 35.18 ml/m LA Biplane Vol: 89.3 ml 42.46 ml/m  AORTIC VALVE AV Area (Vmax):    2.09 cm AV Area (Vmean):   2.03 cm AV Area (VTI):     1.95 cm AV Vmax:           196.08 cm/s AV Vmean:          128.431 cm/s AV VTI:            0.308 m AV Peak Grad:      15.4 mmHg AV Mean Grad:      8.0 mmHg LVOT Vmax:         98.40 cm/s LVOT Vmean:        62.880 cm/s LVOT VTI:          0.145 m LVOT/AV VTI ratio: 0.47  AORTA Ao Root diam: 3.20 cm Ao Asc diam:  3.50 cm MITRAL VALVE MV Area (PHT): 4.10 cm     SHUNTS MV Decel Time: 185 msec     Systemic VTI:  0.14 m MV E velocity: 118.00 cm/s  Systemic Diam: 2.30 cm MV A velocity: 44.70 cm/s MV E/A ratio:  2.64 Weston Brass MD Electronically signed by Weston Brass MD Signature Date/Time: 12/19/2023/2:25:46 PM    Final    DG CHEST PORT 1 VIEW Result Date: 12/11/2023 CLINICAL DATA:  Status post aortic valve repair EXAM: PORTABLE CHEST 1 VIEW COMPARISON:  12/09/2023 FINDINGS: Cardiac shadow is enlarged but stable. Aortic valve replacement is again seen. Left-sided chest tube has been removed in the interval. No significant pneumothorax is noted. Small effusions are noted bilaterally. IMPRESSION: No pneumothorax following chest tube removal. Electronically Signed   By: Alcide Clever M.D.   On: 12/11/2023 09:51   DG CHEST PORT 1 VIEW Result Date: 12/09/2023 CLINICAL DATA:  Status post aortic valve replacement. EXAM: PORTABLE CHEST 1 VIEW COMPARISON:  Radiograph yesterday FINDINGS: Median sternotomy. Persistent low lung volumes. Left-sided chest tube and mediastinal drains remain in place. Right internal jugular sheath remains in place. Stable heart size and mediastinal contours, prosthetic aortic valve. Bibasilar volume loss and opacity, favoring atelectasis and effusions, improvement at the right lung base from prior exam. There is a small left apical pneumothorax, visualized under the posterior second rib. IMPRESSION: 1. Small left apical pneumothorax.  Left-sided chest tube in place. 2. Bibasilar volume loss and opacity, favoring atelectasis and effusions, improvement on the right since yesterday. Electronically Signed   By: Narda Rutherford M.D.   On: 12/09/2023 10:06   DG CHEST PORT 1 VIEW Result Date: 12/08/2023 CLINICAL DATA:  Postop from aortic valve replacement. EXAM: PORTABLE CHEST 1 VIEW COMPARISON:  12/07/2023 FINDINGS: Swan-Ganz catheter has been removed. Left chest tube and mediastinal drains remain in place. No pneumothorax visualized. Bibasilar atelectasis is again seen, mildly increased on the right since previous study. Probable small bilateral pleural effusions. IMPRESSION: Bibasilar atelectasis, mildly increased on the right. Probable small bilateral pleural effusions. No pneumothorax visualized. Electronically Signed   By:  Danae Orleans M.D.   On: 12/08/2023 09:30   DG Chest Port 1 View Result Date: 12/07/2023 CLINICAL DATA:  Postop from CABG and aortic valve replacement. EXAM: PORTABLE CHEST 1 VIEW COMPARISON:  12/06/2023 FINDINGS: Endotracheal tube and enteric tube have been removed since previous study. Swan-Ganz catheter and left chest tube and mediastinal drains remain in place. No pneumothorax visualized. Mild increase in atelectasis or infiltrate is seen in the left retrocardiac lung base. Mild right basilar atelectasis shows no significant change. IMPRESSION: Mild increase in left basilar atelectasis or infiltrate. Stable mild right basilar atelectasis. Electronically Signed   By: Danae Orleans M.D.   On: 12/07/2023 09:30   ECHO INTRAOPERATIVE TEE Result Date: 12/07/2023  *INTRAOPERATIVE TRANSESOPHAGEAL REPORT *  Patient Name:   Michae Liles Date of Exam: 12/06/2023 Medical Rec #:  [id number]        Height:       68.0 in Accession #:    [id number]       Weight:       223.0 lb Date of Birth:  10-Jan-1946       BSA:          2.14 m Patient Age:    77 years         BP:           120/68 mmHg Patient Gender: M                HR:            78 bpm. Exam Location:  Anesthesiology Transesophogeal exam was perform intraoperatively during surgical procedure. Patient was closely monitored under general anesthesia during the entirety of examination. Indications:     I35.0 Nonrheumatic aortic (valve) stenosis; I25.110                  Atherosclerotic heart disease of native coronary artery with                  unstable angina pectoris Sonographer:     Sheralyn Boatman RDCS Performing Phys: [id number] Eugenio Hoes Diagnosing Phys: Achille Rich MD Complications: No known complications during this procedure. POST-OP IMPRESSIONS _ Left Ventricle: The left ventricle is unchanged from pre-bypass. _ Aortic Valve: A bioprosthetic valve was placed, leaflets are freely mobile and thin. Manufactured by; inspiris Size; 23mm. Mean PG 8 mmHg across the new AV. _ Mitral Valve: The mitral valve appears unchanged from pre-bypass. PRE-OP FINDINGS  Left Ventricle: The left ventricle has normal systolic function, with an ejection fraction of 60-65%. The cavity size was normal. There is moderately increased left ventricular wall thickness. There is moderate concentric left ventricular hypertrophy. Right Ventricle: The right ventricle has normal systolic function. The cavity was normal. There is no increase in right ventricular wall thickness. Left Atrium: Left atrial size was normal in size. No left atrial/left atrial appendage thrombus was detected. Right Atrium: Right atrial size was normal in size. Interatrial Septum: Evidence of atrial level shunting detected by color flow Doppler. Agitated saline contrast was given intravenously to evaluate for intracardiac shunting. There is redundancy of the interatrial septum. Pericardium: Trivial pericardial effusion is present. The pericardial effusion is localized near the right ventricle. Mitral Valve: The mitral valve is normal in structure. Mitral valve regurgitation is mild by color flow Doppler. There is No evidence of mitral stenosis.  Tricuspid Valve: The tricuspid valve was normal in structure. Tricuspid valve regurgitation is mild by color flow Doppler. Aortic Valve: The aortic valve is tricuspid Aortic  valve regurgitation is mild by color flow Doppler. The jet is centrally-directed. There is severe stenosis of the aortic valve. Pulmonic Valve: The pulmonic valve was normal in structure, with normal. Pulmonic valve regurgitation is trivial by color flow Doppler. Aorta: The aortic root, ascending aorta and aortic arch are normal in size and structure. +-------------+---------++ AORTIC VALVE           +-------------+---------++ AV Mean Grad:39.0 mmHg +-------------+---------++  Achille Rich MD Electronically signed by Achille Rich MD Signature Date/Time: 12/07/2023/7:36:11 AM    Final    DG Chest Port 1 View Result Date: 12/06/2023 CLINICAL DATA:  Status post aortic valve replacement EXAM: PORTABLE CHEST 1 VIEW COMPARISON:  12/03/2022 FINDINGS: Interval median sternotomy for aortic valve repair. Endotracheal tube terminates 2.3 cm above carina. Right IJ Swan-Ganz catheter tip in central right pulmonary artery. Nasogastric tube terminates at the body of the stomach. Mediastinal drain and left chest tubes. Cardiomegaly accentuated by AP portable technique. No pleural effusion or pneumothorax. Mild pulmonary venous congestion. Low lung volumes with left-greater-than-right lower lung atelectasis. IMPRESSION: Appropriate position of support apparatus, without pneumothorax or other acute complication. Cardiomegaly with pulmonary venous congestion and left-greater-than-right lower lung atelectasis. Electronically Signed   By: Jeronimo Greaves M.D.   On: 12/06/2023 15:06   DG Chest 2 View Result Date: 12/04/2023 CLINICAL DATA:  Preop for open heart surgery. EXAM: CHEST - 2 VIEW COMPARISON:  CT 07/09/2023 FINDINGS: Heart size upper normal. The cardiomediastinal contours are normal. Aortic atherosclerosis. Pulmonary vasculature is normal. No  consolidation, pleural effusion, or pneumothorax. No acute osseous abnormalities are seen. IMPRESSION: No active cardiopulmonary disease. Electronically Signed   By: Narda Rutherford M.D.   On: 12/04/2023 19:05   VAS US DOPPLER PRE CABG Result Date: 12/04/2023 PREOPERATIVE VASCULAR EVALUATION Patient Name:  Mohamad Doolittle  Date of Exam:   12/04/2023 Medical Rec #: [id number]         Accession #:    [id number] Date of Birth: 03-Jun-1946        Patient Gender: M Patient Age:   89 years Exam Location:  [facility] Procedure:      VAS US DOPPLER PRE CABG Referring Phys: Eugenio Hoes --------------------------------------------------------------------------------  Indications:      Pre-AVR. Risk Factors:     Hypertension, coronary artery disease. Comparison Study: No prior studies. Performing Technologist: Olen Cordial RVT  Examination Guidelines: A complete evaluation includes B-mode imaging, spectral Doppler, color Doppler, and power Doppler as needed of all accessible portions of each vessel. Bilateral testing is considered an integral part of a complete examination. Limited examinations for reoccurring indications may be performed as noted.  Right Carotid Findings: +----------+--------+--------+--------+--------------------------+--------+           PSV cm/sEDV cm/sStenosisDescribe                  Comments +----------+--------+--------+--------+--------------------------+--------+ CCA Prox  76      18                                        tortuous +----------+--------+--------+--------+--------------------------+--------+ CCA Distal85      15                                                 +----------+--------+--------+--------+--------------------------+--------+ ICA Prox  66  22              irregular and heterogenous         +----------+--------+--------+--------+--------------------------+--------+ ICA Mid   56      19                                                  +----------+--------+--------+--------+--------------------------+--------+ ICA Distal69      22                                        tortuous +----------+--------+--------+--------+--------------------------+--------+ ECA       138     21                                                 +----------+--------+--------+--------+--------------------------+--------+ +----------+--------+-------+--------+------------+           PSV cm/sEDV cmsDescribeArm Pressure +----------+--------+-------+--------+------------+ Subclavian131                                 +----------+--------+-------+--------+------------+ +---------+--------+--+--------+--+---------+ VertebralPSV cm/s63EDV cm/s16Antegrade +---------+--------+--+--------+--+---------+ Left Carotid Findings: +----------+--------+--------+--------+-----------------------+--------+           PSV cm/sEDV cm/sStenosisDescribe               Comments +----------+--------+--------+--------+-----------------------+--------+ CCA Prox  110     27              smooth and heterogenous         +----------+--------+--------+--------+-----------------------+--------+ CCA Distal83      22              smooth and heterogenous         +----------+--------+--------+--------+-----------------------+--------+ ICA Prox  69      22              smooth and heterogenoustortuous +----------+--------+--------+--------+-----------------------+--------+ ICA Mid   69      28                                              +----------+--------+--------+--------+-----------------------+--------+ ICA Distal121     29                                     tortuous +----------+--------+--------+--------+-----------------------+--------+ ECA       105     26                                              +----------+--------+--------+--------+-----------------------+--------+  +----------+--------+--------+--------+------------+ SubclavianPSV cm/sEDV cm/sDescribeArm Pressure +----------+--------+--------+--------+------------+           121                                  +----------+--------+--------+--------+------------+ +---------+--------+--+--------+-+---------+ VertebralPSV cm/s33EDV cm/s9Antegrade +---------+--------+--+--------+-+---------+  ABI  Findings: +------------------+---------+ Rt Pressure (mmHg)Waveform  +------------------+---------+ 153               triphasic +------------------+---------+ +------------------+---------+ Lt Pressure (mmHg)Waveform  +------------------+---------+ 150               triphasic +------------------+---------+  Right Doppler Findings: +--------+--------+---------+ Site    PressureDoppler   +--------+--------+---------+ [id number]     triphasic +--------+--------+---------+ Radial          triphasic +--------+--------+---------+ Ulnar           triphasic +--------+--------+---------+  Left Doppler Findings: +--------+--------+---------+ Site    PressureDoppler   +--------+--------+---------+ [id number]     triphasic +--------+--------+---------+ Radial          triphasic +--------+--------+---------+ Ulnar           triphasic +--------+--------+---------+   Summary: Right Carotid: Velocities in the right ICA are consistent with a 1-39% stenosis. Left Carotid: Velocities in the left ICA are consistent with a 1-39% stenosis. Vertebrals: Bilateral vertebral arteries demonstrate antegrade flow. Right Upper Extremity: Doppler waveforms remain within normal limits with right radial compression. Doppler waveforms decrease >50% with right ulnar compression. Left Upper Extremity: Doppler waveform obliterate with left radial compression. Doppler waveforms decrease >50% with left ulnar compression.   Electronically signed by Coral Else MD on 12/04/2023 at 5:53:54 PM.    Final    NM PET  CT CARDIAC PERFUSION MULTI W/ABSOLUTE BLOODFLOW Addendum Date: 11/26/2023   Findings are consistent with no ischemia and no infarction on perfusion images. The study is intermediate risk given presence of transient ischemic dilation, and no augmentation of EF with stress. Reduced MBFR may be secondary to severe aortic valve stenosis contributing to microvascular dysfunction.   Myocardial blood flow was computed to be 1.8ml/g/min at rest and 1.20ml/g/min at stress. Global myocardial blood flow reserve was 1.10. Myocardial blood flow reserve is indeterminate in this patient due to technical or patient-specific concerns that affect accuracy: severe aortic valve stenosis.   LV perfusion is normal. There is no evidence of ischemia. There is no evidence of infarction.   Rest left ventricular function is normal. Rest EF: 57%. Stress left ventricular function is normal. Stress EF: 53%. End diastolic cavity size is moderately enlarged. End systolic cavity size is mildly enlarged. Evidence of transient ischemic dilation (TID) noted.   Coronary calcium was present on the attenuation correction CT images. Severe coronary calcifications were present. Coronary calcifications were present in the left anterior descending artery, left circumflex artery and right coronary artery distribution(s).   Electronically signed by: Parke Poisson, MD CLINICAL DATA:  This over-read does not include interpretation of cardiac or coronary anatomy or pathology. The Cardiac PET CT interpretation by the cardiologist is attached. COMPARISON:  CT angio chest 07/09/2023 FINDINGS: Vascular: No acute abnormality. Mediastinum/Nodes: No mass or adenopathy identified within the imaged portions of the mediastinum. Lungs/Pleura: No pleural effusion, airspace consolidation or pneumothorax. No suspicious pulmonary nodule or mass identified. Upper Abdomen: No acute abnormality within the imaged portions of the upper abdomen. Hepatic steatosis.  Musculoskeletal: No acute or suspicious osseous findings. IMPRESSION: 1. No significant noncardiac supplemental findings identified within the chest. 2. Hepatic steatosis. Electronically Signed   By: Signa Kell M.D.   On: 11/26/2023 13:53   Result Date: 11/26/2023   Findings are consistent with no ischemia and no infarction on perfusion images. The study is intermediate risk given abnormal MBFR, presence of transient ischemic dilation, and no augmentation of EF with stress. Reduced MBFR may be secondary to severe  aortic valve stenosis contributing to microvascular dysfunction, or possibly mulitvessel CAD.   Myocardial blood flow was computed to be 1.63ml/g/min at rest and 1.73ml/g/min at stress. Global myocardial blood flow reserve was 1.10. Myocardial blood flow reserve is indeterminate and therefore is not reported in this patient due to technical or patient-specific concerns that affect accuracy.   LV perfusion is normal. There is no evidence of ischemia. There is no evidence of infarction.   Rest left ventricular function is normal. Rest EF: 57%. Stress left ventricular function is normal. Stress EF: 53%. End diastolic cavity size is moderately enlarged. End systolic cavity size is mildly enlarged. Evidence of transient ischemic dilation (TID) noted.   Coronary calcium was present on the attenuation correction CT images. Severe coronary calcifications were present. Coronary calcifications were present in the left anterior descending artery, left circumflex artery and right coronary artery distribution(s).   Electronically signed by: Parke Poisson, MD CLINICAL DATA:  This over-read does not include interpretation of cardiac or coronary anatomy or pathology. The Cardiac PET CT interpretation by the cardiologist is attached. COMPARISON:  CT angio chest 07/09/2023 FINDINGS: Vascular: No acute abnormality. Mediastinum/Nodes: No mass or adenopathy identified within the imaged portions of the mediastinum.  Lungs/Pleura: No pleural effusion, airspace consolidation or pneumothorax. No suspicious pulmonary nodule or mass identified. Upper Abdomen: No acute abnormality within the imaged portions of the upper abdomen. Hepatic steatosis. Musculoskeletal: No acute or suspicious osseous findings. IMPRESSION: 1. No significant noncardiac supplemental findings identified within the chest. 2. Hepatic steatosis. Electronically Signed   By: Signa Kell M.D.   On: 11/26/2023 13:53    Results for orders placed or performed during the hospital encounter of 12/19/23 (from the past 48 hours)  Glucose, capillary     Status: Abnormal   Collection Time: 12/22/23 11:27 AM  Result Value Ref Range   Glucose-Capillary 164 (H) 70 - 99 mg/dL    Comment: Glucose reference range applies only to samples taken after fasting for at least 8 hours.  Glucose, capillary     Status: Abnormal   Collection Time: 12/22/23  3:26 PM  Result Value Ref Range   Glucose-Capillary 192 (H) 70 - 99 mg/dL    Comment: Glucose reference range applies only to samples taken after fasting for at least 8 hours.  Glucose, capillary     Status: Abnormal   Collection Time: 12/22/23  7:14 PM  Result Value Ref Range   Glucose-Capillary 178 (H) 70 - 99 mg/dL    Comment: Glucose reference range applies only to samples taken after fasting for at least 8 hours.  Glucose, capillary     Status: None   Collection Time: 12/22/23 11:43 PM  Result Value Ref Range   Glucose-Capillary 98 70 - 99 mg/dL    Comment: Glucose reference range applies only to samples taken after fasting for at least 8 hours.  Glucose, capillary     Status: Abnormal   Collection Time: 12/23/23  3:38 AM  Result Value Ref Range   Glucose-Capillary 107 (H) 70 - 99 mg/dL    Comment: Glucose reference range applies only to samples taken after fasting for at least 8 hours.  Basic metabolic panel     Status: Abnormal   Collection Time: 12/23/23  3:39 AM  Result Value Ref Range   Sodium  126 (L) 135 - 145 mmol/L   Potassium 3.5 3.5 - 5.1 mmol/L   Chloride 92 (L) 98 - 111 mmol/L   CO2 21 (L) 22 -  32 mmol/L   Glucose, Bld 108 (H) 70 - 99 mg/dL    Comment: Glucose reference range applies only to samples taken after fasting for at least 8 hours.   BUN 67 (H) 8 - 23 mg/dL   Creatinine, Ser 5.95 (H) 0.61 - 1.24 mg/dL   Calcium 7.7 (L) 8.9 - 10.3 mg/dL   GFR, Estimated 21 (L) >60 mL/min    Comment: (NOTE) Calculated using the CKD-EPI Creatinine Equation (2021)    Anion gap 13 5 - 15    Comment: Performed at [facility], [address] [place], [place] [address]  CBC     Status: Abnormal   Collection Time: 12/23/23  3:39 AM  Result Value Ref Range   WBC 7.9 4.0 - 10.5 K/uL   RBC 2.99 (L) 4.22 - 5.81 MIL/uL   Hemoglobin 8.8 (L) 13.0 - 17.0 g/dL   HCT 64.3 (L) 32.9 - 51.8 %   MCV 84.3 80.0 - 100.0 fL   MCH 29.4 26.0 - 34.0 pg   MCHC 34.9 30.0 - 36.0 g/dL   RDW 84.1 (H) 66.0 - 63.0 %   Platelets 215 150 - 400 K/uL   nRBC 0.0 0.0 - 0.2 %    Comment: Performed at [facility], [address] [place], [place] [address]  Magnesium     Status: None   Collection Time: 12/23/23  3:39 AM  Result Value Ref Range   Magnesium 2.4 1.7 - 2.4 mg/dL    Comment: Performed at [facility], [address] [place], [place] [address]  Glucose, capillary     Status: Abnormal   Collection Time: 12/23/23  7:49 AM  Result Value Ref Range   Glucose-Capillary 146 (H) 70 - 99 mg/dL    Comment: Glucose reference range applies only to samples taken after fasting for at least 8 hours.  Glucose, capillary     Status: Abnormal   Collection Time: 12/23/23 11:24 AM  Result Value Ref Range   Glucose-Capillary 148 (H) 70 - 99 mg/dL    Comment: Glucose reference range applies only to samples taken after fasting for at least 8 hours.  Glucose, capillary     Status: Abnormal   Collection Time: 12/23/23  5:17 PM  Result Value Ref Range   Glucose-Capillary 135 (H) 70 - 99 mg/dL     Comment: Glucose reference range applies only to samples taken after fasting for at least 8 hours.  Glucose, capillary     Status: Abnormal   Collection Time: 12/23/23  7:46 PM  Result Value Ref Range   Glucose-Capillary 153 (H) 70 - 99 mg/dL    Comment: Glucose reference range applies only to samples taken after fasting for at least 8 hours.   Comment 1 Notify RN    Comment 2 Document in Chart   Glucose, capillary     Status: Abnormal   Collection Time: 12/23/23 11:18 PM  Result Value Ref Range   Glucose-Capillary 115 (H) 70 - 99 mg/dL    Comment: Glucose reference range applies only to samples taken after fasting for at least 8 hours.  Glucose, capillary     Status: Abnormal   Collection Time: 12/24/23  3:31 AM  Result Value Ref Range   Glucose-Capillary 129 (H) 70 - 99 mg/dL    Comment: Glucose reference range applies only to samples taken after fasting for at least 8 hours.  CBC     Status: Abnormal   Collection Time: 12/24/23  3:46 AM  Result Value Ref Range   WBC 8.7 4.0 - 10.5 K/uL   RBC 2.98 (L) 4.22 - 5.81 MIL/uL   Hemoglobin 8.7 (L) 13.0 - 17.0 g/dL   HCT 16.1 (L) 09.6 - 04.5 %   MCV 84.6 80.0 - 100.0 fL   MCH 29.2 26.0 - 34.0 pg   MCHC 34.5 30.0 - 36.0 g/dL   RDW 40.9 (H) 81.1 - 91.4 %   Platelets 225 150 - 400 K/uL   nRBC 0.0 0.0 - 0.2 %    Comment: Performed at [facility], [address], [place], [place] [address]  Basic metabolic panel     Status: Abnormal   Collection Time: 12/24/23  3:46 AM  Result Value Ref Range   Sodium 130 (L) 135 - 145 mmol/L   Potassium 4.1 3.5 - 5.1 mmol/L   Chloride 98 98 - 111 mmol/L   CO2 25 22 - 32 mmol/L   Glucose, Bld 123 (H) 70 - 99 mg/dL    Comment: Glucose reference range applies only to samples taken after fasting for at least 8 hours.   BUN 55 (H) 8 - 23 mg/dL   Creatinine, Ser 6.21 (H) 0.61 - 1.24 mg/dL   Calcium 7.7 (L) 8.9 - 10.3 mg/dL   GFR, Estimated 25 (L) >60 mL/min    Comment: (NOTE) Calculated using the  CKD-EPI Creatinine Equation (2021)    Anion gap 7 5 - 15    Comment: Performed at [facility], [address] [place], [place] [address]  Glucose, capillary     Status: Abnormal   Collection Time: 12/24/23  8:03 AM  Result Value Ref Range   Glucose-Capillary 101 (H) 70 - 99 mg/dL    Comment: Glucose reference range applies only to samples taken after fasting for at least 8 hours.     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 rate 16, height 5\' 8"  (1.727 m), weight 95.1 kg, SpO2 97%.    General appearance: alert, cooperative, and no distress Heart: regular rate and rhythm Lungs: clear to auscultation bilaterally Abdomen: benign Extremities: no edema Wound: incis healing well    Procedure:   CARDIOVASCULAR SURGERY OPERATIVE NOTE   12/20/2023  Sylas Twombly [id number]   Surgeon:  Ashley Akin, MD   First Assistant: Gershon Crane [facility]                               An experienced assistant was required given the complexity of this surgery and the standard of surgical care. The assistant was needed for exposure, dissection, suctioning, retraction of delicate tissues and sutures, instrument exchange and for overall help  during this procedure.       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                                            Pleural effusions                                           Atrial Fibrillation    Postoperative Diagnosis:  Same     Anesthesia:  General Endotracheal   Discharge Medications:  The patient has been discharged on:   1.Beta Blocker:  Yes [   ]                              No   [ y  ]                              If No, reason:  2.Ace Inhibitor/ARB: Yes [   ]                                     No  [   n ]                                     If No, reason:labile BP  3.Statin:   Yes [ y  ]                  No  [   ]                  If No, reason:  4.Ecasa:  Yes  Cove.Etienne   ]                  No   [   ]                  If No, reason:  Patient had ACS upon admission:n  Plavix/P2Y12 inhibitor: Yes [   ]                                      No  [  n ]     Discharge Instructions     Discharge patient   Complete by: As directed    Discharge disposition: 01-Home or Self Care   Discharge patient date: 12/24/2023      Allergies as of 12/24/2023  Reactions   Vytorin [ezetimibe-simvastatin] Other (See Comments)   back pain        Medication List     STOP taking these medications    apixaban 5 MG Tabs tablet Commonly known as: ELIQUIS   furosemide 40 MG tablet Commonly known as: LASIX   oxyCODONE 5 MG immediate release tablet Commonly known as: Oxy IR/ROXICODONE   potassium chloride SA 20 MEQ tablet Commonly known as: KLOR-CON M       TAKE these medications    amiodarone 200 MG tablet Commonly known as: PACERONE Take 1 tablet (200 mg total) by mouth daily. What changed:  how much to take when to take this additional instructions   aspirin EC 325 MG tablet Take 1 tablet (325 mg total) by mouth daily. Swallow whole. What changed:  medication strength how much to take   ezetimibe 10 MG  tablet Commonly known as: ZETIA Take 1 tablet (10 mg total) by mouth daily. What changed: when to take this   levothyroxine 25 MCG tablet Commonly known as: SYNTHROID TAKE 1 TABLET BY MOUTH EVERY DAY BEFORE BREAKFAST   metoprolol tartrate 50 MG tablet Commonly known as: LOPRESSOR Take 1 tablet (50 mg total) by mouth 2 (two) times daily.   multivitamin with minerals Tabs tablet Take 1 tablet by mouth in the morning. Centrum Silver   rosuvastatin 40 MG tablet Commonly known as: CRESTOR Take 1 tablet (40 mg total) by mouth daily. What changed: when to take this   traMADol 50 MG tablet Commonly known as: ULTRAM Take 1 tablet (50 mg total) by mouth every 6 (six) hours as needed for up to 7 days for moderate pain (pain score 4-6).        Follow-up Information     Eugenio Hoes, MD Follow up.   Specialty: Cardiothoracic Surgery Why: Please see discharge paperwork for details of follow-up appointment with Dr. Leafy Ro. Contact information: [address] [place] [address] [phone]         [facility] AT [address] Follow up.   Specialty: Radiology Why: On the date you are scheduled to see Dr. Leafy Ro please obtain a chest x-ray at [facility] 1 hour prior to this appointment. Contact information: [address] [place] [address] [phone]                Signed:  Renee Rival  12/24/2023, 8:37 AM

## 2023-12-20 NOTE — Brief Op Note (Signed)
 12/20/2023  1:18 PM  PATIENT:  [name]  78 y.o. male  PRE-OPERATIVE DIAGNOSIS:  PERICARDIAL EFFUSION  POST-OPERATIVE DIAGNOSIS:  PERICARDIAL EFFUSION  PROCEDURE:  Procedure(s): SUBXYPHOID PERICARDIAL WINDOW WITH CONVERSION TO STERNOTOMY. REPAIR OF RV INJURY (N/A) TRANSESOPHAGEAL ECHOCARDIOGRAM (TEE) (N/A) DRAINAGE OF PERICARDIAL FLUID CARDIOVERSION  SURGEON:  Surgeons and Role:    * Eugenio Hoes, MD - Primary  PHYSICIAN ASSISTANT: Chimaobi Casebolt PA-C  ASSISTANTS: none   ANESTHESIA:   general  EBL:  300 mL   BLOOD ADMINISTERED:1 UNIT PRBC'S  DRAINS: (4) Blake drain(s) in the PERICARDIUM AND PLEURAL DRAINS    LOCAL MEDICATIONS USED:  NONE  SPECIMEN:  No Specimen  DISPOSITION OF SPECIMEN:  N/A  COUNTS:  YES  TOURNIQUET:  * No tourniquets in log *  DICTATION: .Dragon Dictation  PLAN OF CARE: Admit to inpatient   PATIENT DISPOSITION:  ICU - extubated and stable.   Delay start of Pharmacological VTE agent (>24hrs) due to surgical blood loss or risk of bleeding: yes

## 2023-12-20 NOTE — Progress Notes (Signed)
  Echocardiogram Echocardiogram Transesophageal has been performed.  Delcie Roch 12/20/2023, 4:32 PM

## 2023-12-20 NOTE — Transfer of Care (Signed)
 Immediate Anesthesia Transfer of Care Note  Patient: [name]  Procedure(s) Performed: SUBXYPHOID PERICARDIAL WINDOW WITH CONVERSION TO STERNOTOMY. REPAIR OF RV INJURY (Chest) TRANSESOPHAGEAL ECHOCARDIOGRAM (TEE) DRAINAGE OF PERICARDIAL FLUID CARDIOVERSION  Patient Location: ICU  Anesthesia Type:General  Level of Consciousness: drowsy and patient cooperative  Airway & Oxygen Therapy: Patient Spontanous Breathing and Patient connected to face mask oxygen  Post-op Assessment: Report given to RN, Post -op Vital signs reviewed and stable, and Patient moving all extremities X 4  Post vital signs: Reviewed and stable  Last Vitals:  Vitals Value Taken Time  BP 111/63 12/20/23 1333  Temp    Pulse 79 12/20/23 1343  Resp 20 12/20/23 1343  SpO2 94 % 12/20/23 1343  Vitals shown include unfiled device data.  Last Pain:  Vitals:   12/20/23 0825  TempSrc:   PainSc: 3          Complications: No notable events documented.  Patient transported to ICU with standard monitors (HR, BP, SPO2, RR) and emergency drugs/equipment. Report given to bedside RN and respiratory therapist. Pt connected to ICU monitor. All questions answered and vital signs stable before leaving

## 2023-12-20 NOTE — Op Note (Signed)
 CARDIOVASCULAR SURGERY OPERATIVE NOTE  12/20/2023 [name] [id number]  Surgeon:  Ashley Akin, MD  First Assistant: Gershon Crane [facility]                               An experienced assistant was required given the complexity of this surgery and the standard of surgical care. The assistant was needed for exposure, dissection, suctioning, retraction of delicate tissues and sutures, instrument exchange and for overall help during this procedure.     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                                            Pleural effusions                                           Atrial Fibrillation   Postoperative Diagnosis:  Same   Procedure: Attempted Subxyphoid window with conversion to redo sternotomy with repair of RV injury and drainage of large pericardial and pleural effusions Synchronized Cardioversion  Anesthesia:  General Endotracheal   Clinical History/Surgical Indication: Progressive shortness of breath over the past 79 days 78 year old gentleman who is 2 weeks status post bioprosthetic aortic valve replacement and CABG x 2. Bedside point-of-care ultrasound performed by the ER physician shows a large pericardial effusion but no obvious tamponade physiology. A formal echo has been completed and is pending. Chest x-ray is pending. He does not appear to be volume overloaded on exam. His progressive shortness of breath may be related entirely to the pericardial effusion although he may also have pleural effusions and the recurrent atrial fibrillation may also be attributing to his symptoms. He is currently hemodynamically stable. Will admit to progressive care and keep n.p.o. until we have established the severity of the pericardial effusion.     Findings: Very large pericardial effusion that caused hemodynamic compromise with anesthesia which neccessitated quick drainage of effusion. A small injury to RV free edge was  created requiring redo sternotomy to control. Large liter bilateral effusions noted along with over 1200 cc pericardial effusion drained Successful cardioversion to NSR  Preparation:  The patient was seen in the preoperative holding area and the correct patient, correct operation were confirmed with the patient after reviewing the medical record and catheterization. The consent was signed by me. Preoperative antibiotics were given. A pulmonary arterial line and radial arterial line were placed by the anesthesia team. The patient was taken back to the operating room and positioned supine on the operating room table. After being placed under general endotracheal anesthesia by the anesthesia team a foley catheter was placed. The neck, chest, abdomen, and both legs were prepped with betadine soap and solution and draped in the usual sterile manner. A surgical time-out was taken and the correct patient and operative procedure were confirmed with the nursing and anesthesia staff.  Operation: Patient was prepped and draped quickly and of the lower portion of the previous sternal incision was extended inferiorly and the tissues quickly separated.  Utilizing finger dissection over the diaphragm a large pericardial effusion was entered and quickly drained with the suction catheter.  However it was noted that there was also some darker blood coming  superiorly and on examination a small tear in the RV free wall was noted.  Attempted suture closure was not able to be performed due to small hole and therefore rest of the sternal incision was reopened and the sternal wires removed.  With this and a little separation of the sternotomy successful pledgeted sutures were placed in the free wall of the RV to control the bleeding.  Following this successful complete drainage of the pericardial effusion was performed and also the bilateral pleural effusions.  24 [place] Blake drains were placed in both pleural spaces and the  additional drains placed into the anterior and posterior mediastinum.  These were brought out the inferior stab wounds and secured.  With adequate hemostasis and hemodynamics synchronized cardioversion was then performed with 100 J of energy to normal sinus rhythm.  Should be noted that TEE successfully cleared his left atrium of any thrombus.  With adequate hemostasis the tenotomy was closed with interrupted stainless steel wires and the presternal subcutaneous tissue and skin were closed multilayer's absorbable suture.  Sterile dressings were applied.

## 2023-12-20 NOTE — Progress Notes (Deleted)
      [address]       [place] [address]             [phone]         Procedure(s) (LRB): SUBXYPHOID PERICARDIAL WINDOW (N/A) TRANSESOPHAGEAL ECHOCARDIOGRAM (TEE) (N/A)   Total Length of Stay:  LOS: 0 days    SUBJECTIVE: Had some tachycardia this am but without apacing very slow with heart rate less than 30 ambulated  Vitals:   12/19/23 2321 12/20/23 0334  BP: 122/67 118/79  Pulse: 100   Resp: 20 20  Temp: 97.9 F (36.6 C) 98.5 F (36.9 C)  SpO2: 92% 91%    Intake/Output      02/20 0701 02/21 0700 02/21 0701 02/22 0700   P.O. 240    Total Intake(mL/kg) 240 (2.5)    Urine (mL/kg/hr) 225    Total Output 225    Net +15              CBC    Component Value Date/Time   WBC 16.8 (H) 12/19/2023 1829   RBC 2.96 (L) 12/19/2023 1829   HGB 9.3 (L) 12/19/2023 1829   HGB 14.5 07/10/2023 1145   HCT 27.0 (L) 12/19/2023 1829   HCT 42.3 07/10/2023 1145   PLT 428 (H) 12/19/2023 1829   PLT 136 (L) 07/10/2023 1145   MCV 91.2 12/19/2023 1829   MCV 96 07/10/2023 1145   MCH 31.4 12/19/2023 1829   MCHC 34.4 12/19/2023 1829   RDW 13.9 12/19/2023 1829   RDW 12.6 07/10/2023 1145   LYMPHSABS 2.3 03/15/2023 0753   MONOABS 0.6 03/15/2023 0753   EOSABS 0.4 03/15/2023 0753   BASOSABS 0.1 03/15/2023 0753   CMP     Component Value Date/Time   NA 128 (L) 12/20/2023 0255   NA 139 07/10/2023 1145   K 4.6 12/20/2023 0255   CL 88 (L) 12/20/2023 0255   CO2 22 12/20/2023 0255   GLUCOSE 178 (H) 12/20/2023 0255   BUN 53 (H) 12/20/2023 0255   BUN 19 07/10/2023 1145   CREATININE 2.35 (H) 12/20/2023 0255   CALCIUM 9.1 12/20/2023 0255   PROT 7.4 12/04/2023 1314   PROT 7.3 07/10/2023 1145   ALBUMIN 4.4 12/04/2023 1314   ALBUMIN 5.1 (H) 07/10/2023 1145   AST 47 (H) 12/04/2023 1314   ALT 48 (H) 12/04/2023 1314   ALKPHOS 62 12/04/2023 1314   BILITOT 1.1 12/04/2023 1314   BILITOT 0.8 07/10/2023 1145   GFRNONAA 28 (L) 12/20/2023 0255   GFRAA >60  02/13/2017 0739   ABG    Component Value Date/Time   PHART 7.391 12/06/2023 1849   PCO2ART 36.6 12/06/2023 1849   PO2ART 78 (L) 12/06/2023 1849   HCO3 22.3 12/06/2023 1849   TCO2 23 12/06/2023 1849   ACIDBASEDEF 2.0 12/06/2023 1849   O2SAT 96 12/06/2023 1849   CBG (last 3)  Recent Labs    12/19/23 1955 12/19/23 2320 12/20/23 0332  GLUCAP 209* 164* 168*  EXAM Lungs: overall clear Card: RR with soft murmur Ext: edematous Neuro: intact   ASSESSMENT: POD #3 SP MV repair/TV repair/MAZE Hemodynamics ok but with tachy brady issues will stop his amio and have EP evaluate Continue V pacing back up Needs more aggressive diuresis Will start elliquis after chest tubes out   Eugenio Hoes, MD 12/20/2023

## 2023-12-20 NOTE — Anesthesia Procedure Notes (Signed)
 Arterial Line Insertion Start/End2/21/2025 9:55 AM Performed by: Alease Medina, CRNA, CRNA  Patient location: Pre-op. Preanesthetic checklist: patient identified, IV checked, site marked, risks and benefits discussed, surgical consent, monitors and equipment checked, pre-op evaluation, timeout performed and anesthesia consent Lidocaine 1% used for infiltration Right, radial was placed Catheter size: 20 G Hand hygiene performed  and maximum sterile barriers used   Attempts: 1 Procedure performed without using ultrasound guided technique. Following insertion, dressing applied and Biopatch. Post procedure assessment: normal and unchanged  Patient tolerated the procedure well with no immediate complications.

## 2023-12-20 NOTE — Hospital Course (Addendum)
  History of Presenting Illness: Mr. [name]. [name] is a 78 year old gentleman with a past medical history notable for coronary artery disease, severe aortic stenosis, hypertension, dyslipidemia, and prediabetes.  He was recently referred to [place] for management of his coronary artery disease and aortic stenosis.  He underwent CABG x 2 and bioprosthetic AVR with a 23 mm Inspiris bioprosthetic valve by Dr. Leafy Ro.  [facility] on 12/06/2023.  His postoperative course was notable for atrial fibrillation managed with amiodarone and conversion back to sinus rhythm by the time of his discharge on postop day 6.  He also had acute kidney injury with a peak creatinine of 2.15 on postop day 2.  His renal function gradually improved after that.  He had good response to diuresis after surgery and was discharged home and weight about 3 kg below his preoperative weight.  He was continued on Lasix for 5 days, apixaban, amiodarone, and metoprolol 50 mg twice daily.  He said he felt he was gradually improving until about 3 days ago when he noticed he began getting aggressively short of breath with even minimal activity.  He notes that was about the same time that he stopped taking the Lasix.  He denies having any chest pain, dizziness, or palpitations.  He is also not aware of having any fever.  Appetite has been poor and he is only had couple of small hard bowel movements since his discharge a week ago.   [name] was brought to the emergency room by EMS earlier today for evaluation of progressive shortness of breath.  Dr. Andria Meuse, ED physician, contacted [place] after he performed a bedside echo showing a large pericardial effusion without tamponade physiology.  A formal echocardiogram has confirmed this and he was admitted for planned pericardial window.  Hospital course: The patient remained medically stable overnight and on 12/20/2023 he was taken to the operating room for attempted pericardial window.  Initial  incision was at the lower pole of the sternotomy incision but ultimately did required full reopening of the sternotomy.  The fluid was removed as was bilateral significant pleural effusions.  The patient tolerated the procedure well and was taken to the surgical intensive care unit in stable condition. He remained hemodynamically stable.  He also remained stable sinus rhythm.  He was followed by the critical care medicine team after surgery while in the ICU.  The mediastinal tubes were removed on postop day 2 but the pleural tubes were left in place for continued drainage. He had acute renal insufficiency related to hemodynamic compromise before the drainage procedure and his renal function worsened in the early postoperative recovery.  Diuretics were held until there was evidence of renal recovery. Pleural tubes were removed on POD3 and he was felt stable for transfer to the progressive unit.

## 2023-12-20 NOTE — Progress Notes (Signed)
 Patient off floor to OR

## 2023-12-20 NOTE — Interval H&P Note (Signed)
 History and Physical Interval Note:  12/20/2023 7:52 AM  [name]  has presented today for surgery, with the diagnosis of PERICARDIAL EFFUSION.  The various methods of treatment have been discussed with the patient and family. After consideration of risks, benefits and other options for treatment, the patient has consented to  Procedure(s): SUBXYPHOID PERICARDIAL WINDOW (N/A) TRANSESOPHAGEAL ECHOCARDIOGRAM (TEE) (N/A) as a surgical intervention.  The patient's history has been reviewed, patient examined, no change in status, stable for surgery.  I have reviewed the patient's chart and labs.  Questions were answered to the patient's satisfaction.     Eugenio Hoes

## 2023-12-20 NOTE — Anesthesia Postprocedure Evaluation (Signed)
 Anesthesia Post Note  Patient: [name]  Procedure(s) Performed: SUBXYPHOID PERICARDIAL WINDOW WITH CONVERSION TO STERNOTOMY. REPAIR OF RV INJURY (Chest) TRANSESOPHAGEAL ECHOCARDIOGRAM (TEE) DRAINAGE OF PERICARDIAL FLUID CARDIOVERSION     Patient location during evaluation: SICU Anesthesia Type: General Level of consciousness: awake and alert Pain management: pain level controlled Vital Signs Assessment: post-procedure vital signs reviewed and stable Respiratory status: spontaneous breathing, nonlabored ventilation, respiratory function stable and patient connected to face mask oxygen Cardiovascular status: blood pressure returned to baseline and stable Postop Assessment: no apparent nausea or vomiting Anesthetic complications: no   No notable events documented.  Last Vitals:  Vitals:   12/20/23 1500 12/20/23 1510  BP:    Pulse: 79 80  Resp: (!) 22 (!) 24  Temp: 36.9 C 36.9 C  SpO2: 97% 97%    Last Pain:  Vitals:   12/20/23 1420  TempSrc: Bladder  PainSc:                  Earl Lites P Blayton Huttner

## 2023-12-20 NOTE — Plan of Care (Signed)

## 2023-12-20 NOTE — Anesthesia Preprocedure Evaluation (Addendum)
 Anesthesia Evaluation  Patient identified by MRN, date of birth, ID band Patient awake    Reviewed: Allergy & Precautions, NPO status , Patient's Chart, lab work & pertinent test results  Airway Mallampati: II  TM Distance: >3 FB Neck ROM: Full    Dental no notable dental hx.    Pulmonary neg pulmonary ROS   Pulmonary exam normal        Cardiovascular hypertension, Pt. on medications and Pt. on home beta blockers + CAD  + Valvular Problems/Murmurs (s/p AVR) AS  Rhythm:Regular Rate:Tachycardia  ECHO:   1. Large pericardial effusion. The pericardial effusion is primarily posterior to the left ventricle and surrounding the apex, small component anterior to RV. There is evidence of constrictive physiology with respiratory related septal shift and IVC dilation with reduced inspiratory collapse. There is no RV diastolic collapse. Respirometer study suboptimal. Findings overall effusive constrictive physiology. BP 105/78, HR 75 bpm.  2. Left ventricular ejection fraction, by estimation, is 60 to 65%. The left ventricle has normal function. The left ventricle has no regional wall motion abnormalities. There is mild left ventricular hypertrophy. Left ventricular diastolic parameters are indeterminate.  3. Right ventricular systolic function is mildly reduced. The right ventricular size is normal. Tricuspid regurgitation signal is inadequate for assessing PA pressure.  4. Left atrial size was mildly dilated.  5. Right atrial size was mildly dilated.  6. The mitral valve is degenerative. Trivial mitral valve regurgitation. No evidence of mitral stenosis.  7. The aortic valve has been repaired/replaced. Aortic valve regurgitation is not visualized. There is a 23 mm Inspiris valve present in the aortic position. Procedure Date: 12/06/2023. Echo findings are consistent with normal structure and function of the aortic valve prosthesis. Aortic valve  area, by VTI measures 1.95 cm. Aortic valve mean gradient measures 8.0 mmHg. Aortic valve Vmax measures 1.96 m/s. Aortic valve acceleration time measures 61 msec.  8. The inferior vena cava is dilated in size with <50% respiratory variability, suggesting right atrial pressure of 15 mmHg.      Neuro/Psych negative neurological ROS  negative psych ROS   GI/Hepatic Neg liver ROS,,,  Endo/Other  Hypothyroidism    Renal/GU negative Renal ROS  negative genitourinary   Musculoskeletal  (+) Arthritis , Osteoarthritis,    Abdominal Normal abdominal exam  (+)   Peds  Hematology Lab Results      Component                Value               Date                      WBC                      16.8 (H)            12/19/2023                HGB                      9.3 (L)             12/19/2023                HCT                      27.0 (L)            12/19/2023  MCV                      91.2                12/19/2023                PLT                      428 (H)             12/19/2023              Anesthesia Other Findings   Reproductive/Obstetrics                             Anesthesia Physical Anesthesia Plan  ASA: 4  Anesthesia Plan: General   Post-op Pain Management:    Induction: Intravenous  PONV Risk Score and Plan: 2 and Ondansetron, Dexamethasone and Treatment may vary due to age or medical condition  Airway Management Planned: Mask and Oral ETT  Additional Equipment: Arterial line  Intra-op Plan:   Post-operative Plan: Extubation in OR and Possible Post-op intubation/ventilation  Informed Consent: I have reviewed the patients History and Physical, chart, labs and discussed the procedure including the risks, benefits and alternatives for the proposed anesthesia with the patient or authorized representative who has indicated his/her understanding and acceptance.     Dental advisory given  Plan Discussed with:  CRNA  Anesthesia Plan Comments: (-TEE MONITORING)       Anesthesia Quick Evaluation

## 2023-12-20 NOTE — Consult Note (Signed)
 NAME:  [name], MRN:  [id number], DOB:  Jan 12, 1946, LOS: 0 ADMISSION DATE:  12/19/2023, CONSULTATION DATE:  2/21 REFERRING MD:  Leafy Ro , CHIEF COMPLAINT:  critical care support post-op.    History of Present Illness:  78 year old male w/ h/o severe AS, CAD, HTN, dyslipidemia, prediabetes who presented to [facility] 2 weeks s/p 2V CABG and bioprosthetic AVR (he was discharged on day 6 post-op) post op course c/b AF (Was sinus at DC) and AKI w. Cr as high as 2.15 this also normalized prior to dc. Sent home on lasix X 5d, apixaban and amiodarone as well as BB.  About 3d PTA started to notice marked increase in shortness of breath (he initially noted this was about the same time he stopped the lasix).  Went to ER 2/20  In ER bedside ECHO obtained. This demonstrated large pericardial effusion. At this time there was no tamponade physiology. He was back in AD. On 2/21 he was brought to the ER for planned subxiphoid pericardial window & TEE.   In OR TEE was completed. This was neg for  thrombus and showed EF 65%. the sternal incision was opened and a large pericardial effusion was drained. During this time it was also noted there was some darker blood coming superior to site and under further inspection a small tear in the RV was noted.  Because of this the sternotomy had to be opened and sternal wires removed. The RV tear was then able to be repaired, and both sides of pleural drained w/ chest tubes placed in both spaces as well as two tubes placed in mediastinum.  The sternum was closed. He was extubated in OR and returned to ICU post op.  Intra op underwent synchronized cardioversion   EBL estimated at w/ another 1250 drained from pleural space.  Intra-op 2 units PRBC, 2 liters of crystalloid and 3 doses of albumin.  On arrival to ICU  Norepi at 12mcg/min, left mediastinal chest tube drained bloody pleural output. Right CT marked at 100 Hgb 7.4 (pre-op was 9.3) lowest intra-op  istat at 6.8.  No distress on simple facemask  PCCM asked to assist w/ post op care  Pertinent  Medical History  severe AS, CAD, HTN, dyslipidemia, prediabetes  2 weeks s/p 2V CABG and bioprosthetic AVR  Significant Hospital Events: Including procedures, antibiotic start and stop dates in addition to other pertinent events   2/21 s/p Attempted Subxyphoid window with conversion to redo sternotomy with repair of RV injury and drainage of large pericardial and pleural effusions and Synchronized Cardioversion  Interim History / Subjective:  No distress on arrival  Objective   Blood pressure 109/61, pulse 80, temperature 98.4 F (36.9 C), resp. rate (!) 24, height 5\' 8"  (1.727 m), weight 97.1 kg, SpO2 97%.        Intake/Output Summary (Last 24 hours) at 12/20/2023 1526 Last data filed at 12/20/2023 1505 Gross per 24 hour  Intake 3720.8 ml  Output 2475 ml  Net 1245.8 ml   Filed Weights   12/19/23 1214  Weight: 97.1 kg    Examination: General: resting in bed no distress HENT: NCAT MMM Lungs: bilateral crackles. No accessory use currently on simple mask Cardiovascular: RRR Cardioplum tubes: Has bilateral CTs and bilateral mediastinal tubes. 550 out bloody from left and 100 out from right side Abdomen: soft not tender  Extremities: warm pulses are strong Neuro: awake and oriented  GU: yellow urine   Resolved Hospital Problem list  Assessment & Plan:   S/p recent 2V CABG and AVR w/ bioprosthetic valve c/b post open heart surgery Pericardial effusion and  L>R bilateral pleural effusions w/ associated progressive dyspnea. Now s/p redo sternotomy w/ drainage or large pericardial and bilateral pleural effusions and surgical repair of RV injury Plan Cont multi-modal post op pain rx Cont pulse ox Wean fio2 for sats > 92% Holding systemic AC until directed otherwise from surgical team  Close obs of CTs. Surgery to be notified if >150 ml in hour from either chest tube  Tight  glycemic control  Early mobilization  Resume zetia Holding BB while on pressors  Asa ordered    Circulatory shock. Suspect mixed picture of post-op vasoplegia and volume depletion from ABL Plan To get 2 more units PRBC Titrate NE for MAP > 65 Holding his metoprolol for now  Ensure euvolemic Watch for evidence of further acute blood loss  Ensure coags are corrected  Afib. Click Here to Calculate/Change CHADS2VASc Score The patient's CHADS2-VASc score is 6 indicating a 9.7% annual risk of stroke.  Therefore, anticoagulation is recommended.      Initially was in AF post-op. Converted prior to dc and back in AF on admit. He is now s/p synchronized cardioversion in OR 100J And in NSR Plan Cont amiodarone  Rate control Eventually needs to go back on systemic AC   Recurrent AKI w/ progressive hyponatremia Scr as high as 2.15 last hospital stay, Discharged at 1.57 peaked again on 2/21 at 2.35. he is s/p 2 liters of crystalloid and 2 units PRBC in OR. Suspect may have been a component of this that was due to diuretics  Plan Close obs of serial chems Strict I&O Renal dose meds MAP > 65  Expected post-operative anemia Plan Transfusion as above Serial CBCs Trigger for transfusion going forward to be determined by clinical status or sig drop in hgb  Leukocytosis (reactive) Plan Monitor   Prediabetes w/ hyperglycemia Plan Ssi Goal 140-180  Best Practice (right click and "Reselect all SmartList Selections" daily)   Diet/type: NPO DVT prophylaxis SCD Pressure ulcer(s): N/A GI prophylaxis: PPI Lines: Central line Foley:  Yes, and it is still needed Code Status:  full code Last date of multidisciplinary goals of care discussion [per primary ]  Labs   CBC: Recent Labs  Lab 12/19/23 1228 12/19/23 1829 12/20/23 0935 12/20/23 1220 12/20/23 1341  WBC 15.8* 16.8*  --   --  20.6*  HGB 8.7* 9.3* 10.5* 6.8* 6.8*  7.4*  HCT 25.8* 27.0* 31.0* 20.0* 20.0*  21.8*  MCV 93.1 91.2   --   --  87.6  PLT 427* 428*  --   --  304    Basic Metabolic Panel: Recent Labs  Lab 12/19/23 1228 12/19/23 1829 12/20/23 0255 12/20/23 0935 12/20/23 1220 12/20/23 1341  NA 131*  --  128* 127* 129* 130*  K 4.6  --  4.6 5.1 4.9 5.2*  CL 89*  --  88* 92*  --   --   CO2 23  --  22  --   --   --   GLUCOSE 228*  --  178* 177*  --   --   BUN 41*  --  53* 58*  --   --   CREATININE 2.00*  --  2.35* 2.80*  --   --   CALCIUM 9.4  --  9.1  --   --   --   MG  --  2.5*  --   --   --   --  GFR: Estimated Creatinine Clearance: 25.8 mL/min (A) (by C-G formula based on SCr of 2.8 mg/dL (H)). Recent Labs  Lab 12/19/23 1228 12/19/23 1829 12/20/23 1341  WBC 15.8* 16.8* 20.6*    Liver Function Tests: No results for input(s): "AST", "ALT", "ALKPHOS", "BILITOT", "PROT", "ALBUMIN" in the last 168 hours. No results for input(s): "LIPASE", "AMYLASE" in the last 168 hours. No results for input(s): "AMMONIA" in the last 168 hours.  ABG    Component Value Date/Time   PHART 7.445 12/20/2023 1341   PCO2ART 28.3 (L) 12/20/2023 1341   PO2ART 71 (L) 12/20/2023 1341   HCO3 19.5 (L) 12/20/2023 1341   TCO2 20 (L) 12/20/2023 1341   ACIDBASEDEF 4.0 (H) 12/20/2023 1341   O2SAT 95 12/20/2023 1341     Coagulation Profile: No results for input(s): "INR", "PROTIME" in the last 168 hours.  Cardiac Enzymes: No results for input(s): "CKTOTAL", "CKMB", "CKMBINDEX", "TROPONINI" in the last 168 hours.  HbA1C: Hgb A1c MFr Bld  Date/Time Value Ref Range Status  12/04/2023 01:14 PM 5.7 (H) 4.8 - 5.6 % Final    Comment:    (NOTE) Pre diabetes:          5.7%-6.4%  Diabetes:              >6.4%  Glycemic control for   <7.0% adults with diabetes   07/05/2023 08:37 AM 5.8 4.6 - 6.5 % Final    Comment:    Glycemic Control Guidelines for People with Diabetes:Non Diabetic:  <6%Goal of Therapy: <7%Additional Action Suggested:  >8%     CBG: Recent Labs  Lab 12/19/23 1955 12/19/23 2320 12/20/23 0332  12/20/23 0839 12/20/23 1339  GLUCAP 209* 164* 168* 180* 194*    Review of Systems:   Reports mild expected post op CP  Past Medical History:  He,  has a past medical history of Arthritis, Back pain, Coronary artery disease, Diverticulosis, Heart murmur, History of colon polyps, Hyperlipidemia, Hypertension, Hypothyroidism, Moderate aortic stenosis, Pre-diabetes, and Thyroid disease.   Surgical History:   Past Surgical History:  Procedure Laterality Date   AORTIC VALVE REPLACEMENT N/A 12/06/2023   Procedure: AORTIC VALVE REPLACEMENT (AVR) USING INSPIRIS AORTIC VALVE SIZE 23 MM;  Surgeon: Eugenio Hoes, MD;  Location: [facility] OR;  Service: Open Heart Surgery;  Laterality: N/A;   COLONOSCOPY  2018   SA-MAC-suprep-ageq with lavage-tics/int hems/TA x 5 frags   CORONARY ARTERY BYPASS GRAFT N/A 12/06/2023   Procedure: CORONARY ARTERY BYPASS GRAFTING (CABG) TIMES TWO USING LEFT INTERNAL MAMMARY ARTERY AND ENDOSCOPICALLY HARVESTED RIGHT GREATER SAPHENOUS VEIN;  Surgeon: Eugenio Hoes, MD;  Location: [facility] OR;  Service: Open Heart Surgery;  Laterality: N/A;   INGUINAL HERNIA REPAIR Bilateral 02/13/2017   Procedure: LAPAROSCOPIC BILATERAL INGUINAL HERNIA REPAIR WITH MESH;  Surgeon: Abigail Miyamoto, MD;  Location: [facility] OR;  Service: General;  Laterality: Bilateral;   LASIK     POLYPECTOMY  2018   TA x 5   RIGHT HEART CATH AND CORONARY ANGIOGRAPHY N/A 07/03/2023   Procedure: RIGHT HEART CATH AND CORONARY ANGIOGRAPHY;  Surgeon: [name], MD;  Location: [facility] INVASIVE CV LAB;  Service: Cardiovascular;  Laterality: N/A;   TEE WITHOUT CARDIOVERSION N/A 12/06/2023   Procedure: TRANSESOPHAGEAL ECHOCARDIOGRAM (TEE);  Surgeon: Eugenio Hoes, MD;  Location: [facility];  Service: Open Heart Surgery;  Laterality: N/A;   UMBILICAL HERNIA REPAIR N/A 02/13/2017   Procedure: LAPAROSCOPIC UMBILICAL HERNIA REPAIR;  Surgeon: Abigail Miyamoto, MD;  Location: [facility] OR;  Service: General;  Laterality: N/A;  WISDOM TOOTH EXTRACTION        Social History:   reports that he has never smoked. He has never used smokeless tobacco. He reports current alcohol use of about 21.0 - 28.0 standard drinks of alcohol per week. He reports that he does not use drugs.   Family History:  His family history includes Cancer in his mother; Diabetes in his father; Emphysema in his father; HIV in his brother. There is no history of Colon cancer, Colon polyps, Esophageal cancer, Rectal cancer, or Stomach cancer.   Allergies Allergies  Allergen Reactions   Vytorin [Ezetimibe-Simvastatin] Other (See Comments)    back pain     Home Medications  Prior to Admission medications   Medication Sig Start Date End Date Taking? Authorizing Provider  amiodarone (PACERONE) 200 MG tablet Take 2 tablets (400 mg total) by mouth 3 (three) times daily. For 3 days, then twice daily for 3 days, then once daily for 3 days, then 200 mg daily 12/12/23  Yes Gold, Wayne E, PA-C  apixaban (ELIQUIS) 5 MG TABS tablet Take 1 tablet (5 mg total) by mouth 2 (two) times daily. 12/12/23  Yes Gold, Glenice Laine, PA-C  aspirin EC 81 MG tablet Take 1 tablet (81 mg total) by mouth daily. Swallow whole. 06/24/23  Yes Turner, Cornelious Bryant, MD  ezetimibe (ZETIA) 10 MG tablet Take 1 tablet (10 mg total) by mouth daily. Patient taking differently: Take 10 mg by mouth every evening. 09/23/23 12/22/23 Yes Turner, Cornelious Bryant, MD  levothyroxine (SYNTHROID) 25 MCG tablet TAKE 1 TABLET BY MOUTH EVERY DAY BEFORE BREAKFAST 11/22/23  Yes Tower, Audrie Gallus, MD  metoprolol tartrate (LOPRESSOR) 50 MG tablet Take 1 tablet (50 mg total) by mouth 2 (two) times daily. 12/12/23  Yes Gold, Glenice Laine, PA-C  Multiple Vitamin (MULTIVITAMIN WITH MINERALS) TABS tablet Take 1 tablet by mouth in the morning. Centrum Silver   Yes [provider]  oxyCODONE (OXY IR/ROXICODONE) 5 MG immediate release tablet Take 1 tablet (5 mg total) by mouth every 6 (six) hours as needed for severe pain (pain score 7-10). 12/12/23  Yes Gold, Wayne  E, PA-C  rosuvastatin (CRESTOR) 40 MG tablet Take 1 tablet (40 mg total) by mouth daily. Patient taking differently: Take 40 mg by mouth every evening. 07/05/23  Yes Tower, Audrie Gallus, MD  furosemide (LASIX) 40 MG tablet Take 1 tablet (40 mg total) by mouth daily. Patient not taking: Reported on 12/19/2023 12/12/23   Gershon Crane E, PA-C  potassium chloride SA (KLOR-CON M) 20 MEQ tablet Take 2 tablets (40 mEq total) by mouth daily. Patient not taking: Reported on 12/19/2023 12/12/23   Rowe Clack, PA-C     Critical care time: 35 min

## 2023-12-20 NOTE — Progress Notes (Signed)
 Dr. Nance Pew notified of BP 88/75, HR 104 500 mL bolus ordered. 500 mL bolus finished at 1104  Patient states he has not voided since last night.  Denies the urge to void. Denies pressure in his bladder.  Bladder scan at 1030 showed 0mL. Performed bladder scan 6 times. 1 reading showed 29mL.

## 2023-12-20 NOTE — Progress Notes (Signed)
 Procedure(s) (LRB): SUBXYPHOID PERICARDIAL WINDOW (N/A) TRANSESOPHAGEAL ECHOCARDIOGRAM (TEE) (N/A) Subjective: Remains SOB  Objective: Vital signs in last 24 hours: Temp:  [97.3 F (36.3 C)-98.5 F (36.9 C)] 98.5 F (36.9 C) (02/21 0334) Pulse Rate:  [86-102] 100 (02/20 2321) Cardiac Rhythm: Atrial fibrillation (02/20 2020) Resp:  [20-31] 20 (02/21 0334) BP: (103-138)/(64-87) 118/79 (02/21 0334) SpO2:  [91 %-100 %] 91 % (02/21 0334) Weight:  [97.1 kg] 97.1 kg (02/20 1214)  Hemodynamic parameters for last 24 hours:    Intake/Output from previous day: 02/20 0701 - 02/21 0700 In: 240 [P.O.:240] Out: 225 [Urine:225] Intake/Output this shift: No intake/output data recorded.  General appearance: alert, cooperative, and no distress Heart: irregularly irregular rhythm Lungs: mildly dim left base Abdomen: benign Extremities: no edema Wound: incis healing well  Lab Results: Recent Labs    12/19/23 1228 12/19/23 1829  WBC 15.8* 16.8*  HGB 8.7* 9.3*  HCT 25.8* 27.0*  PLT 427* 428*   BMET:  Recent Labs    12/19/23 1228 12/20/23 0255  NA 131* 128*  K 4.6 4.6  CL 89* 88*  CO2 23 22  GLUCOSE 228* 178*  BUN 41* 53*  CREATININE 2.00* 2.35*  CALCIUM 9.4 9.1    PT/INR: No results for input(s): "LABPROT", "INR" in the last 72 hours. ABG    Component Value Date/Time   PHART 7.391 12/06/2023 1849   HCO3 22.3 12/06/2023 1849   TCO2 23 12/06/2023 1849   ACIDBASEDEF 2.0 12/06/2023 1849   O2SAT 96 12/06/2023 1849   CBG (last 3)  Recent Labs    12/19/23 1955 12/19/23 2320 12/20/23 0332  GLUCAP 209* 164* 168*    Meds Scheduled Meds:  amiodarone  400 mg Oral BID   aspirin EC  81 mg Oral Daily   insulin aspart  0-15 Units Subcutaneous Q4H   levothyroxine  25 mcg Oral Q0600   metoprolol tartrate  50 mg Oral BID   rosuvastatin  40 mg Oral Daily   sodium chloride flush  3 mL Intravenous Q12H   Continuous Infusions: PRN Meds:.acetaminophen **OR**  acetaminophen, diphenhydrAMINE, ondansetron **OR** ondansetron (ZOFRAN) IV, oxyCODONE, polyethylene glycol, sorbitol  Xrays DG Chest 2 View Result Date: 12/19/2023 CLINICAL DATA:  Pericardial effusion. EXAM: CHEST - 2 VIEW COMPARISON:  Chest radiograph dated 12/11/2023. FINDINGS: There is cardiomegaly with vascular congestion. Small bilateral pleural effusions, left greater than right. Left lung base atelectasis or infiltrate. No pneumothorax. Atherosclerotic calcification of the aorta. Median sternotomy wires. No acute osseous pathology. IMPRESSION: 1. Cardiomegaly with vascular congestion and small bilateral pleural effusions. 2. Left lung base atelectasis or infiltrate. Electronically Signed   By: Elgie Collard M.D.   On: 12/19/2023 14:40   ECHOCARDIOGRAM COMPLETE Result Date: 12/19/2023    ECHOCARDIOGRAM REPORT   Patient Name:   [name] Date of Exam: 12/19/2023 Medical Rec #:  [id number]              Height:       68.0 in Accession #:    [id number]             Weight:       214.0 lb Date of Birth:  01-13-1946             BSA:          2.103 m Patient Age:    77 years               BP:           105/78  mmHg Patient Gender: M                      HR:           75 bpm. Exam Location:  Inpatient Procedure: 2D Echo, Color Doppler and Cardiac Doppler (Both Spectral and Color            Flow Doppler were utilized during procedure). STAT ECHO Indications:    Pericardial Effusion  History:        Patient has prior history of Echocardiogram examinations, most                 recent 10/25/2023. CAD, Aortic Valve Disease; Risk                 Factors:Dyslipidemia and Hypothyrdoism.                 Aortic Valve: 23 mm Inspiris valve is present in the aortic                 position. Procedure Date: 12/06/2023.  Sonographer:    Raeford Razor RDCS Referring Phys: Eugenio Hoes  Sonographer Comments: Image acquisition challenging due to patient body habitus. IMPRESSIONS  1. Large pericardial effusion. The  pericardial effusion is primarily posterior to the left ventricle and surrounding the apex, small component anterior to RV. There is evidence of constrictive physiology with respiratory related septal shift and IVC dilation with reduced inspiratory collapse. There is no RV diastolic collapse. Respirometer study suboptimal. Findings overall effusive constrictive physiology. BP 105/78, HR 75 bpm.  2. Left ventricular ejection fraction, by estimation, is 60 to 65%. The left ventricle has normal function. The left ventricle has no regional wall motion abnormalities. There is mild left ventricular hypertrophy. Left ventricular diastolic parameters are indeterminate.  3. Right ventricular systolic function is mildly reduced. The right ventricular size is normal. Tricuspid regurgitation signal is inadequate for assessing PA pressure.  4. Left atrial size was mildly dilated.  5. Right atrial size was mildly dilated.  6. The mitral valve is degenerative. Trivial mitral valve regurgitation. No evidence of mitral stenosis.  7. The aortic valve has been repaired/replaced. Aortic valve regurgitation is not visualized. There is a 23 mm Inspiris valve present in the aortic position. Procedure Date: 12/06/2023. Echo findings are consistent with normal structure and function of the aortic valve prosthesis. Aortic valve area, by VTI measures 1.95 cm. Aortic valve mean gradient measures 8.0 mmHg. Aortic valve Vmax measures 1.96 m/s. Aortic valve acceleration time measures 61 msec.  8. The inferior vena cava is dilated in size with <50% respiratory variability, suggesting right atrial pressure of 15 mmHg. FINDINGS  Left Ventricle: Left ventricular ejection fraction, by estimation, is 60 to 65%. The left ventricle has normal function. The left ventricle has no regional wall motion abnormalities. The left ventricular internal cavity size was normal in size. There is  mild left ventricular hypertrophy. Abnormal (paradoxical) septal  motion consistent with post-operative status. Left ventricular diastolic parameters are indeterminate. Right Ventricle: The right ventricular size is normal. No increase in right ventricular wall thickness. Right ventricular systolic function is mildly reduced. Tricuspid regurgitation signal is inadequate for assessing PA pressure. Left Atrium: Left atrial size was mildly dilated. Right Atrium: Right atrial size was mildly dilated. Pericardium: A large pericardial effusion is present. The pericardial effusion is posterior to the left ventricle and surrounding the apex. Mitral Valve: The mitral valve is degenerative in appearance. Mild to moderate mitral annular  calcification. Trivial mitral valve regurgitation. No evidence of mitral valve stenosis. Tricuspid Valve: The tricuspid valve is normal in structure. Tricuspid valve regurgitation is mild . No evidence of tricuspid stenosis. Aortic Valve: The aortic valve has been repaired/replaced. Aortic valve regurgitation is not visualized. Aortic valve mean gradient measures 8.0 mmHg. Aortic valve peak gradient measures 15.4 mmHg. Aortic valve area, by VTI measures 1.95 cm. There is a 23 mm Inspiris valve present in the aortic position. Procedure Date: 12/06/2023. Echo findings are consistent with normal structure and function of the aortic valve prosthesis. Pulmonic Valve: The pulmonic valve was normal in structure. Pulmonic valve regurgitation is trivial. No evidence of pulmonic stenosis. Aorta: The aortic root is normal in size and structure. Venous: The inferior vena cava is dilated in size with less than 50% respiratory variability, suggesting right atrial pressure of 15 mmHg. IAS/Shunts: No atrial level shunt detected by color flow Doppler.  LEFT VENTRICLE PLAX 2D LVIDd:         5.20 cm      Diastology LVIDs:         3.50 cm      LV e' medial:    7.07 cm/s LV PW:         1.10 cm      LV E/e' medial:  16.7 LV IVS:        1.10 cm      LV e' lateral:   8.70 cm/s LVOT  diam:     2.30 cm      LV E/e' lateral: 13.6 LV SV:         60 LV SV Index:   29 LVOT Area:     4.15 cm  LV Volumes (MOD) LV vol d, MOD A2C: 110.0 ml LV vol d, MOD A4C: 113.0 ml LV vol s, MOD A2C: 47.3 ml LV vol s, MOD A4C: 51.3 ml LV SV MOD A2C:     62.7 ml LV SV MOD A4C:     113.0 ml LV SV MOD BP:      60.5 ml RIGHT VENTRICLE            IVC RV Basal diam:  3.10 cm    IVC diam: 2.60 cm RV Mid diam:    2.00 cm RV S prime:     6.64 cm/s TAPSE (M-mode): 1.0 cm LEFT ATRIUM             Index        RIGHT ATRIUM           Index LA diam:        4.00 cm 1.90 cm/m   RA Area:     26.30 cm LA Vol (A2C):   99.9 ml 47.50 ml/m  RA Volume:   80.40 ml  38.22 ml/m LA Vol (A4C):   74.0 ml 35.18 ml/m LA Biplane Vol: 89.3 ml 42.46 ml/m  AORTIC VALVE AV Area (Vmax):    2.09 cm AV Area (Vmean):   2.03 cm AV Area (VTI):     1.95 cm AV Vmax:           196.08 cm/s AV Vmean:          128.431 cm/s AV VTI:            0.308 m AV Peak Grad:      15.4 mmHg AV Mean Grad:      8.0 mmHg LVOT Vmax:         98.40 cm/s LVOT Vmean:  62.880 cm/s LVOT VTI:          0.145 m LVOT/AV VTI ratio: 0.47  AORTA Ao Root diam: 3.20 cm Ao Asc diam:  3.50 cm MITRAL VALVE MV Area (PHT): 4.10 cm     SHUNTS MV Decel Time: 185 msec     Systemic VTI:  0.14 m MV E velocity: 118.00 cm/s  Systemic Diam: 2.30 cm MV A velocity: 44.70 cm/s MV E/A ratio:  2.64 Luis Brass MD Electronically signed by Luis Brass MD Signature Date/Time: 12/19/2023/2:25:46 PM    Final     Assessment/Plan: S/P Procedure(s) (LRB): SUBXYPHOID PERICARDIAL WINDOW (N/A) TRANSESOPHAGEAL ECHOCARDIOGRAM (TEE) (N/A)  1 afeb, VSS, afib 80's-100's 2 O2 sats good on RA 3 creat rising, 2.35 , was 1.5 at d/c ,BUN 53,  may be related to pericardial effusion 4 CBG 160's 200's 5 sodium 128- off diuretics currently 6 OR later today for pericardial window    LOS: 0 days    Rowe Clack 12/20/2023

## 2023-12-20 NOTE — Anesthesia Procedure Notes (Signed)
 Procedure Name: Intubation Date/Time: 12/20/2023 12:18 PM  Performed by: Alease Medina, CRNAPre-anesthesia Checklist: Patient identified, Emergency Drugs available, Suction available and Patient being monitored Patient Re-evaluated:Patient Re-evaluated prior to induction Oxygen Delivery Method: Circle system utilized Preoxygenation: Pre-oxygenation with 100% oxygen Induction Type: IV induction Ventilation: Mask ventilation without difficulty Laryngoscope Size: Mac and 4 Grade View: Grade I Tube type: Oral Tube size: 8.0 mm Number of attempts: 1 Airway Equipment and Method: Stylet and Oral airway Placement Confirmation: ETT inserted through vocal cords under direct vision, positive ETCO2 and breath sounds checked- equal and bilateral Secured at: 23 cm Tube secured with: Tape Dental Injury: Teeth and Oropharynx as per pre-operative assessment

## 2023-12-20 NOTE — Discharge Instructions (Signed)
Discharge Instructions: ° °1. You may shower, please wash incisions daily with soap and water and keep dry.  If you wish to cover wounds with dressing you may do so but please keep clean and change daily.  No tub baths or swimming until incisions have completely healed.  If your incisions become red or develop any drainage please call our office at [phone] ° °2. No Driving until cleared by surgeons office and you are no longer using narcotic pain medications ° °3. Monitor your weight daily.. Please use the same scale and weigh at same time... If you gain 5-10 lbs in 48 hours with associated lower extremity swelling, please contact our office at [phone] ° °4. Fever of 101.5 for at least 24 hours with no source, please contact our office at [phone] ° °5. Activity- up as tolerated, please walk at least 3 times per day.  Avoid strenuous activity, no lifting, pushing, or pulling with your arms over 8-10 lbs for a minimum of 6 weeks ° °6. If any questions or concerns arise, please do not hesitate to contact our office at [phone] °

## 2023-12-21 ENCOUNTER — Inpatient Hospital Stay (HOSPITAL_COMMUNITY): Payer: Medicare HMO

## 2023-12-21 DIAGNOSIS — I4891 Unspecified atrial fibrillation: Secondary | ICD-10-CM | POA: Diagnosis not present

## 2023-12-21 DIAGNOSIS — J90 Pleural effusion, not elsewhere classified: Secondary | ICD-10-CM | POA: Diagnosis not present

## 2023-12-21 DIAGNOSIS — K5900 Constipation, unspecified: Secondary | ICD-10-CM

## 2023-12-21 DIAGNOSIS — R571 Hypovolemic shock: Secondary | ICD-10-CM | POA: Diagnosis not present

## 2023-12-21 DIAGNOSIS — I3139 Other pericardial effusion (noninflammatory): Secondary | ICD-10-CM | POA: Diagnosis not present

## 2023-12-21 LAB — BASIC METABOLIC PANEL
Anion gap: 16 — ABNORMAL HIGH (ref 5–15)
BUN: 65 mg/dL — ABNORMAL HIGH (ref 8–23)
CO2: 18 mmol/L — ABNORMAL LOW (ref 22–32)
Calcium: 7.1 mg/dL — ABNORMAL LOW (ref 8.9–10.3)
Chloride: 95 mmol/L — ABNORMAL LOW (ref 98–111)
Creatinine, Ser: 3.16 mg/dL — ABNORMAL HIGH (ref 0.61–1.24)
GFR, Estimated: 20 mL/min — ABNORMAL LOW (ref >60)
Glucose, Bld: 123 mg/dL — ABNORMAL HIGH (ref 70–99)
Potassium: 3.6 mmol/L (ref 3.5–5.1)
Sodium: 129 mmol/L — ABNORMAL LOW (ref 135–145)

## 2023-12-21 LAB — CBC WITH DIFFERENTIAL/PLATELET
Abs Immature Granulocytes: 0.16 10*3/uL — ABNORMAL HIGH (ref 0.00–0.07)
Basophils Absolute: 0 10*3/uL (ref 0.0–0.1)
Basophils Relative: 0 %
Eosinophils Absolute: 0 10*3/uL (ref 0.0–0.5)
Eosinophils Relative: 0 %
HCT: 27.9 % — ABNORMAL LOW (ref 39.0–52.0)
Hemoglobin: 9.9 g/dL — ABNORMAL LOW (ref 13.0–17.0)
Immature Granulocytes: 1 %
Lymphocytes Relative: 7 %
Lymphs Abs: 1.2 10*3/uL (ref 0.7–4.0)
MCH: 29.6 pg (ref 26.0–34.0)
MCHC: 35.5 g/dL (ref 30.0–36.0)
MCV: 83.5 fL (ref 80.0–100.0)
Monocytes Absolute: 1.1 10*3/uL — ABNORMAL HIGH (ref 0.1–1.0)
Monocytes Relative: 7 %
Neutro Abs: 13.6 10*3/uL — ABNORMAL HIGH (ref 1.7–7.7)
Neutrophils Relative %: 85 %
Platelets: 249 10*3/uL (ref 150–400)
RBC: 3.34 MIL/uL — ABNORMAL LOW (ref 4.22–5.81)
RDW: 17.7 % — ABNORMAL HIGH (ref 11.5–15.5)
WBC: 16.1 10*3/uL — ABNORMAL HIGH (ref 4.0–10.5)
nRBC: 0.1 % (ref 0.0–0.2)

## 2023-12-21 LAB — COMPREHENSIVE METABOLIC PANEL
ALT: 204 U/L — ABNORMAL HIGH (ref 0–44)
AST: 279 U/L — ABNORMAL HIGH (ref 15–41)
Albumin: 3 g/dL — ABNORMAL LOW (ref 3.5–5.0)
Alkaline Phosphatase: 57 U/L (ref 38–126)
Anion gap: 18 — ABNORMAL HIGH (ref 5–15)
BUN: 64 mg/dL — ABNORMAL HIGH (ref 8–23)
CO2: 21 mmol/L — ABNORMAL LOW (ref 22–32)
Calcium: 8.7 mg/dL — ABNORMAL LOW (ref 8.9–10.3)
Chloride: 92 mmol/L — ABNORMAL LOW (ref 98–111)
Creatinine, Ser: 3.12 mg/dL — ABNORMAL HIGH (ref 0.61–1.24)
GFR, Estimated: 20 mL/min — ABNORMAL LOW (ref >60)
Glucose, Bld: 163 mg/dL — ABNORMAL HIGH (ref 70–99)
Potassium: 4.5 mmol/L (ref 3.5–5.1)
Sodium: 131 mmol/L — ABNORMAL LOW (ref 135–145)
Total Bilirubin: 1.1 mg/dL (ref 0.0–1.2)
Total Protein: 5.6 g/dL — ABNORMAL LOW (ref 6.5–8.1)

## 2023-12-21 LAB — GLUCOSE, CAPILLARY
Glucose-Capillary: 136 mg/dL — ABNORMAL HIGH (ref 70–99)
Glucose-Capillary: 141 mg/dL — ABNORMAL HIGH (ref 70–99)
Glucose-Capillary: 148 mg/dL — ABNORMAL HIGH (ref 70–99)
Glucose-Capillary: 172 mg/dL — ABNORMAL HIGH (ref 70–99)
Glucose-Capillary: 205 mg/dL — ABNORMAL HIGH (ref 70–99)
Glucose-Capillary: 213 mg/dL — ABNORMAL HIGH (ref 70–99)

## 2023-12-21 LAB — PROTIME-INR
INR: 2 — ABNORMAL HIGH (ref 0.8–1.2)
Prothrombin Time: 22.5 s — ABNORMAL HIGH (ref 11.4–15.2)

## 2023-12-21 LAB — CBC
HCT: 24 % — ABNORMAL LOW (ref 39.0–52.0)
Hemoglobin: 8.4 g/dL — ABNORMAL LOW (ref 13.0–17.0)
MCH: 30 pg (ref 26.0–34.0)
MCHC: 35 g/dL (ref 30.0–36.0)
MCV: 85.7 fL (ref 80.0–100.0)
Platelets: 250 10*3/uL (ref 150–400)
RBC: 2.8 MIL/uL — ABNORMAL LOW (ref 4.22–5.81)
RDW: 17.7 % — ABNORMAL HIGH (ref 11.5–15.5)
WBC: 14.1 10*3/uL — ABNORMAL HIGH (ref 4.0–10.5)
nRBC: 0.1 % (ref 0.0–0.2)

## 2023-12-21 LAB — MAGNESIUM
Magnesium: 2.4 mg/dL (ref 1.7–2.4)
Magnesium: 2.2 mg/dL (ref 1.7–2.4)

## 2023-12-21 MED ORDER — PANTOPRAZOLE SODIUM 40 MG PO TBEC
40.0000 mg | DELAYED_RELEASE_TABLET | Freq: Every day | ORAL | Status: DC
  Administered 2023-12-21 – 2023-12-24 (×4): 40 mg via ORAL
  Filled 2023-12-21 (×4): qty 1

## 2023-12-21 MED ORDER — CEFAZOLIN SODIUM-DEXTROSE 2-4 GM/100ML-% IV SOLN
2.0000 g | Freq: Two times a day (BID) | INTRAVENOUS | Status: AC
  Administered 2023-12-21 – 2023-12-22 (×2): 2 g via INTRAVENOUS
  Filled 2023-12-21 (×2): qty 100

## 2023-12-21 MED ORDER — INSULIN GLARGINE-YFGN 100 UNIT/ML ~~LOC~~ SOLN: 8.0000 [IU] | Freq: Every day | SUBCUTANEOUS | Status: DC

## 2023-12-21 MED ORDER — SORBITOL 70 % SOLN
30.0000 mL | Freq: Every day | Status: DC | PRN
  Administered 2023-12-21 – 2023-12-22 (×2): 30 mL via ORAL
  Filled 2023-12-21 (×2): qty 30

## 2023-12-21 MED ORDER — AMIODARONE HCL 200 MG PO TABS
200.0000 mg | ORAL_TABLET | Freq: Two times a day (BID) | ORAL | Status: DC
  Administered 2023-12-21 – 2023-12-24 (×7): 200 mg via ORAL
  Filled 2023-12-21 (×7): qty 1

## 2023-12-21 MED ORDER — INSULIN ASPART 100 UNIT/ML IJ SOLN
6.0000 [IU] | Freq: Once | INTRAMUSCULAR | Status: AC
Start: 2023-12-21 — End: 2023-12-21
  Administered 2023-12-21: 6 [IU] via SUBCUTANEOUS

## 2023-12-21 MED ORDER — BISACODYL 10 MG RE SUPP
10.0000 mg | Freq: Once | RECTAL | Status: AC
  Administered 2023-12-21: 10 mg via RECTAL
  Filled 2023-12-21: qty 1

## 2023-12-21 NOTE — Progress Notes (Signed)
 PHARMACY NOTE:  ANTIMICROBIAL RENAL DOSAGE ADJUSTMENT  Current antimicrobial regimen includes a mismatch between antimicrobial dosage and estimated renal function.  As per policy approved by the Pharmacy & Therapeutics and Medical Executive Committees, the antimicrobial dosage will be adjusted accordingly.  Current antimicrobial dosage:  cefazolin 2g IV Q8H   Indication: post-op surgical ppx  Renal Function:  Estimated Creatinine Clearance: 22.8 mL/min (A) (by C-G formula based on SCr of 3.16 mg/dL (H)). []      On intermittent HD, scheduled: []      On CRRT    Antimicrobial dosage has been changed to:  Cefazolin 2g IV Q12H - same number of doses ordered   Additional comments:   Thank you for allowing pharmacy to be a part of this patient's care.  Jani Gravel, PharmD Clinical Pharmacist  12/21/2023 8:51 PM

## 2023-12-21 NOTE — Progress Notes (Signed)
 [address]       [place] [address]             [phone]      1 Day Post-Op  Procedure(s) (LRB): SUBXYPHOID PERICARDIAL WINDOW WITH CONVERSION TO STERNOTOMY. REPAIR OF RV INJURY (N/A) TRANSESOPHAGEAL ECHOCARDIOGRAM (TEE) (N/A) DRAINAGE OF PERICARDIAL FLUID CARDIOVERSION   Total Length of Stay:  LOS: 1 day    SUBJECTIVE: Overall feels ok  Vitals:   12/21/23 0500 12/21/23 0600  BP:    Pulse: 72 74  Resp: 15 18  Temp: (!) 97.3 F (36.3 C) (!) 97.3 F (36.3 C)  SpO2: 100% 98%    Intake/Output      02/21 0701 02/22 0700 02/22 0701 02/23 0700   P.O. 0    I.V. (mL/kg) 1764.5 (18.2)    Blood 1260    IV Piggyback 1749.9    Total Intake(mL/kg) 4774.4 (49.2)    Urine (mL/kg/hr) 745 (0.3)    Other 1250    Blood 300    Chest Tube 1240    Total Output 3535    Net +1239.4             sodium chloride Stopped (12/21/23 0600)   amiodarone 30 mg/hr (12/21/23 0700)    ceFAZolin (ANCEF) IV Stopped (12/21/23 0534)    CBC    Component Value Date/Time   WBC 16.1 (H) 12/21/2023 0453   RBC 3.34 (L) 12/21/2023 0453   HGB 9.9 (L) 12/21/2023 0453   HGB 14.5 07/10/2023 1145   HCT 27.9 (L) 12/21/2023 0453   HCT 42.3 07/10/2023 1145   PLT 249 12/21/2023 0453   PLT 136 (L) 07/10/2023 1145   MCV 83.5 12/21/2023 0453   MCV 96 07/10/2023 1145   MCH 29.6 12/21/2023 0453   MCHC 35.5 12/21/2023 0453   RDW 17.7 (H) 12/21/2023 0453   RDW 12.6 07/10/2023 1145   LYMPHSABS 1.2 12/21/2023 0453   MONOABS 1.1 (H) 12/21/2023 0453   EOSABS 0.0 12/21/2023 0453   BASOSABS 0.0 12/21/2023 0453   CMP     Component Value Date/Time   NA 131 (L) 12/21/2023 0453   NA 139 07/10/2023 1145   K 4.5 12/21/2023 0453   CL 92 (L) 12/21/2023 0453   CO2 21 (L) 12/21/2023 0453   GLUCOSE 163 (H) 12/21/2023 0453   BUN 64 (H) 12/21/2023 0453   BUN 19 07/10/2023 1145   CREATININE 3.12 (H) 12/21/2023 0453   CALCIUM 8.7 (L) 12/21/2023 0453   PROT 5.6 (L) 12/21/2023 0453    PROT 7.3 07/10/2023 1145   ALBUMIN 3.0 (L) 12/21/2023 0453   ALBUMIN 5.1 (H) 07/10/2023 1145   AST 279 (H) 12/21/2023 0453   ALT 204 (H) 12/21/2023 0453   ALKPHOS 57 12/21/2023 0453   BILITOT 1.1 12/21/2023 0453   BILITOT 0.8 07/10/2023 1145   GFRNONAA 20 (L) 12/21/2023 0453   GFRAA >60 02/13/2017 0739   ABG    Component Value Date/Time   PHART 7.445 12/20/2023 1341   PCO2ART 28.3 (L) 12/20/2023 1341   PO2ART 71 (L) 12/20/2023 1341   HCO3 19.5 (L) 12/20/2023 1341   TCO2 20 (L) 12/20/2023 1341   ACIDBASEDEF 4.0 (H) 12/20/2023 1341   O2SAT 95 12/20/2023 1341   CBG (last 3)  Recent Labs    12/20/23 2348 12/21/23 0452 12/21/23 0741  GLUCAP 228* 172* 205*  Exam Lungs: clear Card:RR Ext: warm Neuro: intact Abd: distended Has not has significant BM since before surgery  ASSESSMENT: POD #1 Pericardial and pleural effusion drainage and resternotomy Hemodynamics appear good and still in NSR Will transition to po amiodarone Renal: Cr elevated secondary to hemodynamic compromise from tamponade   Hold on diuretics for now.  Pumonary: on nasal oxygen Will encourage ambulation and deep breathing Leave chest tubes today Hold on anticoagulation for now DC art line  Needs BM. Start sorbitol   Eugenio Hoes, MD 12/21/2023

## 2023-12-21 NOTE — Consult Note (Addendum)
 NAME:  Kristoff Coonradt, MRN:  [id number], DOB:  [birth year], LOS: 1 ADMISSION DATE:  12/19/2023, CONSULTATION DATE:  2/21 REFERRING MD:  Leafy Ro , CHIEF COMPLAINT:  critical care support post-op.    History of Present Illness:  78 year old male w/ h/o severe AS, CAD, HTN, dyslipidemia, prediabetes who presented to [facility] 2 weeks s/p 2V CABG and bioprosthetic AVR (he was discharged on day 6 post-op) post op course c/b AF (Was sinus at DC) and AKI w. Cr as high as 2.15 this also normalized prior to dc. Sent home on lasix X 5d, apixaban and amiodarone as well as BB.  About 3d PTA started to notice marked increase in shortness of breath (he initially noted this was about the same time he stopped the lasix).  Went to ER 2/20  In ER bedside ECHO obtained. This demonstrated large pericardial effusion. At this time there was no tamponade physiology. He was back in AD. On 2/21 he was brought to the ER for planned subxiphoid pericardial window & TEE.   In OR TEE was completed. This was neg for  thrombus and showed EF 65%. the sternal incision was opened and a large pericardial effusion was drained. During this time it was also noted there was some darker blood coming superior to site and under further inspection a small tear in the RV was noted.  Because of this the sternotomy had to be opened and sternal wires removed. The RV tear was then able to be repaired, and both sides of pleural drained w/ chest tubes placed in both spaces as well as two tubes placed in mediastinum.  The sternum was closed. He was extubated in OR and returned to ICU post op.  Intra op underwent synchronized cardioversion   EBL estimated at w/ another 1250 drained from pleural space.  Intra-op 2 units PRBC, 2 liters of crystalloid and 3 doses of albumin.  On arrival to ICU  Norepi at 68mcg/min, left mediastinal chest tube drained bloody pleural output. Right CT marked at 100 Hgb 7.4 (pre-op was 9.3) lowest intra-op  istat at 6.8.  No distress on simple facemask  PCCM asked to assist w/ post op care  Pertinent  Medical History  severe AS, CAD, HTN, dyslipidemia, prediabetes  2 weeks s/p 2V CABG and bioprosthetic AVR  Significant Hospital Events: Including procedures, antibiotic start and stop dates in addition to other pertinent events   2/21 s/p Attempted Subxyphoid window with conversion to redo sternotomy with repair of RV injury and drainage of large pericardial and pleural effusions and Synchronized Cardioversion  Interim History / Subjective:  Overnight  he remained off vasopressors. Chest tube output slowed and UOP improved.  Objective   Blood pressure 100/63, pulse 74, temperature (!) 97.3 F (36.3 C), resp. rate 18, height 5\' 8"  (1.727 m), weight 97.1 kg, SpO2 98%.        Intake/Output Summary (Last 24 hours) at 12/21/2023 0728 Last data filed at 12/21/2023 0700 Gross per 24 hour  Intake 4774.38 ml  Output 3535 ml  Net 1239.38 ml   Filed Weights   12/19/23 1214  Weight: 97.1 kg    Examination: General: ill appearing man sitting up in the chair in NAD HENT: [place]/AT, eyes anicteric Lungs: decreased basilar breath sounds, no significant rhales. No conversational dyspnea or accessory muscle use. Cardiovascular: S1S2,RRR. Chest tubes with bloody output, slow currently. Abdomen: mildly distended Extremities: warm, no significant edema Neuro: awake, alert, answering questions appropriately. Moving extremities.  GU: yellow urine  Chest tube output 1240, mostly during day shift UOP improved overnight (got lasix) Net +1.2L  CXR personally reviewed> chest tubes in place, small residual left effusion and atelectasis.  Na+  131 Bicarb 21 BUN 64 Cr 3.12 AST 279 ALT 204 T bili 1.1 WBC 16.1 H/H 9.9/27.9 Platelets 249  Resolved Hospital Problem list     Assessment & Plan:   CAD and S/p recent 2V CABG and severe AS s/p AVR w/ bioprosthetic valve complicated by post-op  inflammatory and hemorrhagic pericardial effusion and  L>R bilateral pleural effusions. Required pericardial window and redo sternotomy w/ drainage of large pericardial and bilateral pleural effusions and surgical repair of RV injury -post op antibiotics -post-op pain control per protocol -con't chest tubes today -holding AC today -progressive mobility and diet as tolerated today -maintain adequate renal perfusion   Shock,multifactorial-- had hemodynamically significant pericardial effusion causing hypotension with anesthesia induction, post-op vasoplegia, and hypovolemic shock from bleeding and rapid removal of effusions. Volume resuscitated. -maintain adequate perfusion; can add Bblcoker today -monitor chest tube output  Paroxysmal Afib, CHADS2-VASc score is 6 indicating a 9.7% annual risk of stroke.    -hold AC for now -amiodarone -tele monitoring  Recurrent AKI w/ hyponatremia, likely due to hypoperfusion from pericardial effusion, hypotension in OR -hold diuresis today -strict I/O -maintain adequate renal perfusion -expect this AKI to be slower to resolve than his last episode  Elevated transaminases, likely a component of shock liver -supportive care -maintain adequate perfusion  Expected post-operative anemia,s/p 4 units pRBC due to hemorrhagic effusion Consumptive coagulopathy, INR 2 today -no current need for transfusion; unless significant ongoing bleeding would not correct INR -monitor chest tube output  Leukocytosis (reactive) -monitor  Prediabetes w/ uncontrolled hyperglycemia; A1c 5.7. Received steroids in OR. -SSI PRN -semglee 8 units -goal BG <180  Constipation, present on admission. No BM since discharge from last admit. -sorbitol, dulcolax -may require enema later today  Best Practice (right click and "Reselect all SmartList Selections" daily)   Diet/type: Regular consistency (see orders) DVT prophylaxis SCD Pressure ulcer(s): N/A GI prophylaxis:  PPI Lines: Arterial Line Foley:  Yes, and it is still needed Code Status:  full code Last date of multidisciplinary goals of care discussion [per primary ]  Labs   CBC: Recent Labs  Lab 12/19/23 1228 12/19/23 1829 12/20/23 0935 12/20/23 1220 12/20/23 1341 12/20/23 1758 12/20/23 2000 12/21/23 0453  WBC 15.8* 16.8*  --   --  20.6*  --  17.5* 16.1*  NEUTROABS  --   --   --   --   --   --   --  13.6*  HGB 8.7* 9.3*   < > 6.8* 6.8*  7.4* 9.8* 10.0* 9.9*  HCT 25.8* 27.0*   < > 20.0* 20.0*  21.8* 27.7* 28.3* 27.9*  MCV 93.1 91.2  --   --  87.6  --  84.0 83.5  PLT 427* 428*  --   --  304  --  264 249   < > = values in this interval not displayed.    Basic Metabolic Panel: Recent Labs  Lab 12/19/23 1228 12/19/23 1829 12/20/23 0255 12/20/23 0935 12/20/23 1220 12/20/23 1341 12/20/23 1948 12/21/23 0453  NA 131*  --  128* 127* 129* 130* 128* 131*  K 4.6  --  4.6 5.1 4.9 5.2* 4.8 4.5  CL 89*  --  88* 92*  --   --  94* 92*  CO2 23  --  22  --   --   --  19* 21*  GLUCOSE 228*  --  178* 177*  --   --  192* 163*  BUN 41*  --  53* 58*  --   --  61* 64*  CREATININE 2.00*  --  2.35* 2.80*  --   --  2.90* 3.12*  CALCIUM 9.4  --  9.1  --   --   --  7.9* 8.7*  MG  --  2.5*  --   --   --   --  2.3 2.4   GFR: Estimated Creatinine Clearance: 23.1 mL/min (A) (by C-G formula based on SCr of 3.12 mg/dL (H)). Recent Labs  Lab 12/19/23 1829 12/20/23 1341 12/20/23 2000 12/21/23 0453  WBC 16.8* 20.6* 17.5* 16.1*    Liver Function Tests: Recent Labs  Lab 12/21/23 0453  AST 279*  ALT 204*  ALKPHOS 57  BILITOT 1.1  PROT 5.6*  ALBUMIN 3.0*    ABG    Component Value Date/Time   PHART 7.445 12/20/2023 1341   PCO2ART 28.3 (L) 12/20/2023 1341   PO2ART 71 (L) 12/20/2023 1341   HCO3 19.5 (L) 12/20/2023 1341   TCO2 20 (L) 12/20/2023 1341   ACIDBASEDEF 4.0 (H) 12/20/2023 1341   O2SAT 95 12/20/2023 1341      HbA1C: Hgb A1c MFr Bld  Date/Time Value Ref Range Status   12/04/2023 01:14 PM 5.7 (H) 4.8 - 5.6 % Final    Comment:    (NOTE) Pre diabetes:          5.7%-6.4%  Diabetes:              >6.4%  Glycemic control for   <7.0% adults with diabetes   07/05/2023 08:37 AM 5.8 4.6 - 6.5 % Final    Comment:    Glycemic Control Guidelines for People with Diabetes:Non Diabetic:  <6%Goal of Therapy: <7%Additional Action Suggested:  >8%     CBG: Recent Labs  Lab 12/20/23 1339 12/20/23 1551 12/20/23 1953 12/20/23 2348 12/21/23 0452  GLUCAP 194* 166* 192* 228* 172*    Critical care time:     Steffanie Dunn, DO 12/21/23 8:57 AM [facility]  For contact information, see Amion. If no response to pager, please call PCCM consult pager. After hours, 7PM- 7AM, please call Elink.

## 2023-12-22 DIAGNOSIS — I3139 Other pericardial effusion (noninflammatory): Secondary | ICD-10-CM | POA: Diagnosis not present

## 2023-12-22 DIAGNOSIS — J90 Pleural effusion, not elsewhere classified: Secondary | ICD-10-CM | POA: Diagnosis not present

## 2023-12-22 DIAGNOSIS — I4891 Unspecified atrial fibrillation: Secondary | ICD-10-CM | POA: Diagnosis not present

## 2023-12-22 DIAGNOSIS — R571 Hypovolemic shock: Secondary | ICD-10-CM | POA: Diagnosis not present

## 2023-12-22 LAB — BASIC METABOLIC PANEL
Anion gap: 15 (ref 5–15)
BUN: 72 mg/dL — ABNORMAL HIGH (ref 8–23)
CO2: 21 mmol/L — ABNORMAL LOW (ref 22–32)
Calcium: 7.8 mg/dL — ABNORMAL LOW (ref 8.9–10.3)
Chloride: 89 mmol/L — ABNORMAL LOW (ref 98–111)
Creatinine, Ser: 3.48 mg/dL — ABNORMAL HIGH (ref 0.61–1.24)
GFR, Estimated: 18 mL/min — ABNORMAL LOW (ref >60)
Glucose, Bld: 113 mg/dL — ABNORMAL HIGH (ref 70–99)
Potassium: 3.7 mmol/L (ref 3.5–5.1)
Sodium: 125 mmol/L — ABNORMAL LOW (ref 135–145)

## 2023-12-22 LAB — GLUCOSE, CAPILLARY
Glucose-Capillary: 105 mg/dL — ABNORMAL HIGH (ref 70–99)
Glucose-Capillary: 164 mg/dL — ABNORMAL HIGH (ref 70–99)
Glucose-Capillary: 178 mg/dL — ABNORMAL HIGH (ref 70–99)
Glucose-Capillary: 184 mg/dL — ABNORMAL HIGH (ref 70–99)
Glucose-Capillary: 192 mg/dL — ABNORMAL HIGH (ref 70–99)
Glucose-Capillary: 98 mg/dL (ref 70–99)

## 2023-12-22 LAB — CBC
HCT: 27.9 % — ABNORMAL LOW (ref 39.0–52.0)
Hemoglobin: 9.7 g/dL — ABNORMAL LOW (ref 13.0–17.0)
MCH: 29.2 pg (ref 26.0–34.0)
MCHC: 34.8 g/dL (ref 30.0–36.0)
MCV: 84 fL (ref 80.0–100.0)
Platelets: 226 10*3/uL (ref 150–400)
RBC: 3.32 MIL/uL — ABNORMAL LOW (ref 4.22–5.81)
RDW: 17.2 % — ABNORMAL HIGH (ref 11.5–15.5)
WBC: 11 10*3/uL — ABNORMAL HIGH (ref 4.0–10.5)
nRBC: 0 % (ref 0.0–0.2)

## 2023-12-22 LAB — MAGNESIUM: Magnesium: 2.5 mg/dL — ABNORMAL HIGH (ref 1.7–2.4)

## 2023-12-22 MED ORDER — INSULIN ASPART 100 UNIT/ML IJ SOLN
2.0000 [IU] | Freq: Three times a day (TID) | INTRAMUSCULAR | Status: DC
  Administered 2023-12-22 – 2023-12-23 (×4): 2 [IU] via SUBCUTANEOUS

## 2023-12-22 MED ORDER — MORPHINE SULFATE (PF) 2 MG/ML IV SOLN: 1.0000 mg | INTRAVENOUS | Status: DC | PRN

## 2023-12-22 MED ORDER — ATORVASTATIN CALCIUM 80 MG PO TABS
80.0000 mg | ORAL_TABLET | Freq: Every evening | ORAL | Status: DC
  Administered 2023-12-22 – 2023-12-23 (×2): 80 mg via ORAL
  Filled 2023-12-22 (×3): qty 1

## 2023-12-22 NOTE — Progress Notes (Addendum)
 NAME:  [name], MRN:  [id number], DOB:  04-10-1946, LOS: 2 ADMISSION DATE:  12/19/2023, CONSULTATION DATE:  2/21 REFERRING MD:  weldner , CHIEF COMPLAINT:  critical care support post-op.    History of Present Illness:  78 year old male w/ h/o severe AS, CAD, HTN, dyslipidemia, prediabetes who presented to [facility] 2 weeks s/p 2V CABG and bioprosthetic AVR (he was discharged on day 6 post-op) post op course c/b AF (Was sinus at DC) and AKI w. Cr as high as 2.15 this also normalized prior to dc. Sent home on lasix X 5d, apixaban and amiodarone as well as BB.  About 3d PTA started to notice marked increase in shortness of breath (he initially noted this was about the same time he stopped the lasix).  Went to ER 2/20  In ER bedside ECHO obtained. This demonstrated large pericardial effusion. At this time there was no tamponade physiology. He was back in AD. On 2/21 he was brought to the ER for planned subxiphoid pericardial window & TEE.   In OR TEE was completed. This was neg for  thrombus and showed EF 65%. the sternal incision was opened and a large pericardial effusion was drained. During this time it was also noted there was some darker blood coming superior to site and under further inspection a small tear in the RV was noted.  Because of this the sternotomy had to be opened and sternal wires removed. The RV tear was then able to be repaired, and both sides of pleural drained w/ chest tubes placed in both spaces as well as two tubes placed in mediastinum.  The sternum was closed. He was extubated in OR and returned to ICU post op.  Intra op underwent synchronized cardioversion   EBL estimated at w/ another 1250 drained from pleural space.  Intra-op 2 units PRBC, 2 liters of crystalloid and 3 doses of albumin.  On arrival to ICU  Norepi at 53mcg/min, left mediastinal chest tube drained bloody pleural output. Right CT marked at 100 Hgb 7.4 (pre-op was 9.3) lowest intra-op  istat at 6.8.  No distress on simple facemask  PCCM asked to assist w/ post op care  Pertinent  Medical History  severe AS, CAD, HTN, dyslipidemia, prediabetes  2 weeks s/p 2V CABG and bioprosthetic AVR  Significant Hospital Events: Including procedures, antibiotic start and stop dates in addition to other pertinent events   2/21 s/p Attempted Subxyphoid window with conversion to redo sternotomy with repair of RV injury and drainage of large pericardial and pleural effusions and Synchronized Cardioversion  Interim History / Subjective:  Feeling well overall, pain is adequately controlled. Moderate appetite.   Objective   Blood pressure 107/66, pulse 64, temperature 98 F (36.7 C), temperature source Oral, resp. rate 17, height 5\' 8"  (1.727 m), weight 97 kg, SpO2 96%.        Intake/Output Summary (Last 24 hours) at 12/22/2023 1307 Last data filed at 12/22/2023 1100 Gross per 24 hour  Intake 598.92 ml  Output 1062 ml  Net -463.08 ml   Filed Weights   12/19/23 1214 12/22/23 0500  Weight: 97.1 kg 97 kg    Examination: General: elderly man sitting up in the chair in NAD HENT: [place]/AT, eyes anicteric Lungs: bibasilar rhales, no conversational dyspnea Cardiovascular: S1S2, RRR. Mild chest tube output- getting more serous. Abdomen: soft, NT Extremities: warm, dry, minimal edema Neuro: awake, alert, moving all extremities  Chest tube output 380, mostly during day shift  UOP 812cc Net even for 24 hrs  CXR personally reviewed> chest tubes in place, small residual left effusion and atelectasis.  Na+  125 Bicarb 21 BUN 72 Cr 3.48 WBC 11 H/H 9.7/ 27.9 Platelets 226  Resolved Hospital Problem list    Shock,multifactorial-- had hemodynamically significant pericardial effusion causing hypotension with anesthesia induction, post-op vasoplegia, and hypovolemic shock from bleeding and rapid removal of effusions. Volume resuscitated.  Assessment & Plan:   CAD and s/p recent CABGx2  and severe AS s/p AVR w/ bioprosthetic valve complicated by post-op inflammatory and hemorrhagic pericardial effusion and  L>R bilateral pleural effusions. Required pericardial window and redo sternotomy w/ drainage of large pericardial and bilateral pleural effusions and surgical repair of RV injury -post-op pain control per protocol -con't tele monitoring -con't chest tubes per TCTS  -aspirin, statin, zetia -con't amiodarone 200mg  BID -progressive mobility, advance diet  Paroxysmal Afib, CHADS2-VASc score is 6, a/w 9.7% annual risk of stroke.    -amiodrone -hold full AC -tele monitoring  Recurrent AKI w/ hyponatremia, likely due to hypoperfusion from pericardial effusion, hypotension in OR- starting to plateau.  -strict I/O -renally dose meds, avoid nephrotoxic meds -hold off on diuresis for now -ensure adequate perfusion -expect this AKI to be slower to resolve than his last episode  Hyponatremia -avoid hypotonic fluids; prefer juice or other beverages today to water until his diet picks back up  Elevated transaminases, likely a component of shock liver -supportive care -maintain adequate perfusion  Expected post-operative ABLA,s/p 4 units pRBC due to hemorrhagic effusion Consumptive coagulopathy, INR 2 today -no current need for transfusion; unless significant ongoing bleeding would not correct INR -monitor chest tube output  Leukocytosis (reactive) -monitor  Prediabetes w/ uncontrolled hyperglycemia; A1c 5.7. Received steroids in OR. -SSI PRN -adding mealtime insulin aspart 2 units TIDAC -goal BG <180  Constipation, present on admission, resolved -con't bowel regimen  Hypothyroidism -con't synthroid  Patient and wife updated at bedside.    Best Practice (right click and "Reselect all SmartList Selections" daily)   Diet/type: Regular consistency (see orders) DVT prophylaxis SCD Pressure ulcer(s): N/A GI prophylaxis: N/A Lines: N/A Foley:  Yes, and it is  still needed Code Status:  full code Last date of multidisciplinary goals of care discussion [per primary ]  Labs   CBC: Recent Labs  Lab 12/20/23 1341 12/20/23 1758 12/20/23 2000 12/21/23 0453 12/21/23 1633 12/22/23 0340  WBC 20.6*  --  17.5* 16.1* 14.1* 11.0*  NEUTROABS  --   --   --  13.6*  --   --   HGB 6.8*  7.4* 9.8* 10.0* 9.9* 8.4* 9.7*  HCT 20.0*  21.8* 27.7* 28.3* 27.9* 24.0* 27.9*  MCV 87.6  --  84.0 83.5 85.7 84.0  PLT 304  --  264 249 250 226    Basic Metabolic Panel: Recent Labs  Lab 12/19/23 1829 12/20/23 0255 12/20/23 0935 12/20/23 1220 12/20/23 1341 12/20/23 1948 12/21/23 0453 12/21/23 1633 12/22/23 0340  NA  --  128* 127*   < > 130* 128* 131* 129* 125*  K  --  4.6 5.1   < > 5.2* 4.8 4.5 3.6 3.7  CL  --  88* 92*  --   --  94* 92* 95* 89*  CO2  --  22  --   --   --  19* 21* 18* 21*  GLUCOSE  --  178* 177*  --   --  192* 163* 123* 113*  BUN  --  53* 58*  --   --  61* 64* 65* 72*  CREATININE  --  2.35* 2.80*  --   --  2.90* 3.12* 3.16* 3.48*  CALCIUM  --  9.1  --   --   --  7.9* 8.7* 7.1* 7.8*  MG 2.5*  --   --   --   --  2.3 2.4 2.2 2.5*   < > = values in this interval not displayed.   GFR: Estimated Creatinine Clearance: 20.7 mL/min (A) (by C-G formula based on SCr of 3.48 mg/dL (H)). Recent Labs  Lab 12/20/23 2000 12/21/23 0453 12/21/23 1633 12/22/23 0340  WBC 17.5* 16.1* 14.1* 11.0*    Liver Function Tests: Recent Labs  Lab 12/21/23 0453  AST 279*  ALT 204*  ALKPHOS 57  BILITOT 1.1  PROT 5.6*  ALBUMIN 3.0*    ABG    Component Value Date/Time   PHART 7.445 12/20/2023 1341   PCO2ART 28.3 (L) 12/20/2023 1341   PO2ART 71 (L) 12/20/2023 1341   HCO3 19.5 (L) 12/20/2023 1341   TCO2 20 (L) 12/20/2023 1341   ACIDBASEDEF 4.0 (H) 12/20/2023 1341   O2SAT 95 12/20/2023 1341      HbA1C: Hgb A1c MFr Bld  Date/Time Value Ref Range Status  12/04/2023 01:14 PM 5.7 (H) 4.8 - 5.6 % Final    Comment:    (NOTE) Pre diabetes:           5.7%-6.4%  Diabetes:              >6.4%  Glycemic control for   <7.0% adults with diabetes   07/05/2023 08:37 AM 5.8 4.6 - 6.5 % Final    Comment:    Glycemic Control Guidelines for People with Diabetes:Non Diabetic:  <6%Goal of Therapy: <7%Additional Action Suggested:  >8%     CBG: Recent Labs  Lab 12/21/23 1929 12/21/23 2349 12/22/23 0424 12/22/23 0738 12/22/23 1127  GLUCAP 148* 136* 105* 184* 164*    Critical care time:     Steffanie Dunn, DO 12/22/23 2:00 PM [facility]  For contact information, see Amion. If no response to pager, please call PCCM consult pager. After hours, 7PM- 7AM, please call Elink.

## 2023-12-22 NOTE — Plan of Care (Signed)
  Problem: Education: Goal: Ability to describe self-care measures that may prevent or decrease complications (Diabetes Survival Skills Education) will improve Outcome: Progressing Goal: Individualized Educational Video(s) Outcome: Progressing   Problem: Coping: Goal: Ability to adjust to condition or change in health will improve Outcome: Progressing   Problem: Fluid Volume: Goal: Ability to maintain a balanced intake and output will improve Outcome: Progressing   Problem: Health Behavior/Discharge Planning: Goal: Ability to identify and utilize available resources and services will improve Outcome: Progressing Goal: Ability to manage health-related needs will improve Outcome: Progressing   Problem: Metabolic: Goal: Ability to maintain appropriate glucose levels will improve Outcome: Progressing   Problem: Nutritional: Goal: Maintenance of adequate nutrition will improve Outcome: Progressing Goal: Progress toward achieving an optimal weight will improve Outcome: Progressing   Problem: Skin Integrity: Goal: Risk for impaired skin integrity will decrease Outcome: Progressing   Problem: Tissue Perfusion: Goal: Adequacy of tissue perfusion will improve Outcome: Progressing   Problem: Education: Goal: Knowledge of General Education information will improve Description: Including pain rating scale, medication(s)/side effects and non-pharmacologic comfort measures Outcome: Progressing   Problem: Health Behavior/Discharge Planning: Goal: Ability to manage health-related needs will improve Outcome: Progressing   Problem: Clinical Measurements: Goal: Ability to maintain clinical measurements within normal limits will improve Outcome: Progressing Goal: Will remain free from infection Outcome: Progressing Goal: Diagnostic test results will improve Outcome: Progressing Goal: Respiratory complications will improve Outcome: Progressing Goal: Cardiovascular complication will  be avoided Outcome: Progressing   Problem: Activity: Goal: Risk for activity intolerance will decrease Outcome: Progressing   Problem: Nutrition: Goal: Adequate nutrition will be maintained Outcome: Progressing   Problem: Coping: Goal: Level of anxiety will decrease Outcome: Progressing   Problem: Elimination: Goal: Will not experience complications related to bowel motility Outcome: Progressing Goal: Will not experience complications related to urinary retention Outcome: Progressing   Problem: Pain Managment: Goal: General experience of comfort will improve and/or be controlled Outcome: Progressing   Problem: Safety: Goal: Ability to remain free from injury will improve Outcome: Progressing   Problem: Skin Integrity: Goal: Risk for impaired skin integrity will decrease Outcome: Progressing   Problem: Education: Goal: Knowledge of disease or condition will improve Outcome: Progressing Goal: Understanding of medication regimen will improve Outcome: Progressing Goal: Individualized Educational Video(s) Outcome: Progressing   Problem: Activity: Goal: Ability to tolerate increased activity will improve Outcome: Progressing   Problem: Cardiac: Goal: Ability to achieve and maintain adequate cardiopulmonary perfusion will improve Outcome: Progressing   Problem: Health Behavior/Discharge Planning: Goal: Ability to safely manage health-related needs after discharge will improve Outcome: Progressing   Problem: Education: Goal: Will demonstrate proper wound care and an understanding of methods to prevent future damage Outcome: Progressing Goal: Knowledge of disease or condition will improve Outcome: Progressing Goal: Knowledge of the prescribed therapeutic regimen will improve Outcome: Progressing Goal: Individualized Educational Video(s) Outcome: Progressing   Problem: Activity: Goal: Risk for activity intolerance will decrease Outcome: Progressing   Problem:  Cardiac: Goal: Will achieve and/or maintain hemodynamic stability Outcome: Progressing   Problem: Clinical Measurements: Goal: Postoperative complications will be avoided or minimized Outcome: Progressing   Problem: Respiratory: Goal: Respiratory status will improve Outcome: Progressing   Problem: Skin Integrity: Goal: Wound healing without signs and symptoms of infection Outcome: Progressing Goal: Risk for impaired skin integrity will decrease Outcome: Progressing   Problem: Urinary Elimination: Goal: Ability to achieve and maintain adequate renal perfusion and functioning will improve Outcome: Progressing

## 2023-12-22 NOTE — Op Note (Signed)
 [address]       [place] [address]             [phone]      2 Days Post-Op  Procedure(s) (LRB): SUBXYPHOID PERICARDIAL WINDOW WITH CONVERSION TO STERNOTOMY. REPAIR OF RV INJURY (N/A) TRANSESOPHAGEAL ECHOCARDIOGRAM (TEE) (N/A) DRAINAGE OF PERICARDIAL FLUID CARDIOVERSION   Total Length of Stay:  LOS: 2 days    SUBJECTIVE: Feels well Ambulated in halls  Vitals:   12/22/23 0600 12/22/23 0705  BP: 111/63 (!) 123/53  Pulse: 66 70  Resp: 15 15  Temp:    SpO2: 98% 98%    Intake/Output      02/22 0701 02/23 0700 02/23 0701 02/24 0700   P.O. 960    I.V. (mL/kg) 86.6 (0.9)    Blood     IV Piggyback 200    Total Intake(mL/kg) 1246.6 (12.9)    Urine (mL/kg/hr) 812 (0.3)    Other     Stool 0    Blood     Chest Tube 380    Total Output 1192    Net +54.6         Stool Occurrence 3 x         ceFAZolin (ANCEF) IV Stopped (12/21/23 2141)    CBC    Component Value Date/Time   WBC 11.0 (H) 12/22/2023 0340   RBC 3.32 (L) 12/22/2023 0340   HGB 9.7 (L) 12/22/2023 0340   HGB 14.5 07/10/2023 1145   HCT 27.9 (L) 12/22/2023 0340   HCT 42.3 07/10/2023 1145   PLT 226 12/22/2023 0340   PLT 136 (L) 07/10/2023 1145   MCV 84.0 12/22/2023 0340   MCV 96 07/10/2023 1145   MCH 29.2 12/22/2023 0340   MCHC 34.8 12/22/2023 0340   RDW 17.2 (H) 12/22/2023 0340   RDW 12.6 07/10/2023 1145   LYMPHSABS 1.2 12/21/2023 0453   MONOABS 1.1 (H) 12/21/2023 0453   EOSABS 0.0 12/21/2023 0453   BASOSABS 0.0 12/21/2023 0453   CMP     Component Value Date/Time   NA 125 (L) 12/22/2023 0340   NA 139 07/10/2023 1145   K 3.7 12/22/2023 0340   CL 89 (L) 12/22/2023 0340   CO2 21 (L) 12/22/2023 0340   GLUCOSE 113 (H) 12/22/2023 0340   BUN 72 (H) 12/22/2023 0340   BUN 19 07/10/2023 1145   CREATININE 3.48 (H) 12/22/2023 0340   CALCIUM 7.8 (L) 12/22/2023 0340   PROT 5.6 (L) 12/21/2023 0453   PROT 7.3 07/10/2023 1145   ALBUMIN 3.0 (L) 12/21/2023 0453   ALBUMIN  5.1 (H) 07/10/2023 1145   AST 279 (H) 12/21/2023 0453   ALT 204 (H) 12/21/2023 0453   ALKPHOS 57 12/21/2023 0453   BILITOT 1.1 12/21/2023 0453   BILITOT 0.8 07/10/2023 1145   GFRNONAA 18 (L) 12/22/2023 0340   GFRAA >60 02/13/2017 0739   ABG    Component Value Date/Time   PHART 7.445 12/20/2023 1341   PCO2ART 28.3 (L) 12/20/2023 1341   PO2ART 71 (L) 12/20/2023 1341   HCO3 19.5 (L) 12/20/2023 1341   TCO2 20 (L) 12/20/2023 1341   ACIDBASEDEF 4.0 (H) 12/20/2023 1341   O2SAT 95 12/20/2023 1341   CBG (last 3)  Recent Labs    12/21/23 2349 12/22/23 0424 12/22/23 0738  GLUCAP 136* 105* 184*  EXAM Lungs: overall clear Card: RR Ext: warm Neuro: intact   ASSESSMENT: SP drainage of pericardial and pleural effusions Hemodynamics ok  Pulmonary; on  room air  will remove chest tubes From mediastinum.Marland Kitchenleave pleural tubes today Renal: continues with hopefully leveling off of rising cr. Will hold diuretics.  Low Na. Will restrict free water Continue to hold anticoagulation  Ext   Eugenio Hoes, MD 12/22/2023

## 2023-12-23 ENCOUNTER — Ambulatory Visit: Payer: Medicare HMO

## 2023-12-23 DIAGNOSIS — I4891 Unspecified atrial fibrillation: Secondary | ICD-10-CM | POA: Diagnosis not present

## 2023-12-23 DIAGNOSIS — I3139 Other pericardial effusion (noninflammatory): Secondary | ICD-10-CM | POA: Diagnosis not present

## 2023-12-23 DIAGNOSIS — J90 Pleural effusion, not elsewhere classified: Secondary | ICD-10-CM | POA: Diagnosis not present

## 2023-12-23 DIAGNOSIS — R571 Hypovolemic shock: Secondary | ICD-10-CM | POA: Diagnosis not present

## 2023-12-23 LAB — BPAM RBC
Blood Product Expiration Date: 202503122359
Blood Product Expiration Date: 202503122359
Blood Product Expiration Date: 202503122359
Blood Product Expiration Date: 202503122359
Blood Product Expiration Date: 202503132359
Blood Product Expiration Date: 202503132359
ISSUE DATE / TIME: 202502211219
ISSUE DATE / TIME: 202502211219
ISSUE DATE / TIME: 202502211402
ISSUE DATE / TIME: 202502211508
Unit Type and Rh: 5100
Unit Type and Rh: 5100
Unit Type and Rh: 5100
Unit Type and Rh: 5100
Unit Type and Rh: 5100
Unit Type and Rh: 5100

## 2023-12-23 LAB — GLUCOSE, CAPILLARY
Glucose-Capillary: 107 mg/dL — ABNORMAL HIGH (ref 70–99)
Glucose-Capillary: 146 mg/dL — ABNORMAL HIGH (ref 70–99)
Glucose-Capillary: 148 mg/dL — ABNORMAL HIGH (ref 70–99)
Glucose-Capillary: 135 mg/dL — ABNORMAL HIGH (ref 70–99)
Glucose-Capillary: 153 mg/dL — ABNORMAL HIGH (ref 70–99)
Glucose-Capillary: 115 mg/dL — ABNORMAL HIGH (ref 70–99)

## 2023-12-23 LAB — TYPE AND SCREEN
ABO/RH(D): O POS
Antibody Screen: NEGATIVE
Unit division: 0
Unit division: 0
Unit division: 0
Unit division: 0
Unit division: 0
Unit division: 0

## 2023-12-23 LAB — CBC
HCT: 25.2 % — ABNORMAL LOW (ref 39.0–52.0)
Hemoglobin: 8.8 g/dL — ABNORMAL LOW (ref 13.0–17.0)
MCH: 29.4 pg (ref 26.0–34.0)
MCHC: 34.9 g/dL (ref 30.0–36.0)
MCV: 84.3 fL (ref 80.0–100.0)
Platelets: 215 10*3/uL (ref 150–400)
RBC: 2.99 MIL/uL — ABNORMAL LOW (ref 4.22–5.81)
RDW: 16.7 % — ABNORMAL HIGH (ref 11.5–15.5)
WBC: 7.9 10*3/uL (ref 4.0–10.5)
nRBC: 0 % (ref 0.0–0.2)

## 2023-12-23 LAB — BASIC METABOLIC PANEL
Anion gap: 13 (ref 5–15)
BUN: 67 mg/dL — ABNORMAL HIGH (ref 8–23)
CO2: 21 mmol/L — ABNORMAL LOW (ref 22–32)
Calcium: 7.7 mg/dL — ABNORMAL LOW (ref 8.9–10.3)
Chloride: 92 mmol/L — ABNORMAL LOW (ref 98–111)
Creatinine, Ser: 3.05 mg/dL — ABNORMAL HIGH (ref 0.61–1.24)
GFR, Estimated: 21 mL/min — ABNORMAL LOW (ref >60)
Glucose, Bld: 108 mg/dL — ABNORMAL HIGH (ref 70–99)
Potassium: 3.5 mmol/L (ref 3.5–5.1)
Sodium: 126 mmol/L — ABNORMAL LOW (ref 135–145)

## 2023-12-23 LAB — MAGNESIUM: Magnesium: 2.4 mg/dL (ref 1.7–2.4)

## 2023-12-23 MED ORDER — SODIUM CHLORIDE 0.9% FLUSH: 3.0000 mL | INTRAVENOUS | Status: DC | PRN

## 2023-12-23 MED ORDER — SODIUM CHLORIDE 0.9 % IV SOLN: 250.0000 mL | INTRAVENOUS | Status: DC | PRN

## 2023-12-23 MED ORDER — SODIUM CHLORIDE 0.9% FLUSH
3.0000 mL | Freq: Two times a day (BID) | INTRAVENOUS | Status: DC
Start: 2023-12-23 — End: 2023-12-23

## 2023-12-23 MED ORDER — ~~LOC~~ CARDIAC SURGERY, PATIENT & FAMILY EDUCATION: Freq: Once | Status: AC

## 2023-12-23 MED ORDER — POTASSIUM CHLORIDE CRYS ER 20 MEQ PO TBCR
40.0000 meq | EXTENDED_RELEASE_TABLET | Freq: Once | ORAL | Status: AC
  Administered 2023-12-23: 40 meq via ORAL
  Filled 2023-12-23: qty 2

## 2023-12-23 MED ORDER — ENOXAPARIN SODIUM 30 MG/0.3ML IJ SOSY
30.0000 mg | PREFILLED_SYRINGE | Freq: Every day | INTRAMUSCULAR | Status: DC
  Administered 2023-12-23: 30 mg via SUBCUTANEOUS
  Filled 2023-12-23: qty 0.3

## 2023-12-23 NOTE — Progress Notes (Signed)
 NAME:  [name], MRN:  [id number], DOB:  1946/04/14, LOS: 3 ADMISSION DATE:  12/19/2023, CONSULTATION DATE:  2/21 REFERRING MD:  weldner , CHIEF COMPLAINT:  critical care support post-op.    History of Present Illness:  78 year old male w/ h/o severe AS, CAD, HTN, dyslipidemia, prediabetes who presented to [facility] 2 weeks s/p 2V CABG and bioprosthetic AVR (he was discharged on day 6 post-op) post op course c/b AF (Was sinus at DC) and AKI w. Cr as high as 2.15 this also normalized prior to dc. Sent home on lasix X 5d, apixaban and amiodarone as well as BB.  About 3d PTA started to notice marked increase in shortness of breath (he initially noted this was about the same time he stopped the lasix).  Went to ER 2/20  In ER bedside ECHO obtained. This demonstrated large pericardial effusion. At this time there was no tamponade physiology. He was back in AD. On 2/21 he was brought to the ER for planned subxiphoid pericardial window & TEE.   In OR TEE was completed. This was neg for  thrombus and showed EF 65%. the sternal incision was opened and a large pericardial effusion was drained. During this time it was also noted there was some darker blood coming superior to site and under further inspection a small tear in the RV was noted.  Because of this the sternotomy had to be opened and sternal wires removed. The RV tear was then able to be repaired, and both sides of pleural drained w/ chest tubes placed in both spaces as well as two tubes placed in mediastinum.  The sternum was closed. He was extubated in OR and returned to ICU post op.  Intra op underwent synchronized cardioversion   EBL estimated at w/ another 1250 drained from pleural space.  Intra-op 2 units PRBC, 2 liters of crystalloid and 3 doses of albumin.  On arrival to ICU  Norepi at 51mcg/min, left mediastinal chest tube drained bloody pleural output. Right CT marked at 100 Hgb 7.4 (pre-op was 9.3) lowest intra-op  istat at 6.8.  No distress on simple facemask  PCCM asked to assist w/ post op care  Pertinent  Medical History  severe AS, CAD, HTN, dyslipidemia, prediabetes  2 weeks s/p 2V CABG and bioprosthetic AVR  Significant Hospital Events: Including procedures, antibiotic start and stop dates in addition to other pertinent events   2/21 s/p Attempted Subxyphoid window with conversion to redo sternotomy with repair of RV injury and drainage of large pericardial and pleural effusions and Synchronized Cardioversion  Interim History / Subjective:  Feels well, no complaints.  Objective   Blood pressure 108/62, pulse 69, temperature 97.9 F (36.6 C), temperature source Oral, resp. rate 19, height 5\' 8"  (1.727 m), weight 96 kg, SpO2 96%.        Intake/Output Summary (Last 24 hours) at 12/23/2023 0727 Last data filed at 12/23/2023 0600 Gross per 24 hour  Intake 580 ml  Output 1950 ml  Net -1370 ml   Filed Weights   12/19/23 1214 12/22/23 0500 12/23/23 0500  Weight: 97.1 kg 97 kg 96 kg    Examination: General: elderly man sitting up in the recliner in NAD HENT: [place]/AT, eyes anicteric Lungs: CTAB, breathing comfortably on RA Cardiovascular: S1S2, RRR.Mild chest tube output Abdomen: soft, NT Extremities: no peripheral edema Neuro: awake, alert, moving all extremities  Chest tube output 290, mostly during day shift UOP 1660  Net -1.4L  for  24 hrs  Na+  126 K+ 3.5 Bicarb 21 BUN 67 Cr 3.05 WBC 7.9 H/H  8.8/25.2 Platelets 215  Resolved Hospital Problem list    Shock,multifactorial-- had hemodynamically significant pericardial effusion causing hypotension with anesthesia induction, post-op vasoplegia, and hypovolemic shock from bleeding and rapid removal of effusions. Volume resuscitated.  Assessment & Plan:   CAD and s/p recent CABGx2 and severe AS s/p AVR w/ bioprosthetic valve complicated by post-op inflammatory and hemorrhagic pericardial effusion and  L>R bilateral pleural  effusions. Required pericardial window and redo sternotomy w/ drainage of large pericardial and bilateral pleural effusions and surgical repair of RV injury -chest tubes out today per TCTS -aspirin, statin, zetia -hold AC -con't amiodarone BID -progressive mobility, advancing diet  Paroxysmal Afib, CHADS2-VASc score 6, a/w 9.7% annual risk of stroke.    -amiodarone BID -hold full AC for now -tele monitoring  Recurrent AKI w/ hyponatremia, likely due to hypoperfusion from pericardial effusion, hypotension in OR- starting to immprove -strict I/O -renally dose meds, avoid nephrotoxic meds -maintain  adequate perfusion  Hyponatremia -avoid hypotonic fluids -monitor -he is self-diuresing  Elevated transaminases, likely a component of shock liver -maintain adequate perfusion  Expected post-operative ABLA,s/p 4 units pRBC due to hemorrhagic effusion Consumptive coagulopathy, INR 2 today -not currently needing transfusion, con't to monitor CBC -chest tubes out today  Leukocytosis (reactive), resolved  Prediabetes w/ controlled hyperglycemia; A1c 5.7. Received steroids in OR. -SSI PRN -aspart 2 units TIDAC  -goal BG <180  Constipation, present on admission, resolved -bowel regimen  Hypothyroidism -synthroid  Patient updated at bedside. Stable to transfer to the floor. PCCM will be available as needed.    Best Practice (right click and "Reselect all SmartList Selections" daily)   Diet/type: Regular consistency (see orders) DVT prophylaxis LMWH Pressure ulcer(s): N/A GI prophylaxis: N/A Lines: N/A Foley:  Yes, and it is still needed Code Status:  full code Last date of multidisciplinary goals of care discussion [per primary ]  Labs   CBC: Recent Labs  Lab 12/20/23 2000 12/21/23 0453 12/21/23 1633 12/22/23 0340 12/23/23 0339  WBC 17.5* 16.1* 14.1* 11.0* 7.9  NEUTROABS  --  13.6*  --   --   --   HGB 10.0* 9.9* 8.4* 9.7* 8.8*  HCT 28.3* 27.9* 24.0* 27.9* 25.2*   MCV 84.0 83.5 85.7 84.0 84.3  PLT 264 249 250 226 215    Basic Metabolic Panel: Recent Labs  Lab 12/20/23 1948 12/21/23 0453 12/21/23 1633 12/22/23 0340 12/23/23 0339  NA 128* 131* 129* 125* 126*  K 4.8 4.5 3.6 3.7 3.5  CL 94* 92* 95* 89* 92*  CO2 19* 21* 18* 21* 21*  GLUCOSE 192* 163* 123* 113* 108*  BUN 61* 64* 65* 72* 67*  CREATININE 2.90* 3.12* 3.16* 3.48* 3.05*  CALCIUM 7.9* 8.7* 7.1* 7.8* 7.7*  MG 2.3 2.4 2.2 2.5* 2.4   GFR: Estimated Creatinine Clearance: 23.5 mL/min (A) (by C-G formula based on SCr of 3.05 mg/dL (H)). Recent Labs  Lab 12/21/23 0453 12/21/23 1633 12/22/23 0340 12/23/23 0339  WBC 16.1* 14.1* 11.0* 7.9    Liver Function Tests: Recent Labs  Lab 12/21/23 0453  AST 279*  ALT 204*  ALKPHOS 57  BILITOT 1.1  PROT 5.6*  ALBUMIN 3.0*    ABG    Component Value Date/Time   PHART 7.445 12/20/2023 1341   PCO2ART 28.3 (L) 12/20/2023 1341   PO2ART 71 (L) 12/20/2023 1341   HCO3 19.5 (L) 12/20/2023 1341   TCO2 20 (L)  12/20/2023 1341   ACIDBASEDEF 4.0 (H) 12/20/2023 1341   O2SAT 95 12/20/2023 1341      HbA1C: Hgb A1c MFr Bld  Date/Time Value Ref Range Status  12/04/2023 01:14 PM 5.7 (H) 4.8 - 5.6 % Final    Comment:    (NOTE) Pre diabetes:          5.7%-6.4%  Diabetes:              >6.4%  Glycemic control for   <7.0% adults with diabetes   07/05/2023 08:37 AM 5.8 4.6 - 6.5 % Final    Comment:    Glycemic Control Guidelines for People with Diabetes:Non Diabetic:  <6%Goal of Therapy: <7%Additional Action Suggested:  >8%     CBG: Recent Labs  Lab 12/22/23 1127 12/22/23 1526 12/22/23 1914 12/22/23 2343 12/23/23 0338  GLUCAP 164* 192* 178* 98 107*    Critical care time:     Steffanie Dunn, DO 12/23/23 9:23 AM [facility]  For contact information, see Amion. If no response to pager, please call PCCM consult pager. After hours, 7PM- 7AM, please call Elink.

## 2023-12-23 NOTE — Progress Notes (Addendum)
 [address]       [place] [address]             [phone]      3 Days Post-Op Procedure(s) (LRB): SUBXYPHOID PERICARDIAL WINDOW WITH CONVERSION TO STERNOTOMY. REPAIR OF RV INJURY (N/A) TRANSESOPHAGEAL ECHOCARDIOGRAM (TEE) (N/A) DRAINAGE OF PERICARDIAL FLUID CARDIOVERSION Subjective: Patient sitting up in the chair after eating some breakfast. Admits to some shortness of breath after his walk.   Objective: Vital signs in last 24 hours: Temp:  [97.6 F (36.4 C)-98 F (36.7 C)] 97.9 F (36.6 C) (02/24 0400) Pulse Rate:  [60-70] 69 (02/24 0700) Cardiac Rhythm: Normal sinus rhythm (02/23 2000) Resp:  [11-24] 19 (02/24 0700) BP: (89-111)/(52-70) 108/62 (02/24 0700) SpO2:  [94 %-98 %] 96 % (02/24 0700) Weight:  [96 kg] 96 kg (02/24 0500)  Hemodynamic parameters for last 24 hours:    Intake/Output from previous day: 02/23 0701 - 02/24 0700 In: 580 [P.O.:480; IV Piggyback:100] Out: 1950 [Urine:1660; Chest Tube:290] Intake/Output this shift: No intake/output data recorded.  General appearance: alert, cooperative, and no distress Neurologic: intact Heart: regular rate and rhythm, S1, S2 normal, no murmur, click, rub or gallop Lungs: clear to auscultation bilaterally Abdomen: soft, non-tender; bowel sounds normal; no masses,  no organomegaly Extremities: edema trace Wound: Clean and dry dressing in place  Lab Results: Recent Labs    12/22/23 0340 12/23/23 0339  WBC 11.0* 7.9  HGB 9.7* 8.8*  HCT 27.9* 25.2*  PLT 226 215   BMET:  Recent Labs    12/22/23 0340 12/23/23 0339  NA 125* 126*  K 3.7 3.5  CL 89* 92*  CO2 21* 21*  GLUCOSE 113* 108*  BUN 72* 67*  CREATININE 3.48* 3.05*  CALCIUM 7.8* 7.7*    PT/INR:  Recent Labs    12/21/23 0749  LABPROT 22.5*  INR 2.0*   ABG    Component Value Date/Time   PHART 7.445 12/20/2023 1341   HCO3 19.5 (L) 12/20/2023 1341   TCO2 20 (L) 12/20/2023 1341   ACIDBASEDEF 4.0 (H) 12/20/2023 1341    O2SAT 95 12/20/2023 1341   CBG (last 3)  Recent Labs    12/22/23 1914 12/22/23 2343 12/23/23 0338  GLUCAP 178* 98 107*    Assessment/Plan: S/P Procedure(s) (LRB): SUBXYPHOID PERICARDIAL WINDOW WITH CONVERSION TO STERNOTOMY. REPAIR OF RV INJURY (N/A) TRANSESOPHAGEAL ECHOCARDIOGRAM (TEE) (N/A) DRAINAGE OF PERICARDIAL FLUID CARDIOVERSION  Neuro: Pain controlled  CV: SBP 108 this AM. BP soft at times. NSR, HR 60-70s. On Amiodarone 200mg  BID for postop afib and Lopressor 50mg  BID.  Pulm: Saturating 90s on RA. Mediastinal CTs removed yesterday, pleural tubes still in place. CT output 290cc/24hrs, 70cc last shift. D/C CTs vs leave in place for now? Last CXR 2/22 with bibasilar atelectasis and likely small left pleural effusion. Will get follow up CXR tomorrow AM.   GI: +BM yesterday, appetite is improving. No nausea or vomiting.   Endo: Hypothyroid, on Synthroid daily. Stable mild hyponatremia, Na 126. Likely due to fluid administration, continue restricting free water. Holding diuresis due to elevated creatinine. CBGs 178/98/107, on Novolog 2U with meals and SSI PRN. Received steroids in the OR. Hx of prediabetes, preop A1C 5.7, not on meds at home.   Renal: AKI improved, Cr 3.05 this AM down from 3.48 yesterday. UO 1660cc/24hrs. Under preop weight. K 3.5, supplement.   ID: Leukocytosis resolved, afebrile.   Expected postop ABLA: H/H 8.8/25.2, not clinically significant at this time.  DVT Prophylaxis: Start Lovenox?  Anticoagulation: Only ASA 81mg  for now per Dr. Leafy Ro  Dispo: Transfer to 4E per Dr. Leafy Ro   LOS: 3 days    Jenny Reichmann, PA-C 12/23/2023

## 2023-12-23 NOTE — Plan of Care (Signed)
  Problem: Education: Goal: Ability to describe self-care measures that may prevent or decrease complications (Diabetes Survival Skills Education) will improve Outcome: Progressing Goal: Individualized Educational Video(s) Outcome: Progressing   Problem: Coping: Goal: Ability to adjust to condition or change in health will improve Outcome: Progressing   Problem: Fluid Volume: Goal: Ability to maintain a balanced intake and output will improve Outcome: Progressing   Problem: Health Behavior/Discharge Planning: Goal: Ability to identify and utilize available resources and services will improve Outcome: Progressing Goal: Ability to manage health-related needs will improve Outcome: Progressing   Problem: Metabolic: Goal: Ability to maintain appropriate glucose levels will improve Outcome: Progressing   Problem: Nutritional: Goal: Maintenance of adequate nutrition will improve Outcome: Progressing Goal: Progress toward achieving an optimal weight will improve Outcome: Progressing   Problem: Skin Integrity: Goal: Risk for impaired skin integrity will decrease Outcome: Progressing   Problem: Tissue Perfusion: Goal: Adequacy of tissue perfusion will improve Outcome: Progressing   Problem: Education: Goal: Knowledge of General Education information will improve Description: Including pain rating scale, medication(s)/side effects and non-pharmacologic comfort measures Outcome: Progressing   Problem: Health Behavior/Discharge Planning: Goal: Ability to manage health-related needs will improve Outcome: Progressing   Problem: Clinical Measurements: Goal: Ability to maintain clinical measurements within normal limits will improve Outcome: Progressing Goal: Will remain free from infection Outcome: Progressing Goal: Diagnostic test results will improve Outcome: Progressing Goal: Respiratory complications will improve Outcome: Progressing Goal: Cardiovascular complication will  be avoided Outcome: Progressing   Problem: Activity: Goal: Risk for activity intolerance will decrease Outcome: Progressing   Problem: Nutrition: Goal: Adequate nutrition will be maintained Outcome: Progressing   Problem: Coping: Goal: Level of anxiety will decrease Outcome: Progressing   Problem: Elimination: Goal: Will not experience complications related to bowel motility Outcome: Progressing Goal: Will not experience complications related to urinary retention Outcome: Progressing   Problem: Pain Managment: Goal: General experience of comfort will improve and/or be controlled Outcome: Progressing   Problem: Safety: Goal: Ability to remain free from injury will improve Outcome: Progressing   Problem: Skin Integrity: Goal: Risk for impaired skin integrity will decrease Outcome: Progressing   Problem: Education: Goal: Knowledge of disease or condition will improve Outcome: Progressing Goal: Understanding of medication regimen will improve Outcome: Progressing Goal: Individualized Educational Video(s) Outcome: Progressing   Problem: Activity: Goal: Ability to tolerate increased activity will improve Outcome: Progressing   Problem: Cardiac: Goal: Ability to achieve and maintain adequate cardiopulmonary perfusion will improve Outcome: Progressing   Problem: Health Behavior/Discharge Planning: Goal: Ability to safely manage health-related needs after discharge will improve Outcome: Progressing   Problem: Education: Goal: Will demonstrate proper wound care and an understanding of methods to prevent future damage Outcome: Progressing Goal: Knowledge of disease or condition will improve Outcome: Progressing Goal: Knowledge of the prescribed therapeutic regimen will improve Outcome: Progressing Goal: Individualized Educational Video(s) Outcome: Progressing   Problem: Activity: Goal: Risk for activity intolerance will decrease Outcome: Progressing   Problem:  Cardiac: Goal: Will achieve and/or maintain hemodynamic stability Outcome: Progressing   Problem: Clinical Measurements: Goal: Postoperative complications will be avoided or minimized Outcome: Progressing   Problem: Respiratory: Goal: Respiratory status will improve Outcome: Progressing   Problem: Skin Integrity: Goal: Wound healing without signs and symptoms of infection Outcome: Progressing Goal: Risk for impaired skin integrity will decrease Outcome: Progressing   Problem: Urinary Elimination: Goal: Ability to achieve and maintain adequate renal perfusion and functioning will improve Outcome: Progressing

## 2023-12-24 ENCOUNTER — Encounter (HOSPITAL_COMMUNITY): Payer: Self-pay | Admitting: Thoracic Surgery (Cardiothoracic Vascular Surgery)

## 2023-12-24 ENCOUNTER — Other Ambulatory Visit: Payer: Self-pay | Admitting: Thoracic Surgery (Cardiothoracic Vascular Surgery)

## 2023-12-24 ENCOUNTER — Ambulatory Visit: Payer: Medicare HMO

## 2023-12-24 ENCOUNTER — Inpatient Hospital Stay (HOSPITAL_COMMUNITY): Payer: Medicare HMO

## 2023-12-24 DIAGNOSIS — I25118 Atherosclerotic heart disease of native coronary artery with other forms of angina pectoris: Secondary | ICD-10-CM

## 2023-12-24 DIAGNOSIS — I350 Nonrheumatic aortic (valve) stenosis: Secondary | ICD-10-CM

## 2023-12-24 LAB — BASIC METABOLIC PANEL
Anion gap: 7 (ref 5–15)
BUN: 55 mg/dL — ABNORMAL HIGH (ref 8–23)
CO2: 25 mmol/L (ref 22–32)
Calcium: 7.7 mg/dL — ABNORMAL LOW (ref 8.9–10.3)
Chloride: 98 mmol/L (ref 98–111)
Creatinine, Ser: 2.62 mg/dL — ABNORMAL HIGH (ref 0.61–1.24)
GFR, Estimated: 25 mL/min — ABNORMAL LOW (ref >60)
Glucose, Bld: 123 mg/dL — ABNORMAL HIGH (ref 70–99)
Potassium: 4.1 mmol/L (ref 3.5–5.1)
Sodium: 130 mmol/L — ABNORMAL LOW (ref 135–145)

## 2023-12-24 LAB — CBC
HCT: 25.2 % — ABNORMAL LOW (ref 39.0–52.0)
Hemoglobin: 8.7 g/dL — ABNORMAL LOW (ref 13.0–17.0)
MCH: 29.2 pg (ref 26.0–34.0)
MCHC: 34.5 g/dL (ref 30.0–36.0)
MCV: 84.6 fL (ref 80.0–100.0)
Platelets: 225 10*3/uL (ref 150–400)
RBC: 2.98 MIL/uL — ABNORMAL LOW (ref 4.22–5.81)
RDW: 16.5 % — ABNORMAL HIGH (ref 11.5–15.5)
WBC: 8.7 10*3/uL (ref 4.0–10.5)
nRBC: 0 % (ref 0.0–0.2)

## 2023-12-24 LAB — GLUCOSE, CAPILLARY
Glucose-Capillary: 129 mg/dL — ABNORMAL HIGH (ref 70–99)
Glucose-Capillary: 101 mg/dL — ABNORMAL HIGH (ref 70–99)

## 2023-12-24 MED ORDER — AMIODARONE HCL 200 MG PO TABS: 200.0000 mg | ORAL_TABLET | Freq: Every day | ORAL | Status: DC

## 2023-12-24 MED ORDER — ASPIRIN 325 MG PO TBEC: 325.0000 mg | DELAYED_RELEASE_TABLET | Freq: Every day | ORAL | Status: DC

## 2023-12-24 MED ORDER — TRAMADOL HCL 50 MG PO TABS: 50.0000 mg | ORAL_TABLET | Freq: Four times a day (QID) | ORAL | 0 refills | Status: AC | PRN

## 2023-12-24 MED ORDER — ASPIRIN 81 MG PO TBEC: 325.0000 mg | DELAYED_RELEASE_TABLET | Freq: Every day | ORAL | Status: DC

## 2023-12-24 NOTE — Progress Notes (Signed)
 CARDIAC REHAB PHASE I     Reviewed post OHS education including site care, restrictions, heart healthy diet, sternal precautions, IS use at home, home needs at discharge, exercise guidelines and CRP2 reviewed. All questions and concerns addressed. Pt has active CRP2 order in for [facility]. Plan for discharge home today.   [phone]  Woodroe Chen, RN BSN 12/24/2023 8:34 AM

## 2023-12-24 NOTE — Progress Notes (Signed)
 4 Days Post-Op Procedure(s) (LRB): SUBXYPHOID PERICARDIAL WINDOW WITH CONVERSION TO STERNOTOMY. REPAIR OF RV INJURY (N/A) TRANSESOPHAGEAL ECHOCARDIOGRAM (TEE) (N/A) DRAINAGE OF PERICARDIAL FLUID CARDIOVERSION Subjective: Feels well  Objective: Vital signs in last 24 hours: Temp:  [97.5 F (36.4 C)-98.3 F (36.8 C)] 98.3 F (36.8 C) (02/25 0340) Pulse Rate:  [62-72] 70 (02/25 0340) Cardiac Rhythm: Heart block;Bundle branch block (02/24 2010) Resp:  [10-23] 17 (02/25 0340) BP: (93-112)/(52-65) 103/55 (02/25 0340) SpO2:  [93 %-99 %] 98 % (02/25 0340) Weight:  [95.1 kg] 95.1 kg (02/25 0343)  Hemodynamic parameters for last 24 hours:    Intake/Output from previous day: 02/24 0701 - 02/25 0700 In: 840 [P.O.:840] Out: 1850 [Urine:1850] Intake/Output this shift: No intake/output data recorded.  General appearance: alert, cooperative, and no distress Heart: regular rate and rhythm Lungs: clear to auscultation bilaterally Abdomen: benign Extremities: no edema Wound: incis healing well  Lab Results: Recent Labs    12/23/23 0339 12/24/23 0346  WBC 7.9 8.7  HGB 8.8* 8.7*  HCT 25.2* 25.2*  PLT 215 225   BMET:  Recent Labs    12/23/23 0339 12/24/23 0346  NA 126* 130*  K 3.5 4.1  CL 92* 98  CO2 21* 25  GLUCOSE 108* 123*  BUN 67* 55*  CREATININE 3.05* 2.62*  CALCIUM 7.7* 7.7*    PT/INR:  Recent Labs    12/21/23 0749  LABPROT 22.5*  INR 2.0*   ABG    Component Value Date/Time   PHART 7.445 12/20/2023 1341   HCO3 19.5 (L) 12/20/2023 1341   TCO2 20 (L) 12/20/2023 1341   ACIDBASEDEF 4.0 (H) 12/20/2023 1341   O2SAT 95 12/20/2023 1341   CBG (last 3)  Recent Labs    12/23/23 1946 12/23/23 2318 12/24/23 0331  GLUCAP 153* 115* 129*    Meds Scheduled Meds:  sodium chloride   Intravenous Once   acetaminophen  1,000 mg Oral Q6H   Or   acetaminophen (TYLENOL) oral liquid 160 mg/5 mL  1,000 mg Per Tube Q6H   amiodarone  200 mg Oral BID   aspirin EC  81  mg Oral Daily   atorvastatin  80 mg Oral QPM   bisacodyl  10 mg Oral Daily   Or   bisacodyl  10 mg Rectal Daily   docusate sodium  200 mg Oral Daily   enoxaparin (LOVENOX) injection  30 mg Subcutaneous Daily   ezetimibe  10 mg Oral QPM   insulin aspart  0-15 Units Subcutaneous Q4H   insulin aspart  2 Units Subcutaneous TID WC   levothyroxine  25 mcg Oral Q0600   metoprolol tartrate  50 mg Oral BID   multivitamin with minerals  1 tablet Oral q AM   pantoprazole  40 mg Oral Daily   Continuous Infusions: PRN Meds:.metoprolol tartrate, ondansetron (ZOFRAN) IV, oxyCODONE, sodium chloride flush, sorbitol, traMADol  Xrays No results found.  Assessment/Plan: S/P Procedure(s) (LRB): SUBXYPHOID PERICARDIAL WINDOW WITH CONVERSION TO STERNOTOMY. REPAIR OF RV INJURY (N/A) TRANSESOPHAGEAL ECHOCARDIOGRAM (TEE) (N/A) DRAINAGE OF PERICARDIAL FLUID CARDIOVERSION  1 afeb, VSS S BP 90's-110's, sinus w/ BBB 2 O2 sats good on RA 3 good UOP 4 creat trending lower to 2.62, not on diuretics- will get BMET in office next week, may need to get nephrology consult if doesn't trend improved  5 sodium improved to 130 6 normal WBC count 7 H/H stable at 8.7/25.2 8 CXR - small left effusion and basilar atx 9 appears stable for D/C, aspirin only per Dr  Weldner     LOS: 4 days    Rowe Clack PA-C Pager [phone] 12/24/2023

## 2023-12-24 NOTE — Plan of Care (Signed)
  Problem: Metabolic: Goal: Ability to maintain appropriate glucose levels will improve 12/24/2023 0352 by Precious Bard, RN Outcome: Progressing 12/24/2023 0351 by Precious Bard, RN Outcome: Progressing   Problem: Clinical Measurements: Goal: Ability to maintain clinical measurements within normal limits will improve 12/24/2023 0352 by Precious Bard, RN Outcome: Progressing 12/24/2023 0351 by Precious Bard, RN Outcome: Progressing Goal: Cardiovascular complication will be avoided Outcome: Progressing   Problem: Education: Goal: Will demonstrate proper wound care and an understanding of methods to prevent future damage Outcome: Progressing   Problem: Activity: Goal: Risk for activity intolerance will decrease Outcome: Progressing   Problem: Cardiac: Goal: Will achieve and/or maintain hemodynamic stability Outcome: Progressing   Problem: Clinical Measurements: Goal: Postoperative complications will be avoided or minimized Outcome: Progressing   Problem: Skin Integrity: Goal: Wound healing without signs and symptoms of infection Outcome: Progressing Goal: Risk for impaired skin integrity will decrease Outcome: Progressing

## 2023-12-24 NOTE — Care Management Important Message (Signed)
 Important Message  Patient Details  Name: [name] MRN: [id number] Date of Birth: Jun 30, 1946   Important Message Given:  Yes - Medicare IM     Renie Ora 12/24/2023, 9:25 AM

## 2023-12-24 NOTE — Consult Note (Signed)
 [facility] Liaison Consult Note   12/24/2023  [name] 02/08/1946 [id number]  Insurance: Orpah Clinton   Primary Care Provider: Judy Pimple, MD with [place] at [facility], this provider is listed for the transition of care follow up appointments  and [facility] calls   [facility] Liaison on rounds at [facility]. Patient has transitioned home call attempt made and was able to leave a HIPAA appropriate voicemail to phone number provided.   The patient was screened for 7 day readmission hospitalization with noted medium risk score for unplanned readmission risk 2 hospital admissions in 6 months.  The patient was assessed for potential Community Care Coordination service needs for post hospital transition for care coordination. Review of patient's electronic medical record reveals patient is admitted with CAD and shortness of breath s/p recent CABG x 2.  Plan: [facility] Liaison will continue to follow progress and disposition to asess for post hospital community care coordination/management needs.  Referral request for community care coordination: Anticipate follow up with VBCI TOC team for follow up calls.   VBCI Community Care, Population Health does not replace or interfere with any arrangements made by the Inpatient Transition of Care team.   For questions contact:   Charlesetta Shanks, RN, BSN, CCM [place]  [facility] Liaison Direct Dial: [phone] or secure chat Email: [email or web address]

## 2023-12-24 NOTE — Progress Notes (Signed)
 Mobility Specialist Progress Note:    12/24/23 0911  Mobility  Activity Ambulated with assistance to bathroom  Level of Assistance Standby assist, set-up cues, supervision of patient - no hands on  Assistive Device None  Distance Ambulated (ft) 12 ft  RUE Weight Bearing Per Provider Order NWB  LUE Weight Bearing Per Provider Order NWB  Activity Response Tolerated well  Mobility Referral Yes  Mobility visit 1 Mobility  Mobility Specialist Start Time (ACUTE ONLY) 0907  Mobility Specialist Stop Time (ACUTE ONLY) 0912  Mobility Specialist Time Calculation (min) (ACUTE ONLY) 5 min   Pt received leaving bathroom, ambulated w/o AD, SB for safety. Tolerated well, asx throughout. Returned pt bed, deferred further ambulation at this time. Left with all needs met.    Feliciana Rossetti Mobility Specialist Please contact via Special educational needs teacher or  Rehab office at [phone]

## 2023-12-24 NOTE — Progress Notes (Unsigned)
 Cardiology Office Note:  .   Date:  12/26/2023  ID:  [name], DOB November 12, 1945, MRN [id number] PCP: Judy Pimple, MD  [facility] Providers Cardiologist:  None {  History of Present Illness: .   [name] is a 78 y.o. 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  Was seen by Dr. Lynnette Caffey September 2024.  Was having ongoing dyspnea and chest discomfort.  Had reported increasing shortness of breath and chest, starting over the summer.  Referred for coronary CTA which suggested significant stenosis in the mid to distal LAD artery.  An echocardiogram demonstrated moderate aortic stenosis with calcified aortic valve with diminished mobility.  Underwent cardiac catheterization which demonstrated moderate obstructive CAD with proximal LAD.  His cardiac output and index were preserved with relatively normal filling pressures.  At his last appointment he did report exertional chest discomfort with more than moderate exertion.  He had to modulate his levels of activity because of it.  This was also associated with shortness of breath.  He fortunately had no syncope or presyncope.  Denied fatigue.  No peripheral edema or PND, orthopnea.  Some mild dependent edema at times.  He normally is a quite active individual but he has definitely noticed over the past few months that he has not been able to do as much as he would like to do.  Used to be able to kayak and because of these new limitations he has not yet tried kayaking again.  Would like to hike.  Very good dental health.  He was scheduled for surgical evaluation.  Ultimately underwent CABG x 2 and bioprosthetic AVR by Dr. Leafy Ro 12/06/2023.  He did have postoperative atrial fibrillation managed by  amiodarone with conversion back to normal sinus rhythm.  He had some fluid overload and was continued on Lasix for 5 days.  He then was discharged from the hospital.  Unfortunately, return to the ER by EMS for evaluation of shortness of breath.  Bedside echocardiogram revealed a large pericardial effusion without tamponade physiology.  A formal echocardiogram confirmed this and was admitted for a pericardial window.  On 12/20/2023 he was taken to the OR for attempted pericardial window.  Initial incision was not adequate and unfortunately required reopening of the sternotomy.  Bilateral significant pleural effusions were found.  Mediastinal and pleural tubes were placed at that time.  Both removed in the ICU.  He unfortunately had some renal insufficiency related to hemodynamic compromise.  Diuretics were held until the kidneys recover.  Fortunately creatinine continue to trend down but was still above baseline.   Today, he presents with a history of cardiac disease, recently underwent two cardiac surgeries. The second surgery was necessitated due to a pericardial effusion. The patient was discharged for a few days between the surgeries but had to be readmitted due to the development of fluid around the heart. The patient did not undergo inpatient rehabilitation post-surgery but is considering outpatient cardiac rehabilitation. The patient has not experienced any episodes of heart fluttering or rapid heartbeat since the surgery and is currently on amiodarone and metoprolol to maintain normal sinus rhythm. The patient's LDL levels were higher than desired in January, and the patient is currently on Crestor and Zetia to manage this. The patient has not experienced any  significant side effects from the medications and has not needed to take tramadol for pain since the surgery. The patient is also on levothyroxine for stable thyroid function.  Reports no shortness of breath nor dyspnea on exertion. Reports no chest  pain, pressure, or tightness. No edema, orthopnea, PND. Reports no palpitations.   Discussed the use of AI scribe software for clinical note transcription with the patient, who gave verbal consent to proceed.  ROS: Pertinent ROS in HPI  Studies Reviewed: Marland Kitchen        CARDIAC CATHETERIZATION   CARDIAC CATHETERIZATION 07/03/2023   Narrative   Dist RCA lesion is 40% stenosed.   Prox LAD lesion is 65% stenosed.   Lat 1st Diag lesion is 80% stenosed.   1.  Moderate calcified proximal LAD lesion with mild diffuse disease elsewhere. 2.  Separate ostia of LAD and left circumflex arteries. 3.  Fick cardiac output of 11.3 L/min and Fick cardiac index of 5.4 L/min/m with the following hemodynamics: RA pressure mean of 8 mmHg RV pressure 41/3 with an end-diastolic pressure of 10 mmHg Wedge pressure mean of 12 mmHg with V waves to 18 mmHg PA pressure 42/9 with a mean of 23 mmHg Pulmonary vascular resistance of 1 Wood unit Pulmonary artery pulsatility index of 4 4.  Capacious iliofemoral vessels bilaterally. 5.  Right radial arterial loop requiring coronary guidewire to navigate.   Recommendation: Continue evaluation for management of concomitant aortic stenosis and coronary artery disease.   Findings Coronary Findings Diagnostic  Dominance: Right   Left Anterior Descending Prox LAD lesion is 65% stenosed.   Lateral First Diagonal Branch Lat 1st Diag lesion is 80% stenosed.   Ramus Intermedius There is mild diffuse disease throughout the vessel.   Left Circumflex There is mild diffuse disease throughout the vessel.   Right Coronary Artery There is mild diffuse disease throughout the vessel. Dist RCA lesion is 40% stenosed.   Intervention   No interventions have been documented.     ECHOCARDIOGRAM   ECHOCARDIOGRAM COMPLETE 06/27/2023   Narrative ECHOCARDIOGRAM REPORT       Patient Name:   [name] Date of Exam: 06/27/2023 Medical Rec #:  [id number]        Height:        67.5 in Accession #:    [id number]       Weight:       216.0 lb Date of Birth:  Nov 11, 1945       BSA:          2.101 m Patient Age:    76 years         BP:           170/92 mmHg Patient Gender: M                HR:           91 bpm. Exam Location:  [address]   Procedure: 2D Echo, 3D Echo, Cardiac Doppler, Color Doppler and Strain Analysis   MODIFIED REPORT: This report was modified by Olga Millers MD on 07/03/2023 due to change. Indications:     R01.1 Murmur   History:         Patient has prior history of Echocardiogram examinations, most recent 02/03/2014. Signs/Symptoms:Chest tightness, 2/6 systolic murmur at RUSB to LLSB and Shortness of Breath; Risk Factors:Hypertension and Dyslipidemia. Previous echo normal LV function and aortic sclerosis with mean gradient of 13 mmHg.   Sonographer:     Chanetta Marshall BA, RDCS  Referring Phys:  [id number] Tanna Savoy [facility] Diagnosing Phys: Olga Millers MD   IMPRESSIONS     1. Left ventricular ejection fraction, by estimation, is >75%. The left ventricle has hyperdynamic function. The left ventricle has no regional wall motion abnormalities. There is moderate concentric left ventricular hypertrophy. Left ventricular diastolic parameters are consistent with Grade I diastolic dysfunction (impaired relaxation). Elevated left atrial pressure. The average left ventricular global longitudinal strain is -16.6 %. The global longitudinal strain is normal. 2. Right ventricular systolic function is normal. The right ventricular size is normal. 3. The mitral valve is normal in structure. Trivial mitral valve regurgitation. No evidence of mitral stenosis. Moderate mitral annular calcification. 4. The aortic valve is calcified. Aortic valve regurgitation is not visualized. Moderate aortic valve stenosis. 5. The inferior vena cava is normal in size with greater than 50% respiratory variability, suggesting right atrial pressure of 3 mmHg.    Comparison(s): No prior Echocardiogram.   FINDINGS Left Ventricle: Left ventricular ejection fraction, by estimation, is >75%. The left ventricle has hyperdynamic function. The left ventricle has no regional wall motion abnormalities. The average left ventricular global longitudinal strain is -16.6 %. The global longitudinal strain is normal. The left ventricular internal cavity size was normal in size. There is moderate concentric left ventricular hypertrophy. Left ventricular diastolic parameters are consistent with Grade I diastolic dysfunction (impaired relaxation). Elevated left atrial pressure.   Right Ventricle: The right ventricular size is normal. Right ventricular systolic function is normal.   Left Atrium: Left atrial size was normal in size.   Right Atrium: Right atrial size was normal in size.   Pericardium: There is no evidence of pericardial effusion.   Mitral Valve: The mitral valve is normal in structure. Moderate mitral annular calcification. Trivial mitral valve regurgitation. No evidence of mitral valve stenosis. MV peak gradient, 6.7 mmHg. The mean mitral valve gradient is 4.0 mmHg.   Tricuspid Valve: The tricuspid valve is normal in structure. Tricuspid valve regurgitation is trivial. No evidence of tricuspid stenosis.   Aortic Valve: The aortic valve is calcified. Aortic valve regurgitation is not visualized. Moderate aortic stenosis is present. Aortic valve mean gradient measures 34.0 mmHg. Aortic valve peak gradient measures 57.5 mmHg. Aortic valve area, by VTI measures 1.13 cm.   Pulmonic Valve: The pulmonic valve was normal in structure. Pulmonic valve regurgitation is not visualized. No evidence of pulmonic stenosis.   Aorta: The aortic root is normal in size and structure.   Venous: The inferior vena cava is normal in size with greater than 50% respiratory variability, suggesting right atrial pressure of 3 mmHg.   IAS/Shunts: No atrial level shunt detected  by color flow Doppler.       Physical Exam:   VS:  BP 132/60   Pulse 88   Ht 5\' 8"  (1.727 m)   Wt 212 lb 12.8 oz (96.5 kg)   SpO2 99%   BMI 32.36 kg/m    Wt Readings from Last 3 Encounters:  12/26/23 212 lb 12.8 oz (96.5 kg)  12/24/23 209 lb 11.2 oz (95.1 kg)  12/11/23 220 lb 0.3 oz (99.8 kg)    GEN: Well nourished, well developed in no acute distress NECK: No JVD; No carotid bruits CARDIAC: RRR, no murmurs, rubs, gallops RESPIRATORY:  Clear to auscultation without rales, wheezing or rhonchi  ABDOMEN: Soft, non-tender, non-distended EXTREMITIES:  No edema; No deformity   ASSESSMENT AND PLAN: .    Post-Operative Cardiac Surgery: s/p CABG x  2 and AVR  for severe AS -complicated by post op Afib and pericardial effusion requiring pericardial window Patient underwent two cardiac surgeries, the second for a pericardial effusion. Currently recovering at home with no reported chest pain. Shortness of breath reported, likely due to recent surgeries and deconditioning. Incision healing well with no signs of infection. -Continue current medications as prescribed. -Consider outpatient cardiac rehabilitation starting six weeks post-surgery (around April 4th) pending clearance from Dr. Leafy Ro. -If shortness of breath worsens or chest pain develops, patient to contact healthcare provider immediately.  Hypertension -well controlled today -continue current medications  Hyperlipidemia LDL was 97 in January, higher than the desired level of <70 post-bypass surgery. Patient is currently on Crestor and Zetia. -Plan to recheck lipid panel in a couple of months to assess response to medication.  Hypothyroidism Stable on levothyroxine with no reported issues. -Continue levothyroxine as prescribed.  Post-Operative Pain Management Patient reports no current need for pain medication. -Continue to manage pain as needed.  Follow-up Next appointment with Dr. Leafy Ro scheduled for the first week  of March. Chest x-ray will be performed prior to the appointment to assess for any fluid accumulation. -Attend follow-up appointment as scheduled. -Contact healthcare provider if home health does not reach out for post-discharge care.    Cardiac Rehabilitation Eligibility Assessment  The patient is ready to start cardiac rehabilitation pending clearance from the cardiac surgeon.      Dispo: You can follow-up in three months with Dr. Lynnette Caffey  Signed, Sharlene Dory, PA-C

## 2023-12-25 ENCOUNTER — Telehealth: Payer: Self-pay

## 2023-12-25 NOTE — Telephone Encounter (Signed)
 Copied from CRM [id number]. Topic: Clinical - Home Health Verbal Orders >> Dec 25, 2023  1:35 PM Adele Barthel wrote: Caller/Agency: Amber, [facility] Callback Number: [phone] Service Requested: Patient was recently hospitalized and [facility] did not receive resumption of care order when he was discharged. Would like to verify it is ok to go ahead and resume nursing and physical therapy services.  Frequency: n/a Any new concerns about the patient? No

## 2023-12-25 NOTE — Telephone Encounter (Signed)
Please ok those verbal orders  

## 2023-12-25 NOTE — Transitions of Care (Post Inpatient/ED Visit) (Signed)
 12/25/2023  Name: [name] MRN: [id number] DOB: 11-22-1945  Today's TOC FU Call Status: Today's TOC FU Call Status:: Successful TOC FU Call Completed TOC FU Call Complete Date: 12/25/23 Patient's Name and Date of Birth confirmed.  Transition Care Management Follow-up Telephone Call Date of Discharge: 12/24/23 [facility]) Type of Discharge: Inpatient Admission Primary Inpatient Discharge Diagnosis:: Large pericardial effusion How have you been since you were released from the hospital?: Better Any questions or concerns?: No  Items Reviewed: Did you receive and understand the discharge instructions provided?: Yes Medications obtained,verified, and reconciled?: Yes (Medications Reviewed) Any new allergies since your discharge?: No Dietary orders reviewed?: NA Do you have support at home?: Yes People in Home: spouse Name of Support/Comfort Primary Source: Analise, spouse  Medications Reviewed Today: Medications Reviewed Today     Reviewed by [facility], RN (Case Manager) on 12/25/23 at 1326  Med List Status: <None>   Medication Order Taking? Sig Documenting Provider Last Dose Status Informant  amiodarone (PACERONE) 200 MG tablet [id number]  Take 1 tablet (200 mg total) by mouth daily. Renee Rival  Active   aspirin EC 325 MG tablet [id number]  Take 1 tablet (325 mg total) by mouth daily. Swallow whole. Rowe Clack, PA-C  Active   ezetimibe (ZETIA) 10 MG tablet [id number] No Take 1 tablet (10 mg total) by mouth daily.  Patient taking differently: Take 10 mg by mouth every evening.   Quintella Reichert, MD 12/18/2023 Expired 12/22/23 2359 Self  levothyroxine (SYNTHROID) 25 MCG tablet [id number] No TAKE 1 TABLET BY MOUTH EVERY DAY BEFORE BREAKFAST Tower, Audrie Gallus, MD 12/19/2023 Active Self  metoprolol tartrate (LOPRESSOR) 50 MG tablet [id number] No Take 1 tablet (50 mg total) by mouth 2 (two) times daily. Rowe Clack, PA-C 12/19/2023  Active   Multiple Vitamin (MULTIVITAMIN WITH MINERALS) TABS tablet [id number] No Take 1 tablet by mouth in the morning. Centrum Silver [provider] 12/19/2023 Active Self  rosuvastatin (CRESTOR) 40 MG tablet [id number] No Take 1 tablet (40 mg total) by mouth daily.  Patient taking differently: Take 40 mg by mouth every evening.   Tower, Audrie Gallus, MD 12/18/2023 Active Self  traMADol (ULTRAM) 50 MG tablet [id number]  Take 1 tablet (50 mg total) by mouth every 6 (six) hours as needed for up to 7 days for moderate pain (pain score 4-6). Rowe Clack, PA-C  Active             Home Care and Equipment/Supplies: Were Home Health Services Ordered?: No Any new equipment or medical supplies ordered?: NA  Functional Questionnaire: Do you need assistance with bathing/showering or dressing?: No Do you need assistance with meal preparation?: No Do you need assistance with eating?: No Do you have difficulty maintaining continence: No Do you need assistance with getting out of bed/getting out of a chair/moving?: No Do you have difficulty managing or taking your medications?: No  Follow up appointments reviewed: PCP Follow-up appointment confirmed?: NA Specialist Hospital Follow-up appointment confirmed?: No Reason Specialist Follow-Up Not Confirmed: Patient has Specialist Provider Number and will Call for Appointment (The patient states he has appointments sent up with his cardiac providers) Do you need transportation to your follow-up appointment?: No Do you understand care options if your condition(s) worsen?: Yes-patient verbalized understanding  SDOH Interventions Today    Flowsheet Row Most Recent Value  SDOH Interventions   Food Insecurity Interventions Intervention Not Indicated  Housing Interventions Intervention Not Indicated  Transportation  Interventions Intervention Not Indicated  Utilities Interventions Intervention Not Indicated      Interventions Today    Flowsheet  Row Most Recent Value  Chronic Disease   Chronic disease during today's visit Other  [CAD]  General Interventions   General Interventions Discussed/Reviewed General Interventions Discussed, General Interventions Reviewed, Doctor Visits  Doctor Visits Discussed/Reviewed Doctor Visits Discussed  Education Interventions   Education Provided Provided Education  Provided Verbal Education On When to see the doctor  Pharmacy Interventions   Pharmacy Dicussed/Reviewed Medications and their functions       The patient has been provided with contact information for the care management team and has been advised to call with any health-related questions or concerns. The patient verbalized understanding with current POC. The patient is directed to their insurance card regarding availability of benefits coverage.  Deidre Ala, BSN, RN [place]  VBCI - [employer] [facility] RN Care Manager [phone]

## 2023-12-26 ENCOUNTER — Ambulatory Visit: Payer: Medicare HMO | Attending: Physician Assistant | Admitting: Physician Assistant

## 2023-12-26 ENCOUNTER — Encounter: Payer: Self-pay | Admitting: Physician Assistant

## 2023-12-26 VITALS — BP 132/60 | HR 88 | Ht 68.0 in | Wt 212.8 lb

## 2023-12-26 DIAGNOSIS — Z79899 Other long term (current) drug therapy: Secondary | ICD-10-CM

## 2023-12-26 DIAGNOSIS — E785 Hyperlipidemia, unspecified: Secondary | ICD-10-CM

## 2023-12-26 DIAGNOSIS — I350 Nonrheumatic aortic (valve) stenosis: Secondary | ICD-10-CM

## 2023-12-26 DIAGNOSIS — E7800 Pure hypercholesterolemia, unspecified: Secondary | ICD-10-CM

## 2023-12-26 DIAGNOSIS — I10 Essential (primary) hypertension: Secondary | ICD-10-CM

## 2023-12-26 DIAGNOSIS — Z951 Presence of aortocoronary bypass graft: Secondary | ICD-10-CM | POA: Diagnosis not present

## 2023-12-26 DIAGNOSIS — I2510 Atherosclerotic heart disease of native coronary artery without angina pectoris: Secondary | ICD-10-CM

## 2023-12-26 DIAGNOSIS — N182 Chronic kidney disease, stage 2 (mild): Secondary | ICD-10-CM

## 2023-12-26 DIAGNOSIS — Z952 Presence of prosthetic heart valve: Secondary | ICD-10-CM

## 2023-12-26 DIAGNOSIS — I3139 Other pericardial effusion (noninflammatory): Secondary | ICD-10-CM

## 2023-12-26 MED ORDER — ROSUVASTATIN CALCIUM 40 MG PO TABS: 40.0000 mg | ORAL_TABLET | Freq: Every day | ORAL | 3 refills | Status: AC

## 2023-12-26 MED ORDER — METOPROLOL TARTRATE 50 MG PO TABS: 50.0000 mg | ORAL_TABLET | Freq: Two times a day (BID) | ORAL | 3 refills | Status: DC

## 2023-12-26 MED ORDER — EZETIMIBE 10 MG PO TABS: 10.0000 mg | ORAL_TABLET | Freq: Every day | ORAL | 3 refills | Status: DC

## 2023-12-26 NOTE — Telephone Encounter (Signed)
 VO left on VM given Amber VO to restart nursing and PT services

## 2023-12-26 NOTE — Patient Instructions (Signed)
 Medication Instructions:  Refilled cardiac meds *If you need a refill on your cardiac medications before your next appointment, please call your pharmacy*   Lab Work: FASTING lipid panel, LFTs in 3 months If you have labs (blood work) drawn today and your tests are completely normal, you will receive your results only by: MyChart Message (if you have MyChart) OR A paper copy in the mail If you have any lab test that is abnormal or we need to change your treatment, we will call you to review the results.   Follow-Up: At [facility], you and your health needs are our priority.  As part of our continuing mission to provide you with exceptional heart care, we have created designated Provider Care Teams.  These Care Teams include your primary Cardiologist (physician) and Advanced Practice Providers (APPs -  Physician Assistants and Nurse Practitioners) who all work together to provide you with the care you need, when you need it.  We recommend signing up for the patient portal called "MyChart".  Sign up information is provided on this After Visit Summary.  MyChart is used to connect with patients for Virtual Visits (Telemedicine).  Patients are able to view lab/test results, encounter notes, upcoming appointments, etc.  Non-urgent messages can be sent to your provider as well.   To learn more about what you can do with MyChart, go to [email or web address].    Your next appointment:   3 month(s)  Provider:   Lynnette Caffey, MD  Other Instructions   1st Floor: - Lobby - Registration  - Pharmacy  - Lab - Cafe  2nd Floor: - PV Lab - Diagnostic Testing (echo, CT, nuclear med)  3rd Floor: - Vacant  4th Floor: - TCTS (cardiothoracic surgery) - AFib Clinic - Structural Heart Clinic - Vascular Surgery  - Vascular Ultrasound  5th Floor: - HeartCare Cardiology (general and EP) - Clinical Pharmacy for coumadin, hypertension, lipid, weight-loss medications, and med management  appointments    Valet parking services will be available as well.

## 2023-12-27 ENCOUNTER — Telehealth: Payer: Self-pay | Admitting: Family Medicine

## 2023-12-27 NOTE — Telephone Encounter (Signed)
 Copied from CRM [id number]. Topic: Referral - Question >> Dec 27, 2023 10:08 AM Alcus Dad wrote: Reason for CRM: Rosa from [facility] wanted Dr. To know they will be getting started March 1 to resume for nursing and physical threrapy. [phone]

## 2023-12-27 NOTE — Telephone Encounter (Signed)
 Called spoke to [place] no orders needed just wanted to let our office know.  No further action needed at this time.

## 2023-12-27 NOTE — Telephone Encounter (Signed)
 Thanks  Please ok any verbal orders if needed

## 2023-12-30 ENCOUNTER — Other Ambulatory Visit (HOSPITAL_COMMUNITY): Payer: Medicare HMO

## 2023-12-30 ENCOUNTER — Ambulatory Visit: Payer: Medicare HMO | Admitting: Internal Medicine

## 2023-12-31 NOTE — Progress Notes (Unsigned)
 HPI: Mr. [name] is a 78 year old gentleman with past history of coronary artery disease and moderate aortic stenosis who underwent bioprosthetic aortic valve replacement and coronary bypass grafting last month by Dr. Leafy Ro.  He had atrial fibrillation following that procedure managed with amiodarone with successful conversion back to sinus rhythm and was discharged in stable condition on 12/12/2023.  He was readmitted through the emergency room on 12/19/2023 with acute shortness of breath.  He was discovered to have a large pericardial effusion and bilateral pleural effusions.  He was returned to the operating room on 12/20/2023 for drainage of the pericardial effusion and pleural effusions by way of the sternotomy.  Postoperatively, he remained in sinus rhythm.  He developed worsening acute renal insufficiency early postoperatively but this was showing evidence of recovery by the time of his discharge on 12/24/2023.  He returns to the office today for scheduled follow-up and repeat BMP. Since his discharge home, [name] said he is seeing gradual improvement in his endurance.  He still notes shortness of breath with activity. His appetite is good and he is having minimal pain from the sternotomy incision.  He denies any palpitations.   Current Outpatient Medications  Medication Sig Dispense Refill   amiodarone (PACERONE) 200 MG tablet Take 1 tablet (200 mg total) by mouth daily.     aspirin EC 325 MG tablet Take 1 tablet (325 mg total) by mouth daily. Swallow whole.     ezetimibe (ZETIA) 10 MG tablet Take 1 tablet (10 mg total) by mouth daily. 90 tablet 3   levothyroxine (SYNTHROID) 25 MCG tablet TAKE 1 TABLET BY MOUTH EVERY DAY BEFORE BREAKFAST 90 tablet 1   metoprolol tartrate (LOPRESSOR) 50 MG tablet Take 1 tablet (50 mg total) by mouth 2 (two) times daily. 90 tablet 3   Multiple Vitamin (MULTIVITAMIN WITH MINERALS) TABS tablet Take 1 tablet by mouth in the morning. Centrum Silver      rosuvastatin (CRESTOR) 40 MG tablet Take 1 tablet (40 mg total) by mouth daily. 90 tablet 3   traMADol (ULTRAM) 50 MG tablet Take 1 tablet (50 mg total) by mouth every 6 (six) hours as needed for up to 7 days for moderate pain (pain score 4-6). 28 tablet 0   No current facility-administered medications for this visit.    Physical Exam: Vital signs BP 158/73 Heart rate 86 Respirations 18 SpO2 98% on room air  General: Pleasant 78 year old gentleman in no distress.  He is walking unassisted with a steady gait.  Heart: Regular rate and rhythm, expected soft systolic murmur associated with the bioprosthetic valve.  Chest: Breath sounds are full and equal and clear to auscultation.  The sternotomy incision and chest tube insertion sites are all intact and healing no sign of complication.  The chest x-ray shows clear lung fields.  There is a triangular density in the left phrenic angle likely representing some atelectasis and pleural fluid ( The chest x-ray has not yet been read by the radiologist).  Extremities: There is no peripheral edema  Diagnostic Tests: Chest x-ray report is pending  Impression / Plan: -[name] appears to be making progressive and satisfactory recovery after coronary bypass grafting and aortic valve replacement with bioprosthetic valve about 1 month ago.  His postoperative course was complicated by pericardial effusion with early tamponade and bilateral pleural effusions all of which were drained in the operating room on 12/20/2023.  On the chest x-ray, he appears to be accumulating small bowel fluid at the left  costophrenic angle.  He was not discharged on any Lasix after the second admission.  I asked him to resume Lasix at 40 mg daily for the next 2 weeks to see if we can get this last bit of fluid to resolve without thoracentesis.    - Atrial fibrillation postoperatively- appears to be maintaining sinus rhythm.  Will continue the amiodarone 200 mg once daily for  now.  -Hypertension: His metoprolol was recently increased to 50 mg daily.  His systolic blood pressure was in the 150s today.  Since for starting back on Lasix today for a few weeks, will not make any other changes to his antihypertensive regimen.  -Plan follow-up with Dr. Leafy Ro in 2 weeks after he has had his follow-up echocardiogram.  Will repeat the chest x-ray at that time little to assure the effusion is resolving.  I have also asked him to get a BMP today to confirm his renal function which is also improving since he had acute kidney injury related to the pericardial tamponade.  Leary Roca, PA-C [facility] [phone]

## 2024-01-01 ENCOUNTER — Other Ambulatory Visit: Payer: Self-pay | Admitting: Thoracic Surgery (Cardiothoracic Vascular Surgery)

## 2024-01-01 ENCOUNTER — Ambulatory Visit
Admission: RE | Admit: 2024-01-01 | Discharge: 2024-01-01 | Disposition: A | Discharge status: Home / Self Care | Source: Ambulatory Visit | Attending: Thoracic Surgery (Cardiothoracic Vascular Surgery)

## 2024-01-01 ENCOUNTER — Ambulatory Visit (INDEPENDENT_AMBULATORY_CARE_PROVIDER_SITE_OTHER): Payer: Self-pay | Admitting: Physician Assistant

## 2024-01-01 ENCOUNTER — Encounter: Payer: Self-pay | Admitting: Physician Assistant

## 2024-01-01 VITALS — BP 158/73 | HR 86 | Resp 18 | Ht 68.0 in | Wt 216.0 lb

## 2024-01-01 DIAGNOSIS — E7800 Pure hypercholesterolemia, unspecified: Secondary | ICD-10-CM

## 2024-01-01 DIAGNOSIS — Z952 Presence of prosthetic heart valve: Secondary | ICD-10-CM

## 2024-01-01 DIAGNOSIS — J90 Pleural effusion, not elsewhere classified: Secondary | ICD-10-CM | POA: Diagnosis not present

## 2024-01-01 DIAGNOSIS — I25118 Atherosclerotic heart disease of native coronary artery with other forms of angina pectoris: Secondary | ICD-10-CM

## 2024-01-01 DIAGNOSIS — Z951 Presence of aortocoronary bypass graft: Secondary | ICD-10-CM

## 2024-01-01 DIAGNOSIS — I350 Nonrheumatic aortic (valve) stenosis: Secondary | ICD-10-CM

## 2024-01-01 DIAGNOSIS — J9811 Atelectasis: Secondary | ICD-10-CM | POA: Diagnosis not present

## 2024-01-01 DIAGNOSIS — Z79899 Other long term (current) drug therapy: Secondary | ICD-10-CM

## 2024-01-01 MED ORDER — POTASSIUM CHLORIDE CRYS ER 20 MEQ PO TBCR: 20.0000 meq | EXTENDED_RELEASE_TABLET | Freq: Every day | ORAL | 0 refills | Status: DC

## 2024-01-01 MED ORDER — FUROSEMIDE 40 MG PO TABS: 40.0000 mg | ORAL_TABLET | Freq: Every day | ORAL | 0 refills | Status: DC

## 2024-01-01 NOTE — Patient Instructions (Signed)
 Recommend starting back on Lasix 40 mg once daily for [redacted] weeks along with potassium chloride 20 mili-equivalents once daily for 2 weeks.  Recommend using the incentive spirometer several times daily.  Continue to advance your activity while observing sternal precautions as instructed at your previous discharge.  Plan follow-up with Dr. Leafy Ro in 2 to 3 weeks repeat chest x-ray

## 2024-01-07 ENCOUNTER — Ambulatory Visit: Payer: Medicare HMO

## 2024-01-17 ENCOUNTER — Ambulatory Visit (HOSPITAL_COMMUNITY): Payer: Medicare HMO | Attending: Cardiology

## 2024-01-17 DIAGNOSIS — Z952 Presence of prosthetic heart valve: Secondary | ICD-10-CM | POA: Insufficient documentation

## 2024-01-17 LAB — ECHOCARDIOGRAM COMPLETE
AV Mean grad: 10 mmHg
AV Peak grad: 20.3 mmHg
Ao pk vel: 2.25 m/s
S' Lateral: 2.7 cm

## 2024-01-18 ENCOUNTER — Encounter: Payer: Self-pay | Admitting: Cardiology

## 2024-01-18 DIAGNOSIS — I77810 Thoracic aortic ectasia: Secondary | ICD-10-CM | POA: Insufficient documentation

## 2024-01-20 ENCOUNTER — Telehealth: Payer: Self-pay

## 2024-01-20 DIAGNOSIS — I77810 Thoracic aortic ectasia: Secondary | ICD-10-CM

## 2024-01-20 NOTE — Telephone Encounter (Signed)
 Spoke with pt regarding echo results. A repeat echo was ordered for a year from now. Pt verbalized understanding. All questions, if any, were answered.

## 2024-01-20 NOTE — Telephone Encounter (Signed)
-----   Message from Armanda Magic sent at 01/18/2024  6:48 PM EDT ----- 2D echo showed normal pumping function of the heart EF 60 to 65% with mild enlargement of the left atrium, trivial leakiness of the mitral valve.  Stable aortic valve replacement with normal function, mildly dilated ascending aorta at 41 mm.  Repeat 2D echo in 1 year for a standing aortic dilatation

## 2024-01-21 ENCOUNTER — Other Ambulatory Visit: Payer: Self-pay | Admitting: Thoracic Surgery (Cardiothoracic Vascular Surgery)

## 2024-01-21 DIAGNOSIS — Z952 Presence of prosthetic heart valve: Secondary | ICD-10-CM

## 2024-01-22 NOTE — Progress Notes (Unsigned)
 [address]       [place] [address]             [phone]           Ed Mandich [place] Medical Record #[id number] Date of Birth: 12-Oct-1946  Quintella Reichert, MD Judy Pimple, MD  Chief Complaint:   sp AVR/CABG with pericardial effusion drainage  History of Present Illness:     Pt now about 4 1/2 weeks from redo sternotomy for pericardial drainage. Says he feels better with only some DOE with walking hills but improving. Has been off lasix for a week. No complaints      Past Medical History:  Diagnosis Date   Aortic stenosis    s/p  23 mm Inspiris valve present in the aortic position.  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 12/2023   Back pain    Coronary artery disease    Diverticulosis    Heart murmur    History of colon polyps    Hyperlipidemia    on meds   Hypertension    on meds   Hypothyroidism    Moderate aortic stenosis    Pre-diabetes    Thyroid disease    on meds    Past Surgical History:  Procedure Laterality Date   AORTIC VALVE REPLACEMENT N/A 12/06/2023   Procedure: AORTIC VALVE REPLACEMENT (AVR) USING INSPIRIS AORTIC VALVE SIZE 23 MM;  Surgeon: Eugenio Hoes, MD;  Location: [facility] OR;  Service: Open Heart Surgery;  Laterality: N/A;   CARDIOVERSION  12/20/2023   Procedure: CARDIOVERSION;  Surgeon: Eugenio Hoes, MD;  Location: [facility] OR;  Service: Thoracic;;   COLONOSCOPY  2018   SA-MAC-suprep-ageq with lavage-tics/int hems/TA x 5 frags   CORONARY ARTERY BYPASS GRAFT N/A 12/06/2023   Procedure: CORONARY ARTERY BYPASS GRAFTING (CABG) TIMES TWO USING LEFT INTERNAL MAMMARY ARTERY AND ENDOSCOPICALLY HARVESTED RIGHT GREATER SAPHENOUS VEIN;  Surgeon: Eugenio Hoes, MD;  Location: [facility] OR;  Service: Open Heart Surgery;  Laterality: N/A;   INGUINAL HERNIA REPAIR Bilateral 02/13/2017   Procedure: LAPAROSCOPIC  BILATERAL INGUINAL HERNIA REPAIR WITH MESH;  Surgeon: Abigail Miyamoto, MD;  Location: [facility] OR;  Service: General;  Laterality: Bilateral;   LASIK     PERICARDIAL FLUID DRAINAGE  12/20/2023   Procedure: DRAINAGE OF PERICARDIAL FLUID;  Surgeon: Eugenio Hoes, MD;  Location: [facility] OR;  Service: Thoracic;;   POLYPECTOMY  2018   TA x 5   RIGHT HEART CATH AND CORONARY ANGIOGRAPHY N/A 07/03/2023   Procedure: RIGHT HEART CATH AND CORONARY ANGIOGRAPHY;  Surgeon: [name], MD;  Location: [facility] INVASIVE CV LAB;  Service: Cardiovascular;  Laterality: N/A;   SUBXYPHOID PERICARDIAL WINDOW N/A 12/20/2023   Procedure: SUBXYPHOID PERICARDIAL WINDOW WITH CONVERSION TO STERNOTOMY. REPAIR OF RV INJURY;  Surgeon: Eugenio Hoes, MD;  Location: [facility] OR;  Service: Thoracic;  Laterality: N/A;   TEE WITHOUT CARDIOVERSION N/A 12/06/2023   Procedure: TRANSESOPHAGEAL ECHOCARDIOGRAM (TEE);  Surgeon: Eugenio Hoes, MD;  Location: [facility] OR;  Service: Open Heart Surgery;  Laterality: N/A;   TEE WITHOUT CARDIOVERSION N/A 12/20/2023   Procedure: TRANSESOPHAGEAL ECHOCARDIOGRAM (TEE);  Surgeon: Eugenio Hoes, MD;  Location: [facility] OR;  Service: Thoracic;  Laterality: N/A;   UMBILICAL HERNIA REPAIR N/A 02/13/2017   Procedure: LAPAROSCOPIC UMBILICAL HERNIA REPAIR;  Surgeon: Abigail Miyamoto, MD;  Location: [facility] OR;  Service: General;  Laterality: N/A;   WISDOM TOOTH EXTRACTION      Social History   Tobacco Use  Smoking Status Never  Smokeless Tobacco Never    Social History   Substance and Sexual Activity  Alcohol Use Yes   Alcohol/week: 21.0 - 28.0 standard drinks of alcohol   Types: 21 - 28 Cans of beer per week   Comment: 3-4 cans of beer daily    Social History   Socioeconomic History   Marital status: Married    Spouse name: Not on file   Number of children: 1   Years of education: Not on file   Highest education level: Associate degree: occupational, Scientist, product/process development, or vocational program  Occupational History    Employer: [employer]  Tobacco Use   Smoking status: Never   Smokeless tobacco: Never  Vaping Use   Vaping status: Never Used  Substance and Sexual Activity   Alcohol use: Yes    Alcohol/week: 21.0 - 28.0 standard drinks of alcohol    Types: 21 - 28 Cans of beer per week    Comment: 3-4 cans of beer daily   Drug use: No   Sexual activity: Yes  Other Topics Concern   Not on file  Social History Narrative   Not on file   Social Drivers of Health   Financial Resource Strain: Low Risk  (07/04/2023)   Overall Financial Resource Strain (CARDIA)    Difficulty of Paying Living Expenses: Not hard at all  Food Insecurity: No Food Insecurity (12/25/2023)   Hunger Vital Sign    Worried About Running Out of Food in the Last Year: Never true    Ran Out of Food in the Last Year: Never true  Transportation Needs: No Transportation Needs (12/25/2023)   PRAPARE - Administrator, Civil Service (Medical): No    Lack of Transportation (Non-Medical): No  Physical Activity: Sufficiently Active (07/04/2023)   Exercise Vital Sign    Days of Exercise per Week: 5 days    Minutes of Exercise per Session: 60 min  Stress: Not on file  Social Connections: Moderately Isolated (12/19/2023)   Social Connection and Isolation Panel [NHANES]    Frequency of Communication with Friends and Family: Once a week    Frequency of Social Gatherings with Friends and Family: More than three times a week    Attends Religious Services: Never    Database administrator or Organizations: No    Attends Banker Meetings: Never    Marital Status: Married  Catering manager Violence: Not At Risk (12/25/2023)   Humiliation, Afraid, Rape, and Kick questionnaire    Fear of Current or Ex-Partner: No    Emotionally Abused: No    Physically Abused: No    Sexually Abused: No    Allergies  Allergen Reactions   Vytorin [Ezetimibe-Simvastatin] Other (See Comments)    back pain    Current Outpatient Medications   Medication Sig Dispense Refill   amiodarone (PACERONE) 200 MG tablet Take 1 tablet (200 mg total) by mouth daily.     aspirin EC 325 MG tablet Take 1 tablet (325 mg total) by mouth daily. Swallow whole.     ezetimibe (ZETIA) 10 MG tablet Take 1 tablet (10 mg total) by mouth daily. 90 tablet 3   furosemide (LASIX) 40 MG tablet Take 1 tablet (40 mg total) by mouth daily. 14 tablet 0   levothyroxine (SYNTHROID) 25 MCG tablet TAKE 1  TABLET BY MOUTH EVERY DAY BEFORE BREAKFAST 90 tablet 1   metoprolol tartrate (LOPRESSOR) 50 MG tablet Take 1 tablet (50 mg total) by mouth 2 (two) times daily. 90 tablet 3   Multiple Vitamin (MULTIVITAMIN WITH MINERALS) TABS tablet Take 1 tablet by mouth in the morning. Centrum Silver     potassium chloride SA (KLOR-CON M) 20 MEQ tablet Take 1 tablet (20 mEq total) by mouth daily. 14 tablet 0   rosuvastatin (CRESTOR) 40 MG tablet Take 1 tablet (40 mg total) by mouth daily. 90 tablet 3   No current facility-administered medications for this visit.     Family History  Problem Relation Age of Onset   Cancer Mother        brain tumor   Emphysema Father    Diabetes Father    HIV Brother    Colon cancer Neg Hx    Colon polyps Neg Hx    Esophageal cancer Neg Hx    Rectal cancer Neg Hx    Stomach cancer Neg Hx        Physical Exam: Incision healing well     Diagnostic Studies & Laboratory data: I have personally reviewed the following studies and agree with the findings   TTE (12/2023) IMPRESSIONS     1. Left ventricular ejection fraction, by estimation, is 60 to 65%. The  left ventricle has normal function. The left ventricle has no regional  wall motion abnormalities. Left ventricular diastolic parameters are  indeterminate. The average left  ventricular global longitudinal strain is -19.2 %. The global longitudinal  strain is normal.   2. Right ventricular systolic function is normal. The right ventricular  size is normal.   3. Left atrial size  was mildly dilated.   4. The mitral valve is normal in structure. Trivial mitral valve  regurgitation. No evidence of mitral stenosis.   5. The aortic valve has been repaired/replaced. Aortic valve  regurgitation is not visualized. No aortic stenosis is present. There is a  23 mm Inspiris valve present in the aortic position. Procedure Date:  12/06/23. Echo findings are consistent with normal   structure and function of the aortic valve prosthesis. Aortic valve mean  gradient measures 10.0 mmHg. Aortic valve Vmax measures 2.25 m/s.   6. There is mild dilatation of the ascending aorta, measuring 41 mm.   7. The inferior vena cava is normal in size with greater than 50%  respiratory variability, suggesting right atrial pressure of 3 mmHg.   Comparison(s): No pericardial effusion.   FINDINGS   Left Ventricle: Left ventricular ejection fraction, by estimation, is 60  to 65%. The left ventricle has normal function. The left ventricle has no  regional wall motion abnormalities. The average left ventricular global  longitudinal strain is -19.2 %.  Strain was performed and the global longitudinal strain is normal. The  left ventricular internal cavity size was normal in size. There is no left  ventricular hypertrophy. Left ventricular diastolic parameters are  indeterminate.   Right Ventricle: The right ventricular size is normal. No increase in  right ventricular wall thickness. Right ventricular systolic function is  normal.   Left Atrium: Left atrial size was mildly dilated.   Right Atrium: Right atrial size was normal in size.   Pericardium: There is no evidence of pericardial effusion.   Mitral Valve: The mitral valve is normal in structure. Trivial mitral  valve regurgitation. No evidence of mitral valve stenosis.   Tricuspid Valve: The tricuspid valve is normal  in structure. Tricuspid  valve regurgitation is not demonstrated. No evidence of tricuspid  stenosis.   Aortic Valve:  The aortic valve has been repaired/replaced. Aortic valve  regurgitation is not visualized. No aortic stenosis is present. Aortic  valve mean gradient measures 10.0 mmHg. Aortic valve peak gradient  measures 20.2 mmHg. There is a 23 mm  Inspiris valve present in the aortic position. Procedure Date: 12/06/23.  Echo findings are consistent with normal structure and function of the  aortic valve prosthesis.   Pulmonic Valve: The pulmonic valve was normal in structure. Pulmonic valve  regurgitation is not visualized. No evidence of pulmonic stenosis.   Aorta: The aortic root is normal in size and structure. There is mild  dilatation of the ascending aorta, measuring 41 mm.   Venous: The inferior vena cava is normal in size with greater than 50%  respiratory variability, suggesting right atrial pressure of 3 mmHg.   IAS/Shunts: No atrial level shunt detected by color flow Doppler.     LEFT VENTRICLE  PLAX 2D  LVIDd:         3.90 cm  LVIDs:         2.70 cm 2D Longitudinal Strain  LV PW:         1.00 cm 2D Strain GLS (A4C):   -17.4 %  LV IVS:        1.00 cm 2D Strain GLS (A3C):   -23.6 %                         2D Strain GLS (A2C):   -16.8 %                         2D Strain GLS Avg:     -19.2 %   RIGHT VENTRICLE  RV Basal diam:  3.50 cm  RV S prime:     7.40 cm/s  TAPSE (M-mode): 1.4 cm   LEFT ATRIUM             Index        RIGHT ATRIUM           Index  LA diam:        5.10 cm 2.42 cm/m   RA Area:     24.20 cm  LA Vol (A2C):   69.1 ml 32.72 ml/m  RA Volume:   79.40 ml  37.60 ml/m  LA Vol (A4C):   80.9 ml 38.31 ml/m  LA Biplane Vol: 79.1 ml 37.46 ml/m   AORTIC VALVE  AV Vmax:           225.00 cm/s  AV Vmean:          145.000 cm/s  AV VTI:            0.380 m  AV Peak Grad:      20.2 mmHg  AV Mean Grad:      10.0 mmHg  LVOT Vmax:         134.00 cm/s  LVOT Vmean:        90.500 cm/s  LVOT VTI:          0.237 m  LVOT/AV VTI ratio: 0.62    Recent Radiology Findings:   CXR  today with clearer lung fields    Recent Lab Findings: Lab Results  Component Value Date   WBC 8.7 12/24/2023   HGB 8.7 (L) 12/24/2023   HCT 25.2 (L) 12/24/2023   PLT  225 12/24/2023   GLUCOSE 123 (H) 12/24/2023   CHOL 162 11/21/2023   TRIG 94 11/21/2023   HDL 48 11/21/2023   LDLDIRECT 130.0 01/06/2013   LDLCALC 97 11/21/2023   ALT 204 (H) 12/21/2023   AST 279 (H) 12/21/2023   NA 130 (L) 12/24/2023   K 4.1 12/24/2023   CL 98 12/24/2023   CREATININE 2.62 (H) 12/24/2023   BUN 55 (H) 12/24/2023   CO2 25 12/24/2023   TSH 2.67 03/15/2023   INR 2.0 (H) 12/21/2023   HGBA1C 5.7 (H) 12/04/2023      Assessment / Plan:     Doing well. Will DC amiodarone. Will call if becomes more fluid overloaded off lasix with more SOB if occurs with restart lasix. Restrictions reviewed. Will be seeing Dr Lynnette Caffey in May. Echo looks great. Pt very pleased with care   I have spent 30 min in review of the records, viewing studies and in face to face with patient and in coordination of future care    Eugenio Hoes 01/22/2024 1:13 PM

## 2024-01-23 ENCOUNTER — Ambulatory Visit
Admission: RE | Admit: 2024-01-23 | Discharge: 2024-01-23 | Disposition: A | Discharge status: Home / Self Care | Source: Ambulatory Visit | Attending: Thoracic Surgery (Cardiothoracic Vascular Surgery) | Admitting: Thoracic Surgery (Cardiothoracic Vascular Surgery)

## 2024-01-23 ENCOUNTER — Ambulatory Visit (INDEPENDENT_AMBULATORY_CARE_PROVIDER_SITE_OTHER): Payer: Self-pay | Admitting: Thoracic Surgery (Cardiothoracic Vascular Surgery)

## 2024-01-23 ENCOUNTER — Encounter: Payer: Self-pay | Admitting: Thoracic Surgery (Cardiothoracic Vascular Surgery)

## 2024-01-23 VITALS — BP 172/73 | HR 77 | Resp 20 | Ht 68.0 in | Wt 205.0 lb

## 2024-01-23 DIAGNOSIS — Z952 Presence of prosthetic heart valve: Secondary | ICD-10-CM

## 2024-01-23 DIAGNOSIS — J9811 Atelectasis: Secondary | ICD-10-CM | POA: Diagnosis not present

## 2024-01-23 DIAGNOSIS — J90 Pleural effusion, not elsewhere classified: Secondary | ICD-10-CM | POA: Diagnosis not present

## 2024-01-23 NOTE — Patient Instructions (Signed)
 Stop amiodarone Cardiac rehab

## 2024-01-28 ENCOUNTER — Telehealth (HOSPITAL_COMMUNITY): Payer: Self-pay

## 2024-01-28 NOTE — Telephone Encounter (Signed)
 Called patient to see if he was interested in participating in the Cardiac Rehab Program. Patient will come in for orientation on 02/04/24 @ 10:30AM and will attend the 8:15AM exercise class. Went over insurance, patient verbalized understanding.   Pensions consultant.

## 2024-01-29 ENCOUNTER — Other Ambulatory Visit (HOSPITAL_COMMUNITY): Payer: Self-pay | Admitting: *Deleted

## 2024-01-29 DIAGNOSIS — Z952 Presence of prosthetic heart valve: Secondary | ICD-10-CM

## 2024-01-29 DIAGNOSIS — Z951 Presence of aortocoronary bypass graft: Secondary | ICD-10-CM

## 2024-02-03 ENCOUNTER — Telehealth (HOSPITAL_COMMUNITY): Payer: Self-pay

## 2024-02-04 ENCOUNTER — Encounter (HOSPITAL_COMMUNITY)
Admission: RE | Admit: 2024-02-04 | Discharge: 2024-02-04 | Disposition: A | Discharge status: Home / Self Care | Source: Ambulatory Visit | Attending: Cardiology | Admitting: Cardiology

## 2024-02-04 VITALS — BP 130/62 | HR 85 | Ht 69.0 in | Wt 212.7 lb

## 2024-02-04 DIAGNOSIS — Z952 Presence of prosthetic heart valve: Secondary | ICD-10-CM

## 2024-02-04 DIAGNOSIS — I77810 Thoracic aortic ectasia: Secondary | ICD-10-CM | POA: Diagnosis present

## 2024-02-04 DIAGNOSIS — Z951 Presence of aortocoronary bypass graft: Secondary | ICD-10-CM | POA: Diagnosis present

## 2024-02-04 NOTE — Progress Notes (Signed)
 Cardiac Individual Treatment Plan  Patient Details  Name: [name] MRN: [id number] Date of Birth: 1946/07/21 Referring Provider:   Flowsheet Row INTENSIVE CARDIAC REHAB ORIENT from 02/04/2024 in [facility]  Referring Provider Armanda Magic, MD       Initial Encounter Date:  Flowsheet Row INTENSIVE CARDIAC REHAB ORIENT from 02/04/2024 in [facility]  Date 02/04/24       Visit Diagnosis: 12/06/23 S/P CABG x 2  12/06/23 S/P AVR (aortic valve replacement)  Ascending aorta dilatation (HCC) - Plan: ECHOCARDIOGRAM COMPLETE, ECHOCARDIOGRAM COMPLETE  Aortic root dilation (HCC) - Plan: ECHOCARDIOGRAM COMPLETE, ECHOCARDIOGRAM COMPLETE  Patient's Home Medications on Admission:  Current Outpatient Medications:    aspirin EC 325 MG tablet, Take 1 tablet (325 mg total) by mouth daily. Swallow whole., Disp: , Rfl:    ezetimibe (ZETIA) 10 MG tablet, Take 1 tablet (10 mg total) by mouth daily., Disp: 90 tablet, Rfl: 3   levothyroxine (SYNTHROID) 25 MCG tablet, TAKE 1 TABLET BY MOUTH EVERY DAY BEFORE BREAKFAST, Disp: 90 tablet, Rfl: 1   metoprolol tartrate (LOPRESSOR) 50 MG tablet, Take 1 tablet (50 mg total) by mouth 2 (two) times daily., Disp: 90 tablet, Rfl: 3   Multiple Vitamin (MULTIVITAMIN WITH MINERALS) TABS tablet, Take 1 tablet by mouth in the morning. Centrum Silver, Disp: , Rfl:    rosuvastatin (CRESTOR) 40 MG tablet, Take 1 tablet (40 mg total) by mouth daily., Disp: 90 tablet, Rfl: 3  Past Medical History: Past Medical History:  Diagnosis Date   Aortic stenosis    s/p  23 mm Inspiris valve present in the aortic position.  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 12/2023   Back pain    Coronary artery disease    Diverticulosis    Heart murmur     History of colon polyps    Hyperlipidemia    on meds   Hypertension    on meds   Hypothyroidism    Moderate aortic stenosis    Pre-diabetes    Thyroid disease    on meds    Tobacco Use: Social History   Tobacco Use  Smoking Status Never  Smokeless Tobacco Never    Labs: Review Flowsheet  More data exists      Latest Ref Rng & Units 08/13/2023 11/21/2023 12/04/2023 12/06/2023 12/20/2023  Labs for ITP Cardiac and Pulmonary Rehab  Cholestrol 100 - 199 mg/dL 409  811  - - -  LDL (calc) 0 - 99 mg/dL 914  97  - - -  HDL-C >78 mg/dL 29.56  48  - - -  Trlycerides 0 - 149 mg/dL 213.0  94  - - -  Hemoglobin A1c 4.8 - 5.6 % - - 5.7  - -  PH, Arterial 7.35 - 7.45 - - - 7.391  7.367  7.375  7.331  7.340  7.382  7.349  7.385  7.445  7.383   PCO2 arterial 32 - 48 mmHg - - - 36.6  35.7  39.3  44.0  45.9  41.4  41.7  41.3  28.3  34.9   Bicarbonate 20.0 - 28.0 mmol/L - - - 22.3  20.6  23.2  23.2  24.8  24.6  25.7  23.0  24.7  19.5  20.8   TCO2 22 - 32 mmol/L - - - 23  22  24  25  23  26  24  26  25  27  24  24  26  26  20  22  22    Acid-base deficit 0.0 - 2.0 mmol/L - - - 2.0  4.0  2.0  3.0  1.0  2.0  4.0  4.0   O2 Saturation % - - - 96  96  92  97  100  100  74  100  100  95  100     Details       Multiple values from one day are sorted in reverse-chronological order         Capillary Blood Glucose: Lab Results  Component Value Date   GLUCAP 101 (H) 12/24/2023   GLUCAP 129 (H) 12/24/2023   GLUCAP 115 (H) 12/23/2023   GLUCAP 153 (H) 12/23/2023   GLUCAP 135 (H) 12/23/2023     Exercise Target Goals: Exercise Program Goal: Individual exercise prescription set using results from initial 6 min walk test and THRR while considering  patient's activity barriers and safety.   Exercise Prescription Goal: Initial exercise prescription builds to 30-45 minutes a day of aerobic activity, 2-3 days per week.  Home exercise guidelines will be given to patient during program as part of  exercise prescription that the participant will acknowledge.  Activity Barriers & Risk Stratification:  Activity Barriers & Cardiac Risk Stratification - 02/04/24 1109       Activity Barriers & Cardiac Risk Stratification   Activity Barriers Arthritis;Joint Problems;Back Problems;Shortness of Breath;Balance Concerns    Cardiac Risk Stratification High             6 Minute Walk:  6 Minute Walk     Row Name 02/04/24 1314         6 Minute Walk   Phase Initial     Distance 1590 feet     Walk Time 6 minutes     # of Rest Breaks 0     MPH 3.01     METS 3.34     RPE 11     Perceived Dyspnea  1     VO2 Peak 11.7     Symptoms Yes (comment)     Comments Mild SOB, RPD = 1     Resting HR 84 bpm     Resting BP 130/62     Resting Oxygen Saturation  98 %     Exercise Oxygen Saturation  during 6 min walk 99 %     Max Ex. HR 117 bpm     Max Ex. BP 160/78     2 Minute Post BP 140/76              Oxygen Initial Assessment:   Oxygen Re-Evaluation:   Oxygen Discharge (Final Oxygen Re-Evaluation):   Initial Exercise Prescription:  Initial Exercise Prescription - 02/04/24 1300       Date of Initial Exercise RX and Referring Provider   Date 02/04/24    Referring Provider Armanda Magic, MD    Expected Discharge Date 04/29/24      Recumbant Bike   Level 2    RPM 60    Watts 41    Minutes 15    METs 3.3      NuStep   Level 2    SPM 80    Minutes 15    METs 3.3  Prescription Details   Frequency (times per week) 3    Duration Progress to 30 minutes of continuous aerobic without signs/symptoms of physical distress      Intensity   THRR 40-80% of Max Heartrate 58-116    Ratings of Perceived Exertion 11-13    Perceived Dyspnea 0-4      Progression   Progression Continue progressive overload as per policy without signs/symptoms or physical distress.      Resistance Training   Training Prescription Yes    Weight 4    Reps 10-15              Perform Capillary Blood Glucose checks as needed.  Exercise Prescription Changes:   Exercise Comments:   Exercise Goals and Review:   Exercise Goals     Row Name 02/04/24 1108             Exercise Goals   Increase Physical Activity Yes       Intervention Provide advice, education, support and counseling about physical activity/exercise needs.;Develop an individualized exercise prescription for aerobic and resistive training based on initial evaluation findings, risk stratification, comorbidities and participant's personal goals.       Expected Outcomes Short Term: Attend rehab on a regular basis to increase amount of physical activity.;Long Term: Exercising regularly at least 3-5 days a week.;Long Term: Add in home exercise to make exercise part of routine and to increase amount of physical activity.       Increase Strength and Stamina Yes       Intervention Provide advice, education, support and counseling about physical activity/exercise needs.;Develop an individualized exercise prescription for aerobic and resistive training based on initial evaluation findings, risk stratification, comorbidities and participant's personal goals.       Expected Outcomes Short Term: Increase workloads from initial exercise prescription for resistance, speed, and METs.;Short Term: Perform resistance training exercises routinely during rehab and add in resistance training at home;Long Term: Improve cardiorespiratory fitness, muscular endurance and strength as measured by increased METs and functional capacity ( )       Able to understand and use rate of perceived exertion (RPE) scale Yes       Intervention Provide education and explanation on how to use RPE scale       Expected Outcomes Short Term: Able to use RPE daily in rehab to express subjective intensity level;Long Term:  Able to use RPE to guide intensity level when exercising independently       Knowledge and understanding of Target Heart  Rate Range (THRR) Yes       Intervention Provide education and explanation of THRR including how the numbers were predicted and where they are located for reference       Expected Outcomes Short Term: Able to state/look up THRR;Long Term: Able to use THRR to govern intensity when exercising independently;Short Term: Able to use daily as guideline for intensity in rehab       Understanding of Exercise Prescription Yes       Intervention Provide education, explanation, and written materials on patient's individual exercise prescription       Expected Outcomes Short Term: Able to explain program exercise prescription;Long Term: Able to explain home exercise prescription to exercise independently                Exercise Goals Re-Evaluation :   Discharge Exercise Prescription (Final Exercise Prescription Changes):   Nutrition:  Target Goals: Understanding of nutrition guidelines, daily intake of sodium 1500mg , cholesterol 200mg ,  calories 30% from fat and 7% or less from saturated fats, daily to have 5 or more servings of fruits and vegetables.  Biometrics:  Pre Biometrics - 02/04/24 1040       Pre Biometrics   Waist Circumference 44 inches    Hip Circumference 43.75 inches    Waist to Hip Ratio 1.01 %    Triceps Skinfold 12 mm    % Body Fat 30 %    Grip Strength 22 kg    Flexibility 10.75 in    Single Leg Stand 6.25 seconds              Nutrition Therapy Plan and Nutrition Goals:   Nutrition Assessments:  MEDIFICTS Score Key: >=70 Need to make dietary changes  40-70 Heart Healthy Diet <= 40 Therapeutic Level Cholesterol Diet    Picture Your Plate Scores: <60 Unhealthy dietary pattern with much room for improvement. 41-50 Dietary pattern unlikely to meet recommendations for good health and room for improvement. 51-60 More healthful dietary pattern, with some room for improvement.  >60 Healthy dietary pattern, although there may be some specific behaviors that  could be improved.    Nutrition Goals Re-Evaluation:   Nutrition Goals Re-Evaluation:   Nutrition Goals Discharge (Final Nutrition Goals Re-Evaluation):   Psychosocial: Target Goals: Acknowledge presence or absence of significant depression and/or stress, maximize coping skills, provide positive support system. Participant is able to verbalize types and ability to use techniques and skills needed for reducing stress and depression.  Initial Review & Psychosocial Screening:  Initial Psych Review & Screening - 02/04/24 1105       Initial Review   Current issues with None Identified      Family Dynamics   Good Support System? Yes   Pt has spouse for support     Barriers   Psychosocial barriers to participate in program There are no identifiable barriers or psychosocial needs.      Screening Interventions   Interventions Encouraged to exercise             Quality of Life Scores:  Quality of Life - 02/04/24 1321       Quality of Life   Select Quality of Life      Quality of Life Scores   Health/Function Pre 19.33 %    Socioeconomic Pre 20.36 %    Psych/Spiritual Pre 16.36 %    Family Pre 21.5 %    GLOBAL Pre 19.25 %            Scores of 19 and below usually indicate a poorer quality of life in these areas.  A difference of  2-3 points is a clinically meaningful difference.  A difference of 2-3 points in the total score of the Quality of Life Index has been associated with significant improvement in overall quality of life, self-image, physical symptoms, and general health in studies assessing change in quality of life.  PHQ-9: Review Flowsheet  More data exists      02/04/2024 07/05/2023 05/27/2023 03/22/2023 03/12/2022  Depression screen PHQ 2/9  Decreased Interest 0 0 0 0 0  Down, Depressed, Hopeless 0 0 0 0 0  PHQ - 2 Score 0 0 0 0 0  Altered sleeping 0 0 0 1 -  Tired, decreased energy 1 0 0 0 -  Change in appetite 0 0 0 0 -  Feeling bad or failure about  yourself  0 0 0 0 -  Trouble concentrating 0 0 0 0 -  Moving slowly or fidgety/restless 0 0 0 0 -  Suicidal thoughts 0 0 0 0 -  PHQ-9 Score 1 0 0 1 -  Difficult doing work/chores Somewhat difficult Not difficult at all Not difficult at all Not difficult at all -   Interpretation of Total Score  Total Score Depression Severity:  1-4 = Minimal depression, 5-9 = Mild depression, 10-14 = Moderate depression, 15-19 = Moderately severe depression, 20-27 = Severe depression   Psychosocial Evaluation and Intervention:   Psychosocial Re-Evaluation:   Psychosocial Discharge (Final Psychosocial Re-Evaluation):   Vocational Rehabilitation: Provide vocational rehab assistance to qualifying candidates.   Vocational Rehab Evaluation & Intervention:  Vocational Rehab - 02/04/24 1106       Initial Vocational Rehab Evaluation & Intervention   Assessment shows need for Vocational Rehabilitation No   Pt is retired            Education: Education Goals: Education classes will be provided on a weekly basis, covering required topics. Participant will state understanding/return demonstration of topics presented.     Core Videos: Exercise    Move It!  Clinical staff conducted group or individual video education with verbal and written material and guidebook.  Patient learns the recommended Pritikin exercise program. Exercise with the goal of living a long, healthy life. Some of the health benefits of exercise include controlled diabetes, healthier blood pressure levels, improved cholesterol levels, improved heart and lung capacity, improved sleep, and better body composition. Everyone should speak with their doctor before starting or changing an exercise routine.  Biomechanical Limitations Clinical staff conducted group or individual video education with verbal and written material and guidebook.  Patient learns how biomechanical limitations can impact exercise and how we can mitigate and  possibly overcome limitations to have an impactful and balanced exercise routine.  Body Composition Clinical staff conducted group or individual video education with verbal and written material and guidebook.  Patient learns that body composition (ratio of muscle mass to fat mass) is a key component to assessing overall fitness, rather than body weight alone. Increased fat mass, especially visceral belly fat, can put [place] at increased risk for metabolic syndrome, type 2 diabetes, heart disease, and even death. It is recommended to combine diet and exercise (cardiovascular and resistance training) to improve your body composition. Seek guidance from your physician and exercise physiologist before implementing an exercise routine.  Exercise Action Plan Clinical staff conducted group or individual video education with verbal and written material and guidebook.  Patient learns the recommended strategies to achieve and enjoy long-term exercise adherence, including variety, self-motivation, self-efficacy, and positive decision making. Benefits of exercise include fitness, good health, weight management, more energy, better sleep, less stress, and overall well-being.  Medical   Heart Disease Risk Reduction Clinical staff conducted group or individual video education with verbal and written material and guidebook.  Patient learns our heart is our most vital organ as it circulates oxygen, nutrients, white blood cells, and hormones throughout the entire body, and carries waste away. Data supports a plant-based eating plan like the Pritikin Program for its effectiveness in slowing progression of and reversing heart disease. The video provides a number of recommendations to address heart disease.   Metabolic Syndrome and Belly Fat  Clinical staff conducted group or individual video education with verbal and written material and guidebook.  Patient learns what metabolic syndrome is, how it leads to heart disease,  and how one can reverse it and keep it from coming back. You have  metabolic syndrome if you have 3 of the following 5 criteria: abdominal obesity, high blood pressure, high triglycerides, low HDL cholesterol, and high blood sugar.  Hypertension and Heart Disease Clinical staff conducted group or individual video education with verbal and written material and guidebook.  Patient learns that high blood pressure, or hypertension, is very common in the [place]. Hypertension is largely due to excessive salt intake, but other important risk factors include being overweight, physical inactivity, drinking too much alcohol, smoking, and not eating enough potassium from fruits and vegetables. High blood pressure is a leading risk factor for heart attack, stroke, congestive heart failure, dementia, kidney failure, and premature death. Long-term effects of excessive salt intake include stiffening of the arteries and thickening of heart muscle and organ damage. Recommendations include ways to reduce hypertension and the risk of heart disease.  Diseases of Our Time - Focusing on Diabetes Clinical staff conducted group or individual video education with verbal and written material and guidebook.  Patient learns why the best way to stop diseases of our time is prevention, through food and other lifestyle changes. Medicine (such as prescription pills and surgeries) is often only a Band-Aid on the problem, not a long-term solution. Most common diseases of our time include obesity, type 2 diabetes, hypertension, heart disease, and cancer. The Pritikin Program is recommended and has been proven to help reduce, reverse, and/or prevent the damaging effects of metabolic syndrome.  Nutrition   Overview of the Pritikin Eating Plan  Clinical staff conducted group or individual video education with verbal and written material and guidebook.  Patient learns about the Pritikin Eating Plan for disease risk reduction. The  Pritikin Eating Plan emphasizes a wide variety of unrefined, minimally-processed carbohydrates, like fruits, vegetables, whole grains, and legumes. Go, Caution, and Stop food choices are explained. Plant-based and lean animal proteins are emphasized. Rationale provided for low sodium intake for blood pressure control, low added sugars for blood sugar stabilization, and low added fats and oils for coronary artery disease risk reduction and weight management.  Calorie Density  Clinical staff conducted group or individual video education with verbal and written material and guidebook.  Patient learns about calorie density and how it impacts the Pritikin Eating Plan. Knowing the characteristics of the food you choose will help you decide whether those foods will lead to weight gain or weight loss, and whether you want to consume more or less of them. Weight loss is usually a side effect of the Pritikin Eating Plan because of its focus on low calorie-dense foods.  Label Reading  Clinical staff conducted group or individual video education with verbal and written material and guidebook.  Patient learns about the Pritikin recommended label reading guidelines and corresponding recommendations regarding calorie density, added sugars, sodium content, and whole grains.  Dining Out - Part 1  Clinical staff conducted group or individual video education with verbal and written material and guidebook.  Patient learns that restaurant meals can be sabotaging because they can be so high in calories, fat, sodium, and/or sugar. Patient learns recommended strategies on how to positively address this and avoid unhealthy pitfalls.  Facts on Fats  Clinical staff conducted group or individual video education with verbal and written material and guidebook.  Patient learns that lifestyle modifications can be just as effective, if not more so, as many medications for lowering your risk of heart disease. A Pritikin lifestyle can  help to reduce your risk of inflammation and atherosclerosis (cholesterol build-up, or plaque,  in the artery walls). Lifestyle interventions such as dietary choices and physical activity address the cause of atherosclerosis. A review of the types of fats and their impact on blood cholesterol levels, along with dietary recommendations to reduce fat intake is also included.  Nutrition Action Plan  Clinical staff conducted group or individual video education with verbal and written material and guidebook.  Patient learns how to incorporate Pritikin recommendations into their lifestyle. Recommendations include planning and keeping personal health goals in mind as an important part of their success.  Healthy Mind-Set    Healthy Minds, Bodies, Hearts  Clinical staff conducted group or individual video education with verbal and written material and guidebook.  Patient learns how to identify when they are stressed. Video will discuss the impact of that stress, as well as the many benefits of stress management. Patient will also be introduced to stress management techniques. The way we think, act, and feel has an impact on our hearts.  How Our Thoughts Can Heal Our Hearts  Clinical staff conducted group or individual video education with verbal and written material and guidebook.  Patient learns that negative thoughts can cause depression and anxiety. This can result in negative lifestyle behavior and serious health problems. Cognitive behavioral therapy is an effective method to help control our thoughts in order to change and improve our emotional outlook.  Additional Videos:  Exercise    Improving Performance  Clinical staff conducted group or individual video education with verbal and written material and guidebook.  Patient learns to use a non-linear approach by alternating intensity levels and lengths of time spent exercising to help burn more calories and lose more body fat. Cardiovascular exercise  helps improve heart health, metabolism, hormonal balance, blood sugar control, and recovery from fatigue. Resistance training improves strength, endurance, balance, coordination, reaction time, metabolism, and muscle mass. Flexibility exercise improves circulation, posture, and balance. Seek guidance from your physician and exercise physiologist before implementing an exercise routine and learn your capabilities and proper form for all exercise.  Introduction to Yoga  Clinical staff conducted group or individual video education with verbal and written material and guidebook.  Patient learns about yoga, a discipline of the coming together of mind, breath, and body. The benefits of yoga include improved flexibility, improved range of motion, better posture and core strength, increased lung function, weight loss, and positive self-image. Yoga's heart health benefits include lowered blood pressure, healthier heart rate, decreased cholesterol and triglyceride levels, improved immune function, and reduced stress. Seek guidance from your physician and exercise physiologist before implementing an exercise routine and learn your capabilities and proper form for all exercise.  Medical   Aging: Enhancing Your Quality of Life  Clinical staff conducted group or individual video education with verbal and written material and guidebook.  Patient learns key strategies and recommendations to stay in good physical health and enhance quality of life, such as prevention strategies, having an advocate, securing a Health Care Proxy and Power of Attorney, and keeping a list of medications and system for tracking them. It also discusses how to avoid risk for bone loss.  Biology of Weight Control  Clinical staff conducted group or individual video education with verbal and written material and guidebook.  Patient learns that weight gain occurs because we consume more calories than we burn (eating more, moving less). Even if  your body weight is normal, you may have higher ratios of fat compared to muscle mass. Too much body fat puts you at increased  risk for cardiovascular disease, heart attack, stroke, type 2 diabetes, and obesity-related cancers. In addition to exercise, following the Pritikin Eating Plan can help reduce your risk.  Decoding Lab Results  Clinical staff conducted group or individual video education with verbal and written material and guidebook.  Patient learns that lab test reflects one measurement whose values change over time and are influenced by many factors, including medication, stress, sleep, exercise, food, hydration, pre-existing medical conditions, and more. It is recommended to use the knowledge from this video to become more involved with your lab results and evaluate your numbers to speak with your doctor.   Diseases of Our Time - Overview  Clinical staff conducted group or individual video education with verbal and written material and guidebook.  Patient learns that according to the CDC, 50% to 70% of chronic diseases (such as obesity, type 2 diabetes, elevated lipids, hypertension, and heart disease) are avoidable through lifestyle improvements including healthier food choices, listening to satiety cues, and increased physical activity.  Sleep Disorders Clinical staff conducted group or individual video education with verbal and written material and guidebook.  Patient learns how good quality and duration of sleep are important to overall health and well-being. Patient also learns about sleep disorders and how they impact health along with recommendations to address them, including discussing with a physician.  Nutrition  Dining Out - Part 2 Clinical staff conducted group or individual video education with verbal and written material and guidebook.  Patient learns how to plan ahead and communicate in order to maximize their dining experience in a healthy and nutritious manner.  Included are recommended food choices based on the type of restaurant the patient is visiting.   Fueling a Banker conducted group or individual video education with verbal and written material and guidebook.  There is a strong connection between our food choices and our health. Diseases like obesity and type 2 diabetes are very prevalent and are in large-part due to lifestyle choices. The Pritikin Eating Plan provides plenty of food and hunger-curbing satisfaction. It is easy to follow, affordable, and helps reduce health risks.  Menu Workshop  Clinical staff conducted group or individual video education with verbal and written material and guidebook.  Patient learns that restaurant meals can sabotage health goals because they are often packed with calories, fat, sodium, and sugar. Recommendations include strategies to plan ahead and to communicate with the manager, chef, or server to help order a healthier meal.  Planning Your Eating Strategy  Clinical staff conducted group or individual video education with verbal and written material and guidebook.  Patient learns about the Pritikin Eating Plan and its benefit of reducing the risk of disease. The Pritikin Eating Plan does not focus on calories. Instead, it emphasizes high-quality, nutrient-rich foods. By knowing the characteristics of the foods, we choose, we can determine their calorie density and make informed decisions.  Targeting Your Nutrition Priorities  Clinical staff conducted group or individual video education with verbal and written material and guidebook.  Patient learns that lifestyle habits have a tremendous impact on disease risk and progression. This video provides eating and physical activity recommendations based on your personal health goals, such as reducing LDL cholesterol, losing weight, preventing or controlling type 2 diabetes, and reducing high blood pressure.  Vitamins and Minerals  Clinical staff  conducted group or individual video education with verbal and written material and guidebook.  Patient learns different ways to obtain key vitamins and minerals, including  through a recommended healthy diet. It is important to discuss all supplements you take with your doctor.   Healthy Mind-Set    Smoking Cessation  Clinical staff conducted group or individual video education with verbal and written material and guidebook.  Patient learns that cigarette smoking and tobacco addiction pose a serious health risk which affects millions of people. Stopping smoking will significantly reduce the risk of heart disease, lung disease, and many forms of cancer. Recommended strategies for quitting are covered, including working with your doctor to develop a successful plan.  Culinary   Becoming a Set designer conducted group or individual video education with verbal and written material and guidebook.  Patient learns that cooking at home can be healthy, cost-effective, quick, and puts them in control. Keys to cooking healthy recipes will include looking at your recipe, assessing your equipment needs, planning ahead, making it simple, choosing cost-effective seasonal ingredients, and limiting the use of added fats, salts, and sugars.  Cooking - Breakfast and Snacks  Clinical staff conducted group or individual video education with verbal and written material and guidebook.  Patient learns how important breakfast is to satiety and nutrition through the entire day. Recommendations include key foods to eat during breakfast to help stabilize blood sugar levels and to prevent overeating at meals later in the day. Planning ahead is also a key component.  Cooking - Educational psychologist conducted group or individual video education with verbal and written material and guidebook.  Patient learns eating strategies to improve overall health, including an approach to cook more at home.  Recommendations include thinking of animal protein as a side on your plate rather than center stage and focusing instead on lower calorie dense options like vegetables, fruits, whole grains, and plant-based proteins, such as beans. Making sauces in large quantities to freeze for later and leaving the skin on your vegetables are also recommended to maximize your experience.  Cooking - Healthy Salads and Dressing Clinical staff conducted group or individual video education with verbal and written material and guidebook.  Patient learns that vegetables, fruits, whole grains, and legumes are the foundations of the Pritikin Eating Plan. Recommendations include how to incorporate each of these in flavorful and healthy salads, and how to create homemade salad dressings. Proper handling of ingredients is also covered. Cooking - Soups and [employer] - Soups and Desserts Clinical staff conducted group or individual video education with verbal and written material and guidebook.  Patient learns that Pritikin soups and desserts make for easy, nutritious, and delicious snacks and meal components that are low in sodium, fat, sugar, and calorie density, while high in vitamins, minerals, and filling fiber. Recommendations include simple and healthy ideas for soups and desserts.   Overview     The Pritikin Solution Program Overview Clinical staff conducted group or individual video education with verbal and written material and guidebook.  Patient learns that the results of the Pritikin Program have been documented in more than 100 articles published in peer-reviewed journals, and the benefits include reducing risk factors for (and, in some cases, even reversing) high cholesterol, high blood pressure, type 2 diabetes, obesity, and more! An overview of the three key pillars of the Pritikin Program will be covered: eating well, doing regular exercise, and having a healthy mind-set.  WORKSHOPS   Exercise: Exercise Basics: Building Your Action Plan Clinical staff led group instruction and group discussion with PowerPoint presentation and patient guidebook. To enhance the  learning environment the use of posters, models and videos may be added. At the conclusion of this workshop, patients will comprehend the difference between physical activity and exercise, as well as the benefits of incorporating both, into their routine. Patients will understand the FITT (Frequency, Intensity, Time, and Type) principle and how to use it to build an exercise action plan. In addition, safety concerns and other considerations for exercise and cardiac rehab will be addressed by the presenter. The purpose of this lesson is to promote a comprehensive and effective weekly exercise routine in order to improve patients' overall level of fitness.   Managing Heart Disease: Your Path to a Healthier Heart Clinical staff led group instruction and group discussion with PowerPoint presentation and patient guidebook. To enhance the learning environment the use of posters, models and videos may be added.At the conclusion of this workshop, patients will understand the anatomy and physiology of the heart. Additionally, they will understand how Pritikin's three pillars impact the risk factors, the progression, and the management of heart disease.  The purpose of this lesson is to provide a high-level overview of the heart, heart disease, and how the Pritikin lifestyle positively impacts risk factors.  Exercise Biomechanics Clinical staff led group instruction and group discussion with PowerPoint presentation and patient guidebook. To enhance the learning environment the use of posters, models and videos may be added. Patients will learn how the structural parts of their bodies function and how these functions impact their daily activities, movement, and exercise. Patients will learn how to promote a neutral spine, learn how  to manage pain, and identify ways to improve their physical movement in order to promote healthy living. The purpose of this lesson is to expose patients to common physical limitations that impact physical activity. Participants will learn practical ways to adapt and manage aches and pains, and to minimize their effect on regular exercise. Patients will learn how to maintain good posture while sitting, walking, and lifting.  Balance Training and Fall Prevention  Clinical staff led group instruction and group discussion with PowerPoint presentation and patient guidebook. To enhance the learning environment the use of posters, models and videos may be added. At the conclusion of this workshop, patients will understand the importance of their sensorimotor skills (vision, proprioception, and the vestibular system) in maintaining their ability to balance as they age. Patients will apply a variety of balancing exercises that are appropriate for their current level of function. Patients will understand the common causes for poor balance, possible solutions to these problems, and ways to modify their physical environment in order to minimize their fall risk. The purpose of this lesson is to teach patients about the importance of maintaining balance as they age and ways to minimize their risk of falling.  WORKSHOPS   Nutrition:  Fueling a Ship broker led group instruction and group discussion with PowerPoint presentation and patient guidebook. To enhance the learning environment the use of posters, models and videos may be added. Patients will review the foundational principles of the Pritikin Eating Plan and understand what constitutes a serving size in each of the food groups. Patients will also learn Pritikin-friendly foods that are better choices when away from home and review make-ahead meal and snack options. Calorie density will be reviewed and applied to three nutrition priorities:  weight maintenance, weight loss, and weight gain. The purpose of this lesson is to reinforce (in a group setting) the key concepts around what patients are recommended to eat and  how to apply these guidelines when away from home by planning and selecting Pritikin-friendly options. Patients will understand how calorie density may be adjusted for different weight management goals.  Mindful Eating  Clinical staff led group instruction and group discussion with PowerPoint presentation and patient guidebook. To enhance the learning environment the use of posters, models and videos may be added. Patients will briefly review the concepts of the Pritikin Eating Plan and the importance of low-calorie dense foods. The concept of mindful eating will be introduced as well as the importance of paying attention to internal hunger signals. Triggers for non-hunger eating and techniques for dealing with triggers will be explored. The purpose of this lesson is to provide patients with the opportunity to review the basic principles of the Pritikin Eating Plan, discuss the value of eating mindfully and how to measure internal cues of hunger and fullness using the Hunger Scale. Patients will also discuss reasons for non-hunger eating and learn strategies to use for controlling emotional eating.  Targeting Your Nutrition Priorities Clinical staff led group instruction and group discussion with PowerPoint presentation and patient guidebook. To enhance the learning environment the use of posters, models and videos may be added. Patients will learn how to determine their genetic susceptibility to disease by reviewing their family history. Patients will gain insight into the importance of diet as part of an overall healthy lifestyle in mitigating the impact of genetics and other environmental insults. The purpose of this lesson is to provide patients with the opportunity to assess their personal nutrition priorities by looking at  their family history, their own health history and current risk factors. Patients will also be able to discuss ways of prioritizing and modifying the Pritikin Eating Plan for their highest risk areas  Menu  Clinical staff led group instruction and group discussion with PowerPoint presentation and patient guidebook. To enhance the learning environment the use of posters, models and videos may be added. Using menus brought in from [employer], or printed from [employer], patients will apply the Pritikin dining out guidelines that were presented in the [employer] video. Patients will also be able to practice these guidelines in a variety of provided scenarios. The purpose of this lesson is to provide patients with the opportunity to practice hands-on learning of the Pritikin Dining Out guidelines with actual menus and practice scenarios.  Label Reading Clinical staff led group instruction and group discussion with PowerPoint presentation and patient guidebook. To enhance the learning environment the use of posters, models and videos may be added. Patients will review and discuss the Pritikin label reading guidelines presented in Pritikin's Label Reading Educational series video. Using fool labels brought in from local grocery stores and markets, patients will apply the label reading guidelines and determine if the packaged food meet the Pritikin guidelines. The purpose of this lesson is to provide patients with the opportunity to review, discuss, and practice hands-on learning of the Pritikin Label Reading guidelines with actual packaged food labels. Cooking School  Pritikin's [employer] are designed to teach patients ways to prepare quick, simple, and affordable recipes at home. The importance of nutrition's role in chronic disease risk reduction is reflected in its emphasis in the overall Pritikin program. By learning how to prepare essential core Pritikin Eating  Plan recipes, patients will increase control over what they eat; be able to customize the flavor of foods without the use of added salt, sugar, or fat; and improve the quality of the  food they consume. By learning a set of core recipes which are easily assembled, quickly prepared, and affordable, patients are more likely to prepare more healthy foods at home. These workshops focus on convenient breakfasts, simple entres, side dishes, and desserts which can be prepared with minimal effort and are consistent with nutrition recommendations for cardiovascular risk reduction. Cooking [employer] are taught by a Armed forces logistics/support/administrative officer (RD) who has been trained by the [employer]. The chef or RD has a clear understanding of the importance of minimizing - if not completely eliminating - added fat, sugar, and sodium in recipes. Throughout the series of Cooking School Workshop sessions, patients will learn about healthy ingredients and efficient methods of cooking to build confidence in their capability to prepare    Cooking School weekly topics:  Adding Flavor- Sodium-Free  Fast and Healthy Breakfasts  Powerhouse Plant-Based Proteins  Satisfying Salads and Dressings  Simple Sides and Sauces  International Cuisine-Spotlight on the [employer] Zones  Delicious Desserts  Savory Soups  [employer] - Meals in a Astronomer Appetizers and Snacks  Comforting Weekend Breakfasts  One-Pot Wonders   Fast Evening Meals  Landscape architect Your Pritikin Plate  WORKSHOPS   Healthy Mindset (Psychosocial):  Focused Goals, Sustainable Changes Clinical staff led group instruction and group discussion with PowerPoint presentation and patient guidebook. To enhance the learning environment the use of posters, models and videos may be added. Patients will be able to apply effective goal setting strategies to establish at least one personal goal, and then take consistent, meaningful  action toward that goal. They will learn to identify common barriers to achieving personal goals and develop strategies to overcome them. Patients will also gain an understanding of how our mind-set can impact our ability to achieve goals and the importance of cultivating a positive and growth-oriented mind-set. The purpose of this lesson is to provide patients with a deeper understanding of how to set and achieve personal goals, as well as the tools and strategies needed to overcome common obstacles which may arise along the way.  From Head to Heart: The Power of a Healthy Outlook  Clinical staff led group instruction and group discussion with PowerPoint presentation and patient guidebook. To enhance the learning environment the use of posters, models and videos may be added. Patients will be able to recognize and describe the impact of emotions and mood on physical health. They will discover the importance of self-care and explore self-care practices which may work for them. Patients will also learn how to utilize the 4 C's to cultivate a healthier outlook and better manage stress and challenges. The purpose of this lesson is to demonstrate to patients how a healthy outlook is an essential part of maintaining good health, especially as they continue their cardiac rehab journey.  Healthy Sleep for a Healthy Heart Clinical staff led group instruction and group discussion with PowerPoint presentation and patient guidebook. To enhance the learning environment the use of posters, models and videos may be added. At the conclusion of this workshop, patients will be able to demonstrate knowledge of the importance of sleep to overall health, well-being, and quality of life. They will understand the symptoms of, and treatments for, common sleep disorders. Patients will also be able to identify daytime and nighttime behaviors which impact sleep, and they will be able to apply these tools to help manage sleep-related  challenges. The purpose of this lesson is to provide patients with a general  overview of sleep and outline the importance of quality sleep. Patients will learn about a few of the most common sleep disorders. Patients will also be introduced to the concept of "sleep hygiene," and discover ways to self-manage certain sleeping problems through simple daily behavior changes. Finally, the workshop will motivate patients by clarifying the links between quality sleep and their goals of heart-healthy living.   Recognizing and Reducing Stress Clinical staff led group instruction and group discussion with PowerPoint presentation and patient guidebook. To enhance the learning environment the use of posters, models and videos may be added. At the conclusion of this workshop, patients will be able to understand the types of stress reactions, differentiate between acute and chronic stress, and recognize the impact that chronic stress has on their health. They will also be able to apply different coping mechanisms, such as reframing negative self-talk. Patients will have the opportunity to practice a variety of stress management techniques, such as deep abdominal breathing, progressive muscle relaxation, and/or guided imagery.  The purpose of this lesson is to educate patients on the role of stress in their lives and to provide healthy techniques for coping with it.  Learning Barriers/Preferences:  Learning Barriers/Preferences - 02/04/24 1321       Learning Barriers/Preferences   Learning Barriers Sight    Learning Preferences Video;Pictoral;Computer/Internet             Education Topics:  Knowledge Questionnaire Score:  Knowledge Questionnaire Score - 02/04/24 1322       Knowledge Questionnaire Score   Pre Score 22/24             Core Components/Risk Factors/Patient Goals at Admission:  Personal Goals and Risk Factors at Admission - 02/04/24 1106       Core Components/Risk Factors/Patient  Goals on Admission    Weight Management Yes;Weight Loss;Obesity    Intervention Weight Management: Develop a combined nutrition and exercise program designed to reach desired caloric intake, while maintaining appropriate intake of nutrient and fiber, sodium and fats, and appropriate energy expenditure required for the weight goal.;Weight Management: Provide education and appropriate resources to help participant work on and attain dietary goals.;Weight Management/Obesity: Establish reasonable short term and long term weight goals.    Admit Weight 212 lb 11.9 oz (96.5 kg)    Expected Outcomes Short Term: Continue to assess and modify interventions until short term weight is achieved;Long Term: Adherence to nutrition and physical activity/exercise program aimed toward attainment of established weight goal;Weight Loss: Understanding of general recommendations for a balanced deficit meal plan, which promotes 1-2 lb weight loss per week and includes a negative energy balance of [phone] kcal/d;Understanding recommendations for meals to include 15-35% energy as protein, 25-35% energy from fat, 35-60% energy from carbohydrates, less than 200mg  of dietary cholesterol, 20-35 gm of total fiber daily;Understanding of distribution of calorie intake throughout the day with the consumption of 4-5 meals/snacks    Hypertension Yes    Intervention Provide education on lifestyle modifcations including regular physical activity/exercise, weight management, moderate sodium restriction and increased consumption of fresh fruit, vegetables, and low fat dairy, alcohol moderation, and smoking cessation.;Monitor prescription use compliance.    Expected Outcomes Short Term: Continued assessment and intervention until BP is < 140/63mm HG in hypertensive participants. < 130/72mm HG in hypertensive participants with diabetes, heart failure or chronic kidney disease.;Long Term: Maintenance of blood pressure at goal levels.    Lipids Yes     Intervention Provide education and support for participant on nutrition & aerobic/resistive  exercise along with prescribed medications to achieve LDL 70mg , HDL >40mg .    Expected Outcomes Short Term: Participant states understanding of desired cholesterol values and is compliant with medications prescribed. Participant is following exercise prescription and nutrition guidelines.;Long Term: Cholesterol controlled with medications as prescribed, with individualized exercise RX and with personalized nutrition plan. Value goals: LDL < 70mg , HDL > 40 mg.             Core Components/Risk Factors/Patient Goals Review:    Core Components/Risk Factors/Patient Goals at Discharge (Final Review):    ITP Comments:  ITP Comments     Row Name 02/04/24 1112           ITP Comments Armanda Magic, MD: Medical Director.  Intorduction to the [facility] / Intensive Cardiac Rehab.  Initial orientation packet reviewed with the patient.               Comments: Participant attended orientation for the cardiac rehabilitation program on  02/04/2024  to perform initial intake and exercise walk test. Patient introduced to the Pritikin Program education and orientation packet was reviewed. Completed 6-minute walk test, measurements, initial ITP, and exercise prescription. Vital signs stable. Telemetry-normal sinus rhythm, asymptomatic.   Service time was from 10:18 to 11:52.

## 2024-02-04 NOTE — Progress Notes (Signed)
 Cardiac Rehab Medication Review   Does the patient  feel that his/her medications are working for him/her?  yes  Has the patient been experiencing any side effects to the medications prescribed?  no  Does the patient measure his/her own blood pressure or blood glucose at home?  yes   Does the patient have any problems obtaining medications due to transportation or finances?   no  Understanding of regimen: excellent Understanding of indications: excellent Potential of compliance: excellent    Comments: Pt check blood pressure at the [facility]. No questions regarding medications.     Lorin Picket 02/04/2024 10:35 AM

## 2024-02-06 ENCOUNTER — Other Ambulatory Visit: Payer: Self-pay | Admitting: Surgical

## 2024-02-10 ENCOUNTER — Ambulatory Visit (HOSPITAL_COMMUNITY)

## 2024-02-10 ENCOUNTER — Encounter (HOSPITAL_COMMUNITY)
Admission: RE | Admit: 2024-02-10 | Discharge: 2024-02-10 | Disposition: A | Discharge status: Home / Self Care | Source: Ambulatory Visit | Attending: Cardiology | Admitting: Cardiology

## 2024-02-10 DIAGNOSIS — Z952 Presence of prosthetic heart valve: Secondary | ICD-10-CM

## 2024-02-10 DIAGNOSIS — Z951 Presence of aortocoronary bypass graft: Secondary | ICD-10-CM | POA: Diagnosis not present

## 2024-02-10 NOTE — Progress Notes (Signed)
 Daily Session Note  Patient Details  Name: [name] MRN: [id number] Date of Birth: 1945-12-20 Referring Provider:   Flowsheet Row INTENSIVE CARDIAC REHAB ORIENT from 02/04/2024 in [facility]  Referring Provider Armanda Magic, MD       Encounter Date: 02/10/2024  Check In:  Session Check In - 02/10/24 3086       Check-In   Supervising physician immediately available to respond to emergencies [facility] MD immediately available    Physician(s) Rise Paganini, NP    Location [facility]    Staff Present Lorin Picket, MS, ACSM-CEP, CCRP, Exercise Physiologist;Bailey Wallace Cullens, MS, Exercise Physiologist;Jetta Dan Humphreys BS, ACSM-CEP, Exercise Physiologist;Olinty Peggye Pitt, MS, ACSM-CEP, Exercise Physiologist;Magdalen Cabana, RN, BSN    Virtual Visit No    Medication changes reported     No    Fall or balance concerns reported    No    Tobacco Cessation No Change    Warm-up and Cool-down Not performed (comment)   CRP2 orientation today   Resistance Training Performed Yes    VAD Patient? No    PAD/SET Patient? No      Pain Assessment   Currently in Pain? No/denies    Pain Score 0-No pain    Multiple Pain Sites No             Capillary Blood Glucose: No results found for this or any previous visit (from the past 24 hours).   Exercise Prescription Changes - 02/10/24 1600       Response to Exercise   Blood Pressure (Admit) 130/72    Blood Pressure (Exercise) 150/60    Blood Pressure (Exit) 138/70    Heart Rate (Admit) 69 bpm    Heart Rate (Exercise) 108 bpm    Heart Rate (Exit) 85 bpm    Rating of Perceived Exertion (Exercise) 11    Symptoms None    Comments Pt's first day in the CRP2 program    Duration Continue with 30 min of aerobic exercise without signs/symptoms of physical distress.    Intensity THRR unchanged      Progression   Progression Continue to progress workloads to maintain  intensity without signs/symptoms of physical distress.    Average METs 2.65      Resistance Training   Training Prescription Yes    Weight 4 lbs    Reps 10-15    Time 10 Minutes      Interval Training   Interval Training No      Recumbant Bike   Level 2    RPM 83    Watts 28    Minutes 15    METs 2.5      NuStep   Level 2    SPM 110    Minutes 15    METs 2.8             Social History   Tobacco Use  Smoking Status Never  Smokeless Tobacco Never    Goals Met:  Exercise tolerated well No report of concerns or symptoms today Strength training completed today  Goals Unmet:  Not Applicable  Comments: Pt started cardiac rehab today.  Pt tolerated light exercise without difficulty. VSS, telemetry-Sinus Rhythm first degree heart block small p wave, asymptomatic.  Medication list reconciled. Pt denies barriers to medicaiton compliance.  PSYCHOSOCIAL ASSESSMENT:  PHQ-1. Will review PHQ9 and quality of life in the upcoming week    Pt enjoys kayaking and woodworking.Marland Kitchen  Pt oriented to exercise equipment and routine.    Understanding verbalized. [place] RN BSN    Dr. Gaylyn Keas is Medical Director for Cardiac Rehab at [facility].

## 2024-02-12 ENCOUNTER — Encounter (HOSPITAL_COMMUNITY)
Admission: RE | Admit: 2024-02-12 | Discharge: 2024-02-12 | Disposition: A | Discharge status: Home / Self Care | Source: Ambulatory Visit | Attending: Cardiology | Admitting: Cardiology

## 2024-02-12 DIAGNOSIS — Z951 Presence of aortocoronary bypass graft: Secondary | ICD-10-CM

## 2024-02-12 DIAGNOSIS — Z952 Presence of prosthetic heart valve: Secondary | ICD-10-CM

## 2024-02-14 ENCOUNTER — Encounter (HOSPITAL_COMMUNITY)
Admission: RE | Admit: 2024-02-14 | Discharge: 2024-02-14 | Disposition: A | Discharge status: Home / Self Care | Source: Ambulatory Visit | Attending: Cardiology | Admitting: Cardiology

## 2024-02-14 DIAGNOSIS — Z951 Presence of aortocoronary bypass graft: Secondary | ICD-10-CM | POA: Diagnosis not present

## 2024-02-14 DIAGNOSIS — Z952 Presence of prosthetic heart valve: Secondary | ICD-10-CM

## 2024-02-17 ENCOUNTER — Encounter (HOSPITAL_COMMUNITY)
Admission: RE | Admit: 2024-02-17 | Discharge: 2024-02-17 | Disposition: A | Discharge status: Home / Self Care | Source: Ambulatory Visit | Attending: Cardiology

## 2024-02-17 DIAGNOSIS — Z951 Presence of aortocoronary bypass graft: Secondary | ICD-10-CM | POA: Diagnosis not present

## 2024-02-17 DIAGNOSIS — Z952 Presence of prosthetic heart valve: Secondary | ICD-10-CM

## 2024-02-17 NOTE — Progress Notes (Signed)
 Cardiac Individual Treatment Plan  Patient Details  Name: [name] MRN: [id number] Date of Birth: 1945-11-24 Referring Provider:   Flowsheet Row INTENSIVE CARDIAC REHAB ORIENT from 02/04/2024 in 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 Swallow whole., Disp: , Rfl:    ezetimibe  (ZETIA ) 10 MG tablet, Take 1 tablet (10 mg total) by mouth daily., Disp: 90 tablet, Rfl: 3   levothyroxine  (SYNTHROID ) 25 MCG tablet, TAKE 1 TABLET BY MOUTH EVERY DAY BEFORE BREAKFAST, Disp: 90 tablet, Rfl: 1   metoprolol  tartrate (LOPRESSOR ) 50 MG tablet, Take 1 tablet (50 mg total) by mouth 2 (two) times daily., Disp: 90 tablet, Rfl: 3   Multiple Vitamin (MULTIVITAMIN WITH MINERALS) TABS tablet, Take 1 tablet by mouth in the morning. Centrum Silver, Disp: , Rfl:    rosuvastatin  (CRESTOR ) 40 MG tablet, Take 1 tablet (40 mg total) by mouth daily., Disp: 90 tablet, Rfl: 3  Past Medical History: Past Medical History:  Diagnosis Date   Aortic stenosis    s/p  23 mm Inspiris valve present in the aortic position.  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 12/2023   Back pain    Coronary artery disease    Diverticulosis    Heart murmur    History of colon polyps    Hyperlipidemia    on meds   Hypertension    on meds   Hypothyroidism    Moderate aortic stenosis    Pre-diabetes    Thyroid  disease    on  meds    Tobacco Use: Social History   Tobacco Use  Smoking Status Never  Smokeless Tobacco Never    Labs: Review Flowsheet  More data exists      Latest Ref Rng & Units 08/13/2023 11/21/2023 12/04/2023 12/06/2023 12/20/2023  Labs for ITP Cardiac and Pulmonary Rehab  Cholestrol 100 - 199 mg/dL 469  629  - - -  LDL (calc) 0 - 99 mg/dL 528  97  - - -  HDL-C >41 mg/dL 32.44  48  - - -  Trlycerides 0 - 149 mg/dL 010.2  94  - - -  Hemoglobin A1c 4.8 - 5.6 % - - 5.7  - -  PH, Arterial 7.35 - 7.45 - - - 7.391  7.367  7.375  7.331  7.340  7.382  7.349  7.385  7.445  7.383   PCO2 arterial 32 - 48 mmHg - - - 36.6  35.7  39.3  44.0  45.9  41.4  41.7  41.3  28.3  34.9   Bicarbonate 20.0 - 28.0 mmol/L - - - 22.3  20.6  23.2  23.2  24.8  24.6  25.7  23.0  24.7  19.5  20.8   TCO2 22 - 32 mmol/L - - - 23  22  24   25  23  26  24  26  25  27  24  24  26  26  20  22  22    Acid-base deficit 0.0 - 2.0 mmol/L - - - 2.0  4.0  2.0  3.0  1.0  2.0  4.0  4.0   O2 Saturation % - - - 96  96  92  97  100  100  74  100  100  95  100     Details       Multiple values from one day are sorted in reverse-chronological order         Capillary Blood Glucose: Lab Results  Component Value Date   GLUCAP 101 (H) 12/24/2023   GLUCAP 129 (H) 12/24/2023   GLUCAP 115 (H) 12/23/2023   GLUCAP 153 (H) 12/23/2023   GLUCAP 135 (H) 12/23/2023     Exercise Target Goals: Exercise Program Goal: Individual exercise prescription set using results from initial 6 min walk test and THRR while considering  patient's activity barriers and safety.   Exercise Prescription Goal: Initial exercise prescription builds to 30-45 minutes a day of aerobic activity, 2-3 days per week.  Home exercise guidelines will be given to patient during program as part of exercise prescription that the participant will acknowledge.  Activity Barriers & Risk Stratification:  Activity Barriers & Cardiac Risk Stratification - 02/04/24 1109        Activity Barriers & Cardiac Risk Stratification   Activity Barriers Arthritis;Joint Problems;Back Problems;Shortness of Breath;Balance Concerns    Cardiac Risk Stratification High             6 Minute Walk:  6 Minute Walk     Row Name 02/04/24 1314         6 Minute Walk   Phase Initial     Distance 1590 feet     Walk Time 6 minutes     # of Rest Breaks 0     MPH 3.01     METS 3.34     RPE 11     Perceived Dyspnea  1     VO2 Peak 11.7     Symptoms Yes (comment)     Comments Mild SOB, RPD = 1     Resting HR 84 bpm     Resting BP 130/62     Resting Oxygen Saturation  98 %     Exercise Oxygen Saturation  during 6 min walk 99 %     Max Ex. HR 117 bpm     Max Ex. BP 160/78     2 Minute Post BP 140/76              Oxygen Initial Assessment:   Oxygen Re-Evaluation:   Oxygen Discharge (Final Oxygen Re-Evaluation):   Initial Exercise Prescription:  Initial Exercise Prescription - 02/04/24 1300       Date of Initial Exercise RX and Referring Provider   Date 02/04/24    Referring Provider Luis Keas, MD    Expected Discharge Date 04/29/24      Recumbant Bike   Level 2    RPM 60    Watts 41    Minutes 15    METs 3.3      NuStep   Level 2    SPM 80    Minutes 15    METs 3.3      Prescription Details   Frequency (times per week) 3    Duration Progress to 30 minutes of  continuous aerobic without signs/symptoms of physical distress      Intensity   THRR 40-80% of Max Heartrate 58-116    Ratings of Perceived Exertion 11-13    Perceived Dyspnea 0-4      Progression   Progression Continue progressive overload as per policy without signs/symptoms or physical distress.      Resistance Training   Training Prescription Yes    Weight 4    Reps 10-15             Perform Capillary Blood Glucose checks as needed.  Exercise Prescription Changes:   Exercise Prescription Changes     Row Name 02/10/24 1600             Response to Exercise    Blood Pressure (Admit) 130/72       Blood Pressure (Exercise) 150/60       Blood Pressure (Exit) 138/70       Heart Rate (Admit) 69 bpm       Heart Rate (Exercise) 108 bpm       Heart Rate (Exit) 85 bpm       Rating of Perceived Exertion (Exercise) 11       Symptoms None       Comments Pt's first day in the CRP2 program       Duration Continue with 30 min of aerobic exercise without signs/symptoms of physical distress.       Intensity THRR unchanged         Progression   Progression Continue to progress workloads to maintain intensity without signs/symptoms of physical distress.       Average METs 2.65         Resistance Training   Training Prescription Yes       Weight 4 lbs       Reps 10-15       Time 10 Minutes         Interval Training   Interval Training No         Recumbant Bike   Level 2       RPM 83       Watts 28       Minutes 15       METs 2.5         NuStep   Level 2       SPM 110       Minutes 15       METs 2.8                Exercise Comments:   Exercise Comments     Row Name 02/10/24 1638           Exercise Comments Pt's first day in the CRP2 program. Pt exercise with no complaints and is off to a good start.                Exercise Goals and Review:   Exercise Goals     Row Name 02/04/24 1108             Exercise Goals   Increase Physical Activity Yes       Intervention Provide advice, education, support and counseling about physical activity/exercise needs.;Develop an individualized exercise prescription for aerobic and resistive training based on initial evaluation findings, risk stratification, comorbidities and participant's personal goals.       Expected Outcomes Short Term: Attend rehab on a regular basis to increase amount of physical activity.;Long Term: Exercising regularly at least 3-5 days  a week.;Long Term: Add in home exercise to make exercise part of routine and to increase amount of physical activity.        Increase Strength and Stamina Yes       Intervention Provide advice, education, support and counseling about physical activity/exercise needs.;Develop an individualized exercise prescription for aerobic and resistive training based on initial evaluation findings, risk stratification, comorbidities and participant's personal goals.       Expected Outcomes Short Term: Increase workloads from initial exercise prescription for resistance, speed, and METs.;Short Term: Perform resistance training exercises routinely during rehab and add in resistance training at home;Long Term: Improve cardiorespiratory fitness, muscular endurance and strength as measured by increased METs and functional capacity ( )       Able to understand and use rate of perceived exertion (RPE) scale Yes       Intervention Provide education and explanation on how to use RPE scale       Expected Outcomes Short Term: Able to use RPE daily in rehab to express subjective intensity level;Long Term:  Able to use RPE to guide intensity level when exercising independently       Knowledge and understanding of Target Heart Rate Range (THRR) Yes       Intervention Provide education and explanation of THRR including how the numbers were predicted and where they are located for reference       Expected Outcomes Short Term: Able to state/look up THRR;Long Term: Able to use THRR to govern intensity when exercising independently;Short Term: Able to use daily as guideline for intensity in rehab       Understanding of Exercise Prescription Yes       Intervention Provide education, explanation, and written materials on patient's individual exercise prescription       Expected Outcomes Short Term: Able to explain program exercise prescription;Long Term: Able to explain home exercise prescription to exercise independently                Exercise Goals Re-Evaluation :  Exercise Goals Re-Evaluation     Row Name 02/10/24 1637              Exercise Goal Re-Evaluation   Exercise Goals Review Increase Physical Activity;Understanding of Exercise Prescription;Increase Strength and Stamina;Knowledge and understanding of Target Heart Rate Range (THRR);Able to understand and use rate of perceived exertion (RPE) scale       Comments Pt's first day in the CRP2 program. Pt understands the exercise Rx, RPE scale and THRR.       Expected Outcomes will continue to monitor patient and progress exercise workloads as tolerated.                Discharge Exercise Prescription (Final Exercise Prescription Changes):  Exercise Prescription Changes - 02/10/24 1600       Response to Exercise   Blood Pressure (Admit) 130/72    Blood Pressure (Exercise) 150/60    Blood Pressure (Exit) 138/70    Heart Rate (Admit) 69 bpm    Heart Rate (Exercise) 108 bpm    Heart Rate (Exit) 85 bpm    Rating of Perceived Exertion (Exercise) 11    Symptoms None    Comments Pt's first day in the CRP2 program    Duration Continue with 30 min of aerobic exercise without signs/symptoms of physical distress.    Intensity THRR unchanged      Progression   Progression Continue to progress workloads to maintain intensity without signs/symptoms of physical distress.  Average METs 2.65      Resistance Training   Training Prescription Yes    Weight 4 lbs    Reps 10-15    Time 10 Minutes      Interval Training   Interval Training No      Recumbant Bike   Level 2    RPM 83    Watts 28    Minutes 15    METs 2.5      NuStep   Level 2    SPM 110    Minutes 15    METs 2.8             Nutrition:  Target Goals: Understanding of nutrition guidelines, daily intake of sodium 1500mg , cholesterol 200mg , calories 30% from fat and 7% or less from saturated fats, daily to have 5 or more servings of fruits and vegetables.  Biometrics:  Pre Biometrics - 02/04/24 1040       Pre Biometrics   Waist Circumference 44 inches    Hip Circumference 43.75 inches     Waist to Hip Ratio 1.01 %    Triceps Skinfold 12 mm    % Body Fat 30 %    Grip Strength 22 kg    Flexibility 10.75 in    Single Leg Stand 6.25 seconds              Nutrition Therapy Plan and Nutrition Goals:  Nutrition Therapy & Goals - 02/14/24 1059       Nutrition Therapy   Diet Heart Healthy Diet    Drug/Food Interactions Statins/Certain Fruits      Personal Nutrition Goals   Nutrition Goal Patient to identify strategies for reducing cardiovascular risk by attending the Pritikin education and nutrition series weekly.    Personal Goal #2 Patient to improve diet quality by using the plate method as a guide for meal planning to include lean protein/plant protein, fruits, vegetables, whole grains, nonfat dairy as part of a well-balanced diet.    Personal Goal #3 Patient to identify strategies for weight loss of 0.5-2.0# per week.    Comments Patient has medical history of s/p AVR, CABG x2, hyperlipidemia, HTN, CAD, hypothyroidism. Hospitalization was complicated by renal insufficiency; BMP improving, K+ 5.1. A1c is in a prediabetic range. Lipids WNL but LDL remains above goal. Patient is motivated to lose weight; will continue to address strategies for weight loss including calorie density, the plate method as a guide for meal planning, etc. Patient will benefit from participation in intensive cardiac rehab for nutrition, exercise, and lifestyle modification.      Intervention Plan   Intervention Prescribe, educate and counsel regarding individualized specific dietary modifications aiming towards targeted core components such as weight, hypertension, lipid management, diabetes, heart failure and other comorbidities.;Nutrition handout(s) given to patient.    Expected Outcomes Short Term Goal: Understand basic principles of dietary content, such as calories, fat, sodium, cholesterol and nutrients.;Long Term Goal: Adherence to prescribed nutrition plan.             Nutrition  Assessments:  MEDIFICTS Score Key: >=70 Need to make dietary changes  40-70 Heart Healthy Diet <= 40 Therapeutic Level Cholesterol Diet    Picture Your Plate Scores: <29 Unhealthy dietary pattern with much room for improvement. 41-50 Dietary pattern unlikely to meet recommendations for good health and room for improvement. 51-60 More healthful dietary pattern, with some room for improvement.  >60 Healthy dietary pattern, although there may be some specific behaviors that could be improved.  Nutrition Goals Re-Evaluation:  Nutrition Goals Re-Evaluation     Row Name 02/14/24 1059             Goals   Current Weight 213 lb 6.5 oz (96.8 kg)       Comment A1c 5.7, Lipids WNL, LDL 97       Expected Outcome Patient has medical history of s/p AVR, CABG x2, hyperlipidemia, HTN, CAD, hypothyroidism. Hospitalization was complicated by renal insufficiency; BMP improving, K+ 5.1. A1c is in a prediabetic range. Lipids WNL but LDL remains above goal. Patient is motivated to lose weight; will continue to address strategies for weight loss including calorie density, the plate method as a guide for meal planning, etc. Patient will benefit from participation in intensive cardiac rehab for nutrition, exercise, and lifestyle modification.                Nutrition Goals Re-Evaluation:  Nutrition Goals Re-Evaluation     Row Name 02/14/24 1059             Goals   Current Weight 213 lb 6.5 oz (96.8 kg)       Comment A1c 5.7, Lipids WNL, LDL 97       Expected Outcome Patient has medical history of s/p AVR, CABG x2, hyperlipidemia, HTN, CAD, hypothyroidism. Hospitalization was complicated by renal insufficiency; BMP improving, K+ 5.1. A1c is in a prediabetic range. Lipids WNL but LDL remains above goal. Patient is motivated to lose weight; will continue to address strategies for weight loss including calorie density, the plate method as a guide for meal planning, etc. Patient will benefit from  participation in intensive cardiac rehab for nutrition, exercise, and lifestyle modification.                Nutrition Goals Discharge (Final Nutrition Goals Re-Evaluation):  Nutrition Goals Re-Evaluation - 02/14/24 1059       Goals   Current Weight 213 lb 6.5 oz (96.8 kg)    Comment A1c 5.7, Lipids WNL, LDL 97    Expected Outcome Patient has medical history of s/p AVR, CABG x2, hyperlipidemia, HTN, CAD, hypothyroidism. Hospitalization was complicated by renal insufficiency; BMP improving, K+ 5.1. A1c is in a prediabetic range. Lipids WNL but LDL remains above goal. Patient is motivated to lose weight; will continue to address strategies for weight loss including calorie density, the plate method as a guide for meal planning, etc. Patient will benefit from participation in intensive cardiac rehab for nutrition, exercise, and lifestyle modification.             Psychosocial: Target Goals: Acknowledge presence or absence of significant depression and/or stress, maximize coping skills, provide positive support system. Participant is able to verbalize types and ability to use techniques and skills needed for reducing stress and depression.  Initial Review & Psychosocial Screening:  Initial Psych Review & Screening - 02/04/24 1105       Initial Review   Current issues with None Identified      Family Dynamics   Good Support System? Yes   Pt has spouse for support     Barriers   Psychosocial barriers to participate in program There are no identifiable barriers or psychosocial needs.      Screening Interventions   Interventions Encouraged to exercise             Quality of Life Scores:  Quality of Life - 02/04/24 1321       Quality of Life   Select Quality of  Life      Quality of Life Scores   Health/Function Pre 19.33 %    Socioeconomic Pre 20.36 %    Psych/Spiritual Pre 16.36 %    Family Pre 21.5 %    GLOBAL Pre 19.25 %            Scores of 19 and below  usually indicate a poorer quality of life in these areas.  A difference of  2-3 points is a clinically meaningful difference.  A difference of 2-3 points in the total score of the Quality of Life Index has been associated with significant improvement in overall quality of life, self-image, physical symptoms, and general health in studies assessing change in quality of life.  PHQ-9: Review Flowsheet  More data exists      02/04/2024 07/05/2023 05/27/2023 03/22/2023 03/12/2022  Depression screen PHQ 2/9  Decreased Interest 0 0 0 0 0  Down, Depressed, Hopeless 0 0 0 0 0  PHQ - 2 Score 0 0 0 0 0  Altered sleeping 0 0 0 1 -  Tired, decreased energy 1 0 0 0 -  Change in appetite 0 0 0 0 -  Feeling bad or failure about yourself  0 0 0 0 -  Trouble concentrating 0 0 0 0 -  Moving slowly or fidgety/restless 0 0 0 0 -  Suicidal thoughts 0 0 0 0 -  PHQ-9 Score 1 0 0 1 -  Difficult doing work/chores Somewhat difficult Not difficult at all Not difficult at all Not difficult at all -   Interpretation of Total Score  Total Score Depression Severity:  1-4 = Minimal depression, 5-9 = Mild depression, 10-14 = Moderate depression, 15-19 = Moderately severe depression, 20-27 = Severe depression   Psychosocial Evaluation and Intervention:   Psychosocial Re-Evaluation:  Psychosocial Re-Evaluation     Row Name 02/13/24 0747             Psychosocial Re-Evaluation   Current issues with Current Stress Concerns       Comments [name] did not voice any increased concerns or stressors. Will review quality of life in the upcoming week       Expected Outcomes [name] will have decreased or controlled stressors upon completion of cardiac rehab       Interventions Stress management education;Encouraged to attend Cardiac Rehabilitation for the exercise;Relaxation education       Continue Psychosocial Services  Follow up required by staff         Initial Review   Source of Stress Concerns Chronic Illness        Comments Will continue to montior and offer support as needed                Psychosocial Discharge (Final Psychosocial Re-Evaluation):  Psychosocial Re-Evaluation - 02/13/24 0747       Psychosocial Re-Evaluation   Current issues with Current Stress Concerns    Comments [name] did not voice any increased concerns or stressors. Will review quality of life in the upcoming week    Expected Outcomes [name] will have decreased or controlled stressors upon completion of cardiac rehab    Interventions Stress management education;Encouraged to attend Cardiac Rehabilitation for the exercise;Relaxation education    Continue Psychosocial Services  Follow up required by staff      Initial Review   Source of Stress Concerns Chronic Illness    Comments Will continue to montior and offer support as needed  Vocational Rehabilitation: Provide vocational rehab assistance to qualifying candidates.   Vocational Rehab Evaluation & Intervention:  Vocational Rehab - 02/04/24 1106       Initial Vocational Rehab Evaluation & Intervention   Assessment shows need for Vocational Rehabilitation No   Pt is retired            Education: Education Goals: Education classes will be provided on a weekly basis, covering required topics. Participant will state understanding/return demonstration of topics presented.    Education     Row Name 02/10/24 0900     Education   Cardiac Education Topics Pritikin   Select Workshops     Workshops   Educator Exercise Physiologist   Select Psychosocial   Psychosocial Workshop Healthy Sleep for a Healthy Heart   Instruction Review Code 1- Verbalizes Understanding   Class Start Time [phone]   Class Stop Time 0858   Class Time Calculation (min) 46 min    Row Name 02/12/24 0900     Education   Cardiac Education Topics Pritikin   Secondary school teacher School   Educator Dietitian   Weekly Topic Simple Sides and Sauces   Instruction  Review Code 1- Verbalizes Understanding   Class Start Time 0815   Class Stop Time 0855   Class Time Calculation (min) 40 min    Row Name 02/14/24 0800     Education   Cardiac Education Topics Pritikin   Select Core Videos     Core Videos   Educator Exercise Physiologist   Select Psychosocial   Psychosocial How Our Thoughts Can Heal Our Hearts   Instruction Review Code 1- Verbalizes Understanding   Class Start Time [phone]   Class Stop Time 0848   Class Time Calculation (min) 36 min            Core Videos: Exercise    Move It!  Clinical staff conducted group or individual video education with verbal and written material and guidebook.  Patient learns the recommended Pritikin exercise program. Exercise with the goal of living a long, healthy life. Some of the health benefits of exercise include controlled diabetes, healthier blood pressure levels, improved cholesterol levels, improved heart and lung capacity, improved sleep, and better body composition. Everyone should speak with their doctor before starting or changing an exercise routine.  Biomechanical Limitations Clinical staff conducted group or individual video education with verbal and written material and guidebook.  Patient learns how biomechanical limitations can impact exercise and how we can mitigate and possibly overcome limitations to have an impactful and balanced exercise routine.  Body Composition Clinical staff conducted group or individual video education with verbal and written material and guidebook.  Patient learns that body composition (ratio of muscle mass to fat mass) is a key component to assessing overall fitness, rather than body weight alone. Increased fat mass, especially visceral belly fat, can put us  at increased risk for metabolic syndrome, type 2 diabetes, heart disease, and even death. It is recommended to combine diet and exercise (cardiovascular and resistance training) to improve your body  composition. Seek guidance from your physician and exercise physiologist before implementing an exercise routine.  Exercise Action Plan Clinical staff conducted group or individual video education with verbal and written material and guidebook.  Patient learns the recommended strategies to achieve and enjoy long-term exercise adherence, including variety, self-motivation, self-efficacy, and positive decision making. Benefits of exercise include fitness, good health, weight management, more energy, better sleep, less stress, and  overall well-being.  Medical   Heart Disease Risk Reduction Clinical staff conducted group or individual video education with verbal and written material and guidebook.  Patient learns our heart is our most vital organ as it circulates oxygen, nutrients, white blood cells, and hormones throughout the entire body, and carries waste away. Data supports a plant-based eating plan like the Pritikin Program for its effectiveness in slowing progression of and reversing heart disease. The video provides a number of recommendations to address heart disease.   Metabolic Syndrome and Belly Fat  Clinical staff conducted group or individual video education with verbal and written material and guidebook.  Patient learns what metabolic syndrome is, how it leads to heart disease, and how one can reverse it and keep it from coming back. You have metabolic syndrome if you have 3 of the following 5 criteria: abdominal obesity, high blood pressure, high triglycerides, low HDL cholesterol, and high blood sugar.  Hypertension and Heart Disease Clinical staff conducted group or individual video education with verbal and written material and guidebook.  Patient learns that high blood pressure, or hypertension, is very common in the [place] . Hypertension is largely due to excessive salt intake, but other important risk factors include being overweight, physical inactivity, drinking too much  alcohol, smoking, and not eating enough potassium from fruits and vegetables. High blood pressure is a leading risk factor for heart attack, stroke, congestive heart failure, dementia, kidney failure, and premature death. Long-term effects of excessive salt intake include stiffening of the arteries and thickening of heart muscle and organ damage. Recommendations include ways to reduce hypertension and the risk of heart disease.  Diseases of Our Time - Focusing on Diabetes Clinical staff conducted group or individual video education with verbal and written material and guidebook.  Patient learns why the best way to stop diseases of our time is prevention, through food and other lifestyle changes. Medicine (such as prescription pills and surgeries) is often only a Band-Aid on the problem, not a long-term solution. Most common diseases of our time include obesity, type 2 diabetes, hypertension, heart disease, and cancer. The Pritikin Program is recommended and has been proven to help reduce, reverse, and/or prevent the damaging effects of metabolic syndrome.  Nutrition   Overview of the Pritikin Eating Plan  Clinical staff conducted group or individual video education with verbal and written material and guidebook.  Patient learns about the Pritikin Eating Plan for disease risk reduction. The Pritikin Eating Plan emphasizes a wide variety of unrefined, minimally-processed carbohydrates, like fruits, vegetables, whole grains, and legumes. Go, Caution, and Stop food choices are explained. Plant-based and lean animal proteins are emphasized. Rationale provided for low sodium intake for blood pressure control, low added sugars for blood sugar stabilization, and low added fats and oils for coronary artery disease risk reduction and weight management.  Calorie Density  Clinical staff conducted group or individual video education with verbal and written material and guidebook.  Patient learns about calorie  density and how it impacts the Pritikin Eating Plan. Knowing the characteristics of the food you choose will help you decide whether those foods will lead to weight gain or weight loss, and whether you want to consume more or less of them. Weight loss is usually a side effect of the Pritikin Eating Plan because of its focus on low calorie-dense foods.  Label Reading  Clinical staff conducted group or individual video education with verbal and written material and guidebook.  Patient learns about the Pritikin recommended  label reading guidelines and corresponding recommendations regarding calorie density, added sugars, sodium content, and whole grains.  Dining Out - Part 1  Clinical staff conducted group or individual video education with verbal and written material and guidebook.  Patient learns that restaurant meals can be sabotaging because they can be so high in calories, fat, sodium, and/or sugar. Patient learns recommended strategies on how to positively address this and avoid unhealthy pitfalls.  Facts on Fats  Clinical staff conducted group or individual video education with verbal and written material and guidebook.  Patient learns that lifestyle modifications can be just as effective, if not more so, as many medications for lowering your risk of heart disease. A Pritikin lifestyle can help to reduce your risk of inflammation and atherosclerosis (cholesterol build-up, or plaque, in the artery walls). Lifestyle interventions such as dietary choices and physical activity address the cause of atherosclerosis. A review of the types of fats and their impact on blood cholesterol levels, along with dietary recommendations to reduce fat intake is also included.  Nutrition Action Plan  Clinical staff conducted group or individual video education with verbal and written material and guidebook.  Patient learns how to incorporate Pritikin recommendations into their lifestyle. Recommendations include  planning and keeping personal health goals in mind as an important part of their success.  Healthy Mind-Set    Healthy Minds, Bodies, Hearts  Clinical staff conducted group or individual video education with verbal and written material and guidebook.  Patient learns how to identify when they are stressed. Video will discuss the impact of that stress, as well as the many benefits of stress management. Patient will also be introduced to stress management techniques. The way we think, act, and feel has an impact on our hearts.  How Our Thoughts Can Heal Our Hearts  Clinical staff conducted group or individual video education with verbal and written material and guidebook.  Patient learns that negative thoughts can cause depression and anxiety. This can result in negative lifestyle behavior and serious health problems. Cognitive behavioral therapy is an effective method to help control our thoughts in order to change and improve our emotional outlook.  Additional Videos:  Exercise    Improving Performance  Clinical staff conducted group or individual video education with verbal and written material and guidebook.  Patient learns to use a non-linear approach by alternating intensity levels and lengths of time spent exercising to help burn more calories and lose more body fat. Cardiovascular exercise helps improve heart health, metabolism, hormonal balance, blood sugar control, and recovery from fatigue. Resistance training improves strength, endurance, balance, coordination, reaction time, metabolism, and muscle mass. Flexibility exercise improves circulation, posture, and balance. Seek guidance from your physician and exercise physiologist before implementing an exercise routine and learn your capabilities and proper form for all exercise.  Introduction to Yoga  Clinical staff conducted group or individual video education with verbal and written material and guidebook.  Patient learns about yoga, a  discipline of the coming together of mind, breath, and body. The benefits of yoga include improved flexibility, improved range of motion, better posture and core strength, increased lung function, weight loss, and positive self-image. Yoga's heart health benefits include lowered blood pressure, healthier heart rate, decreased cholesterol and triglyceride levels, improved immune function, and reduced stress. Seek guidance from your physician and exercise physiologist before implementing an exercise routine and learn your capabilities and proper form for all exercise.  Medical   Aging: Enhancing Your Quality of Life  Clinical  staff conducted group or individual video education with verbal and written material and guidebook.  Patient learns key strategies and recommendations to stay in good physical health and enhance quality of life, such as prevention strategies, having an advocate, securing a Health Care Proxy and Power of Attorney, and keeping a list of medications and system for tracking them. It also discusses how to avoid risk for bone loss.  Biology of Weight Control  Clinical staff conducted group or individual video education with verbal and written material and guidebook.  Patient learns that weight gain occurs because we consume more calories than we burn (eating more, moving less). Even if your body weight is normal, you may have higher ratios of fat compared to muscle mass. Too much body fat puts you at increased risk for cardiovascular disease, heart attack, stroke, type 2 diabetes, and obesity-related cancers. In addition to exercise, following the Pritikin Eating Plan can help reduce your risk.  Decoding Lab Results  Clinical staff conducted group or individual video education with verbal and written material and guidebook.  Patient learns that lab test reflects one measurement whose values change over time and are influenced by many factors, including medication, stress, sleep, exercise,  food, hydration, pre-existing medical conditions, and more. It is recommended to use the knowledge from this video to become more involved with your lab results and evaluate your numbers to speak with your doctor.   Diseases of Our Time - Overview  Clinical staff conducted group or individual video education with verbal and written material and guidebook.  Patient learns that according to the CDC, 50% to 70% of chronic diseases (such as obesity, type 2 diabetes, elevated lipids, hypertension, and heart disease) are avoidable through lifestyle improvements including healthier food choices, listening to satiety cues, and increased physical activity.  Sleep Disorders Clinical staff conducted group or individual video education with verbal and written material and guidebook.  Patient learns how good quality and duration of sleep are important to overall health and well-being. Patient also learns about sleep disorders and how they impact health along with recommendations to address them, including discussing with a physician.  Nutrition  Dining Out - Part 2 Clinical staff conducted group or individual video education with verbal and written material and guidebook.  Patient learns how to plan ahead and communicate in order to maximize their dining experience in a healthy and nutritious manner. Included are recommended food choices based on the type of restaurant the patient is visiting.   Fueling a Banker conducted group or individual video education with verbal and written material and guidebook.  There is a strong connection between our food choices and our health. Diseases like obesity and type 2 diabetes are very prevalent and are in large-part due to lifestyle choices. The Pritikin Eating Plan provides plenty of food and hunger-curbing satisfaction. It is easy to follow, affordable, and helps reduce health risks.  Menu Workshop  Clinical staff conducted group or individual  video education with verbal and written material and guidebook.  Patient learns that restaurant meals can sabotage health goals because they are often packed with calories, fat, sodium, and sugar. Recommendations include strategies to plan ahead and to communicate with the manager, chef, or server to help order a healthier meal.  Planning Your Eating Strategy  Clinical staff conducted group or individual video education with verbal and written material and guidebook.  Patient learns about the Pritikin Eating Plan and its benefit of reducing the risk of  disease. The Pritikin Eating Plan does not focus on calories. Instead, it emphasizes high-quality, nutrient-rich foods. By knowing the characteristics of the foods, we choose, we can determine their calorie density and make informed decisions.  Targeting Your Nutrition Priorities  Clinical staff conducted group or individual video education with verbal and written material and guidebook.  Patient learns that lifestyle habits have a tremendous impact on disease risk and progression. This video provides eating and physical activity recommendations based on your personal health goals, such as reducing LDL cholesterol, losing weight, preventing or controlling type 2 diabetes, and reducing high blood pressure.  Vitamins and Minerals  Clinical staff conducted group or individual video education with verbal and written material and guidebook.  Patient learns different ways to obtain key vitamins and minerals, including through a recommended healthy diet. It is important to discuss all supplements you take with your doctor.   Healthy Mind-Set    Smoking Cessation  Clinical staff conducted group or individual video education with verbal and written material and guidebook.  Patient learns that cigarette smoking and tobacco addiction pose a serious health risk which affects millions of people. Stopping smoking will significantly reduce the risk of heart  disease, lung disease, and many forms of cancer. Recommended strategies for quitting are covered, including working with your doctor to develop a successful plan.  Culinary   Becoming a Set designer conducted group or individual video education with verbal and written material and guidebook.  Patient learns that cooking at home can be healthy, cost-effective, quick, and puts them in control. Keys to cooking healthy recipes will include looking at your recipe, assessing your equipment needs, planning ahead, making it simple, choosing cost-effective seasonal ingredients, and limiting the use of added fats, salts, and sugars.  Cooking - Breakfast and Snacks  Clinical staff conducted group or individual video education with verbal and written material and guidebook.  Patient learns how important breakfast is to satiety and nutrition through the entire day. Recommendations include key foods to eat during breakfast to help stabilize blood sugar levels and to prevent overeating at meals later in the day. Planning ahead is also a key component.  Cooking - Educational psychologist conducted group or individual video education with verbal and written material and guidebook.  Patient learns eating strategies to improve overall health, including an approach to cook more at home. Recommendations include thinking of animal protein as a side on your plate rather than center stage and focusing instead on lower calorie dense options like vegetables, fruits, whole grains, and plant-based proteins, such as beans. Making sauces in large quantities to freeze for later and leaving the skin on your vegetables are also recommended to maximize your experience.  Cooking - Healthy Salads and Dressing Clinical staff conducted group or individual video education with verbal and written material and guidebook.  Patient learns that vegetables, fruits, whole grains, and legumes are the foundations of the  Pritikin Eating Plan. Recommendations include how to incorporate each of these in flavorful and healthy salads, and how to create homemade salad dressings. Proper handling of ingredients is also covered. Cooking - Soups and [employer] - Soups and Desserts Clinical staff conducted group or individual video education with verbal and written material and guidebook.  Patient learns that Pritikin soups and desserts make for easy, nutritious, and delicious snacks and meal components that are low in sodium, fat, sugar, and calorie density, while high in vitamins, minerals, and filling fiber.  Recommendations include simple and healthy ideas for soups and desserts.   Overview     The Pritikin Solution Program Overview Clinical staff conducted group or individual video education with verbal and written material and guidebook.  Patient learns that the results of the Pritikin Program have been documented in more than 100 articles published in peer-reviewed journals, and the benefits include reducing risk factors for (and, in some cases, even reversing) high cholesterol, high blood pressure, type 2 diabetes, obesity, and more! An overview of the three key pillars of the Pritikin Program will be covered: eating well, doing regular exercise, and having a healthy mind-set.  WORKSHOPS  Exercise: Exercise Basics: Building Your Action Plan Clinical staff led group instruction and group discussion with PowerPoint presentation and patient guidebook. To enhance the learning environment the use of posters, models and videos may be added. At the conclusion of this workshop, patients will comprehend the difference between physical activity and exercise, as well as the benefits of incorporating both, into their routine. Patients will understand the FITT (Frequency, Intensity, Time, and Type) principle and how to use it to build an exercise action plan. In addition, safety concerns and other considerations for  exercise and cardiac rehab will be addressed by the presenter. The purpose of this lesson is to promote a comprehensive and effective weekly exercise routine in order to improve patients' overall level of fitness.   Managing Heart Disease: Your Path to a Healthier Heart Clinical staff led group instruction and group discussion with PowerPoint presentation and patient guidebook. To enhance the learning environment the use of posters, models and videos may be added.At the conclusion of this workshop, patients will understand the anatomy and physiology of the heart. Additionally, they will understand how Pritikin's three pillars impact the risk factors, the progression, and the management of heart disease.  The purpose of this lesson is to provide a high-level overview of the heart, heart disease, and how the Pritikin lifestyle positively impacts risk factors.  Exercise Biomechanics Clinical staff led group instruction and group discussion with PowerPoint presentation and patient guidebook. To enhance the learning environment the use of posters, models and videos may be added. Patients will learn how the structural parts of their bodies function and how these functions impact their daily activities, movement, and exercise. Patients will learn how to promote a neutral spine, learn how to manage pain, and identify ways to improve their physical movement in order to promote healthy living. The purpose of this lesson is to expose patients to common physical limitations that impact physical activity. Participants will learn practical ways to adapt and manage aches and pains, and to minimize their effect on regular exercise. Patients will learn how to maintain good posture while sitting, walking, and lifting.  Balance Training and Fall Prevention  Clinical staff led group instruction and group discussion with PowerPoint presentation and patient guidebook. To enhance the learning environment the use of  posters, models and videos may be added. At the conclusion of this workshop, patients will understand the importance of their sensorimotor skills (vision, proprioception, and the vestibular system) in maintaining their ability to balance as they age. Patients will apply a variety of balancing exercises that are appropriate for their current level of function. Patients will understand the common causes for poor balance, possible solutions to these problems, and ways to modify their physical environment in order to minimize their fall risk. The purpose of this lesson is to teach patients about the importance of maintaining balance as  they age and ways to minimize their risk of falling.  WORKSHOPS   Nutrition:  Fueling a Ship broker led group instruction and group discussion with PowerPoint presentation and patient guidebook. To enhance the learning environment the use of posters, models and videos may be added. Patients will review the foundational principles of the Pritikin Eating Plan and understand what constitutes a serving size in each of the food groups. Patients will also learn Pritikin-friendly foods that are better choices when away from home and review make-ahead meal and snack options. Calorie density will be reviewed and applied to three nutrition priorities: weight maintenance, weight loss, and weight gain. The purpose of this lesson is to reinforce (in a group setting) the key concepts around what patients are recommended to eat and how to apply these guidelines when away from home by planning and selecting Pritikin-friendly options. Patients will understand how calorie density may be adjusted for different weight management goals.  Mindful Eating  Clinical staff led group instruction and group discussion with PowerPoint presentation and patient guidebook. To enhance the learning environment the use of posters, models and videos may be added. Patients will briefly review the  concepts of the Pritikin Eating Plan and the importance of low-calorie dense foods. The concept of mindful eating will be introduced as well as the importance of paying attention to internal hunger signals. Triggers for non-hunger eating and techniques for dealing with triggers will be explored. The purpose of this lesson is to provide patients with the opportunity to review the basic principles of the Pritikin Eating Plan, discuss the value of eating mindfully and how to measure internal cues of hunger and fullness using the Hunger Scale. Patients will also discuss reasons for non-hunger eating and learn strategies to use for controlling emotional eating.  Targeting Your Nutrition Priorities Clinical staff led group instruction and group discussion with PowerPoint presentation and patient guidebook. To enhance the learning environment the use of posters, models and videos may be added. Patients will learn how to determine their genetic susceptibility to disease by reviewing their family history. Patients will gain insight into the importance of diet as part of an overall healthy lifestyle in mitigating the impact of genetics and other environmental insults. The purpose of this lesson is to provide patients with the opportunity to assess their personal nutrition priorities by looking at their family history, their own health history and current risk factors. Patients will also be able to discuss ways of prioritizing and modifying the Pritikin Eating Plan for their highest risk areas  Menu  Clinical staff led group instruction and group discussion with PowerPoint presentation and patient guidebook. To enhance the learning environment the use of posters, models and videos may be added. Using menus brought in from [employer], or printed from [employer], patients will apply the Pritikin dining out guidelines that were presented in the [employer] video. Patients will also be able to  practice these guidelines in a variety of provided scenarios. The purpose of this lesson is to provide patients with the opportunity to practice hands-on learning of the Pritikin Dining Out guidelines with actual menus and practice scenarios.  Label Reading Clinical staff led group instruction and group discussion with PowerPoint presentation and patient guidebook. To enhance the learning environment the use of posters, models and videos may be added. Patients will review and discuss the Pritikin label reading guidelines presented in Pritikin's Label Reading Educational series video. Using fool labels brought in from local  grocery stores and markets, patients will apply the label reading guidelines and determine if the packaged food meet the Pritikin guidelines. The purpose of this lesson is to provide patients with the opportunity to review, discuss, and practice hands-on learning of the Pritikin Label Reading guidelines with actual packaged food labels. Cooking School  Pritikin's [employer] are designed to teach patients ways to prepare quick, simple, and affordable recipes at home. The importance of nutrition's role in chronic disease risk reduction is reflected in its emphasis in the overall Pritikin program. By learning how to prepare essential core Pritikin Eating Plan recipes, patients will increase control over what they eat; be able to customize the flavor of foods without the use of added salt, sugar, or fat; and improve the quality of the food they consume. By learning a set of core recipes which are easily assembled, quickly prepared, and affordable, patients are more likely to prepare more healthy foods at home. These workshops focus on convenient breakfasts, simple entres, side dishes, and desserts which can be prepared with minimal effort and are consistent with nutrition recommendations for cardiovascular risk reduction. Cooking [employer] are taught by a Interior and spatial designer (RD) who has been trained by the [employer]. The chef or RD has a clear understanding of the importance of minimizing - if not completely eliminating - added fat, sugar, and sodium in recipes. Throughout the series of Cooking School Workshop sessions, patients will learn about healthy ingredients and efficient methods of cooking to build confidence in their capability to prepare    Cooking School weekly topics:  Adding Flavor- Sodium-Free  Fast and Healthy Breakfasts  Powerhouse Plant-Based Proteins  Satisfying Salads and Dressings  Simple Sides and Sauces  International Cuisine-Spotlight on the [employer] Zones  Delicious Desserts  Savory Soups  [employer] - Meals in a Astronomer Appetizers and Snacks  Comforting Weekend Breakfasts  One-Pot Wonders   Fast Evening Meals  Landscape architect Your Pritikin Plate  WORKSHOPS   Healthy Mindset (Psychosocial):  Focused Goals, Sustainable Changes Clinical staff led group instruction and group discussion with PowerPoint presentation and patient guidebook. To enhance the learning environment the use of posters, models and videos may be added. Patients will be able to apply effective goal setting strategies to establish at least one personal goal, and then take consistent, meaningful action toward that goal. They will learn to identify common barriers to achieving personal goals and develop strategies to overcome them. Patients will also gain an understanding of how our mind-set can impact our ability to achieve goals and the importance of cultivating a positive and growth-oriented mind-set. The purpose of this lesson is to provide patients with a deeper understanding of how to set and achieve personal goals, as well as the tools and strategies needed to overcome common obstacles which may arise along the way.  From Head to Heart: The Power of a Healthy Outlook  Clinical staff led group instruction and  group discussion with PowerPoint presentation and patient guidebook. To enhance the learning environment the use of posters, models and videos may be added. Patients will be able to recognize and describe the impact of emotions and mood on physical health. They will discover the importance of self-care and explore self-care practices which may work for them. Patients will also learn how to utilize the 4 C's to cultivate a healthier outlook and better manage stress and challenges. The purpose of this lesson is to demonstrate to  patients how a healthy outlook is an essential part of maintaining good health, especially as they continue their cardiac rehab journey.  Healthy Sleep for a Healthy Heart Clinical staff led group instruction and group discussion with PowerPoint presentation and patient guidebook. To enhance the learning environment the use of posters, models and videos may be added. At the conclusion of this workshop, patients will be able to demonstrate knowledge of the importance of sleep to overall health, well-being, and quality of life. They will understand the symptoms of, and treatments for, common sleep disorders. Patients will also be able to identify daytime and nighttime behaviors which impact sleep, and they will be able to apply these tools to help manage sleep-related challenges. The purpose of this lesson is to provide patients with a general overview of sleep and outline the importance of quality sleep. Patients will learn about a few of the most common sleep disorders. Patients will also be introduced to the concept of "sleep hygiene," and discover ways to self-manage certain sleeping problems through simple daily behavior changes. Finally, the workshop will motivate patients by clarifying the links between quality sleep and their goals of heart-healthy living.   Recognizing and Reducing Stress Clinical staff led group instruction and group discussion with PowerPoint presentation and  patient guidebook. To enhance the learning environment the use of posters, models and videos may be added. At the conclusion of this workshop, patients will be able to understand the types of stress reactions, differentiate between acute and chronic stress, and recognize the impact that chronic stress has on their health. They will also be able to apply different coping mechanisms, such as reframing negative self-talk. Patients will have the opportunity to practice a variety of stress management techniques, such as deep abdominal breathing, progressive muscle relaxation, and/or guided imagery.  The purpose of this lesson is to educate patients on the role of stress in their lives and to provide healthy techniques for coping with it.  Learning Barriers/Preferences:  Learning Barriers/Preferences - 02/04/24 1321       Learning Barriers/Preferences   Learning Barriers Sight    Learning Preferences Video;Pictoral;Computer/Internet             Education Topics:  Knowledge Questionnaire Score:  Knowledge Questionnaire Score - 02/04/24 1322       Knowledge Questionnaire Score   Pre Score 22/24             Core Components/Risk Factors/Patient Goals at Admission:  Personal Goals and Risk Factors at Admission - 02/04/24 1106       Core Components/Risk Factors/Patient Goals on Admission    Weight Management Yes;Weight Loss;Obesity    Intervention Weight Management: Develop a combined nutrition and exercise program designed to reach desired caloric intake, while maintaining appropriate intake of nutrient and fiber, sodium and fats, and appropriate energy expenditure required for the weight goal.;Weight Management: Provide education and appropriate resources to help participant work on and attain dietary goals.;Weight Management/Obesity: Establish reasonable short term and long term weight goals.    Admit Weight 212 lb 11.9 oz (96.5 kg)    Expected Outcomes Short Term: Continue to assess  and modify interventions until short term weight is achieved;Long Term: Adherence to nutrition and physical activity/exercise program aimed toward attainment of established weight goal;Weight Loss: Understanding of general recommendations for a balanced deficit meal plan, which promotes 1-2 lb weight loss per week and includes a negative energy balance of [id number] kcal/d;Understanding recommendations for meals to include 15-35% energy as protein, 25-35% energy  from fat, 35-60% energy from carbohydrates, less than 200mg  of dietary cholesterol, 20-35 gm of total fiber daily;Understanding of distribution of calorie intake throughout the day with the consumption of 4-5 meals/snacks    Hypertension Yes    Intervention Provide education on lifestyle modifcations including regular physical activity/exercise, weight management, moderate sodium restriction and increased consumption of fresh fruit, vegetables, and low fat dairy, alcohol moderation, and smoking cessation.;Monitor prescription use compliance.    Expected Outcomes Short Term: Continued assessment and intervention until BP is < 140/60mm HG in hypertensive participants. < 130/98mm HG in hypertensive participants with diabetes, heart failure or chronic kidney disease.;Long Term: Maintenance of blood pressure at goal levels.    Lipids Yes    Intervention Provide education and support for participant on nutrition & aerobic/resistive exercise along with prescribed medications to achieve LDL 70mg , HDL >40mg .    Expected Outcomes Short Term: Participant states understanding of desired cholesterol values and is compliant with medications prescribed. Participant is following exercise prescription and nutrition guidelines.;Long Term: Cholesterol controlled with medications as prescribed, with individualized exercise RX and with personalized nutrition plan. Value goals: LDL < 70mg , HDL > 40 mg.             Core Components/Risk Factors/Patient Goals Review:    Goals and Risk Factor Review     Row Name 02/13/24 0755             Core Components/Risk Factors/Patient Goals Review   Personal Goals Review Weight Management/Obesity;Hypertension;Lipids       Review [name] started cardiac rehab on 02/10/24. [name] is off to a good start to exercise. Some moderate exertional systolic  BP elevations noted will continue to monitor       Expected Outcomes [name] will continue to participate in cardiac rehab for exercise, nutrition and lifestyle modifications                Core Components/Risk Factors/Patient Goals at Discharge (Final Review):   Goals and Risk Factor Review - 02/13/24 0755       Core Components/Risk Factors/Patient Goals Review   Personal Goals Review Weight Management/Obesity;Hypertension;Lipids    Review [name] started cardiac rehab on 02/10/24. [name] is off to a good start to exercise. Some moderate exertional systolic  BP elevations noted will continue to monitor    Expected Outcomes [name] will continue to participate in cardiac rehab for exercise, nutrition and lifestyle modifications             ITP Comments:  ITP Comments     Row Name 02/04/24 1112 02/13/24 0743         ITP Comments Sophia Dustman turner, MD: Medical Director.  Intorduction to the [facility] / Intensive Cardiac Rehab.  Initial orientation packet reviewed with the patient. 30 Day ITP Review. [name] started cardiac rehab on 02/10/24. [name] is off to a good start  with exercise.               Comments: See ITP comment.

## 2024-02-18 ENCOUNTER — Other Ambulatory Visit: Payer: Self-pay | Admitting: Family Medicine

## 2024-02-19 ENCOUNTER — Encounter (HOSPITAL_COMMUNITY)
Admission: RE | Admit: 2024-02-19 | Discharge: 2024-02-19 | Discharge status: Home / Self Care | Source: Ambulatory Visit | Attending: Cardiology

## 2024-02-19 ENCOUNTER — Other Ambulatory Visit: Payer: Self-pay | Admitting: Family Medicine

## 2024-02-19 DIAGNOSIS — Z951 Presence of aortocoronary bypass graft: Secondary | ICD-10-CM

## 2024-02-19 DIAGNOSIS — Z952 Presence of prosthetic heart valve: Secondary | ICD-10-CM

## 2024-02-21 ENCOUNTER — Encounter (HOSPITAL_COMMUNITY)
Admission: RE | Admit: 2024-02-21 | Discharge: 2024-02-21 | Disposition: A | Discharge status: Home / Self Care | Source: Ambulatory Visit | Attending: Cardiology | Admitting: Cardiology

## 2024-02-21 DIAGNOSIS — Z951 Presence of aortocoronary bypass graft: Secondary | ICD-10-CM

## 2024-02-21 DIAGNOSIS — Z952 Presence of prosthetic heart valve: Secondary | ICD-10-CM

## 2024-02-24 ENCOUNTER — Encounter (HOSPITAL_COMMUNITY)
Admission: RE | Admit: 2024-02-24 | Discharge: 2024-02-24 | Disposition: A | Discharge status: Home / Self Care | Source: Ambulatory Visit | Attending: Cardiology | Admitting: Cardiology

## 2024-02-24 DIAGNOSIS — Z952 Presence of prosthetic heart valve: Secondary | ICD-10-CM

## 2024-02-24 DIAGNOSIS — Z951 Presence of aortocoronary bypass graft: Secondary | ICD-10-CM

## 2024-02-26 ENCOUNTER — Encounter (HOSPITAL_COMMUNITY)
Admission: RE | Admit: 2024-02-26 | Discharge: 2024-02-26 | Disposition: A | Discharge status: Home / Self Care | Source: Ambulatory Visit | Attending: Cardiology

## 2024-02-26 DIAGNOSIS — Z951 Presence of aortocoronary bypass graft: Secondary | ICD-10-CM | POA: Diagnosis not present

## 2024-02-26 DIAGNOSIS — Z952 Presence of prosthetic heart valve: Secondary | ICD-10-CM

## 2024-02-26 DIAGNOSIS — I77810 Thoracic aortic ectasia: Secondary | ICD-10-CM

## 2024-02-28 ENCOUNTER — Encounter (HOSPITAL_COMMUNITY)
Admission: RE | Admit: 2024-02-28 | Discharge: 2024-02-28 | Disposition: A | Discharge status: Home / Self Care | Source: Ambulatory Visit | Attending: Cardiology | Admitting: Cardiology

## 2024-02-28 DIAGNOSIS — Z951 Presence of aortocoronary bypass graft: Secondary | ICD-10-CM | POA: Diagnosis present

## 2024-02-28 DIAGNOSIS — Z952 Presence of prosthetic heart valve: Secondary | ICD-10-CM | POA: Diagnosis present

## 2024-02-28 DIAGNOSIS — I77810 Thoracic aortic ectasia: Secondary | ICD-10-CM | POA: Diagnosis present

## 2024-03-02 ENCOUNTER — Encounter (HOSPITAL_COMMUNITY)
Admission: RE | Admit: 2024-03-02 | Discharge: 2024-03-02 | Disposition: A | Discharge status: Home / Self Care | Source: Ambulatory Visit | Attending: Cardiology | Admitting: Cardiology

## 2024-03-02 DIAGNOSIS — I77810 Thoracic aortic ectasia: Secondary | ICD-10-CM

## 2024-03-02 DIAGNOSIS — Z952 Presence of prosthetic heart valve: Secondary | ICD-10-CM

## 2024-03-02 DIAGNOSIS — Z951 Presence of aortocoronary bypass graft: Secondary | ICD-10-CM

## 2024-03-03 ENCOUNTER — Telehealth (HOSPITAL_COMMUNITY): Payer: Self-pay | Admitting: *Deleted

## 2024-03-03 NOTE — Progress Notes (Signed)
 Intermittent resting and exertional systolic BP elevations noted at cardiac rehab. Patient asymptomatic. Will forward BP readings to Dr Lorie Rook for review and continue to monitor the patient. Systolic BP's noted at 140-163, diastolic BP 80-89. [name] has a follow up appointment with Dr Lorie Rook on 03/25/24.  Monte Antonio RN BSN

## 2024-03-03 NOTE — Telephone Encounter (Signed)
-----   Message from Arun K Thukkani sent at 03/03/2024 11:23 AM EDT ----- Thanks.  I will address when I see him next. ----- Message ----- From: Cyndee Dragon, RN Sent: 03/03/2024  10:23 AM EDT To: Lowell Rude, RN; Arun K Thukkani, MD  Good morning Dr Lorie Rook, I want to bring it to your attention that Intermittent resting and exertional systolic BP elevations noted at cardiac rehab.  Systolic BP's noted at 140-163, diastolic BP 80-89 noted on 03/02/24. Dee Farber says he is taking his metoprolol  as prescribed. Dee Farber has a follow up appointment with you on 03/25/24.  I appreciate you input! Sincerely, [facility]  Cardiac Rehab

## 2024-03-04 ENCOUNTER — Other Ambulatory Visit (HOSPITAL_BASED_OUTPATIENT_CLINIC_OR_DEPARTMENT_OTHER): Payer: Self-pay | Admitting: Nurse Practitioner

## 2024-03-04 ENCOUNTER — Encounter (HOSPITAL_COMMUNITY)
Admission: RE | Admit: 2024-03-04 | Discharge: 2024-03-04 | Disposition: A | Discharge status: Home / Self Care | Source: Ambulatory Visit | Attending: Cardiology | Admitting: Cardiology

## 2024-03-04 DIAGNOSIS — Z951 Presence of aortocoronary bypass graft: Secondary | ICD-10-CM

## 2024-03-04 DIAGNOSIS — Z952 Presence of prosthetic heart valve: Secondary | ICD-10-CM

## 2024-03-04 MED ORDER — AMLODIPINE BESYLATE 2.5 MG PO TABS: 2.5000 mg | ORAL_TABLET | Freq: Every day | ORAL | 3 refills | Status: DC

## 2024-03-04 NOTE — Progress Notes (Signed)
 Incomplete Session Note  Patient Details  Name: [name] MRN: [id number] Date of Birth: Dec 08, 1945 Referring Provider:   Flowsheet Row INTENSIVE CARDIAC REHAB ORIENT from 02/04/2024 in [facility]  Referring Provider Gaylyn Keas, MD       Inda Manchester did not complete his rehab session.  BP 176/92  with the regular cuff heart rate 80. Telemetry rhythm sinus with a first degree heart block. Recheck BP 172/78. No exercise today due to to elevated BP. Repeat BP 172/78 with the long BP. Onsite provider Slater Duncan NP notified of elevated BP's. Moira Andrews said that she will add 2.5 mg of amlodipine  once a day and call the prescription into [facility] on [address]. Medications reviewed. Dee Farber says he is taking his medications as prescribed. Dee Farber said that he took his medications as 0500. Patient asymptomatic and instructed to keep track of BP's at home and watch sodium intake. Patient states understanding. Dee Farber plans to return to exercise on Friday. Will forward to Dr Lorie Rook. Jeff left cardiac rehab without complaints. Exit BP 152/84 upon recheck.Monte Antonio RN BSN

## 2024-03-06 ENCOUNTER — Encounter (HOSPITAL_COMMUNITY)
Admission: RE | Admit: 2024-03-06 | Discharge: 2024-03-06 | Disposition: A | Discharge status: Home / Self Care | Source: Ambulatory Visit | Attending: Cardiology | Admitting: Cardiology

## 2024-03-06 DIAGNOSIS — Z951 Presence of aortocoronary bypass graft: Secondary | ICD-10-CM

## 2024-03-06 DIAGNOSIS — Z952 Presence of prosthetic heart valve: Secondary | ICD-10-CM

## 2024-03-09 ENCOUNTER — Ambulatory Visit: Payer: Medicare HMO | Admitting: Family Medicine

## 2024-03-09 ENCOUNTER — Encounter (HOSPITAL_COMMUNITY)
Admission: RE | Admit: 2024-03-09 | Discharge: 2024-03-09 | Disposition: A | Discharge status: Home / Self Care | Source: Ambulatory Visit | Attending: Cardiology | Admitting: Cardiology

## 2024-03-09 ENCOUNTER — Encounter: Payer: Medicare HMO | Admitting: Family Medicine

## 2024-03-09 DIAGNOSIS — Z951 Presence of aortocoronary bypass graft: Secondary | ICD-10-CM

## 2024-03-09 DIAGNOSIS — Z952 Presence of prosthetic heart valve: Secondary | ICD-10-CM

## 2024-03-09 NOTE — Progress Notes (Signed)
Reviewed home exercise Rx with patient today.  Encouraged warm-up, cool-down, and stretching. Reviewed THRR of  58 - 116 and keeping RPE between 11-13. Encouraged to hydrate with activity.  Reviewed weather parameters for temperature and humidity for safe exercise outdoors. Reviewed S/S to terminate exercise and when to call 911 vs MD. Pt encouraged to always carry a cell phone for safety when exercising outdoors. Pt verbalized understanding of the home exercise Rx and was provided a copy.   Lorin Picket MS, ACSM-CEP, CCRP

## 2024-03-11 ENCOUNTER — Encounter (HOSPITAL_COMMUNITY)
Admission: RE | Admit: 2024-03-11 | Discharge: 2024-03-11 | Disposition: A | Discharge status: Home / Self Care | Source: Ambulatory Visit | Attending: Cardiology | Admitting: Cardiology

## 2024-03-11 DIAGNOSIS — Z951 Presence of aortocoronary bypass graft: Secondary | ICD-10-CM | POA: Diagnosis not present

## 2024-03-11 DIAGNOSIS — Z952 Presence of prosthetic heart valve: Secondary | ICD-10-CM

## 2024-03-13 ENCOUNTER — Encounter (HOSPITAL_COMMUNITY)
Admission: RE | Admit: 2024-03-13 | Discharge: 2024-03-13 | Disposition: A | Discharge status: Home / Self Care | Source: Ambulatory Visit | Attending: Cardiology | Admitting: Cardiology

## 2024-03-13 DIAGNOSIS — Z952 Presence of prosthetic heart valve: Secondary | ICD-10-CM

## 2024-03-13 DIAGNOSIS — Z951 Presence of aortocoronary bypass graft: Secondary | ICD-10-CM | POA: Diagnosis not present

## 2024-03-13 NOTE — Progress Notes (Signed)
 QUALITY OF LIFE SCORE REVIEW  Pt completed Quality of Life survey as a participant in Cardiac Rehab.  Scores 21.0 or below are considered low.  Pt score very low in several areas Overall 19.25, Health and Function 19.33, socioeconomic 20.36, physiological and spiritual 16.36, family 21.5. Patient quality of life slightly altered by physical constraints which limits ability to perform as prior to recent cardiac illness. [name] denies being depressed. [name] says that his shortness of breath and energy level has improved since he has been participating in cardiac rehab.  Offered emotional support and reassurance.  Will continue to monitor and intervene as necessary.Monte Antonio RN BSN

## 2024-03-13 NOTE — Progress Notes (Signed)
 Cardiac Individual Treatment Plan  Patient Details  Name: [name] MRN: [id number] Date of Birth: 03/01/1946 Referring Provider:   Flowsheet Row INTENSIVE CARDIAC REHAB ORIENT from 02/04/2024 in [facility]  Referring Provider Gaylyn Keas, MD       Initial Encounter Date:  Flowsheet Row INTENSIVE CARDIAC REHAB ORIENT from 02/04/2024 in [facility]  Date 02/04/24       Visit Diagnosis: 12/06/23 S/P CABG x 2  12/06/23 S/P AVR (aortic valve replacement)  Patient's Home Medications on Admission:  Current Outpatient Medications:    amLODipine  (NORVASC ) 2.5 MG tablet, Take 1 tablet (2.5 mg total) by mouth daily., Disp: 90 tablet, Rfl: 3   aspirin  EC 325 MG tablet, Take 1 tablet (325 mg total) by mouth daily. Swallow whole., Disp: , Rfl:    ezetimibe  (ZETIA ) 10 MG tablet, Take 1 tablet (10 mg total) by mouth daily., Disp: 90 tablet, Rfl: 3   levothyroxine  (SYNTHROID ) 25 MCG tablet, TAKE 1 TABLET BY MOUTH EVERY DAY BEFORE BREAKFAST, Disp: 90 tablet, Rfl: 1   metoprolol  tartrate (LOPRESSOR ) 50 MG tablet, Take 1 tablet (50 mg total) by mouth 2 (two) times daily., Disp: 90 tablet, Rfl: 3   Multiple Vitamin (MULTIVITAMIN WITH MINERALS) TABS tablet, Take 1 tablet by mouth in the morning. Centrum Silver, Disp: , Rfl:    rosuvastatin  (CRESTOR ) 40 MG tablet, Take 1 tablet (40 mg total) by mouth daily., Disp: 90 tablet, Rfl: 3  Past Medical History: Past Medical History:  Diagnosis Date   Aortic stenosis    s/p  23 mm Inspiris valve present in the aortic position.  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 12/2023   Back pain    Coronary artery disease    Diverticulosis    Heart murmur    History of colon polyps    Hyperlipidemia    on meds    Hypertension    on meds   Hypothyroidism    Moderate aortic stenosis    Pre-diabetes    Thyroid  disease    on meds    Tobacco Use: Social History   Tobacco Use  Smoking Status Never  Smokeless Tobacco Never    Labs: Review Flowsheet  More data exists      Latest Ref Rng & Units 08/13/2023 11/21/2023 12/04/2023 12/06/2023 12/20/2023  Labs for ITP Cardiac and Pulmonary Rehab  Cholestrol 100 - 199 mg/dL 962  952  - - -  LDL (calc) 0 - 99 mg/dL 841  97  - - -  HDL-C >32 mg/dL 44.01  48  - - -  Trlycerides 0 - 149 mg/dL 027.2  94  - - -  Hemoglobin A1c 4.8 - 5.6 % - - 5.7  - -  PH, Arterial 7.35 - 7.45 - - - 7.391  7.367  7.375  7.331  7.340  7.382  7.349  7.385  7.445  7.383   PCO2 arterial 32 - 48 mmHg - - - 36.6  35.7  39.3  44.0  45.9  41.4  41.7  41.3  28.3  34.9   Bicarbonate 20.0 - 28.0 mmol/L - - - 22.3  20.6  23.2  23.2  24.8  24.6  25.7  23.0  24.7  19.5  20.8   TCO2 22 - 32 mmol/L - - - 23  22  24  25  23  26  24  26  25  27  24  24  26  26  20  22  22    Acid-base deficit 0.0 - 2.0 mmol/L - - - 2.0  4.0  2.0  3.0  1.0  2.0  4.0  4.0   O2 Saturation % - - - 96  96  92  97  100  100  74  100  100  95  100     Details       Multiple values from one day are sorted in reverse-chronological order         Capillary Blood Glucose: Lab Results  Component Value Date   GLUCAP 101 (H) 12/24/2023   GLUCAP 129 (H) 12/24/2023   GLUCAP 115 (H) 12/23/2023   GLUCAP 153 (H) 12/23/2023   GLUCAP 135 (H) 12/23/2023     Exercise Target Goals: Exercise Program Goal: Individual exercise prescription set using results from initial 6 min walk test and THRR while considering  patient's activity barriers and safety.   Exercise Prescription Goal: Initial exercise prescription builds to 30-45 minutes a day of aerobic activity, 2-3 days per week.  Home exercise guidelines will be given to patient during program as part of exercise prescription that the participant will  acknowledge.  Activity Barriers & Risk Stratification:  Activity Barriers & Cardiac Risk Stratification - 02/04/24 1109       Activity Barriers & Cardiac Risk Stratification   Activity Barriers Arthritis;Joint Problems;Back Problems;Shortness of Breath;Balance Concerns    Cardiac Risk Stratification High             6 Minute Walk:  6 Minute Walk     Row Name 02/04/24 1314         6 Minute Walk   Phase Initial     Distance 1590 feet     Walk Time 6 minutes     # of Rest Breaks 0     MPH 3.01     METS 3.34     RPE 11     Perceived Dyspnea  1     VO2 Peak 11.7     Symptoms Yes (comment)     Comments Mild SOB, RPD = 1     Resting HR 84 bpm     Resting BP 130/62     Resting Oxygen Saturation  98 %     Exercise Oxygen Saturation  during 6 min walk 99 %     Max Ex. HR 117 bpm     Max Ex. BP 160/78     2 Minute Post BP 140/76              Oxygen Initial Assessment:   Oxygen Re-Evaluation:   Oxygen Discharge (Final Oxygen Re-Evaluation):   Initial Exercise Prescription:  Initial Exercise Prescription - 02/04/24 1300       Date of Initial Exercise RX and Referring Provider   Date 02/04/24    Referring Provider Gaylyn Keas, MD    Expected Discharge Date 04/29/24      Recumbant Bike   Level 2    RPM 60    Watts 41    Minutes 15    METs 3.3      NuStep   Level 2    SPM 80    Minutes 15    METs 3.3  Prescription Details   Frequency (times per week) 3    Duration Progress to 30 minutes of continuous aerobic without signs/symptoms of physical distress      Intensity   THRR 40-80% of Max Heartrate 58-116    Ratings of Perceived Exertion 11-13    Perceived Dyspnea 0-4      Progression   Progression Continue progressive overload as per policy without signs/symptoms or physical distress.      Resistance Training   Training Prescription Yes    Weight 4    Reps 10-15             Perform Capillary Blood Glucose checks as  needed.  Exercise Prescription Changes:   Exercise Prescription Changes     Row Name 02/10/24 1600 02/21/24 1400 03/09/24 1400         Response to Exercise   Blood Pressure (Admit) 130/72 160/90 142/64     Blood Pressure (Exercise) 150/60 136/62 --     Blood Pressure (Exit) 138/70 130/80 116/70     Heart Rate (Admit) 69 bpm 77 bpm 62 bpm     Heart Rate (Exercise) 108 bpm 123 bpm 103 bpm     Heart Rate (Exit) 85 bpm 89 bpm 76 bpm     Rating of Perceived Exertion (Exercise) 11 11 11      Symptoms None None None     Comments Pt's first day in the CRP2 program Reviewed METs Reviewed METs, goals and HERx     Duration Continue with 30 min of aerobic exercise without signs/symptoms of physical distress. Continue with 30 min of aerobic exercise without signs/symptoms of physical distress. Continue with 30 min of aerobic exercise without signs/symptoms of physical distress.     Intensity THRR unchanged THRR unchanged THRR unchanged       Progression   Progression Continue to progress workloads to maintain intensity without signs/symptoms of physical distress. Continue to progress workloads to maintain intensity without signs/symptoms of physical distress. Continue to progress workloads to maintain intensity without signs/symptoms of physical distress.     Average METs 2.65 3.75 3.8       Resistance Training   Training Prescription Yes Yes Yes     Weight 4 lbs 4 lbs 4 lbs     Reps 10-15 10-15 10-15     Time 10 Minutes 10 Minutes 10 Minutes       Interval Training   Interval Training No No No       Recumbant Bike   Level 2 3 3      RPM 83 83 85     Watts 28 46 51     Minutes 15 15 15      METs 2.5 4.3 3.8       NuStep   Level 2 3 3      SPM 110 116 --     Minutes 15 15 15      METs 2.8 3.2 --       Home Exercise Plan   Plans to continue exercise at -- -- Home (comment)     Frequency -- -- Add 2 additional days to program exercise sessions.     Initial Home Exercises Provided -- --  03/09/24              Exercise Comments:   Exercise Comments     Row Name 02/10/24 1638 02/21/24 1432 03/09/24 0800       Exercise Comments Pt's first day in the [facility] program. Pt exercise with no  complaints and is off to a good start. Reviewed METs today. Pt is making good progress on his MET levels. No changes to exercise Rx today. Reviewed METs, goals and home Exercise Rx. Pt walks his dog but would not consider it aerobic. Encouraged to add 30 minutes of walking 2x/week (without the dog) to reach the 150 minute exercise goal. Pt verbalized unerstnading of home exericse Rx and was provided a copy.              Exercise Goals and Review:   Exercise Goals     Row Name 02/04/24 1108             Exercise Goals   Increase Physical Activity Yes       Intervention Provide advice, education, support and counseling about physical activity/exercise needs.;Develop an individualized exercise prescription for aerobic and resistive training based on initial evaluation findings, risk stratification, comorbidities and participant's personal goals.       Expected Outcomes Short Term: Attend rehab on a regular basis to increase amount of physical activity.;Long Term: Exercising regularly at least 3-5 days a week.;Long Term: Add in home exercise to make exercise part of routine and to increase amount of physical activity.       Increase Strength and Stamina Yes       Intervention Provide advice, education, support and counseling about physical activity/exercise needs.;Develop an individualized exercise prescription for aerobic and resistive training based on initial evaluation findings, risk stratification, comorbidities and participant's personal goals.       Expected Outcomes Short Term: Increase workloads from initial exercise prescription for resistance, speed, and METs.;Short Term: Perform resistance training exercises routinely during rehab and add in resistance training at home;Long  Term: Improve cardiorespiratory fitness, muscular endurance and strength as measured by increased METs and functional capacity ( )       Able to understand and use rate of perceived exertion (RPE) scale Yes       Intervention Provide education and explanation on how to use RPE scale       Expected Outcomes Short Term: Able to use RPE daily in rehab to express subjective intensity level;Long Term:  Able to use RPE to guide intensity level when exercising independently       Knowledge and understanding of Target Heart Rate Range (THRR) Yes       Intervention Provide education and explanation of THRR including how the numbers were predicted and where they are located for reference       Expected Outcomes Short Term: Able to state/look up THRR;Long Term: Able to use THRR to govern intensity when exercising independently;Short Term: Able to use daily as guideline for intensity in rehab       Understanding of Exercise Prescription Yes       Intervention Provide education, explanation, and written materials on patient's individual exercise prescription       Expected Outcomes Short Term: Able to explain program exercise prescription;Long Term: Able to explain home exercise prescription to exercise independently                Exercise Goals Re-Evaluation :  Exercise Goals Re-Evaluation     Row Name 02/10/24 1637 03/09/24 0800           Exercise Goal Re-Evaluation   Exercise Goals Review Increase Physical Activity;Understanding of Exercise Prescription;Increase Strength and Stamina;Knowledge and understanding of Target Heart Rate Range (THRR);Able to understand and use rate of perceived exertion (RPE) scale Increase Physical Activity;Understanding of Exercise Prescription;Increase  Strength and Stamina;Knowledge and understanding of Target Heart Rate Range (THRR);Able to understand and use rate of perceived exertion (RPE) scale      Comments Pt's first day in the CRP2 program. Pt understands the  exercise Rx, RPE scale and THRR. Reviewed METs, goals and HERx with patient today. Pt voices improvement in his goals of improved energy and less SOB. Pt notices his breathing is getting better. Pt has not made much progress on his goal of weight loss, but will have dietician speak with him.      Expected Outcomes will continue to monitor patient and progress exercise workloads as tolerated. will continue to monitor patient and progress exercise workloads as tolerated.               Discharge Exercise Prescription (Final Exercise Prescription Changes):  Exercise Prescription Changes - 03/09/24 1400       Response to Exercise   Blood Pressure (Admit) 142/64    Blood Pressure (Exit) 116/70    Heart Rate (Admit) 62 bpm    Heart Rate (Exercise) 103 bpm    Heart Rate (Exit) 76 bpm    Rating of Perceived Exertion (Exercise) 11    Symptoms None    Comments Reviewed METs, goals and HERx    Duration Continue with 30 min of aerobic exercise without signs/symptoms of physical distress.    Intensity THRR unchanged      Progression   Progression Continue to progress workloads to maintain intensity without signs/symptoms of physical distress.    Average METs 3.8      Resistance Training   Training Prescription Yes    Weight 4 lbs    Reps 10-15    Time 10 Minutes      Interval Training   Interval Training No      Recumbant Bike   Level 3    RPM 85    Watts 51    Minutes 15    METs 3.8      NuStep   Level 3    Minutes 15      Home Exercise Plan   Plans to continue exercise at Home (comment)    Frequency Add 2 additional days to program exercise sessions.    Initial Home Exercises Provided 03/09/24             Nutrition:  Target Goals: Understanding of nutrition guidelines, daily intake of sodium 1500mg , cholesterol 200mg , calories 30% from fat and 7% or less from saturated fats, daily to have 5 or more servings of fruits and vegetables.  Biometrics:  Pre Biometrics -  02/04/24 1040       Pre Biometrics   Waist Circumference 44 inches    Hip Circumference 43.75 inches    Waist to Hip Ratio 1.01 %    Triceps Skinfold 12 mm    % Body Fat 30 %    Grip Strength 22 kg    Flexibility 10.75 in    Single Leg Stand 6.25 seconds              Nutrition Therapy Plan and Nutrition Goals:  Nutrition Therapy & Goals - 03/12/24 1013       Nutrition Therapy   Diet Heart Healthy Diet    Drug/Food Interactions Statins/Certain Fruits      Personal Nutrition Goals   Nutrition Goal Patient to identify strategies for reducing cardiovascular risk by attending the [employer] education and nutrition series weekly.   goal in action   Personal Goal #2  Patient to improve diet quality by using the plate method as a guide for meal planning to include lean protein/plant protein, fruits, vegetables, whole grains, nonfat dairy as part of a well-balanced diet.   goal in action.   Personal Goal #3 Patient to identify strategies for weight loss of 0.5-2.0# per week.   goal in progress.   Comments Patient has medical history of s/p AVR, CABG x2, hyperlipidemia, HTN, CAD, hypothyroidism. Hospitalization was complicated by renal insufficiency; BMP improving, K+ 5.1. A1c is in a prediabetic range. Lipids WNL but LDL remains above goal. Patient is motivated to lose weight; have discussed strategies for weight loss including calorie density, the plate method as a guide for meal planning, etc. He is down 1.1# since starting with our program. Patient will benefit from participation in intensive cardiac rehab for nutrition, exercise, and lifestyle modification.      Intervention Plan   Intervention Prescribe, educate and counsel regarding individualized specific dietary modifications aiming towards targeted core components such as weight, hypertension, lipid management, diabetes, heart failure and other comorbidities.;Nutrition handout(s) given to patient.    Expected Outcomes Short Term  Goal: Understand basic principles of dietary content, such as calories, fat, sodium, cholesterol and nutrients.;Long Term Goal: Adherence to prescribed nutrition plan.             Nutrition Assessments:  Nutrition Assessments - 02/18/24 1518       Rate Your Plate Scores   Pre Score 46            MEDIFICTS Score Key: >=70 Need to make dietary changes  40-70 Heart Healthy Diet <= 40 Therapeutic Level Cholesterol Diet   Flowsheet Row INTENSIVE CARDIAC REHAB from 02/17/2024 in [facility], [facility]  Picture Your Plate Total Score on Admission 46      Picture Your Plate Scores: <78 Unhealthy dietary pattern with much room for improvement. 41-50 Dietary pattern unlikely to meet recommendations for good health and room for improvement. 51-60 More healthful dietary pattern, with some room for improvement.  >60 Healthy dietary pattern, although there may be some specific behaviors that could be improved.    Nutrition Goals Re-Evaluation:  Nutrition Goals Re-Evaluation     Row Name 02/14/24 1059 03/12/24 1013           Goals   Current Weight 213 lb 6.5 oz (96.8 kg) 211 lb 10.3 oz (96 kg)      Comment A1c 5.7, Lipids WNL, LDL 97 no new labs; most recent labs  A1c 5.7, Lipids WNL, LDL 97      Expected Outcome Patient has medical history of s/p AVR, CABG x2, hyperlipidemia, HTN, CAD, hypothyroidism. Hospitalization was complicated by renal insufficiency; BMP improving, K+ 5.1. A1c is in a prediabetic range. Lipids WNL but LDL remains above goal. Patient is motivated to lose weight; will continue to address strategies for weight loss including calorie density, the plate method as a guide for meal planning, etc. Patient will benefit from participation in intensive cardiac rehab for nutrition, exercise, and lifestyle modification. Patient has medical history of s/p AVR, CABG x2, hyperlipidemia, HTN, CAD, hypothyroidism. Hospitalization  was complicated by renal insufficiency; BMP improving, K+ 5.1. A1c is in a prediabetic range. Lipids WNL but LDL remains above goal. Patient is motivated to lose weight; have discussed strategies for weight loss including calorie density, the plate method as a guide for meal planning, etc. He is down 1.1# since starting with our program. Patient  will benefit from participation in intensive cardiac rehab for nutrition, exercise, and lifestyle modification.               Nutrition Goals Re-Evaluation:  Nutrition Goals Re-Evaluation     Row Name 02/14/24 1059 03/12/24 1013           Goals   Current Weight 213 lb 6.5 oz (96.8 kg) 211 lb 10.3 oz (96 kg)      Comment A1c 5.7, Lipids WNL, LDL 97 no new labs; most recent labs  A1c 5.7, Lipids WNL, LDL 97      Expected Outcome Patient has medical history of s/p AVR, CABG x2, hyperlipidemia, HTN, CAD, hypothyroidism. Hospitalization was complicated by renal insufficiency; BMP improving, K+ 5.1. A1c is in a prediabetic range. Lipids WNL but LDL remains above goal. Patient is motivated to lose weight; will continue to address strategies for weight loss including calorie density, the plate method as a guide for meal planning, etc. Patient will benefit from participation in intensive cardiac rehab for nutrition, exercise, and lifestyle modification. Patient has medical history of s/p AVR, CABG x2, hyperlipidemia, HTN, CAD, hypothyroidism. Hospitalization was complicated by renal insufficiency; BMP improving, K+ 5.1. A1c is in a prediabetic range. Lipids WNL but LDL remains above goal. Patient is motivated to lose weight; have discussed strategies for weight loss including calorie density, the plate method as a guide for meal planning, etc. He is down 1.1# since starting with our program. Patient will benefit from participation in intensive cardiac rehab for nutrition, exercise, and lifestyle modification.               Nutrition Goals Discharge (Final  Nutrition Goals Re-Evaluation):  Nutrition Goals Re-Evaluation - 03/12/24 1013       Goals   Current Weight 211 lb 10.3 oz (96 kg)    Comment no new labs; most recent labs  A1c 5.7, Lipids WNL, LDL 97    Expected Outcome Patient has medical history of s/p AVR, CABG x2, hyperlipidemia, HTN, CAD, hypothyroidism. Hospitalization was complicated by renal insufficiency; BMP improving, K+ 5.1. A1c is in a prediabetic range. Lipids WNL but LDL remains above goal. Patient is motivated to lose weight; have discussed strategies for weight loss including calorie density, the plate method as a guide for meal planning, etc. He is down 1.1# since starting with our program. Patient will benefit from participation in intensive cardiac rehab for nutrition, exercise, and lifestyle modification.             Psychosocial: Target Goals: Acknowledge presence or absence of significant depression and/or stress, maximize coping skills, provide positive support system. Participant is able to verbalize types and ability to use techniques and skills needed for reducing stress and depression.  Initial Review & Psychosocial Screening:  Initial Psych Review & Screening - 02/04/24 1105       Initial Review   Current issues with None Identified      Family Dynamics   Good Support System? Yes   Pt has spouse for support     Barriers   Psychosocial barriers to participate in program There are no identifiable barriers or psychosocial needs.      Screening Interventions   Interventions Encouraged to exercise             Quality of Life Scores:  Quality of Life - 02/04/24 1321       Quality of Life   Select Quality of Life      Quality of Life  Scores   Health/Function Pre 19.33 %    Socioeconomic Pre 20.36 %    Psych/Spiritual Pre 16.36 %    Family Pre 21.5 %    GLOBAL Pre 19.25 %            Scores of 19 and below usually indicate a poorer quality of life in these areas.  A difference of  2-3  points is a clinically meaningful difference.  A difference of 2-3 points in the total score of the Quality of Life Index has been associated with significant improvement in overall quality of life, self-image, physical symptoms, and general health in studies assessing change in quality of life.  PHQ-9: Review Flowsheet  More data exists      02/04/2024 07/05/2023 05/27/2023 03/22/2023 03/12/2022  Depression screen PHQ 2/9  Decreased Interest 0 0 0 0 0  Down, Depressed, Hopeless 0 0 0 0 0  PHQ - 2 Score 0 0 0 0 0  Altered sleeping 0 0 0 1 -  Tired, decreased energy 1 0 0 0 -  Change in appetite 0 0 0 0 -  Feeling bad or failure about yourself  0 0 0 0 -  Trouble concentrating 0 0 0 0 -  Moving slowly or fidgety/restless 0 0 0 0 -  Suicidal thoughts 0 0 0 0 -  PHQ-9 Score 1 0 0 1 -  Difficult doing work/chores Somewhat difficult Not difficult at all Not difficult at all Not difficult at all -   Interpretation of Total Score  Total Score Depression Severity:  1-4 = Minimal depression, 5-9 = Mild depression, 10-14 = Moderate depression, 15-19 = Moderately severe depression, 20-27 = Severe depression   Psychosocial Evaluation and Intervention:   Psychosocial Re-Evaluation:  Psychosocial Re-Evaluation     Row Name 02/13/24 0747 03/13/24 1535           Psychosocial Re-Evaluation   Current issues with Current Stress Concerns Current Stress Concerns      Comments [name] did not voice any increased concerns or stressors. Will review quality of life in the upcoming week Quality of life  and PHQ-9 reviewed.[name] denies being depressed. [name] says that his shortness of breath and energy level has improved since he has been participating in cardiac rehab.      Expected Outcomes [name] will have decreased or controlled stressors upon completion of cardiac rehab [name] will have decreased or controlled stressors upon completion of cardiac rehab      Interventions Stress management education;Encouraged to  attend Cardiac Rehabilitation for the exercise;Relaxation education Stress management education;Encouraged to attend Cardiac Rehabilitation for the exercise;Relaxation education      Continue Psychosocial Services  Follow up required by staff Follow up required by staff        Initial Review   Source of Stress Concerns Chronic Illness Chronic Illness      Comments Will continue to montior and offer support as needed Will continue to montior and offer support as needed               Psychosocial Discharge (Final Psychosocial Re-Evaluation):  Psychosocial Re-Evaluation - 03/13/24 1535       Psychosocial Re-Evaluation   Current issues with Current Stress Concerns    Comments Quality of life  and PHQ-9 reviewed.[name] denies being depressed. [name] says that his shortness of breath and energy level has improved since he has been participating in cardiac rehab.    Expected Outcomes [name] will have decreased or controlled stressors upon  completion of cardiac rehab    Interventions Stress management education;Encouraged to attend Cardiac Rehabilitation for the exercise;Relaxation education    Continue Psychosocial Services  Follow up required by staff      Initial Review   Source of Stress Concerns Chronic Illness    Comments Will continue to montior and offer support as needed             Vocational Rehabilitation: Provide vocational rehab assistance to qualifying candidates.   Vocational Rehab Evaluation & Intervention:  Vocational Rehab - 02/04/24 1106       Initial Vocational Rehab Evaluation & Intervention   Assessment shows need for Vocational Rehabilitation No   Pt is retired            Education: Education Goals: Education classes will be provided on a weekly basis, covering required topics. Participant will state understanding/return demonstration of topics presented.    Education     Row Name 02/10/24 0900     Education   Cardiac Education Topics Pritikin    Select Workshops     Workshops   Educator Exercise Physiologist   Select Psychosocial   Psychosocial Workshop Healthy Sleep for a Healthy Heart   Instruction Review Code 1- Verbalizes Understanding   Class Start Time [phone]   Class Stop Time 0858   Class Time Calculation (min) 46 min    Row Name 02/12/24 0900     Education   Cardiac Education Topics Pritikin   Secondary school teacher School   Educator Dietitian   Weekly Topic Simple Sides and Sauces   Instruction Review Code 1- Verbalizes Understanding   Class Start Time 0815   Class Stop Time 0855   Class Time Calculation (min) 40 min    Row Name 02/14/24 0800     Education   Cardiac Education Topics Pritikin   Select Core Videos     Core Videos   Educator Exercise Physiologist   Select Psychosocial   Psychosocial How Our Thoughts Can Heal Our Hearts   Instruction Review Code 1- Verbalizes Understanding   Class Start Time [phone]   Class Stop Time 0848   Class Time Calculation (min) 36 min    Row Name 02/17/24 0800     Education   Cardiac Education Topics Pritikin   Select Workshops     Workshops   Educator Exercise Physiologist   Select Exercise   Exercise Workshop Managing Heart Disease: Your Path to a Healthier Heart   Instruction Review Code 1- Verbalizes Understanding   Class Start Time [phone]   Class Stop Time 0900   Class Time Calculation (min) 48 min    Row Name 02/19/24 0900     Education   Cardiac Education Topics Pritikin   Secondary school teacher School   Educator Dietitian   Weekly Topic Powerhouse Plant-Based Proteins   Instruction Review Code 1- Verbalizes Understanding   Class Start Time 0815   Class Stop Time 0855   Class Time Calculation (min) 40 min    Row Name 02/21/24 1300     Education   Cardiac Education Topics Pritikin   Psychologist, forensic General Education   General Education Hypertension and  Heart Disease   Instruction Review Code 1- Verbalizes Understanding   Class Start Time [phone]   Class Stop Time 0847   Class Time Calculation (min) 35 min  Row Name 02/24/24 0700     Education   Cardiac Education Topics Pritikin   Select Workshops     Workshops   Educator Exercise Physiologist   Select Psychosocial   Psychosocial Workshop From Head to Heart: The Power of a Healthy Outlook   Instruction Review Code 1- Verbalizes Understanding   Class Start Time 0815   Class Stop Time 0900   Class Time Calculation (min) 45 min    Row Name 02/26/24 0800     Education   Cardiac Education Topics Pritikin   Secondary school teacher School   Educator Dietitian   Weekly Topic Tasty Appetizers and Snacks   Instruction Review Code 1- Verbalizes Understanding   Class Start Time 0815   Class Stop Time 0855   Class Time Calculation (min) 40 min    Row Name 02/28/24 0800     Education   Cardiac Education Topics Pritikin   Psychologist, forensic General Education   General Education Heart Disease Risk Reduction   Instruction Review Code 1- Verbalizes Understanding   Class Start Time 0813   Class Stop Time 0850   Class Time Calculation (min) 37 min    Row Name 03/02/24 0900     Education   Cardiac Education Topics Pritikin   Select Core Videos     Core Videos   Educator Exercise Physiologist   Select Psychosocial   Psychosocial Healthy Minds, Bodies, Hearts   Instruction Review Code 1- Verbalizes Understanding   Class Start Time 0815   Class Stop Time 0850   Class Time Calculation (min) 35 min    Row Name 03/04/24 0900     Education   Cardiac Education Topics Pritikin   Secondary school teacher School   Educator Dietitian   Weekly Topic Adding Flavor - Sodium-Free   Instruction Review Code 1- Verbalizes Understanding   Class Start Time 0815   Class Stop Time 0855   Class Time  Calculation (min) 40 min    Row Name 03/06/24 0800     Education   Cardiac Education Topics Pritikin   Select Workshops     Workshops   Educator Exercise Physiologist   Select Exercise   Exercise Workshop Location manager and Fall Prevention   Instruction Review Code 1- Verbalizes Understanding   Class Start Time 0810   Class Stop Time 0850   Class Time Calculation (min) 40 min    Row Name 03/09/24 0900     Education   Cardiac Education Topics Pritikin   Glass blower/designer Nutrition   Nutrition Workshop Label Reading   Instruction Review Code 1- Verbalizes Understanding   Class Start Time 0815   Class Stop Time 0900   Class Time Calculation (min) 45 min    Row Name 03/11/24 0800     Education   Cardiac Education Topics Pritikin   Select Core Videos     Core Videos   Educator Exercise Physiologist   Select Nutrition   Nutrition Other  Label reading   Instruction Review Code 1- Verbalizes Understanding   Class Start Time 0813   Class Stop Time 0844   Class Time Calculation (min) 31 min    Row Name 03/13/24 0700     Education   Cardiac Education Topics Pritikin   [employer]  Retail banker   Instruction Review Code 1- Verbalizes Understanding   Class Start Time 0815   Class Stop Time 0855   Class Time Calculation (min) 40 min    Row Name 03/16/24 0900     Education   Cardiac Education Topics Pritikin   Training and development officer General Education   General Education Metabolic Syndrome and Belly Fat   Instruction Review Code 1- Verbalizes Understanding   Class Start Time 0815   Class Stop Time 0851   Class Time Calculation (min) 36 min            Core Videos: Exercise    Move It!  Clinical staff conducted group or individual video education with verbal and written material and guidebook.  Patient  learns the recommended Pritikin exercise program. Exercise with the goal of living a long, healthy life. Some of the health benefits of exercise include controlled diabetes, healthier blood pressure levels, improved cholesterol levels, improved heart and lung capacity, improved sleep, and better body composition. Everyone should speak with their doctor before starting or changing an exercise routine.  Biomechanical Limitations Clinical staff conducted group or individual video education with verbal and written material and guidebook.  Patient learns how biomechanical limitations can impact exercise and how we can mitigate and possibly overcome limitations to have an impactful and balanced exercise routine.  Body Composition Clinical staff conducted group or individual video education with verbal and written material and guidebook.  Patient learns that body composition (ratio of muscle mass to fat mass) is a key component to assessing overall fitness, rather than body weight alone. Increased fat mass, especially visceral belly fat, can put us  at increased risk for metabolic syndrome, type 2 diabetes, heart disease, and even death. It is recommended to combine diet and exercise (cardiovascular and resistance training) to improve your body composition. Seek guidance from your physician and exercise physiologist before implementing an exercise routine.  Exercise Action Plan Clinical staff conducted group or individual video education with verbal and written material and guidebook.  Patient learns the recommended strategies to achieve and enjoy long-term exercise adherence, including variety, self-motivation, self-efficacy, and positive decision making. Benefits of exercise include fitness, good health, weight management, more energy, better sleep, less stress, and overall well-being.  Medical   Heart Disease Risk Reduction Clinical staff conducted group or individual video education with verbal and  written material and guidebook.  Patient learns our heart is our most vital organ as it circulates oxygen, nutrients, white blood cells, and hormones throughout the entire body, and carries waste away. Data supports a plant-based eating plan like the Pritikin Program for its effectiveness in slowing progression of and reversing heart disease. The video provides a number of recommendations to address heart disease.   Metabolic Syndrome and Belly Fat  Clinical staff conducted group or individual video education with verbal and written material and guidebook.  Patient learns what metabolic syndrome is, how it leads to heart disease, and how one can reverse it and keep it from coming back. You have metabolic syndrome if you have 3 of the following 5 criteria: abdominal obesity, high blood pressure, high triglycerides, low HDL cholesterol, and high blood sugar.  Hypertension and Heart Disease Clinical staff conducted group or individual video education with verbal and written material and guidebook.  Patient learns that high blood pressure, or hypertension, is very common in  the [place] . Hypertension is largely due to excessive salt intake, but other important risk factors include being overweight, physical inactivity, drinking too much alcohol, smoking, and not eating enough potassium from fruits and vegetables. High blood pressure is a leading risk factor for heart attack, stroke, congestive heart failure, dementia, kidney failure, and premature death. Long-term effects of excessive salt intake include stiffening of the arteries and thickening of heart muscle and organ damage. Recommendations include ways to reduce hypertension and the risk of heart disease.  Diseases of Our Time - Focusing on Diabetes Clinical staff conducted group or individual video education with verbal and written material and guidebook.  Patient learns why the best way to stop diseases of our time is prevention, through food  and other lifestyle changes. Medicine (such as prescription pills and surgeries) is often only a Band-Aid on the problem, not a long-term solution. Most common diseases of our time include obesity, type 2 diabetes, hypertension, heart disease, and cancer. The Pritikin Program is recommended and has been proven to help reduce, reverse, and/or prevent the damaging effects of metabolic syndrome.  Nutrition   Overview of the Pritikin Eating Plan  Clinical staff conducted group or individual video education with verbal and written material and guidebook.  Patient learns about the Pritikin Eating Plan for disease risk reduction. The Pritikin Eating Plan emphasizes a wide variety of unrefined, minimally-processed carbohydrates, like fruits, vegetables, whole grains, and legumes. Go, Caution, and Stop food choices are explained. Plant-based and lean animal proteins are emphasized. Rationale provided for low sodium intake for blood pressure control, low added sugars for blood sugar stabilization, and low added fats and oils for coronary artery disease risk reduction and weight management.  Calorie Density  Clinical staff conducted group or individual video education with verbal and written material and guidebook.  Patient learns about calorie density and how it impacts the Pritikin Eating Plan. Knowing the characteristics of the food you choose will help you decide whether those foods will lead to weight gain or weight loss, and whether you want to consume more or less of them. Weight loss is usually a side effect of the Pritikin Eating Plan because of its focus on low calorie-dense foods.  Label Reading  Clinical staff conducted group or individual video education with verbal and written material and guidebook.  Patient learns about the Pritikin recommended label reading guidelines and corresponding recommendations regarding calorie density, added sugars, sodium content, and whole grains.  Dining Out - Part 1   Clinical staff conducted group or individual video education with verbal and written material and guidebook.  Patient learns that restaurant meals can be sabotaging because they can be so high in calories, fat, sodium, and/or sugar. Patient learns recommended strategies on how to positively address this and avoid unhealthy pitfalls.  Facts on Fats  Clinical staff conducted group or individual video education with verbal and written material and guidebook.  Patient learns that lifestyle modifications can be just as effective, if not more so, as many medications for lowering your risk of heart disease. A Pritikin lifestyle can help to reduce your risk of inflammation and atherosclerosis (cholesterol build-up, or plaque, in the artery walls). Lifestyle interventions such as dietary choices and physical activity address the cause of atherosclerosis. A review of the types of fats and their impact on blood cholesterol levels, along with dietary recommendations to reduce fat intake is also included.  Nutrition Action Plan  Clinical staff conducted group or individual video education with verbal  and written material and guidebook.  Patient learns how to incorporate Pritikin recommendations into their lifestyle. Recommendations include planning and keeping personal health goals in mind as an important part of their success.  Healthy Mind-Set    Healthy Minds, Bodies, Hearts  Clinical staff conducted group or individual video education with verbal and written material and guidebook.  Patient learns how to identify when they are stressed. Video will discuss the impact of that stress, as well as the many benefits of stress management. Patient will also be introduced to stress management techniques. The way we think, act, and feel has an impact on our hearts.  How Our Thoughts Can Heal Our Hearts  Clinical staff conducted group or individual video education with verbal and written material and guidebook.   Patient learns that negative thoughts can cause depression and anxiety. This can result in negative lifestyle behavior and serious health problems. Cognitive behavioral therapy is an effective method to help control our thoughts in order to change and improve our emotional outlook.  Additional Videos:  Exercise    Improving Performance  Clinical staff conducted group or individual video education with verbal and written material and guidebook.  Patient learns to use a non-linear approach by alternating intensity levels and lengths of time spent exercising to help burn more calories and lose more body fat. Cardiovascular exercise helps improve heart health, metabolism, hormonal balance, blood sugar control, and recovery from fatigue. Resistance training improves strength, endurance, balance, coordination, reaction time, metabolism, and muscle mass. Flexibility exercise improves circulation, posture, and balance. Seek guidance from your physician and exercise physiologist before implementing an exercise routine and learn your capabilities and proper form for all exercise.  Introduction to Yoga  Clinical staff conducted group or individual video education with verbal and written material and guidebook.  Patient learns about yoga, a discipline of the coming together of mind, breath, and body. The benefits of yoga include improved flexibility, improved range of motion, better posture and core strength, increased lung function, weight loss, and positive self-image. Yoga's heart health benefits include lowered blood pressure, healthier heart rate, decreased cholesterol and triglyceride levels, improved immune function, and reduced stress. Seek guidance from your physician and exercise physiologist before implementing an exercise routine and learn your capabilities and proper form for all exercise.  Medical   Aging: Enhancing Your Quality of Life  Clinical staff conducted group or individual video education  with verbal and written material and guidebook.  Patient learns key strategies and recommendations to stay in good physical health and enhance quality of life, such as prevention strategies, having an advocate, securing a Health Care Proxy and Power of Attorney, and keeping a list of medications and system for tracking them. It also discusses how to avoid risk for bone loss.  Biology of Weight Control  Clinical staff conducted group or individual video education with verbal and written material and guidebook.  Patient learns that weight gain occurs because we consume more calories than we burn (eating more, moving less). Even if your body weight is normal, you may have higher ratios of fat compared to muscle mass. Too much body fat puts you at increased risk for cardiovascular disease, heart attack, stroke, type 2 diabetes, and obesity-related cancers. In addition to exercise, following the Pritikin Eating Plan can help reduce your risk.  Decoding Lab Results  Clinical staff conducted group or individual video education with verbal and written material and guidebook.  Patient learns that lab test reflects one measurement whose values  change over time and are influenced by many factors, including medication, stress, sleep, exercise, food, hydration, pre-existing medical conditions, and more. It is recommended to use the knowledge from this video to become more involved with your lab results and evaluate your numbers to speak with your doctor.   Diseases of Our Time - Overview  Clinical staff conducted group or individual video education with verbal and written material and guidebook.  Patient learns that according to the CDC, 50% to 70% of chronic diseases (such as obesity, type 2 diabetes, elevated lipids, hypertension, and heart disease) are avoidable through lifestyle improvements including healthier food choices, listening to satiety cues, and increased physical activity.  Sleep  Disorders Clinical staff conducted group or individual video education with verbal and written material and guidebook.  Patient learns how good quality and duration of sleep are important to overall health and well-being. Patient also learns about sleep disorders and how they impact health along with recommendations to address them, including discussing with a physician.  Nutrition  Dining Out - Part 2 Clinical staff conducted group or individual video education with verbal and written material and guidebook.  Patient learns how to plan ahead and communicate in order to maximize their dining experience in a healthy and nutritious manner. Included are recommended food choices based on the type of restaurant the patient is visiting.   Fueling a Banker conducted group or individual video education with verbal and written material and guidebook.  There is a strong connection between our food choices and our health. Diseases like obesity and type 2 diabetes are very prevalent and are in large-part due to lifestyle choices. The Pritikin Eating Plan provides plenty of food and hunger-curbing satisfaction. It is easy to follow, affordable, and helps reduce health risks.  Menu Workshop  Clinical staff conducted group or individual video education with verbal and written material and guidebook.  Patient learns that restaurant meals can sabotage health goals because they are often packed with calories, fat, sodium, and sugar. Recommendations include strategies to plan ahead and to communicate with the manager, chef, or server to help order a healthier meal.  Planning Your Eating Strategy  Clinical staff conducted group or individual video education with verbal and written material and guidebook.  Patient learns about the Pritikin Eating Plan and its benefit of reducing the risk of disease. The Pritikin Eating Plan does not focus on calories. Instead, it emphasizes high-quality,  nutrient-rich foods. By knowing the characteristics of the foods, we choose, we can determine their calorie density and make informed decisions.  Targeting Your Nutrition Priorities  Clinical staff conducted group or individual video education with verbal and written material and guidebook.  Patient learns that lifestyle habits have a tremendous impact on disease risk and progression. This video provides eating and physical activity recommendations based on your personal health goals, such as reducing LDL cholesterol, losing weight, preventing or controlling type 2 diabetes, and reducing high blood pressure.  Vitamins and Minerals  Clinical staff conducted group or individual video education with verbal and written material and guidebook.  Patient learns different ways to obtain key vitamins and minerals, including through a recommended healthy diet. It is important to discuss all supplements you take with your doctor.   Healthy Mind-Set    Smoking Cessation  Clinical staff conducted group or individual video education with verbal and written material and guidebook.  Patient learns that cigarette smoking and tobacco addiction pose a serious health risk which affects  millions of people. Stopping smoking will significantly reduce the risk of heart disease, lung disease, and many forms of cancer. Recommended strategies for quitting are covered, including working with your doctor to develop a successful plan.  Culinary   Becoming a Set designer conducted group or individual video education with verbal and written material and guidebook.  Patient learns that cooking at home can be healthy, cost-effective, quick, and puts them in control. Keys to cooking healthy recipes will include looking at your recipe, assessing your equipment needs, planning ahead, making it simple, choosing cost-effective seasonal ingredients, and limiting the use of added fats, salts, and sugars.  Cooking -  Breakfast and Snacks  Clinical staff conducted group or individual video education with verbal and written material and guidebook.  Patient learns how important breakfast is to satiety and nutrition through the entire day. Recommendations include key foods to eat during breakfast to help stabilize blood sugar levels and to prevent overeating at meals later in the day. Planning ahead is also a key component.  Cooking - Educational psychologist conducted group or individual video education with verbal and written material and guidebook.  Patient learns eating strategies to improve overall health, including an approach to cook more at home. Recommendations include thinking of animal protein as a side on your plate rather than center stage and focusing instead on lower calorie dense options like vegetables, fruits, whole grains, and plant-based proteins, such as beans. Making sauces in large quantities to freeze for later and leaving the skin on your vegetables are also recommended to maximize your experience.  Cooking - Healthy Salads and Dressing Clinical staff conducted group or individual video education with verbal and written material and guidebook.  Patient learns that vegetables, fruits, whole grains, and legumes are the foundations of the Pritikin Eating Plan. Recommendations include how to incorporate each of these in flavorful and healthy salads, and how to create homemade salad dressings. Proper handling of ingredients is also covered. Cooking - Soups and [employer] - Soups and Desserts Clinical staff conducted group or individual video education with verbal and written material and guidebook.  Patient learns that Pritikin soups and desserts make for easy, nutritious, and delicious snacks and meal components that are low in sodium, fat, sugar, and calorie density, while high in vitamins, minerals, and filling fiber. Recommendations include simple and healthy ideas for soups and  desserts.   Overview     The Pritikin Solution Program Overview Clinical staff conducted group or individual video education with verbal and written material and guidebook.  Patient learns that the results of the Pritikin Program have been documented in more than 100 articles published in peer-reviewed journals, and the benefits include reducing risk factors for (and, in some cases, even reversing) high cholesterol, high blood pressure, type 2 diabetes, obesity, and more! An overview of the three key pillars of the Pritikin Program will be covered: eating well, doing regular exercise, and having a healthy mind-set.  WORKSHOPS  Exercise: Exercise Basics: Building Your Action Plan Clinical staff led group instruction and group discussion with PowerPoint presentation and patient guidebook. To enhance the learning environment the use of posters, models and videos may be added. At the conclusion of this workshop, patients will comprehend the difference between physical activity and exercise, as well as the benefits of incorporating both, into their routine. Patients will understand the FITT (Frequency, Intensity, Time, and Type) principle and how to use it to build an  exercise action plan. In addition, safety concerns and other considerations for exercise and cardiac rehab will be addressed by the presenter. The purpose of this lesson is to promote a comprehensive and effective weekly exercise routine in order to improve patients' overall level of fitness.   Managing Heart Disease: Your Path to a Healthier Heart Clinical staff led group instruction and group discussion with PowerPoint presentation and patient guidebook. To enhance the learning environment the use of posters, models and videos may be added.At the conclusion of this workshop, patients will understand the anatomy and physiology of the heart. Additionally, they will understand how Pritikin's three pillars impact the risk factors, the  progression, and the management of heart disease.  The purpose of this lesson is to provide a high-level overview of the heart, heart disease, and how the Pritikin lifestyle positively impacts risk factors.  Exercise Biomechanics Clinical staff led group instruction and group discussion with PowerPoint presentation and patient guidebook. To enhance the learning environment the use of posters, models and videos may be added. Patients will learn how the structural parts of their bodies function and how these functions impact their daily activities, movement, and exercise. Patients will learn how to promote a neutral spine, learn how to manage pain, and identify ways to improve their physical movement in order to promote healthy living. The purpose of this lesson is to expose patients to common physical limitations that impact physical activity. Participants will learn practical ways to adapt and manage aches and pains, and to minimize their effect on regular exercise. Patients will learn how to maintain good posture while sitting, walking, and lifting.  Balance Training and Fall Prevention  Clinical staff led group instruction and group discussion with PowerPoint presentation and patient guidebook. To enhance the learning environment the use of posters, models and videos may be added. At the conclusion of this workshop, patients will understand the importance of their sensorimotor skills (vision, proprioception, and the vestibular system) in maintaining their ability to balance as they age. Patients will apply a variety of balancing exercises that are appropriate for their current level of function. Patients will understand the common causes for poor balance, possible solutions to these problems, and ways to modify their physical environment in order to minimize their fall risk. The purpose of this lesson is to teach patients about the importance of maintaining balance as they age and ways to  minimize their risk of falling.  WORKSHOPS   Nutrition:  Fueling a Ship broker led group instruction and group discussion with PowerPoint presentation and patient guidebook. To enhance the learning environment the use of posters, models and videos may be added. Patients will review the foundational principles of the Pritikin Eating Plan and understand what constitutes a serving size in each of the food groups. Patients will also learn Pritikin-friendly foods that are better choices when away from home and review make-ahead meal and snack options. Calorie density will be reviewed and applied to three nutrition priorities: weight maintenance, weight loss, and weight gain. The purpose of this lesson is to reinforce (in a group setting) the key concepts around what patients are recommended to eat and how to apply these guidelines when away from home by planning and selecting Pritikin-friendly options. Patients will understand how calorie density may be adjusted for different weight management goals.  Mindful Eating  Clinical staff led group instruction and group discussion with PowerPoint presentation and patient guidebook. To enhance the learning environment the use of posters, models and  videos may be added. Patients will briefly review the concepts of the Pritikin Eating Plan and the importance of low-calorie dense foods. The concept of mindful eating will be introduced as well as the importance of paying attention to internal hunger signals. Triggers for non-hunger eating and techniques for dealing with triggers will be explored. The purpose of this lesson is to provide patients with the opportunity to review the basic principles of the Pritikin Eating Plan, discuss the value of eating mindfully and how to measure internal cues of hunger and fullness using the Hunger Scale. Patients will also discuss reasons for non-hunger eating and learn strategies to use for controlling emotional  eating.  Targeting Your Nutrition Priorities Clinical staff led group instruction and group discussion with PowerPoint presentation and patient guidebook. To enhance the learning environment the use of posters, models and videos may be added. Patients will learn how to determine their genetic susceptibility to disease by reviewing their family history. Patients will gain insight into the importance of diet as part of an overall healthy lifestyle in mitigating the impact of genetics and other environmental insults. The purpose of this lesson is to provide patients with the opportunity to assess their personal nutrition priorities by looking at their family history, their own health history and current risk factors. Patients will also be able to discuss ways of prioritizing and modifying the Pritikin Eating Plan for their highest risk areas  Menu  Clinical staff led group instruction and group discussion with PowerPoint presentation and patient guidebook. To enhance the learning environment the use of posters, models and videos may be added. Using menus brought in from [employer], or printed from [employer], patients will apply the Pritikin dining out guidelines that were presented in the [employer] video. Patients will also be able to practice these guidelines in a variety of provided scenarios. The purpose of this lesson is to provide patients with the opportunity to practice hands-on learning of the Pritikin Dining Out guidelines with actual menus and practice scenarios.  Label Reading Clinical staff led group instruction and group discussion with PowerPoint presentation and patient guidebook. To enhance the learning environment the use of posters, models and videos may be added. Patients will review and discuss the Pritikin label reading guidelines presented in Pritikin's Label Reading Educational series video. Using fool labels brought in from local grocery stores and  markets, patients will apply the label reading guidelines and determine if the packaged food meet the Pritikin guidelines. The purpose of this lesson is to provide patients with the opportunity to review, discuss, and practice hands-on learning of the Pritikin Label Reading guidelines with actual packaged food labels. Cooking School  Pritikin's [employer] are designed to teach patients ways to prepare quick, simple, and affordable recipes at home. The importance of nutrition's role in chronic disease risk reduction is reflected in its emphasis in the overall Pritikin program. By learning how to prepare essential core Pritikin Eating Plan recipes, patients will increase control over what they eat; be able to customize the flavor of foods without the use of added salt, sugar, or fat; and improve the quality of the food they consume. By learning a set of core recipes which are easily assembled, quickly prepared, and affordable, patients are more likely to prepare more healthy foods at home. These workshops focus on convenient breakfasts, simple entres, side dishes, and desserts which can be prepared with minimal effort and are consistent with nutrition recommendations for cardiovascular risk reduction.  Cooking [employer] are taught by a Armed forces logistics/support/administrative officer (RD) who has been trained by the [employer]. The chef or RD has a clear understanding of the importance of minimizing - if not completely eliminating - added fat, sugar, and sodium in recipes. Throughout the series of Cooking School Workshop sessions, patients will learn about healthy ingredients and efficient methods of cooking to build confidence in their capability to prepare    Cooking School weekly topics:  Adding Flavor- Sodium-Free  Fast and Healthy Breakfasts  Powerhouse Plant-Based Proteins  Satisfying Salads and Dressings  Simple Sides and Sauces  International Cuisine-Spotlight on the [employer]  Zones  Delicious Desserts  Savory Soups  [employer] - Meals in a Astronomer Appetizers and Snacks  Comforting Weekend Breakfasts  One-Pot Wonders   Fast Evening Meals  Landscape architect Your Pritikin Plate  WORKSHOPS   Healthy Mindset (Psychosocial):  Focused Goals, Sustainable Changes Clinical staff led group instruction and group discussion with PowerPoint presentation and patient guidebook. To enhance the learning environment the use of posters, models and videos may be added. Patients will be able to apply effective goal setting strategies to establish at least one personal goal, and then take consistent, meaningful action toward that goal. They will learn to identify common barriers to achieving personal goals and develop strategies to overcome them. Patients will also gain an understanding of how our mind-set can impact our ability to achieve goals and the importance of cultivating a positive and growth-oriented mind-set. The purpose of this lesson is to provide patients with a deeper understanding of how to set and achieve personal goals, as well as the tools and strategies needed to overcome common obstacles which may arise along the way.  From Head to Heart: The Power of a Healthy Outlook  Clinical staff led group instruction and group discussion with PowerPoint presentation and patient guidebook. To enhance the learning environment the use of posters, models and videos may be added. Patients will be able to recognize and describe the impact of emotions and mood on physical health. They will discover the importance of self-care and explore self-care practices which may work for them. Patients will also learn how to utilize the 4 C's to cultivate a healthier outlook and better manage stress and challenges. The purpose of this lesson is to demonstrate to patients how a healthy outlook is an essential part of maintaining good health, especially as they continue their  cardiac rehab journey.  Healthy Sleep for a Healthy Heart Clinical staff led group instruction and group discussion with PowerPoint presentation and patient guidebook. To enhance the learning environment the use of posters, models and videos may be added. At the conclusion of this workshop, patients will be able to demonstrate knowledge of the importance of sleep to overall health, well-being, and quality of life. They will understand the symptoms of, and treatments for, common sleep disorders. Patients will also be able to identify daytime and nighttime behaviors which impact sleep, and they will be able to apply these tools to help manage sleep-related challenges. The purpose of this lesson is to provide patients with a general overview of sleep and outline the importance of quality sleep. Patients will learn about a few of the most common sleep disorders. Patients will also be introduced to the concept of "sleep hygiene," and discover ways to self-manage certain sleeping problems through simple daily behavior changes. Finally, the workshop will motivate patients by clarifying the links between quality  sleep and their goals of heart-healthy living.   Recognizing and Reducing Stress Clinical staff led group instruction and group discussion with PowerPoint presentation and patient guidebook. To enhance the learning environment the use of posters, models and videos may be added. At the conclusion of this workshop, patients will be able to understand the types of stress reactions, differentiate between acute and chronic stress, and recognize the impact that chronic stress has on their health. They will also be able to apply different coping mechanisms, such as reframing negative self-talk. Patients will have the opportunity to practice a variety of stress management techniques, such as deep abdominal breathing, progressive muscle relaxation, and/or guided imagery.  The purpose of this lesson is to educate  patients on the role of stress in their lives and to provide healthy techniques for coping with it.  Learning Barriers/Preferences:  Learning Barriers/Preferences - 02/04/24 1321       Learning Barriers/Preferences   Learning Barriers Sight    Learning Preferences Video;Pictoral;Computer/Internet             Education Topics:  Knowledge Questionnaire Score:  Knowledge Questionnaire Score - 02/04/24 1322       Knowledge Questionnaire Score   Pre Score 22/24             Core Components/Risk Factors/Patient Goals at Admission:  Personal Goals and Risk Factors at Admission - 02/04/24 1106       Core Components/Risk Factors/Patient Goals on Admission    Weight Management Yes;Weight Loss;Obesity    Intervention Weight Management: Develop a combined nutrition and exercise program designed to reach desired caloric intake, while maintaining appropriate intake of nutrient and fiber, sodium and fats, and appropriate energy expenditure required for the weight goal.;Weight Management: Provide education and appropriate resources to help participant work on and attain dietary goals.;Weight Management/Obesity: Establish reasonable short term and long term weight goals.    Admit Weight 212 lb 11.9 oz (96.5 kg)    Expected Outcomes Short Term: Continue to assess and modify interventions until short term weight is achieved;Long Term: Adherence to nutrition and physical activity/exercise program aimed toward attainment of established weight goal;Weight Loss: Understanding of general recommendations for a balanced deficit meal plan, which promotes 1-2 lb weight loss per week and includes a negative energy balance of [phone] kcal/d;Understanding recommendations for meals to include 15-35% energy as protein, 25-35% energy from fat, 35-60% energy from carbohydrates, less than 200mg  of dietary cholesterol, 20-35 gm of total fiber daily;Understanding of distribution of calorie intake throughout the day  with the consumption of 4-5 meals/snacks    Hypertension Yes    Intervention Provide education on lifestyle modifcations including regular physical activity/exercise, weight management, moderate sodium restriction and increased consumption of fresh fruit, vegetables, and low fat dairy, alcohol moderation, and smoking cessation.;Monitor prescription use compliance.    Expected Outcomes Short Term: Continued assessment and intervention until BP is < 140/67mm HG in hypertensive participants. < 130/25mm HG in hypertensive participants with diabetes, heart failure or chronic kidney disease.;Long Term: Maintenance of blood pressure at goal levels.    Lipids Yes    Intervention Provide education and support for participant on nutrition & aerobic/resistive exercise along with prescribed medications to achieve LDL 70mg , HDL >40mg .    Expected Outcomes Short Term: Participant states understanding of desired cholesterol values and is compliant with medications prescribed. Participant is following exercise prescription and nutrition guidelines.;Long Term: Cholesterol controlled with medications as prescribed, with individualized exercise RX and with personalized nutrition plan. Value goals: LDL <  70mg , HDL > 40 mg.             Core Components/Risk Factors/Patient Goals Review:   Goals and Risk Factor Review     Row Name 02/13/24 0755 03/13/24 1537           Core Components/Risk Factors/Patient Goals Review   Personal Goals Review Weight Management/Obesity;Hypertension;Lipids Weight Management/Obesity;Hypertension;Lipids      Review [name] started cardiac rehab on 02/10/24. [name] is off to a good start to exercise. Some moderate exertional systolic  BP elevations noted will continue to monitor [name] is doing well with exercise at  cardiac rehab. [name] has increased his met levels. [name]'s blood pressures have improved since Slater Duncan NP added amlodipine . Recent systolic BP's have improved.       Expected Outcomes [name] will continue to participate in cardiac rehab for exercise, nutrition and lifestyle modifications [name] will continue to participate in cardiac rehab for exercise, nutrition and lifestyle modifications               Core Components/Risk Factors/Patient Goals at Discharge (Final Review):   Goals and Risk Factor Review - 03/13/24 1537       Core Components/Risk Factors/Patient Goals Review   Personal Goals Review Weight Management/Obesity;Hypertension;Lipids    Review [name] is doing well with exercise at  cardiac rehab. [name] has increased his met levels. [name]'s blood pressures have improved since Slater Duncan NP added amlodipine . Recent systolic BP's have improved.    Expected Outcomes [name] will continue to participate in cardiac rehab for exercise, nutrition and lifestyle modifications             ITP Comments:  ITP Comments     Row Name 02/04/24 1112 02/13/24 0743 03/13/24 1527       ITP Comments Luis Dustman turner, MD: Medical Director.  Intorduction to the [employer] / Intensive Cardiac Rehab.  Initial orientation packet reviewed with the patient. 30 Day ITP Review. [name] started cardiac rehab on 02/10/24. [name] is off to a good start  with exercise. 30 Day ITP Review. [name] has god attendance and participation wiht exercise at  cardiac rehab              Comments: See ITP comments.Monte Antonio RN BSN

## 2024-03-16 ENCOUNTER — Encounter (HOSPITAL_COMMUNITY)
Admission: RE | Admit: 2024-03-16 | Discharge: 2024-03-16 | Disposition: A | Discharge status: Home / Self Care | Source: Ambulatory Visit | Attending: Cardiology | Admitting: Cardiology

## 2024-03-16 DIAGNOSIS — Z951 Presence of aortocoronary bypass graft: Secondary | ICD-10-CM

## 2024-03-16 DIAGNOSIS — Z952 Presence of prosthetic heart valve: Secondary | ICD-10-CM

## 2024-03-18 ENCOUNTER — Encounter (HOSPITAL_COMMUNITY)
Admission: RE | Admit: 2024-03-18 | Discharge: 2024-03-18 | Disposition: A | Discharge status: Home / Self Care | Source: Ambulatory Visit | Attending: Cardiology | Admitting: Cardiology

## 2024-03-18 DIAGNOSIS — Z951 Presence of aortocoronary bypass graft: Secondary | ICD-10-CM | POA: Diagnosis not present

## 2024-03-18 DIAGNOSIS — Z952 Presence of prosthetic heart valve: Secondary | ICD-10-CM

## 2024-03-18 DIAGNOSIS — I77810 Thoracic aortic ectasia: Secondary | ICD-10-CM

## 2024-03-20 ENCOUNTER — Encounter (HOSPITAL_COMMUNITY)
Admission: RE | Admit: 2024-03-20 | Discharge: 2024-03-20 | Disposition: A | Discharge status: Home / Self Care | Source: Ambulatory Visit | Attending: Cardiology | Admitting: Cardiology

## 2024-03-20 DIAGNOSIS — I77810 Thoracic aortic ectasia: Secondary | ICD-10-CM

## 2024-03-20 DIAGNOSIS — Z951 Presence of aortocoronary bypass graft: Secondary | ICD-10-CM | POA: Diagnosis not present

## 2024-03-20 DIAGNOSIS — Z952 Presence of prosthetic heart valve: Secondary | ICD-10-CM

## 2024-03-23 NOTE — Progress Notes (Unsigned)
 Patient ID: [name] MRN: [id number] DOB/AGE: 31-Jul-1946 78 y.o.  Primary Care Physician:Tower, Manley Seeds, MD Primary Cardiologist: Gaylyn Keas, MD  FOCUSED CARDIOVASCULAR PROBLEM LIST:   Aortic stenosis AVR with 23 mm Inspiris and LIMA to LAD, vein graft to diagonal February 2025 Postoperative atrial fibrillation Postoperative tamponade >> pericardial window Hypertension Hyperlipidemia Aortic atherosclerosis Chest x-ray 2025 CAD  LIMA to LAD, vein graft to diagonal February 2025 CKD stage IV Bifascicular block  1AVB + LBBB   HISTORY OF PRESENT ILLNESS:  9/24:  The patient is a 78 y.o. male with the indicated medical history here for recommendations regarding ongoing dyspnea and chest discomfort.  Patient was seen by general cardiology recently.  He had reported increasing shortness of breath and chest discomfort starting over the summer.  He is referred for coronary CTA which suggested a significant stenosis in the mid to distal left anterior descending artery.  An echocardiogram demonstrated moderate aortic stenosis with a calcified aortic valve with diminished mobility.  He underwent cardiac catheterization which demonstrated moderate obstructive coronary artery disease of the proximal LAD.  His cardiac output and index were preserved with relatively normal filling pressures.  He is here with his wife today.  He reports exertional chest discomfort with more than moderate exertion.  He has had to modulate his level of activity because of it.  This is also associated with shortness of breath.  He has fortunately had no presyncope or syncope.  He denies any fatigue however.  He denies any peripheral edema, paroxysmal nocturnal dyspnea, orthopnea.  He has had some mild dependent edema at times.  He is normally quite an active individual but he has definitely noticed over the last few months that he is not able to do as much as he would like to do.  Used to be able to kayak  and because of these new limitations he has not yet tried kayaking again.  He would like to hike had a higher intensity than he is currently.  He reports very good dental health.  Plan: Refer for progress trial inclusion.  1/25: In the interim the patient was found not to be a candidate for the progress trial due to his LVOT calcification pattern.  He underwent coronary angiography which demonstrated moderate disease and relatively normal filling pressures.  He contacted our office with reports of increasing exertional dyspnea.  He had an echocardiogram which demonstrated progression to severe aortic stenosis.  In December he noticed increasing shortness of breath.  He was helping his son carry a table up to the attic and he became quite short of breath after doing this.  His wife is told me that he occasionally he gets ashen.  He has noticed increasing shortness of breath when he walks up a hill.  He denies any exertional angina.  He has had no dizziness or syncope.  He has noticed some increasing ankle edema.  He is on chronic amlodipine  but is noticed that this has been a little bit more noticeable.  It is not bothersome.  He denies any paroxysmal nocturnal dyspnea, orthopnea, signs or symptoms of stroke, or severe bleeding.  He is motivated to do something to feel better.  Plan:  Evaluate for AVR + CABG vs TAVR.  May 2025:  Patient consents to use of AI scribe. In the interim since I saw the patient last he underwent aortic valve replacement and two-vessel CABG.  His course was complicated by pericardial effusion requiring repeat surgery.  Unfortunately  his creatinine has increased and he has developed aged for chronic kidney disease.  His postoperative course was also notable for postoperative atrial fibrillation which was short-lived.         Past Medical History:  Diagnosis Date   Aortic stenosis    s/p  23 mm Inspiris valve present in the aortic position.  Normal to valve function on echo  12/2023 mean aortic valve gradient 10 mmHg   Arthritis    LEFT knee   Ascending aorta dilatation (HCC)    41 mm by 2D echo 12/2023   Back pain    Coronary artery disease    Diverticulosis    Heart murmur    History of colon polyps    Hyperlipidemia    on meds   Hypertension    on meds   Hypothyroidism    Moderate aortic stenosis    Pre-diabetes    Thyroid  disease    on meds    Past Surgical History:  Procedure Laterality Date   AORTIC VALVE REPLACEMENT N/A 12/06/2023   Procedure: AORTIC VALVE REPLACEMENT (AVR) USING INSPIRIS AORTIC VALVE SIZE 23 MM;  Surgeon: Melene Sportsman, MD;  Location: [facility] OR;  Service: Open Heart Surgery;  Laterality: N/A;   CARDIOVERSION  12/20/2023   Procedure: CARDIOVERSION;  Surgeon: Melene Sportsman, MD;  Location: [facility];  Service: Thoracic;;   COLONOSCOPY  2018   SA-MAC-suprep-ageq with lavage-tics/int hems/TA x 5 frags   CORONARY ARTERY BYPASS GRAFT N/A 12/06/2023   Procedure: CORONARY ARTERY BYPASS GRAFTING (CABG) TIMES TWO USING LEFT INTERNAL MAMMARY ARTERY AND ENDOSCOPICALLY HARVESTED RIGHT GREATER SAPHENOUS VEIN;  Surgeon: Melene Sportsman, MD;  Location: [facility] OR;  Service: Open Heart Surgery;  Laterality: N/A;   INGUINAL HERNIA REPAIR Bilateral 02/13/2017   Procedure: LAPAROSCOPIC BILATERAL INGUINAL HERNIA REPAIR WITH MESH;  Surgeon: Oza Blumenthal, MD;  Location: [facility] OR;  Service: General;  Laterality: Bilateral;   LASIK     PERICARDIAL FLUID DRAINAGE  12/20/2023   Procedure: DRAINAGE OF PERICARDIAL FLUID;  Surgeon: Melene Sportsman, MD;  Location: [facility] OR;  Service: Thoracic;;   POLYPECTOMY  2018   TA x 5   RIGHT HEART CATH AND CORONARY ANGIOGRAPHY N/A 07/03/2023   Procedure: RIGHT HEART CATH AND CORONARY ANGIOGRAPHY;  Surgeon: Kyra Phy, MD;  Location: [facility] INVASIVE CV LAB;  Service: Cardiovascular;  Laterality: N/A;   SUBXYPHOID PERICARDIAL WINDOW N/A 12/20/2023   Procedure: SUBXYPHOID PERICARDIAL WINDOW WITH CONVERSION TO STERNOTOMY. REPAIR OF RV INJURY;   Surgeon: Melene Sportsman, MD;  Location: [facility] OR;  Service: Thoracic;  Laterality: N/A;   TEE WITHOUT CARDIOVERSION N/A 12/06/2023   Procedure: TRANSESOPHAGEAL ECHOCARDIOGRAM (TEE);  Surgeon: Melene Sportsman, MD;  Location: [facility] OR;  Service: Open Heart Surgery;  Laterality: N/A;   TEE WITHOUT CARDIOVERSION N/A 12/20/2023   Procedure: TRANSESOPHAGEAL ECHOCARDIOGRAM (TEE);  Surgeon: Melene Sportsman, MD;  Location: [facility] OR;  Service: Thoracic;  Laterality: N/A;   UMBILICAL HERNIA REPAIR N/A 02/13/2017   Procedure: LAPAROSCOPIC UMBILICAL HERNIA REPAIR;  Surgeon: Oza Blumenthal, MD;  Location: [facility] OR;  Service: General;  Laterality: N/A;   WISDOM TOOTH EXTRACTION      Family History  Problem Relation Age of Onset   Cancer Mother        brain tumor   Emphysema Father    Diabetes Father    HIV Brother    Colon cancer Neg Hx    Colon polyps Neg Hx    Esophageal cancer Neg Hx    Rectal cancer Neg Hx  Stomach cancer Neg Hx     Social History   Socioeconomic History   Marital status: Married    Spouse name: Not on file   Number of children: 1   Years of education: Not on file   Highest education level: Associate degree: occupational, Scientist, product/process development, or vocational program  Occupational History    Employer: [employer]  Tobacco Use   Smoking status: Never   Smokeless tobacco: Never  Vaping Use   Vaping status: Never Used  Substance and Sexual Activity   Alcohol use: Yes    Alcohol/week: 21.0 - 28.0 standard drinks of alcohol    Types: 21 - 28 Cans of beer per week    Comment: 3-4 cans of beer daily   Drug use: No   Sexual activity: Yes  Other Topics Concern   Not on file  Social History Narrative   Not on file   Social Drivers of Health   Financial Resource Strain: Low Risk  (07/04/2023)   Overall Financial Resource Strain (CARDIA)    Difficulty of Paying Living Expenses: Not hard at all  Food Insecurity: No Food Insecurity (12/25/2023)   Hunger Vital Sign    Worried About Running Out of  Food in the Last Year: Never true    Ran Out of Food in the Last Year: Never true  Transportation Needs: No Transportation Needs (12/25/2023)   PRAPARE - Administrator, Civil Service (Medical): No    Lack of Transportation (Non-Medical): No  Physical Activity: Sufficiently Active (07/04/2023)   Exercise Vital Sign    Days of Exercise per Week: 5 days    Minutes of Exercise per Session: 60 min  Stress: Not on file  Social Connections: Moderately Isolated (12/19/2023)   Social Connection and Isolation Panel [NHANES]    Frequency of Communication with Friends and Family: Once a week    Frequency of Social Gatherings with Friends and Family: More than three times a week    Attends Religious Services: Never    Database administrator or Organizations: No    Attends Banker Meetings: Never    Marital Status: Married  Catering manager Violence: Not At Risk (12/25/2023)   Humiliation, Afraid, Rape, and Kick questionnaire    Fear of Current or Ex-Partner: No    Emotionally Abused: No    Physically Abused: No    Sexually Abused: No     Prior to Admission medications   Medication Sig Start Date End Date Taking? Authorizing Provider  amLODipine  (NORVASC ) 2.5 MG tablet Take 1 tablet (2.5 mg total) by mouth daily. 03/04/24   Swinyer, Leilani Punter, NP  aspirin  EC 325 MG tablet Take 1 tablet (325 mg total) by mouth daily. Swallow whole. 12/24/23   Gold, Wayne E, PA-C  ezetimibe  (ZETIA ) 10 MG tablet Take 1 tablet (10 mg total) by mouth daily. 12/26/23   Von Grumbling, PA-C  levothyroxine  (SYNTHROID ) 25 MCG tablet TAKE 1 TABLET BY MOUTH EVERY DAY BEFORE BREAKFAST 11/22/23   Tower, Manley Seeds, MD  metoprolol  tartrate (LOPRESSOR ) 50 MG tablet Take 1 tablet (50 mg total) by mouth 2 (two) times daily. 12/26/23   Conte, Tessa N, PA-C  Multiple Vitamin (MULTIVITAMIN WITH MINERALS) TABS tablet Take 1 tablet by mouth in the morning. Centrum Silver    [provider]  rosuvastatin  (CRESTOR )  40 MG tablet Take 1 tablet (40 mg total) by mouth daily. 12/26/23   Von Grumbling, PA-C    Allergies  Allergen  Reactions   Vytorin [Ezetimibe -Simvastatin] Other (See Comments)    back pain    REVIEW OF SYSTEMS:  General: no fevers/chills/night sweats Eyes: no blurry vision, diplopia, or amaurosis ENT: no sore throat or hearing loss Resp: no cough, wheezing, or hemoptysis CV: no edema or palpitations GI: no abdominal pain, nausea, vomiting, diarrhea, or constipation GU: no dysuria, frequency, or hematuria Skin: no rash Neuro: no headache, numbness, tingling, or weakness of extremities Musculoskeletal: no joint pain or swelling Heme: no bleeding, DVT, or easy bruising Endo: no polydipsia or polyuria  There were no vitals taken for this visit.  PHYSICAL EXAM: GEN:  AO x 3 in no acute distress HEENT: normal Dentition: Normal*** Neck: JVP normal. +2***carotid upstrokes without bruits. No thyromegaly. Lungs: equal expansion, clear bilaterally CV: Apex is discrete and nondisplaced, RRR without murmur or gallop*** Abd: soft, non-tender, non-distended; no bruit; positive bowel sounds Ext: no edema, ecchymoses, or cyanosis Vascular: 2+ femoral pulses, 2+ radial pulses       Skin: warm and dry without rash Neuro: CN II-XII grossly intact; motor and sensory grossly intact    DATA AND STUDIES:  EKG:  EKG Interpretation Date/Time:    Ventricular Rate:    PR Interval:    QRS Duration:    QT Interval:    QTC Calculation:   R Axis:      Text Interpretation:          Cardiac Studies & Procedures   ______________________________________________________________________________________________ CARDIAC CATHETERIZATION  CARDIAC CATHETERIZATION 07/03/2023  Conclusion   Dist RCA lesion is 40% stenosed.   Prox LAD lesion is 65% stenosed.   Lat 1st Diag lesion is 80% stenosed.  1.  Moderate calcified proximal LAD lesion with mild diffuse disease elsewhere. 2.  Separate ostia  of LAD and left circumflex arteries. 3.  Fick cardiac output of 11.3 L/min and Fick cardiac index of 5.4 L/min/m with the following hemodynamics: RA pressure mean of 8 mmHg RV pressure 41/3 with an end-diastolic pressure of 10 mmHg Wedge pressure mean of 12 mmHg with V waves to 18 mmHg PA pressure 42/9 with a mean of 23 mmHg Pulmonary vascular resistance of 1 Wood unit Pulmonary artery pulsatility index of 4 4.  Capacious iliofemoral vessels bilaterally. 5.  Right radial arterial loop requiring coronary guidewire to navigate.  Recommendation: Continue evaluation for management of concomitant aortic stenosis and coronary artery disease.  Findings Coronary Findings Diagnostic  Dominance: Right  Left Anterior Descending Prox LAD lesion is 65% stenosed.  Lateral First Diagonal Branch Lat 1st Diag lesion is 80% stenosed.  Ramus Intermedius There is mild diffuse disease throughout the vessel.  Left Circumflex There is mild diffuse disease throughout the vessel.  Right Coronary Artery There is mild diffuse disease throughout the vessel. Dist RCA lesion is 40% stenosed.  Intervention  No interventions have been documented.   STRESS TESTS  NM PET CT CARDIAC PERFUSION MULTI W/ABSOLUTE BLOODFLOW 11/26/2023  Narrative   Findings are consistent with no ischemia and no infarction on perfusion images. The study is intermediate risk given presence of transient ischemic dilation, and no augmentation of EF with stress. Reduced MBFR may be secondary to severe aortic valve stenosis contributing to microvascular dysfunction.   Myocardial blood flow was computed to be 1.57ml/g/min at rest and 1.28ml/g/min at stress. Global myocardial blood flow reserve was 1.10. Myocardial blood flow reserve is indeterminate in this patient due to technical or patient-specific concerns that affect accuracy: severe aortic valve stenosis.   LV perfusion is normal.  There is no evidence of ischemia. There is no  evidence of infarction.   Rest left ventricular function is normal. Rest EF: 57%. Stress left ventricular function is normal. Stress EF: 53%. End diastolic cavity size is moderately enlarged. End systolic cavity size is mildly enlarged. Evidence of transient ischemic dilation (TID) noted.   Coronary calcium  was present on the attenuation correction CT images. Severe coronary calcifications were present. Coronary calcifications were present in the left anterior descending artery, left circumflex artery and right coronary artery distribution(s).   Electronically signed by: Euell Herrlich, MD  CLINICAL DATA:  This over-read does not include interpretation of cardiac or coronary anatomy or pathology. The Cardiac PET CT interpretation by the cardiologist is attached.  COMPARISON:  CT angio chest 07/09/2023  FINDINGS: Vascular: No acute abnormality.  Mediastinum/Nodes: No mass or adenopathy identified within the imaged portions of the mediastinum.  Lungs/Pleura: No pleural effusion, airspace consolidation or pneumothorax. No suspicious pulmonary nodule or mass identified.  Upper Abdomen: No acute abnormality within the imaged portions of the upper abdomen. Hepatic steatosis.  Musculoskeletal: No acute or suspicious osseous findings.  IMPRESSION: 1. No significant noncardiac supplemental findings identified within the chest. 2. Hepatic steatosis.   Electronically Signed By: Kimberley Penman M.D. On: 11/26/2023 13:53   ECHOCARDIOGRAM  ECHOCARDIOGRAM COMPLETE 01/17/2024  Narrative ECHOCARDIOGRAM REPORT    Patient Name:   Burle PIENE Sassaman Date of Exam: 01/17/2024 Medical Rec #:  [id number]              Height:       68.0 in Accession #:    [id number]             Weight:       216.0 lb Date of Birth:  1946-05-23             BSA:          2.112 m Patient Age:    77 years               BP:           132/60 mmHg Patient Gender: M                      HR:           102  bpm. Exam Location:  [address]  Procedure: 2D Echo and Strain Analysis (Both Spectral and Color Flow Doppler were utilized during procedure).  Indications:    Z95.2 S/P aortic valve replacement  History:        Patient has prior history of Echocardiogram examinations, most recent 12/20/2023. CAD, Signs/Symptoms:Murmur; Risk Factors:Hypertension and Dyslipidemia. Aortic stenosis. Pericardial effusion. Prediabetes. Chest pressure. Hypothyroid. Aortic Valve: 23 mm Inspiris valve is present in the aortic position. Procedure Date: 12/06/23.  Sonographer:    Mylinda Asa RCS Referring Phys: [phone] TRACI R TURNER  IMPRESSIONS   1. Left ventricular ejection fraction, by estimation, is 60 to 65%. The left ventricle has normal function. The left ventricle has no regional wall motion abnormalities. Left ventricular diastolic parameters are indeterminate. The average left ventricular global longitudinal strain is -19.2 %. The global longitudinal strain is normal. 2. Right ventricular systolic function is normal. The right ventricular size is normal. 3. Left atrial size was mildly dilated. 4. The mitral valve is normal in structure. Trivial mitral valve regurgitation. No evidence of mitral stenosis. 5. The aortic valve has been repaired/replaced. Aortic valve regurgitation is not visualized. No aortic stenosis is present.  There is a 23 mm Inspiris valve present in the aortic position. Procedure Date: 12/06/23. Echo findings are consistent with normal structure and function of the aortic valve prosthesis. Aortic valve mean gradient measures 10.0 mmHg. Aortic valve Vmax measures 2.25 m/s. 6. There is mild dilatation of the ascending aorta, measuring 41 mm. 7. The inferior vena cava is normal in size with greater than 50% respiratory variability, suggesting right atrial pressure of 3 mmHg.  Comparison(s): No pericardial effusion.  FINDINGS Left Ventricle: Left ventricular ejection fraction, by  estimation, is 60 to 65%. The left ventricle has normal function. The left ventricle has no regional wall motion abnormalities. The average left ventricular global longitudinal strain is -19.2 %. Strain was performed and the global longitudinal strain is normal. The left ventricular internal cavity size was normal in size. There is no left ventricular hypertrophy. Left ventricular diastolic parameters are indeterminate.  Right Ventricle: The right ventricular size is normal. No increase in right ventricular wall thickness. Right ventricular systolic function is normal.  Left Atrium: Left atrial size was mildly dilated.  Right Atrium: Right atrial size was normal in size.  Pericardium: There is no evidence of pericardial effusion.  Mitral Valve: The mitral valve is normal in structure. Trivial mitral valve regurgitation. No evidence of mitral valve stenosis.  Tricuspid Valve: The tricuspid valve is normal in structure. Tricuspid valve regurgitation is not demonstrated. No evidence of tricuspid stenosis.  Aortic Valve: The aortic valve has been repaired/replaced. Aortic valve regurgitation is not visualized. No aortic stenosis is present. Aortic valve mean gradient measures 10.0 mmHg. Aortic valve peak gradient measures 20.2 mmHg. There is a 23 mm Inspiris valve present in the aortic position. Procedure Date: 12/06/23. Echo findings are consistent with normal structure and function of the aortic valve prosthesis.  Pulmonic Valve: The pulmonic valve was normal in structure. Pulmonic valve regurgitation is not visualized. No evidence of pulmonic stenosis.  Aorta: The aortic root is normal in size and structure. There is mild dilatation of the ascending aorta, measuring 41 mm.  Venous: The inferior vena cava is normal in size with greater than 50% respiratory variability, suggesting right atrial pressure of 3 mmHg.  IAS/Shunts: No atrial level shunt detected by color flow Doppler.   LEFT  VENTRICLE PLAX 2D LVIDd:         3.90 cm LVIDs:         2.70 cm 2D Longitudinal Strain LV PW:         1.00 cm 2D Strain GLS (A4C):   -17.4 % LV IVS:        1.00 cm 2D Strain GLS (A3C):   -23.6 % 2D Strain GLS (A2C):   -16.8 % 2D Strain GLS Avg:     -19.2 %  RIGHT VENTRICLE RV Basal diam:  3.50 cm RV S prime:     7.40 cm/s TAPSE (M-mode): 1.4 cm  LEFT ATRIUM             Index        RIGHT ATRIUM           Index LA diam:        5.10 cm 2.42 cm/m   RA Area:     24.20 cm LA Vol (A2C):   69.1 ml 32.72 ml/m  RA Volume:   79.40 ml  37.60 ml/m LA Vol (A4C):   80.9 ml 38.31 ml/m LA Biplane Vol: 79.1 ml 37.46 ml/m AORTIC VALVE AV Vmax:  225.00 cm/s AV Vmean:          145.000 cm/s AV VTI:            0.380 m AV Peak Grad:      20.2 mmHg AV Mean Grad:      10.0 mmHg LVOT Vmax:         134.00 cm/s LVOT Vmean:        90.500 cm/s LVOT VTI:          0.237 m LVOT/AV VTI ratio: 0.62  AORTA Ao Asc diam: 4.10 cm   SHUNTS Systemic VTI: 0.24 m  Dorothye Gathers MD Electronically signed by Dorothye Gathers MD Signature Date/Time: 01/17/2024/12:16:57 PM    Final   TEE  ECHO INTRAOPERATIVE TEE 12/06/2023  Narrative *INTRAOPERATIVE TRANSESOPHAGEAL REPORT *    Patient Name:   Ascencion Panik Date of Exam: 12/06/2023 Medical Rec #:  [id number]        Height:       68.0 in Accession #:    [id number]       Weight:       223.0 lb Date of Birth:  1946/02/09       BSA:          2.14 m Patient Age:    77 years         BP:           120/68 mmHg Patient Gender: M                HR:           78 bpm. Exam Location:  Anesthesiology  Transesophogeal exam was perform intraoperatively during surgical procedure. Patient was closely monitored under general anesthesia during the entirety of examination.  Indications:     I35.0 Nonrheumatic aortic (valve) stenosis; I25.110 Atherosclerotic heart disease of native coronary artery with unstable angina pectoris Sonographer:     Raynelle Callow  RDCS Performing Phys: [id number] Melene Sportsman Diagnosing Phys: Ellena Gurney MD  Complications: No known complications during this procedure. POST-OP IMPRESSIONS _ Left Ventricle: The left ventricle is unchanged from pre-bypass. _ Aortic Valve: A bioprosthetic valve was placed, leaflets are freely mobile and thin. Manufactured by; inspiris Size; 23mm. Mean PG 8 mmHg across the new AV. _ Mitral Valve: The mitral valve appears unchanged from pre-bypass.  PRE-OP FINDINGS Left Ventricle: The left ventricle has normal systolic function, with an ejection fraction of 60-65%. The cavity size was normal. There is moderately increased left ventricular wall thickness. There is moderate concentric left ventricular hypertrophy.   Right Ventricle: The right ventricle has normal systolic function. The cavity was normal. There is no increase in right ventricular wall thickness.  Left Atrium: Left atrial size was normal in size. No left atrial/left atrial appendage thrombus was detected.  Right Atrium: Right atrial size was normal in size.  Interatrial Septum: Evidence of atrial level shunting detected by color flow Doppler. Agitated saline contrast was given intravenously to evaluate for intracardiac shunting. There is redundancy of the interatrial septum.  Pericardium: Trivial pericardial effusion is present. The pericardial effusion is localized near the right ventricle.  Mitral Valve: The mitral valve is normal in structure. Mitral valve regurgitation is mild by color flow Doppler. There is No evidence of mitral stenosis.  Tricuspid Valve: The tricuspid valve was normal in structure. Tricuspid valve regurgitation is mild by color flow Doppler.  Aortic Valve: The aortic valve is tricuspid Aortic valve regurgitation is mild by color flow Doppler. The jet is centrally-directed.  There is severe stenosis of the aortic valve.   Pulmonic Valve: The pulmonic valve was normal in structure, with  normal. Pulmonic valve regurgitation is trivial by color flow Doppler.   Aorta: The aortic root, ascending aorta and aortic arch are normal in size and structure.  +-------------+---------++ AORTIC VALVE           +-------------+---------++ AV Mean Grad:39.0 mmHg +-------------+---------++   Ellena Gurney MD Electronically signed by Ellena Gurney MD Signature Date/Time: 12/07/2023/7:36:11 AM    Final    CT SCANS  CT CORONARY MORPH W/CTA COR W/SCORE 07/09/2023  Addendum 07/11/2023  9:35 AM ADDENDUM REPORT: 07/11/2023 09:32  FINDINGS: Extracardiac findings will be described separately under dictation for contemporaneously obtained CTA chest, abdomen and pelvis.  IMPRESSION: Please see separate dictation for contemporaneously obtained CTA chest, abdomen and pelvis dated 07/11/2023 for full description of relevant extracardiac findings.   Electronically Signed By: Avelino Lek M.D. On: 07/11/2023 09:32  Narrative CLINICAL DATA:  Aortic valve replacement (TAVR), pre-op eval  EXAM: Cardiac TAVR CT  TECHNIQUE: The patient was scanned on a Siemens Force 192 slice scanner. A 120 kV retrospective scan was triggered in the descending thoracic aorta at 111 HU's. Gantry rotation speed was 270 msecs and collimation was .9 mm. The 3D data set was reconstructed in 5% intervals of the R-R cycle. Systolic and diastolic phases were analyzed on a dedicated work station using MPR, MIP and VRT modes. The patient received 95mL OMNIPAQUE  IOHEXOL  350 MG/ML SOLN of contrast.  FINDINGS: Aortic Valve:  Tricuspid aortic valve with severely reduced cusp excursion. Severely thickened and severely calcified aortic valve cusps.  AV calcium  score: 3022  Virtual Basal Annulus Measurements:  Maximum/Minimum Diameter: 28.7 x 22.2 mm  Perimeter: 79.1 mm  Area:  475 mm2  LVOT calcifications, small protruding calcification below L-N commissure and large protruding  calcification below left coronary cusp.  Membranous septal length: 6 mm  Based on these measurements, the annulus would be suitable for a 26 mm Sapien 3 valve. Alternatively, Heart Team can consider 29 mm Evolut valve. Recommend Heart Team discussion for valve selection.  Sinus of Valsalva Measurements:  Non-coronary:  37 mm  Right - coronary:  35 mm  Left - coronary:  37 mm  Sinus of Valsalva Height:  Left: 25 mm  Right: 24 mm  Aorta: Conventional 3 vessel branch pattern of aortic arch.  Sinotubular Junction:  30 mm  Ascending Thoracic Aorta: 39 mm  Aortic Arch:  30 mm  Descending Thoracic Aorta:  29 mm  Coronary Artery Height above Annulus:  Left circumflex/LAD origins: 18 mm  Right coronary: 21 mm  Coronary Arteries: Normal coronary origin. Right dominance. The study was performed without use of NTG and insufficient for plaque evaluation. Coronary artery calcium  seen in 3 vessel distribution.  Optimum Fluoroscopic Angle for Delivery: LAO 4 CAU 3  OTHER:  Atria: Moderate LA dilation.  Mild RA dilation.  Left atrial appendage: No thrombus.  Mitral valve: Grossly normal, trivial mitral annular calcifications.  Pulmonary artery: Normal caliber.  Pulmonary veins: Normal anatomy.  IMPRESSION: 1. Tricuspid aortic valve with severely reduced cusp excursion. Severely thickened and severely calcified aortic valve cusps. 2. Aortic valve calcium  score: 3022 3. Annulus area: 475 mm2, suitable for 26 mm Sapien 3 valve. LVOT calcifications, small protruding calcification below L-N commissure and large protruding calcification below left coronary cusp. Membranous septal length 6 mm. 4. Sufficient coronary artery heights from annulus. 5. Optimum fluoroscopic angle for delivery: LAO 4  CAU 3  Electronically Signed: By: Grady Lawman M.D. On: 07/10/2023 12:28   CT SCANS  CT CORONARY MORPH W/CTA COR W/SCORE 06/11/2023  Addendum 06/19/2023  2:49 PM ADDENDUM  REPORT: 06/19/2023 14:46  EXAM: OVER-READ INTERPRETATION  CT CHEST  The following report is an over-read performed by radiologist Dr. Cyndia Drape [facility] on 06/19/2023. This over-read does not include interpretation of cardiac or coronary anatomy or pathology. The coronary CTA interpretation by the cardiologist is attached.  COMPARISON:  None.  FINDINGS: Cardiovascular:  Findings discussed in the body of the report.  Mediastinum/Nodes: No suspicious adenopathy identified. Imaged mediastinal structures are unremarkable.  Lungs/Pleura: There is dependent basilar subsegmental atelectasis. No pneumonia or pulmonary edema. No pleural effusion or pneumothorax.  Upper Abdomen: No acute abnormality.  Musculoskeletal: There is mild bilateral gynecomastia. There are thoracic degenerative changes.  IMPRESSION: Mild bilateral gynecomastia. Otherwise no significant extracardiac incidental findings identified.   Electronically Signed By: Sydell Eva M.D. On: 06/19/2023 14:46  Narrative CLINICAL DATA:  This is 78 year old male with anginal symptoms.  EXAM: Cardiac/Coronary  CTA  TECHNIQUE: The patient was scanned on a [employer].  FINDINGS: A 100 kV prospective scan was triggered in the descending thoracic aorta at 111 HU's. Axial non-contrast 3 mm slices were carried out through the heart. The data set was analyzed on a dedicated work station and scored using the Agatson method. Gantry rotation speed was 250 msecs and collimation was .6 mm. No beta blockade and 0.8 mg of sl NTG was given. The 3D data set was reconstructed in 5% intervals of the 67-82 % of the R-R cycle. Diastolic phases were analyzed on a dedicated work station using MPR, MIP and VRT modes. The patient received 80 cc of contrast.  Image Quality: Fair with misregistration artifact.  Aorta: Normal size. Mild aortic root calcifications. No dissection.  Aortic Valve:  Trileaflet. Moderate calcifications (Agatston score 4650).  Coronary Arteries:  Normal coronary origin.  Right dominance.  RCA is a large dominant artery that gives rise to PDA and PLA. The proximal RCA is not well visualized due to artifact. The mid and distal RCA minimal (<24%) focal calcifications.  Short left main artery that gives rise to LAD and LCX arteries. There is minimal calcification in the distal portion of the vessel.  LAD is a large vessel. Diffuse long calcified plaque unable to accurately quantified due to blooming artifact but suspect it is moderate (50-69%) throughout the proximal, mid LAD extending into the first portion of the distal LAD. D1 is small caliber vessel with a focal mild (24-59%) calcified plaque. D2 is a large caliber vessel with moderate calcified plaque in the proximal portion of the vessel then split off to terminate into two branches. The branch on the right with moderate plaque which tapers of as mild. The branch on the right with minimal focal calcified plaque.  LCX is a non-dominant artery that gives rise to one large OM1 branch. The LCX with moderate calcification throughout the proximal to mid LCX. The distal LCX with diffuse mild calcifications.  Coronary Calcium  Score:  Left main: 52  Left anterior descending artery: 1819  Left circumflex artery: 952  Right coronary artery: 847  Total: 3670  Percentile: 96  Other findings:  Normal pulmonary vein drainage into the left atrium.  Normal left atrial appendage without a thrombus.  Normal size of the pulmonary artery.  Moderate mitral annular calcification.  IMPRESSION: 1. Coronary calcium  score of 3670. This  was 96 percentile for age and sex matched control.  2. Normal coronary origin with right dominance.  3. CAD-RADS 3. Moderate stenosis. Consider symptom-guided anti-ischemic pharmacotherapy as well as risk factor modification per guideline directed care. Additional  analysis with CT FFR will be submitted.  4.  Aortic valve calcification (Agatston score 4650).  5.  Moderate mitral annular calcification.  The noncardiac portion of this study will be interpreted in separate report by the radiologist.  Electronically Signed: By: Kardie  Tobb D.O. On: 06/11/2023 15:17     ______________________________________________________________________________________________      07/10/2023: NT-Pro BNP 224 12/21/2023: ALT 204 12/23/2023: Magnesium  2.4 12/24/2023: BUN 55; Creatinine, Ser 2.62; Hemoglobin 8.7; Platelets 225; Potassium 4.1; Sodium 130       ASSESSMENT AND PLAN:   1. S/P AVR (aortic valve replacement)   2. S/P CABG x 2   3. Essential hypertension, benign   4. Hyperlipidemia LDL goal <70   5. Aortic atherosclerosis (HCC)   6. CKD (chronic kidney disease) stage 4, GFR 15-29 ml/min (HCC)   7. Bifascicular block     Aortic stenosis: Mean gradient 10 mmHg and ejection fraction of 66 5% on echocardiogram from March 2025.  Continue aspirin  81 mg. Status post CABG x 2: Continue but decrease to 81 mg.  Continue rosuvastatin  40 mg, Zetia  10 mg, stop Lopressor  50 mg as ejection fraction is normal.*** Hypertension: Continue amlodipine  2.5 mg, stop Lopressor  50 mg as detailed above; ARB?  ARB*** Hyperlipidemia: Check lipid panel, LFTs, and LP(a) today.*** Aortic atherosclerosis: Continue aspirin  81 mg, rosuvastatin  40 mg, Zetia  10 mg.   CKD stage IV: ARB, SGLT2?*** Bifascicular block: Monitor.   Follow up with Dr. Micael Adas in *** months.  I spent *** minutes reviewing all clinical data during and prior to this visit including all relevant imaging studies, laboratories, clinical information from other health systems and prior notes from both Cardiology and other specialties, interviewing the patient, conducting a complete physical examination, and coordinating care in order to formulate a comprehensive and personalized evaluation and treatment  plan.   Shane Melby K Aarti Mankowski, MD  03/23/2024 6:55 AM    [facility] [address] [place], [place], [place]  [address] Phone: [phone]; Fax: [phone]

## 2024-03-24 ENCOUNTER — Encounter: Payer: Medicare HMO | Admitting: Family Medicine

## 2024-03-25 ENCOUNTER — Ambulatory Visit: Attending: Internal Medicine | Admitting: Internal Medicine

## 2024-03-25 ENCOUNTER — Encounter: Payer: Self-pay | Admitting: Internal Medicine

## 2024-03-25 ENCOUNTER — Encounter (HOSPITAL_COMMUNITY)
Admission: RE | Admit: 2024-03-25 | Discharge: 2024-03-25 | Disposition: A | Discharge status: Home / Self Care | Source: Ambulatory Visit | Attending: Cardiology

## 2024-03-25 VITALS — BP 144/66 | HR 93 | Ht 69.0 in | Wt 208.0 lb

## 2024-03-25 DIAGNOSIS — Z951 Presence of aortocoronary bypass graft: Secondary | ICD-10-CM

## 2024-03-25 DIAGNOSIS — Z952 Presence of prosthetic heart valve: Secondary | ICD-10-CM

## 2024-03-25 DIAGNOSIS — E785 Hyperlipidemia, unspecified: Secondary | ICD-10-CM | POA: Diagnosis not present

## 2024-03-25 DIAGNOSIS — I10 Essential (primary) hypertension: Secondary | ICD-10-CM | POA: Diagnosis not present

## 2024-03-25 DIAGNOSIS — N184 Chronic kidney disease, stage 4 (severe): Secondary | ICD-10-CM | POA: Diagnosis not present

## 2024-03-25 DIAGNOSIS — I452 Bifascicular block: Secondary | ICD-10-CM

## 2024-03-25 DIAGNOSIS — I700 Atherosclerosis of aorta: Secondary | ICD-10-CM | POA: Diagnosis not present

## 2024-03-25 MED ORDER — ASPIRIN 81 MG PO TBEC: 81.0000 mg | DELAYED_RELEASE_TABLET | Freq: Every day | ORAL | Status: DC

## 2024-03-25 MED ORDER — AMLODIPINE BESYLATE 5 MG PO TABS: 5.0000 mg | ORAL_TABLET | Freq: Every day | ORAL | 3 refills | Status: AC

## 2024-03-25 NOTE — Patient Instructions (Signed)
 Medication Instructions:  Your physician has recommended you make the following change in your medication:  1.) stop Lopressor  (metoprolol  tartrate) 2.) decrease aspirin  to 81 mg  one tablet daily 3.) increase amlodipine  to 5 mg - one tablet daily  *If you need a refill on your cardiac medications before your next appointment, please call your pharmacy*  Lab Work: Today: lipids, cmet, Lpa  If you have labs (blood work) drawn today and your tests are completely normal, you will receive your results only by: MyChart Message (if you have MyChart) OR A paper copy in the mail If you have any lab test that is abnormal or we need to change your treatment, we will call you to review the results.  Testing/Procedures: none  Follow-Up: At [facility], you and your health needs are our priority.  As part of our continuing mission to provide you with exceptional heart care, our providers are all part of one team.  This team includes your primary Cardiologist (physician) and Advanced Practice Providers or APPs (Physician Assistants and Nurse Practitioners) who all work together to provide you with the care you need, when you need it.  Your next appointment:   12 month(s)  Provider:   Gaylyn Keas, MD

## 2024-03-26 ENCOUNTER — Ambulatory Visit: Payer: Self-pay | Admitting: Internal Medicine

## 2024-03-26 DIAGNOSIS — Z79899 Other long term (current) drug therapy: Secondary | ICD-10-CM

## 2024-03-26 LAB — LIPOPROTEIN A (LPA): Lipoprotein (a): 19.1 nmol/L (ref <75.0)

## 2024-03-26 LAB — COMPREHENSIVE METABOLIC PANEL WITH GFR
ALT: 56 IU/L — ABNORMAL HIGH (ref 0–44)
AST: 56 IU/L — ABNORMAL HIGH (ref 0–40)
Albumin: 4.6 g/dL (ref 3.8–4.8)
Alkaline Phosphatase: 79 IU/L (ref 44–121)
BUN/Creatinine Ratio: 18 (ref 10–24)
BUN: 22 mg/dL (ref 8–27)
Bilirubin Total: 0.6 mg/dL (ref 0.0–1.2)
CO2: 20 mmol/L (ref 20–29)
Calcium: 9.7 mg/dL (ref 8.6–10.2)
Chloride: 102 mmol/L (ref 96–106)
Creatinine, Ser: 1.25 mg/dL (ref 0.76–1.27)
Globulin, Total: 2.3 g/dL (ref 1.5–4.5)
Glucose: 138 mg/dL — ABNORMAL HIGH (ref 70–99)
Potassium: 4.3 mmol/L (ref 3.5–5.2)
Sodium: 139 mmol/L (ref 134–144)
Total Protein: 6.9 g/dL (ref 6.0–8.5)
eGFR: 60 mL/min/{1.73_m2} (ref >59)

## 2024-03-26 LAB — LIPID PANEL
Chol/HDL Ratio: 2.9 ratio (ref 0.0–5.0)
Cholesterol, Total: 149 mg/dL (ref 100–199)
HDL: 51 mg/dL (ref >39)
LDL Chol Calc (NIH): 74 mg/dL (ref 0–99)
Triglycerides: 139 mg/dL (ref 0–149)
VLDL Cholesterol Cal: 24 mg/dL (ref 5–40)

## 2024-03-27 ENCOUNTER — Encounter (HOSPITAL_COMMUNITY)

## 2024-03-27 MED ORDER — LOSARTAN POTASSIUM 25 MG PO TABS: 25.0000 mg | ORAL_TABLET | Freq: Every day | ORAL | 3 refills | Status: AC

## 2024-03-27 NOTE — Telephone Encounter (Signed)
 Called and spoke with patient who verbalized understanding and agrees to plan. Medication sent to pharmacy requested. BMET entered and released to be done in 1 week.

## 2024-03-27 NOTE — Telephone Encounter (Signed)
-----   Message from Arun K Thukkani sent at 03/26/2024  8:59 AM EDT ----- Start losartan 25 mg and BMP in 1 week.

## 2024-03-30 ENCOUNTER — Ambulatory Visit: Payer: Self-pay | Admitting: Family Medicine

## 2024-03-30 ENCOUNTER — Ambulatory Visit (INDEPENDENT_AMBULATORY_CARE_PROVIDER_SITE_OTHER): Payer: Medicare HMO | Admitting: Family Medicine

## 2024-03-30 VITALS — BP 141/80 | HR 92 | Temp 98.5°F | Ht 67.5 in | Wt 213.4 lb

## 2024-03-30 DIAGNOSIS — Z125 Encounter for screening for malignant neoplasm of prostate: Secondary | ICD-10-CM | POA: Diagnosis not present

## 2024-03-30 DIAGNOSIS — R7303 Prediabetes: Secondary | ICD-10-CM | POA: Diagnosis not present

## 2024-03-30 DIAGNOSIS — E875 Hyperkalemia: Secondary | ICD-10-CM | POA: Diagnosis not present

## 2024-03-30 DIAGNOSIS — Z1211 Encounter for screening for malignant neoplasm of colon: Secondary | ICD-10-CM | POA: Diagnosis not present

## 2024-03-30 DIAGNOSIS — Z0000 Encounter for general adult medical examination without abnormal findings: Secondary | ICD-10-CM

## 2024-03-30 DIAGNOSIS — I10 Essential (primary) hypertension: Secondary | ICD-10-CM | POA: Diagnosis not present

## 2024-03-30 DIAGNOSIS — H6123 Impacted cerumen, bilateral: Secondary | ICD-10-CM

## 2024-03-30 DIAGNOSIS — R972 Elevated prostate specific antigen [PSA]: Secondary | ICD-10-CM

## 2024-03-30 DIAGNOSIS — E039 Hypothyroidism, unspecified: Secondary | ICD-10-CM

## 2024-03-30 DIAGNOSIS — H6120 Impacted cerumen, unspecified ear: Secondary | ICD-10-CM | POA: Insufficient documentation

## 2024-03-30 DIAGNOSIS — E7800 Pure hypercholesterolemia, unspecified: Secondary | ICD-10-CM

## 2024-03-30 LAB — CBC WITH DIFFERENTIAL/PLATELET
Basophils Absolute: 0 10*3/uL (ref 0.0–0.1)
Basophils Relative: 0.8 % (ref 0.0–3.0)
Eosinophils Absolute: 0.3 10*3/uL (ref 0.0–0.7)
Eosinophils Relative: 4.5 % (ref 0.0–5.0)
HCT: 34.4 % — ABNORMAL LOW (ref 39.0–52.0)
Hemoglobin: 11.7 g/dL — ABNORMAL LOW (ref 13.0–17.0)
Lymphocytes Relative: 35.6 % (ref 12.0–46.0)
Lymphs Abs: 2 10*3/uL (ref 0.7–4.0)
MCHC: 33.9 g/dL (ref 30.0–36.0)
MCV: 92.3 fl (ref 78.0–100.0)
Monocytes Absolute: 0.5 10*3/uL (ref 0.1–1.0)
Monocytes Relative: 9.1 % (ref 3.0–12.0)
Neutro Abs: 2.9 10*3/uL (ref 1.4–7.7)
Neutrophils Relative %: 50 % (ref 43.0–77.0)
Platelets: 121 10*3/uL — ABNORMAL LOW (ref 150.0–400.0)
RBC: 3.73 Mil/uL — ABNORMAL LOW (ref 4.22–5.81)
RDW: 14.6 % (ref 11.5–15.5)
WBC: 5.7 10*3/uL (ref 4.0–10.5)

## 2024-03-30 LAB — HEMOGLOBIN A1C: Hgb A1c MFr Bld: 5.9 % (ref 4.6–6.5)

## 2024-03-30 LAB — TSH: TSH: 9.99 u[IU]/mL — ABNORMAL HIGH (ref 0.35–5.50)

## 2024-03-30 MED ORDER — LEVOTHYROXINE SODIUM 50 MCG PO TABS: 50.0000 ug | ORAL_TABLET | Freq: Every day | ORAL | 0 refills | Status: DC

## 2024-03-30 NOTE — Assessment & Plan Note (Signed)
 Urology recommends no further screening

## 2024-03-30 NOTE — Assessment & Plan Note (Signed)
 A1c ordered  disc imp of low glycemic diet and wt loss to prevent DM2

## 2024-03-30 NOTE — Assessment & Plan Note (Signed)
 TSH ordered Taking levothyroxine  25 mcg daily No clinical changes

## 2024-03-30 NOTE — Assessment & Plan Note (Signed)
 Reviewed health habits including diet and exercise and skin cancer prevention Reviewed appropriate screening tests for age  Also reviewed health mt list, fam hx and immunization status , as well as social and family history   See HPI Labs reviewed and ordered Health Maintenance  Topic Date Due   Medicare Annual Wellness Visit  03/13/2023   DTaP/Tdap/Td vaccine (3 - Td or Tdap) 03/30/2025*   COVID-19 Vaccine (6 - 2024-25 season) 04/15/2025*   Flu Shot  05/29/2024   Colon Cancer Screening  09/20/2024   Pneumonia Vaccine  Completed   Hepatitis C Screening  Completed   Zoster (Shingles) Vaccine  Completed   HPV Vaccine  Aged Out   Meningitis B Vaccine  Aged Out  *Topic was postponed. The date shown is not the original due date.    Due for Td-plans to get at pharmacy  Urology recommended no further psa /prostate screen  Colonoscopy due in nov 2025 Discussed fall prevention, supplements and exercise for bone density  PHQ 0

## 2024-03-30 NOTE — Assessment & Plan Note (Signed)
 Saw urology  Normal exam No symptoms  Recommended no further psa screening

## 2024-03-30 NOTE — Assessment & Plan Note (Signed)
 Colonoscopy 08/2021 with 3 y recall due to polyps  Will be due in nov

## 2024-03-30 NOTE — Progress Notes (Signed)
 Subjective:    Patient ID: [name], male    DOB: [birth year], 78 y.o.   MRN: [id number]  HPI  Here for health maintenance exam and to review chronic medical problems   Wt Readings from Last 3 Encounters:  03/30/24 213 lb 6 oz (96.8 kg)  03/25/24 208 lb (94.3 kg)  02/04/24 212 lb 11.9 oz (96.5 kg)   32.93 kg/m  Vitals:   03/30/24 0922 03/30/24 0954  BP: (!) 158/78 (!) 141/80  Pulse: 92   Temp: 98.5 F (36.9 C)   SpO2: 100%     Immunization History  Administered Date(s) Administered   Fluad Quad(high Dose 65+) 07/16/2019, 07/03/2022   Influenza, High Dose Seasonal PF 08/17/2017, 08/23/2021, 09/30/2023   Influenza,inj,Quad PF,6+ Mos 08/26/2018   Influenza-Unspecified 08/29/2017   PFIZER Comirnaty(Gray Top)Covid-19 Tri-Sucrose Vaccine 02/02/2021   PFIZER(Purple Top)SARS-COV-2 Vaccination 12/05/2019, 12/28/2019, 08/02/2020   Pfizer Covid-19 Vaccine Bivalent Booster 35yrs & up 09/06/2021   Pneumococcal Conjugate-13 01/18/2015   Pneumococcal Polysaccharide-23 01/20/2016   Td 11/11/2002   Tdap 01/12/2013   Zoster Recombinant(Shingrix) 09/06/2021, 12/26/2021    Health Maintenance Due  Topic Date Due   Medicare Annual Wellness (AWV)  03/13/2023   Tetanus shot 2014     Prostate health Lab Results  Component Value Date   PSA 3.01 03/15/2023   PSA 3.22 03/05/2022   PSA 1.50 03/01/2021   Urology follow up  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  Saw urology in July  Based on normal prostate exam and psa recommended stopping psa testing    Colon cancer screening -colonoscopy 08/2021 with 3 y recall   Bone health   Falls-none  Fractures=-none  Supplements    Exercise : cardiac rehab  [facility]  Excited to get back to outdoors things   Mood    03/30/2024    9:22 AM 02/04/2024   11:05 AM 07/05/2023    8:03 AM 05/27/2023    7:51 AM 03/22/2023   10:25 AM  Depression screen PHQ 2/9  Decreased Interest 0 0 0 0 0  Down, Depressed, Hopeless  0 0 0 0 0  PHQ - 2 Score 0 0 0 0 0  Altered sleeping  0 0 0 1  Tired, decreased energy  1 0 0 0  Change in appetite  0 0 0 0  Feeling bad or failure about yourself   0 0 0 0  Trouble concentrating  0 0 0 0  Moving slowly or fidgety/restless  0 0 0 0  Suicidal thoughts  0 0 0 0  PHQ-9 Score  1 0 0 1  Difficult doing work/chores  Somewhat difficult Not difficult at all Not difficult at all Not difficult at all   HTN (with CAD)  bp is stable today  No cp or palpitations or headaches or edema  No side effects to medicines  BP Readings from Last 3 Encounters:  03/30/24 (!) 141/80  03/25/24 (!) 144/66  02/04/24 130/62     Metoprolol  was stopped  Losartan  25 mg -has not started that / picking that up today Amlodipine  5 mg daily    In cardiac rehab now  Is going well  Making progress   Learning to cook as well Eating much healthier   Stamina is getting better   Lab Results  Component Value Date   NA 139 03/25/2024   K 4.3 03/25/2024   CO2 20 03/25/2024   GLUCOSE 138 (H) 03/25/2024   BUN 22 03/25/2024  CREATININE 1.25 03/25/2024   CALCIUM  9.7 03/25/2024   GFR 83.92 07/05/2023   EGFR 60 03/25/2024   GFRNONAA 25 (L) 12/24/2023    Lab Results  Component Value Date   ALT 56 (H) 03/25/2024   AST 56 (H) 03/25/2024   ALKPHOS 79 03/25/2024   BILITOT 0.6 03/25/2024   Alcohol intake  4 beers per day and a few vodka in the evening  Is open to cutting it back  Can do w/o the beer    Hypothyroidism  Pt has no clinical changes No change in energy level/ hair or skin/ edema and no tremor Lab Results  Component Value Date   TSH 2.67 03/15/2023   Levothyroxine  25 mcg daily   Lab Results  Component Value Date   WBC 8.7 12/24/2023   HGB 8.7 (L) 12/24/2023   HCT 25.2 (L) 12/24/2023   MCV 84.6 12/24/2023   PLT 225 12/24/2023   This was after surgery    Hyperlipidemia  Lab Results  Component Value Date   CHOL 149 03/25/2024   HDL 51 03/25/2024   LDLCALC 74  03/25/2024   LDLDIRECT 130.0 01/06/2013   TRIG 139 03/25/2024   CHOLHDL 2.9 03/25/2024   Zetia  10 mg daily  Crestor  40 mg daily   Prediabetes Lab Results  Component Value Date   HGBA1C 5.7 (H) 12/04/2023   HGBA1C 5.8 07/05/2023   HGBA1C 5.5 03/15/2023      Patient Active Problem List   Diagnosis Date Noted   Cerumen impaction 03/30/2024   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   Ascending aorta dilatation (HCC)    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   Aortic stenosis 07/05/2023   CAD (coronary artery disease) 07/05/2023   Urinary hesitancy 03/22/2023   Hyperkalemia 03/22/2023   Hypothyroid 03/08/2021   History of colon polyps 03/08/2021   Alcohol intake above recommended sensible limits 05/13/2019   Hyperlipidemia 02/25/2019   Colon cancer screening 02/12/2017   Routine general medical examination at a health care facility 01/20/2016   Heart murmur, systolic 01/13/2014   Elevated PSA 01/13/2014   Encounter for Medicare annual wellness exam 09/24/2011   Prostate cancer screening 09/24/2011   Osteoarthritis, knee 02/14/2011   Tinnitus 12/09/2009   BACK PAIN, LUMBAR, WITH RADICULOPATHY 10/10/2009   Essential hypertension, benign 08/16/2009   Thrombocytopenia (HCC) 02/15/2009   Prediabetes 01/07/2008   Past Medical History:  Diagnosis Date   Aortic stenosis    s/p  23 mm Inspiris valve present in the aortic position.  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 12/2023   Back pain    Coronary artery disease    Diverticulosis    Heart murmur    History of colon polyps    Hyperlipidemia    on meds   Hypertension    on meds   Hypothyroidism    Moderate aortic stenosis    Pre-diabetes    Thyroid  disease    on meds   Past Surgical History:  Procedure Laterality Date   AORTIC VALVE REPLACEMENT N/A 12/06/2023   Procedure: AORTIC VALVE REPLACEMENT (AVR)  USING INSPIRIS AORTIC VALVE SIZE 23 MM;  Surgeon: Melene Sportsman, MD;  Location: [facility] OR;  Service: Open Heart Surgery;  Laterality: N/A;   CARDIOVERSION  12/20/2023   Procedure: CARDIOVERSION;  Surgeon: Melene Sportsman, MD;  Location: [facility] OR;  Service: Thoracic;;   COLONOSCOPY  2018   SA-MAC-suprep-ageq with lavage-tics/int hems/TA x 5 frags   CORONARY ARTERY BYPASS GRAFT N/A 12/06/2023   Procedure: CORONARY ARTERY BYPASS GRAFTING (CABG) TIMES TWO USING LEFT INTERNAL MAMMARY ARTERY AND ENDOSCOPICALLY HARVESTED RIGHT GREATER SAPHENOUS VEIN;  Surgeon: Melene Sportsman, MD;  Location: [facility] OR;  Service: Open Heart Surgery;  Laterality: N/A;   INGUINAL HERNIA REPAIR Bilateral 02/13/2017   Procedure: LAPAROSCOPIC BILATERAL INGUINAL HERNIA REPAIR WITH MESH;  Surgeon: Oza Blumenthal, MD;  Location: [facility] OR;  Service: General;  Laterality: Bilateral;   LASIK     PERICARDIAL FLUID DRAINAGE  12/20/2023   Procedure: DRAINAGE OF PERICARDIAL FLUID;  Surgeon: Melene Sportsman, MD;  Location: [facility] OR;  Service: Thoracic;;   POLYPECTOMY  2018   TA x 5   RIGHT HEART CATH AND CORONARY ANGIOGRAPHY N/A 07/03/2023   Procedure: RIGHT HEART CATH AND CORONARY ANGIOGRAPHY;  Surgeon: Kyra Phy, MD;  Location: [facility] INVASIVE [facility];  Service: Cardiovascular;  Laterality: N/A;   SUBXYPHOID PERICARDIAL WINDOW N/A 12/20/2023   Procedure: SUBXYPHOID PERICARDIAL WINDOW WITH CONVERSION TO STERNOTOMY. REPAIR OF RV INJURY;  Surgeon: Melene Sportsman, MD;  Location: [facility] OR;  Service: Thoracic;  Laterality: N/A;   TEE WITHOUT CARDIOVERSION N/A 12/06/2023   Procedure: TRANSESOPHAGEAL ECHOCARDIOGRAM (TEE);  Surgeon: Melene Sportsman, MD;  Location: [facility] OR;  Service: Open Heart Surgery;  Laterality: N/A;   TEE WITHOUT CARDIOVERSION N/A 12/20/2023   Procedure: TRANSESOPHAGEAL ECHOCARDIOGRAM (TEE);  Surgeon: Melene Sportsman, MD;  Location: [facility] OR;  Service: Thoracic;  Laterality: N/A;   UMBILICAL HERNIA REPAIR N/A 02/13/2017   Procedure: LAPAROSCOPIC UMBILICAL  HERNIA REPAIR;  Surgeon: Oza Blumenthal, MD;  Location: [facility] OR;  Service: General;  Laterality: N/A;   WISDOM TOOTH EXTRACTION     Social History   Tobacco Use   Smoking status: Never   Smokeless tobacco: Never  Vaping Use   Vaping status: Never Used  Substance Use Topics   Alcohol use: Yes    Alcohol/week: 21.0 - 28.0 standard drinks of alcohol    Types: 21 - 28 Cans of beer per week    Comment: 3-4 cans of beer daily   Drug use: No   Family History  Problem Relation Age of Onset   Cancer Mother        brain tumor   Emphysema Father    Diabetes Father    HIV Brother    Colon cancer Neg Hx    Colon polyps Neg Hx    Esophageal cancer Neg Hx    Rectal cancer Neg Hx    Stomach cancer Neg Hx    Allergies  Allergen Reactions   Vytorin [Ezetimibe -Simvastatin] Other (See Comments)    back pain   Current Outpatient Medications on File Prior to Visit  Medication Sig Dispense Refill   amLODipine  (NORVASC ) 5 MG tablet Take 1 tablet (5 mg total) by mouth daily. 90 tablet 3   aspirin  EC 81 MG tablet Take 1 tablet (81 mg total) by mouth daily. Swallow whole.     ezetimibe  (ZETIA ) 10 MG tablet Take 1 tablet (10 mg total) by mouth daily. 90 tablet 3   levothyroxine  (SYNTHROID ) 25 MCG tablet TAKE 1 TABLET BY MOUTH EVERY DAY BEFORE BREAKFAST 90 tablet 1   losartan  (COZAAR ) 25 MG tablet Take 1 tablet (25 mg total) by mouth daily. 90 tablet 3   Multiple Vitamin (MULTIVITAMIN WITH MINERALS) TABS tablet Take 1 tablet by mouth in the morning. Centrum Silver  rosuvastatin  (CRESTOR ) 40 MG tablet Take 1 tablet (40 mg total) by mouth daily. 90 tablet 3   No current facility-administered medications on file prior to visit.    Review of Systems  Constitutional:  Negative for activity change, appetite change, fatigue, fever and unexpected weight change.       Getting stamina back in cardiac rehab   HENT:  Negative for congestion, rhinorrhea, sore throat and trouble swallowing.   Eyes:   Negative for pain, redness, itching and visual disturbance.  Respiratory:  Negative for cough, chest tightness, shortness of breath and wheezing.   Cardiovascular:  Negative for chest pain and palpitations.  Gastrointestinal:  Negative for abdominal pain, blood in stool, constipation, diarrhea and nausea.  Endocrine: Negative for cold intolerance, heat intolerance, polydipsia and polyuria.  Genitourinary:  Negative for difficulty urinating, dysuria, frequency and urgency.  Musculoskeletal:  Positive for arthralgias. Negative for joint swelling and myalgias.  Skin:  Negative for pallor and rash.  Neurological:  Negative for dizziness, tremors, weakness, numbness and headaches.  Hematological:  Negative for adenopathy. Does not bruise/bleed easily.  Psychiatric/Behavioral:  Negative for decreased concentration and dysphoric mood. The patient is not nervous/anxious.        Objective:   Physical Exam Constitutional:      General: He is not in acute distress.    Appearance: Normal appearance. He is well-developed. He is obese. He is not ill-appearing or diaphoretic.  HENT:     Head: Normocephalic and atraumatic.     Right Ear: Tympanic membrane, ear canal and external ear normal. There is impacted cerumen.     Left Ear: Tympanic membrane, ear canal and external ear normal. There is impacted cerumen.     Nose: Nose normal. No congestion.     Mouth/Throat:     Mouth: Mucous membranes are moist.     Pharynx: Oropharynx is clear. No posterior oropharyngeal erythema.  Eyes:     General: No scleral icterus.       Right eye: No discharge.        Left eye: No discharge.     Conjunctiva/sclera: Conjunctivae normal.     Pupils: Pupils are equal, round, and reactive to light.  Neck:     Thyroid : No thyromegaly.     Vascular: No carotid bruit or JVD.  Cardiovascular:     Rate and Rhythm: Normal rate and regular rhythm.     Pulses: Normal pulses.     Heart sounds: Murmur heard.     No gallop.   Pulmonary:     Effort: Pulmonary effort is normal. No respiratory distress.     Breath sounds: Normal breath sounds. No wheezing or rales.     Comments: Good air exch Chest:     Chest wall: No tenderness.  Abdominal:     General: Bowel sounds are normal. There is no distension or abdominal bruit.     Palpations: Abdomen is soft. There is no mass.     Tenderness: There is no abdominal tenderness.     Hernia: No hernia is present.  Musculoskeletal:        General: No tenderness.     Cervical back: Normal range of motion and neck supple. No rigidity. No muscular tenderness.     Right lower leg: No edema.     Left lower leg: No edema.  Lymphadenopathy:     Cervical: No cervical adenopathy.  Skin:    General: Skin is warm and dry.     Coloration: Skin  is not pale.     Findings: No erythema or rash.     Comments: Solar lentigines diffusely  Neurological:     Mental Status: He is alert.     Cranial Nerves: No cranial nerve deficit.     Motor: No abnormal muscle tone.     Coordination: Coordination normal.     Gait: Gait normal.     Deep Tendon Reflexes: Reflexes are normal and symmetric. Reflexes normal.  Psychiatric:        Mood and Affect: Mood normal.        Cognition and Memory: Cognition and memory normal.           Assessment & Plan:   Problem List Items Addressed This Visit       Cardiovascular and Mediastinum   Essential hypertension, benign   bp not at goal BP Readings from Last 1 Encounters:  03/30/24 (!) 141/80   No changes needed Most recent labs reviewed  Disc lifstyle change with low sodium diet and exercise   Off metoprolol   Losartan  25 mg was prescribed by his cardiologist - has not started yet Amlodipine  5 mg daily   Plan follow up in 1 mo       Relevant Orders   TSH   CBC with Differential/Platelet     Endocrine   Hypothyroid   TSH ordered Taking levothyroxine  25 mcg daily No clinical changes         Nervous and Auditory    Cerumen impaction   Bilateral Encouraged to use debrox twice weekly over the counter as directed and follow up in a month for irrigation         Other   Routine general medical examination at a health care facility - Primary   Reviewed health habits including diet and exercise and skin cancer prevention Reviewed appropriate screening tests for age  Also reviewed health mt list, fam hx and immunization status , as well as social and family history   See HPI Labs reviewed and ordered Health Maintenance  Topic Date Due   Medicare Annual Wellness Visit  03/13/2023   DTaP/Tdap/Td vaccine (3 - Td or Tdap) 03/30/2025*   COVID-19 Vaccine (6 - 2024-25 season) 04/15/2025*   Flu Shot  05/29/2024   Colon Cancer Screening  09/20/2024   Pneumonia Vaccine  Completed   Hepatitis C Screening  Completed   Zoster (Shingles) Vaccine  Completed   HPV Vaccine  Aged Out   Meningitis B Vaccine  Aged Out  *Topic was postponed. The date shown is not the original due date.    Due for Td-plans to get at pharmacy  Urology recommended no further psa /prostate screen  Colonoscopy due in nov 2025 Discussed fall prevention, supplements and exercise for bone density  PHQ 0      Prostate cancer screening   Urology recommends no further screening       Prediabetes   A1c ordered disc imp of low glycemic diet and wt loss to prevent DM2       Relevant Orders   Hemoglobin A1c   Hyperlipidemia   Disc goals for lipids and reasons to control them Rev last labs with pt Rev low sat fat diet in detail LDL Of 74  On zetia  10 mg daily  Crestor  40 mg daily  Seeing cardiology for CAD      Hyperkalemia   Last K was normal but pt did have increase K on ace in past  Cardiology did prescribe losartan   so will need to watch for this again       Elevated PSA   Saw urology  Normal exam No symptoms  Recommended no further psa screening       Colon cancer screening   Colonoscopy 08/2021 with 3 y recall due  to polyps  Will be due in nov

## 2024-03-30 NOTE — Assessment & Plan Note (Signed)
 Last K was normal but pt did have increase K on ace in past  Cardiology did prescribe losartan  so will need to watch for this again

## 2024-03-30 NOTE — Assessment & Plan Note (Signed)
 Disc goals for lipids and reasons to control them Rev last labs with pt Rev low sat fat diet in detail LDL Of 74  On zetia  10 mg daily  Crestor  40 mg daily  Seeing cardiology for CAD

## 2024-03-30 NOTE — Patient Instructions (Addendum)
 If possible -please cut alcohol to 2 drinks per day or less   Let's re check liver tests in about a month   Start you losartan    Keep up good water intake   Get your tetanus shot updated at the pharmacy   Get some debrox over the counter for ears and use it twice weekly as directed for ear wax Follow up in about a month for  Ear flush  Blood pressure check  Labs for for liver   Lab today

## 2024-03-30 NOTE — Assessment & Plan Note (Signed)
 Bilateral Encouraged to use debrox twice weekly over the counter as directed and follow up in a month for irrigation

## 2024-03-30 NOTE — Assessment & Plan Note (Signed)
 bp not at goal BP Readings from Last 1 Encounters:  03/30/24 (!) 141/80   No changes needed Most recent labs reviewed  Disc lifstyle change with low sodium diet and exercise   Off metoprolol   Losartan  25 mg was prescribed by his cardiologist - has not started yet Amlodipine  5 mg daily   Plan follow up in 1 mo

## 2024-04-01 ENCOUNTER — Encounter: Payer: Self-pay | Admitting: Cardiology

## 2024-04-01 ENCOUNTER — Encounter (HOSPITAL_COMMUNITY)
Admission: RE | Admit: 2024-04-01 | Discharge: 2024-04-01 | Disposition: A | Discharge status: Home / Self Care | Source: Ambulatory Visit | Attending: Cardiology | Admitting: Cardiology

## 2024-04-01 ENCOUNTER — Other Ambulatory Visit: Payer: Self-pay

## 2024-04-01 ENCOUNTER — Telehealth (HOSPITAL_COMMUNITY): Payer: Self-pay | Admitting: *Deleted

## 2024-04-01 DIAGNOSIS — Z952 Presence of prosthetic heart valve: Secondary | ICD-10-CM | POA: Insufficient documentation

## 2024-04-01 DIAGNOSIS — I77810 Thoracic aortic ectasia: Secondary | ICD-10-CM | POA: Diagnosis present

## 2024-04-01 DIAGNOSIS — Z951 Presence of aortocoronary bypass graft: Secondary | ICD-10-CM | POA: Diagnosis present

## 2024-04-01 DIAGNOSIS — E7800 Pure hypercholesterolemia, unspecified: Secondary | ICD-10-CM

## 2024-04-01 MED ORDER — EZETIMIBE 10 MG PO TABS
10.0000 mg | ORAL_TABLET | Freq: Every day | ORAL | 3 refills | Status: AC
Start: 2024-04-01 — End: ?

## 2024-04-01 NOTE — Progress Notes (Signed)
 Medication changes noted per Dr Lorie Rook. Entry blood pressure 140/80 heart rate 95. Metoprolol  has been discontinued. Patient exceeded heart rate noted as high as 141. Telemetry rhythm Sinus rhythm, Sinus tach. Will consult with onsite provider Katlyn West NP. Max BP 162/61 with exercise. Katlyn West NP  onsite provider reviewed today's ECG tracings. No new orders received. Exit heart rate 105. BP 140/80. Will forward today's ECG tracings to Dr Lorie Rook for review and continue to monitor the patient.Monte Antonio RN BSN

## 2024-04-01 NOTE — Telephone Encounter (Signed)
-----   Message from Arun K Thukkani sent at 04/01/2024  1:17 PM EDT ----- I think if he is asymptomatic this is ok.  Thanks. ----- Message ----- From: Cyndee Dragon, RN Sent: 04/01/2024   1:03 PM EDT To: Lowell Rude, RN; Arun K Thukkani, MD  Good afternoon Dr Lorie Rook was noted to exceed his target heart rate this morning as high as 141. I did have him to stop exercising for a short period of time to bring his heart rate down. Please review attached media from today. I saw that you discontinued Jeff's metoprolol  and wanted to know if you would review and consider adding a small dose back if you think appropriate.  I appreciate your input and thank you for your assistance, Sincerely, [facility]  Cardiac Rehab

## 2024-04-03 ENCOUNTER — Encounter (HOSPITAL_COMMUNITY)
Admission: RE | Admit: 2024-04-03 | Discharge: 2024-04-03 | Disposition: A | Discharge status: Home / Self Care | Source: Ambulatory Visit | Attending: Cardiology | Admitting: Cardiology

## 2024-04-03 DIAGNOSIS — Z951 Presence of aortocoronary bypass graft: Secondary | ICD-10-CM | POA: Diagnosis not present

## 2024-04-03 DIAGNOSIS — Z952 Presence of prosthetic heart valve: Secondary | ICD-10-CM

## 2024-04-06 ENCOUNTER — Encounter (HOSPITAL_COMMUNITY)
Admission: RE | Admit: 2024-04-06 | Discharge: 2024-04-06 | Disposition: A | Discharge status: Home / Self Care | Source: Ambulatory Visit | Attending: Cardiology | Admitting: Cardiology

## 2024-04-06 DIAGNOSIS — Z952 Presence of prosthetic heart valve: Secondary | ICD-10-CM

## 2024-04-06 DIAGNOSIS — I77810 Thoracic aortic ectasia: Secondary | ICD-10-CM

## 2024-04-06 DIAGNOSIS — Z951 Presence of aortocoronary bypass graft: Secondary | ICD-10-CM

## 2024-04-08 ENCOUNTER — Encounter (HOSPITAL_COMMUNITY)
Admission: RE | Admit: 2024-04-08 | Discharge: 2024-04-08 | Disposition: A | Discharge status: Home / Self Care | Source: Ambulatory Visit | Attending: Cardiology | Admitting: Cardiology

## 2024-04-08 DIAGNOSIS — Z952 Presence of prosthetic heart valve: Secondary | ICD-10-CM

## 2024-04-08 DIAGNOSIS — Z951 Presence of aortocoronary bypass graft: Secondary | ICD-10-CM

## 2024-04-08 DIAGNOSIS — I77810 Thoracic aortic ectasia: Secondary | ICD-10-CM

## 2024-04-08 DIAGNOSIS — Z79899 Other long term (current) drug therapy: Secondary | ICD-10-CM | POA: Diagnosis not present

## 2024-04-09 LAB — BASIC METABOLIC PANEL WITH GFR
BUN/Creatinine Ratio: 15 (ref 10–24)
BUN: 16 mg/dL (ref 8–27)
CO2: 17 mmol/L — ABNORMAL LOW (ref 20–29)
Calcium: 9.6 mg/dL (ref 8.6–10.2)
Chloride: 102 mmol/L (ref 96–106)
Creatinine, Ser: 1.08 mg/dL (ref 0.76–1.27)
Glucose: 121 mg/dL — ABNORMAL HIGH (ref 70–99)
Potassium: 5 mmol/L (ref 3.5–5.2)
Sodium: 141 mmol/L (ref 134–144)
eGFR: 72 mL/min/{1.73_m2} (ref >59)

## 2024-04-10 ENCOUNTER — Encounter (HOSPITAL_COMMUNITY)
Admission: RE | Admit: 2024-04-10 | Discharge: 2024-04-10 | Disposition: A | Discharge status: Home / Self Care | Source: Ambulatory Visit | Attending: Cardiology | Admitting: Cardiology

## 2024-04-10 DIAGNOSIS — Z951 Presence of aortocoronary bypass graft: Secondary | ICD-10-CM

## 2024-04-10 DIAGNOSIS — Z952 Presence of prosthetic heart valve: Secondary | ICD-10-CM

## 2024-04-13 ENCOUNTER — Encounter (HOSPITAL_COMMUNITY)
Admission: RE | Admit: 2024-04-13 | Discharge: 2024-04-13 | Disposition: A | Discharge status: Home / Self Care | Source: Ambulatory Visit | Attending: Cardiology

## 2024-04-13 DIAGNOSIS — Z951 Presence of aortocoronary bypass graft: Secondary | ICD-10-CM | POA: Diagnosis not present

## 2024-04-13 DIAGNOSIS — Z952 Presence of prosthetic heart valve: Secondary | ICD-10-CM

## 2024-04-14 NOTE — Progress Notes (Signed)
 Cardiac Individual Treatment Plan  Patient Details  Name: [name] MRN: [id number] Date of Birth: Jul 11, 1946 Referring Provider:   Flowsheet Row INTENSIVE CARDIAC REHAB ORIENT from 02/04/2024 in [facility]  Referring Provider Gaylyn Keas, MD    Initial Encounter Date:  Flowsheet Row INTENSIVE CARDIAC REHAB ORIENT from 02/04/2024 in [facility]  Date 02/04/24    Visit Diagnosis: 12/06/23 S/P CABG x 2  12/06/23 S/P AVR (aortic valve replacement)  Patient's Home Medications on Admission:  Current Outpatient Medications:    amLODipine  (NORVASC ) 5 MG tablet, Take 1 tablet (5 mg total) by mouth daily., Disp: 90 tablet, Rfl: 3   aspirin  EC 81 MG tablet, Take 1 tablet (81 mg total) by mouth daily. Swallow whole., Disp: , Rfl:    ezetimibe  (ZETIA ) 10 MG tablet, Take 1 tablet (10 mg total) by mouth daily., Disp: 90 tablet, Rfl: 3   levothyroxine  (SYNTHROID ) 50 MCG tablet, Take 1 tablet (50 mcg total) by mouth daily., Disp: 90 tablet, Rfl: 0   losartan  (COZAAR ) 25 MG tablet, Take 1 tablet (25 mg total) by mouth daily., Disp: 90 tablet, Rfl: 3   Multiple Vitamin (MULTIVITAMIN WITH MINERALS) TABS tablet, Take 1 tablet by mouth in the morning. Centrum Silver, Disp: , Rfl:    rosuvastatin  (CRESTOR ) 40 MG tablet, Take 1 tablet (40 mg total) by mouth daily., Disp: 90 tablet, Rfl: 3  Past Medical History: Past Medical History:  Diagnosis Date   Aortic stenosis    s/p  23 mm Inspiris valve present in the aortic position.  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 12/2023   Back pain    Coronary artery disease    Diverticulosis    Heart murmur    History of colon polyps    Hyperlipidemia    on meds   Hypertension    on meds   Hypothyroidism    Moderate  aortic stenosis    Pre-diabetes    Thyroid  disease    on meds    Tobacco Use: Social History   Tobacco Use  Smoking Status Never  Smokeless Tobacco Never    Labs: Review Flowsheet  More data exists      Latest Ref Rng & Units 12/04/2023 12/06/2023 12/20/2023 03/25/2024 03/30/2024  Labs for ITP Cardiac and Pulmonary Rehab  Cholestrol 100 - 199 mg/dL - - - 409  -  LDL (calc) 0 - 99 mg/dL - - - 74  -  HDL-C >81 mg/dL - - - 51  -  Trlycerides 0 - 149 mg/dL - - - 191  -  Hemoglobin A1c 4.6 - 6.5 % 5.7  - - - 5.9   PH, Arterial 7.35 - 7.45 - 7.391  7.367  7.375  7.331  7.340  7.382  7.349  7.385  7.445  7.383  - -  PCO2 arterial 32 - 48 mmHg - 36.6  35.7  39.3  44.0  45.9  41.4  41.7  41.3  28.3  34.9  - -  Bicarbonate 20.0 - 28.0 mmol/L - 22.3  20.6  23.2  23.2  24.8  24.6  25.7  23.0  24.7  19.5  20.8  - -  TCO2 22 - 32 mmol/L - 23  22  24  25  23  26  24  26  25  27  24  24  26  26  20  22  22   - -  Acid-base deficit 0.0 - 2.0 mmol/L - 2.0  4.0  2.0  3.0  1.0  2.0  4.0  4.0  - -  O2 Saturation % - 96  96  92  97  100  100  74  100  100  95  100  - -    Details       Multiple values from one day are sorted in reverse-chronological order         Capillary Blood Glucose: Lab Results  Component Value Date   GLUCAP 101 (H) 12/24/2023   GLUCAP 129 (H) 12/24/2023   GLUCAP 115 (H) 12/23/2023   GLUCAP 153 (H) 12/23/2023   GLUCAP 135 (H) 12/23/2023     Exercise Target Goals: Exercise Program Goal: Individual exercise prescription set using results from initial 6 min walk test and THRR while considering  patient's activity barriers and safety.   Exercise Prescription Goal: Initial exercise prescription builds to 30-45 minutes a day of aerobic activity, 2-3 days per week.  Home exercise guidelines will be given to patient during program as part of exercise prescription that the participant will acknowledge.  Activity Barriers & Risk Stratification:  Activity Barriers & Cardiac  Risk Stratification - 02/04/24 1109       Activity Barriers & Cardiac Risk Stratification   Activity Barriers Arthritis;Joint Problems;Back Problems;Shortness of Breath;Balance Concerns    Cardiac Risk Stratification High          6 Minute Walk:  6 Minute Walk     Row Name 02/04/24 1314         6 Minute Walk   Phase Initial     Distance 1590 feet     Walk Time 6 minutes     # of Rest Breaks 0     MPH 3.01     METS 3.34     RPE 11     Perceived Dyspnea  1     VO2 Peak 11.7     Symptoms Yes (comment)     Comments Mild SOB, RPD = 1     Resting HR 84 bpm     Resting BP 130/62     Resting Oxygen Saturation  98 %     Exercise Oxygen Saturation  during 6 min walk 99 %     Max Ex. HR 117 bpm     Max Ex. BP 160/78     2 Minute Post BP 140/76        Oxygen Initial Assessment:   Oxygen Re-Evaluation:   Oxygen Discharge (Final Oxygen Re-Evaluation):   Initial Exercise Prescription:  Initial Exercise Prescription - 02/04/24 1300       Date of Initial Exercise RX and Referring Provider   Date 02/04/24    Referring Provider Gaylyn Keas, MD    Expected Discharge Date 04/29/24      Recumbant Bike   Level 2    RPM 60    Watts 41    Minutes 15    METs 3.3      NuStep   Level 2    SPM 80    Minutes 15    METs 3.3      Prescription Details   Frequency (times per week) 3    Duration Progress to 30 minutes of continuous  aerobic without signs/symptoms of physical distress      Intensity   THRR 40-80% of Max Heartrate 58-116    Ratings of Perceived Exertion 11-13    Perceived Dyspnea 0-4      Progression   Progression Continue progressive overload as per policy without signs/symptoms or physical distress.      Resistance Training   Training Prescription Yes    Weight 4    Reps 10-15          Perform Capillary Blood Glucose checks as needed.  Exercise Prescription Changes:   Exercise Prescription Changes     Row Name 02/10/24 1600 02/21/24 1400  03/09/24 1400 03/20/24 1500 04/08/24 1100     Response to Exercise   Blood Pressure (Admit) 130/72 160/90 142/64 142/64 138/68   Blood Pressure (Exercise) 150/60 136/62 -- --  over 10 sessions --   Blood Pressure (Exit) 138/70 130/80 116/70 118/62 120/64   Heart Rate (Admit) 69 bpm 77 bpm 62 bpm 73 bpm 91 bpm   Heart Rate (Exercise) 108 bpm 123 bpm 103 bpm 124 bpm 132 bpm   Heart Rate (Exit) 85 bpm 89 bpm 76 bpm 88 bpm 103 bpm   Rating of Perceived Exertion (Exercise) 11 11 11 10 10    Perceived Dyspnea (Exercise) -- -- -- 0 --   Symptoms None None None none None   Comments Pt's first day in the CRP2 program Reviewed METs Reviewed METs, goals and HERx Reviewed METs Reviewed METs and goals   Duration Continue with 30 min of aerobic exercise without signs/symptoms of physical distress. Continue with 30 min of aerobic exercise without signs/symptoms of physical distress. Continue with 30 min of aerobic exercise without signs/symptoms of physical distress. Continue with 30 min of aerobic exercise without signs/symptoms of physical distress. Continue with 30 min of aerobic exercise without signs/symptoms of physical distress.   Intensity THRR unchanged THRR unchanged THRR unchanged THRR unchanged THRR unchanged     Progression   Progression Continue to progress workloads to maintain intensity without signs/symptoms of physical distress. Continue to progress workloads to maintain intensity without signs/symptoms of physical distress. Continue to progress workloads to maintain intensity without signs/symptoms of physical distress. Continue to progress workloads to maintain intensity without signs/symptoms of physical distress. Continue to progress workloads to maintain intensity without signs/symptoms of physical distress.   Average METs 2.65 3.75 3.8 3.8 3.65     Resistance Training   Training Prescription Yes Yes Yes Yes No   Weight 4 lbs 4 lbs 4 lbs 4 lbs --   Reps 10-15 10-15 10-15 10-15 --    Time 10 Minutes 10 Minutes 10 Minutes 10 Minutes --     Interval Training   Interval Training No No No No No     Recumbant Bike   Level 2 3 3 3 3    RPM 83 83 85 --  not recorded 75   Watts 28 46 51 --  not recorded 64   Minutes 15 15 15 15 15    METs 2.5 4.3 3.8 --  not recorded 4     NuStep   Level 2 3 3 4 4    SPM 110 116 -- 125 123   Minutes 15 15 15 15 15    METs 2.8 3.2 -- 3.8 3.3     Home Exercise Plan   Plans to continue exercise at -- -- Home (comment) -- Home (comment)   Frequency -- -- Add 2 additional days to program exercise sessions. --  Add 2 additional days to program exercise sessions.   Initial Home Exercises Provided -- -- 03/09/24 -- 03/09/24      Exercise Comments:   Exercise Comments     Row Name 02/10/24 1638 02/21/24 1432 03/09/24 0800 03/20/24 1549 04/08/24 1124   Exercise Comments Pt's first day in the CRP2 program. Pt exercise with no complaints and is off to a good start. Reviewed METs today. Pt is making good progress on his MET levels. No changes to exercise Rx today. Reviewed METs, goals and home Exercise Rx. Pt walks his dog but would not consider it aerobic. Encouraged to add 30 minutes of walking 2x/week (without the dog) to reach the 150 minute exercise goal. Pt verbalized unerstnading of home exericse Rx and was provided a copy. Reviewed METs with pt. Pt continues to tolerate exercise well without s/sx. He has been progressing himself on the nustep feeling good about his progress. Overall pt is doing well, will ocntinue to progress workloads as tolerated. Reviewed METs and goals with patient.  Contunies to make progress.  Will incrreased workload on Nsutep next visit.      Exercise Goals and Review:   Exercise Goals     Row Name 02/04/24 1108             Exercise Goals   Increase Physical Activity Yes       Intervention Provide advice, education, support and counseling about physical activity/exercise needs.;Develop an individualized  exercise prescription for aerobic and resistive training based on initial evaluation findings, risk stratification, comorbidities and participant's personal goals.       Expected Outcomes Short Term: Attend rehab on a regular basis to increase amount of physical activity.;Long Term: Exercising regularly at least 3-5 days a week.;Long Term: Add in home exercise to make exercise part of routine and to increase amount of physical activity.       Increase Strength and Stamina Yes       Intervention Provide advice, education, support and counseling about physical activity/exercise needs.;Develop an individualized exercise prescription for aerobic and resistive training based on initial evaluation findings, risk stratification, comorbidities and participant's personal goals.       Expected Outcomes Short Term: Increase workloads from initial exercise prescription for resistance, speed, and METs.;Short Term: Perform resistance training exercises routinely during rehab and add in resistance training at home;Long Term: Improve cardiorespiratory fitness, muscular endurance and strength as measured by increased METs and functional capacity ( )       Able to understand and use rate of perceived exertion (RPE) scale Yes       Intervention Provide education and explanation on how to use RPE scale       Expected Outcomes Short Term: Able to use RPE daily in rehab to express subjective intensity level;Long Term:  Able to use RPE to guide intensity level when exercising independently       Knowledge and understanding of Target Heart Rate Range (THRR) Yes       Intervention Provide education and explanation of THRR including how the numbers were predicted and where they are located for reference       Expected Outcomes Short Term: Able to state/look up THRR;Long Term: Able to use THRR to govern intensity when exercising independently;Short Term: Able to use daily as guideline for intensity in rehab       Understanding  of Exercise Prescription Yes       Intervention Provide education, explanation, and written materials on patient's individual exercise prescription  Expected Outcomes Short Term: Able to explain program exercise prescription;Long Term: Able to explain home exercise prescription to exercise independently          Exercise Goals Re-Evaluation :  Exercise Goals Re-Evaluation     Row Name 02/10/24 1637 03/09/24 0800 04/08/24 1121         Exercise Goal Re-Evaluation   Exercise Goals Review Increase Physical Activity;Understanding of Exercise Prescription;Increase Strength and Stamina;Knowledge and understanding of Target Heart Rate Range (THRR);Able to understand and use rate of perceived exertion (RPE) scale Increase Physical Activity;Understanding of Exercise Prescription;Increase Strength and Stamina;Knowledge and understanding of Target Heart Rate Range (THRR);Able to understand and use rate of perceived exertion (RPE) scale Increase Physical Activity;Understanding of Exercise Prescription;Increase Strength and Stamina;Knowledge and understanding of Target Heart Rate Range (THRR);Able to understand and use rate of perceived exertion (RPE) scale     Comments Pt's first day in the CRP2 program. Pt understands the exercise Rx, RPE scale and THRR. Reviewed METs, goals and HERx with patient today. Pt voices improvement in his goals of improved energy and less SOB. Pt notices his breathing is getting better. Pt has not made much progress on his goal of weight loss, but will have dietician speak with him. Reviewed METs, goals and HERx with patient today. Pt continues to voice improvement in his goal of improved energy. He voices his SOB has gotten much better. Pt still has not made much progress on his goal of weight loss, but will have dietician speak with him again. Pt voices I'm feeling great.     Expected Outcomes will continue to monitor patient and progress exercise workloads as tolerated. will  continue to monitor patient and progress exercise workloads as tolerated. Will continue to monitor patient and progress exercise workloads as tolerated.        Discharge Exercise Prescription (Final Exercise Prescription Changes):  Exercise Prescription Changes - 04/08/24 1100       Response to Exercise   Blood Pressure (Admit) 138/68    Blood Pressure (Exit) 120/64    Heart Rate (Admit) 91 bpm    Heart Rate (Exercise) 132 bpm    Heart Rate (Exit) 103 bpm    Rating of Perceived Exertion (Exercise) 10    Symptoms None    Comments Reviewed METs and goals    Duration Continue with 30 min of aerobic exercise without signs/symptoms of physical distress.    Intensity THRR unchanged      Progression   Progression Continue to progress workloads to maintain intensity without signs/symptoms of physical distress.    Average METs 3.65      Resistance Training   Training Prescription No      Interval Training   Interval Training No      Recumbant Bike   Level 3    RPM 75    Watts 64    Minutes 15    METs 4      NuStep   Level 4    SPM 123    Minutes 15    METs 3.3      Home Exercise Plan   Plans to continue exercise at Home (comment)    Frequency Add 2 additional days to program exercise sessions.    Initial Home Exercises Provided 03/09/24          Nutrition:  Target Goals: Understanding of nutrition guidelines, daily intake of sodium 1500mg , cholesterol 200mg , calories 30% from fat and 7% or less from saturated fats, daily to have 5  or more servings of fruits and vegetables.  Biometrics:  Pre Biometrics - 02/04/24 1040       Pre Biometrics   Waist Circumference 44 inches    Hip Circumference 43.75 inches    Waist to Hip Ratio 1.01 %    Triceps Skinfold 12 mm    % Body Fat 30 %    Grip Strength 22 kg    Flexibility 10.75 in    Single Leg Stand 6.25 seconds           Nutrition Therapy Plan and Nutrition Goals:  Nutrition Therapy & Goals - 04/10/24 1031        Nutrition Therapy   Diet Heart Healthy Diet    Drug/Food Interactions Statins/Certain Fruits      Personal Nutrition Goals   Nutrition Goal Patient to identify strategies for reducing cardiovascular risk by attending the [employer] education and nutrition series weekly.   goal in action   Personal Goal #2 Patient to improve diet quality by using the plate method as a guide for meal planning to include lean protein/plant protein, fruits, vegetables, whole grains, nonfat dairy as part of a well-balanced diet.   goal in progress.   Personal Goal #3 Patient to identify strategies for weight loss of 0.5-2.0# per week.   goal not met.   Comments Goals in progress. Patient has medical history of s/p AVR, CABG x2, hyperlipidemia, HTN, CAD, hypothyroidism. Hospitalization was complicated by renal insufficiency; BMP improved. A1c is in a prediabetic range. Lipids WNL and LDL  improved but remains slightly above goal. Patient is motivated to lose weight; have discussed strategies for weight loss including calorie density, the plate method as a guide for meal planning, label reading, etc. He is down 1.1# since starting with our program. Patient will benefit from participation in intensive cardiac rehab for nutrition, exercise, and lifestyle modification.      Intervention Plan   Intervention Prescribe, educate and counsel regarding individualized specific dietary modifications aiming towards targeted core components such as weight, hypertension, lipid management, diabetes, heart failure and other comorbidities.;Nutrition handout(s) given to patient.    Expected Outcomes Short Term Goal: Understand basic principles of dietary content, such as calories, fat, sodium, cholesterol and nutrients.;Long Term Goal: Adherence to prescribed nutrition plan.          Nutrition Assessments:  Nutrition Assessments - 02/18/24 1518       Rate Your Plate Scores   Pre Score 46         MEDIFICTS Score Key: >=70  Need to make dietary changes  40-70 Heart Healthy Diet <= 40 Therapeutic Level Cholesterol Diet   Flowsheet Row INTENSIVE CARDIAC REHAB from 02/17/2024 in [facility]  Picture Your Plate Total Score on Admission 46   Picture Your Plate Scores: <91 Unhealthy dietary pattern with much room for improvement. 41-50 Dietary pattern unlikely to meet recommendations for good health and room for improvement. 51-60 More healthful dietary pattern, with some room for improvement.  >60 Healthy dietary pattern, although there may be some specific behaviors that could be improved.    Nutrition Goals Re-Evaluation:  Nutrition Goals Re-Evaluation     Row Name 02/14/24 1059 03/12/24 1013 04/10/24 1031         Goals   Current Weight 213 lb 6.5 oz (96.8 kg) 211 lb 10.3 oz (96 kg) 211 lb 10.3 oz (96 kg)     Comment A1c 5.7, Lipids WNL, LDL 97 no  new labs; most recent labs  A1c 5.7, Lipids WNL, LDL 97 A1c 5.9, LDL 74, HDL 51     Expected Outcome Patient has medical history of s/p AVR, CABG x2, hyperlipidemia, HTN, CAD, hypothyroidism. Hospitalization was complicated by renal insufficiency; BMP improving, K+ 5.1. A1c is in a prediabetic range. Lipids WNL but LDL remains above goal. Patient is motivated to lose weight; will continue to address strategies for weight loss including calorie density, the plate method as a guide for meal planning, etc. Patient will benefit from participation in intensive cardiac rehab for nutrition, exercise, and lifestyle modification. Patient has medical history of s/p AVR, CABG x2, hyperlipidemia, HTN, CAD, hypothyroidism. Hospitalization was complicated by renal insufficiency; BMP improving, K+ 5.1. A1c is in a prediabetic range. Lipids WNL but LDL remains above goal. Patient is motivated to lose weight; have discussed strategies for weight loss including calorie density, the plate method as a guide for meal planning, etc. He  is down 1.1# since starting with our program. Patient will benefit from participation in intensive cardiac rehab for nutrition, exercise, and lifestyle modification. Goals in progress. Patient has medical history of s/p AVR, CABG x2, hyperlipidemia, HTN, CAD, hypothyroidism. Hospitalization was complicated by renal insufficiency; BMP improved. A1c is in a prediabetic range. Lipids WNL and LDL improved but remains slightly above goal. Patient is motivated to lose weight; have discussed strategies for weight loss including calorie density, the plate method as a guide for meal planning, label reading, etc. He is down 1.1# since starting with our program. Patient will benefit from participation in intensive cardiac rehab for nutrition, exercise, and lifestyle modification.        Nutrition Goals Re-Evaluation:  Nutrition Goals Re-Evaluation     Row Name 02/14/24 1059 03/12/24 1013 04/10/24 1031         Goals   Current Weight 213 lb 6.5 oz (96.8 kg) 211 lb 10.3 oz (96 kg) 211 lb 10.3 oz (96 kg)     Comment A1c 5.7, Lipids WNL, LDL 97 no new labs; most recent labs  A1c 5.7, Lipids WNL, LDL 97 A1c 5.9, LDL 74, HDL 51     Expected Outcome Patient has medical history of s/p AVR, CABG x2, hyperlipidemia, HTN, CAD, hypothyroidism. Hospitalization was complicated by renal insufficiency; BMP improving, K+ 5.1. A1c is in a prediabetic range. Lipids WNL but LDL remains above goal. Patient is motivated to lose weight; will continue to address strategies for weight loss including calorie density, the plate method as a guide for meal planning, etc. Patient will benefit from participation in intensive cardiac rehab for nutrition, exercise, and lifestyle modification. Patient has medical history of s/p AVR, CABG x2, hyperlipidemia, HTN, CAD, hypothyroidism. Hospitalization was complicated by renal insufficiency; BMP improving, K+ 5.1. A1c is in a prediabetic range. Lipids WNL but LDL remains above goal. Patient is  motivated to lose weight; have discussed strategies for weight loss including calorie density, the plate method as a guide for meal planning, etc. He is down 1.1# since starting with our program. Patient will benefit from participation in intensive cardiac rehab for nutrition, exercise, and lifestyle modification. Goals in progress. Patient has medical history of s/p AVR, CABG x2, hyperlipidemia, HTN, CAD, hypothyroidism. Hospitalization was complicated by renal insufficiency; BMP improved. A1c is in a prediabetic range. Lipids WNL and LDL improved but remains slightly above goal. Patient is motivated to lose weight; have discussed strategies for weight loss including calorie density, the plate method as a guide for meal planning,  label reading, etc. He is down 1.1# since starting with our program. Patient will benefit from participation in intensive cardiac rehab for nutrition, exercise, and lifestyle modification.        Nutrition Goals Discharge (Final Nutrition Goals Re-Evaluation):  Nutrition Goals Re-Evaluation - 04/10/24 1031       Goals   Current Weight 211 lb 10.3 oz (96 kg)    Comment A1c 5.9, LDL 74, HDL 51    Expected Outcome Goals in progress. Patient has medical history of s/p AVR, CABG x2, hyperlipidemia, HTN, CAD, hypothyroidism. Hospitalization was complicated by renal insufficiency; BMP improved. A1c is in a prediabetic range. Lipids WNL and LDL improved but remains slightly above goal. Patient is motivated to lose weight; have discussed strategies for weight loss including calorie density, the plate method as a guide for meal planning, label reading, etc. He is down 1.1# since starting with our program. Patient will benefit from participation in intensive cardiac rehab for nutrition, exercise, and lifestyle modification.          Psychosocial: Target Goals: Acknowledge presence or absence of significant depression and/or stress, maximize coping skills, provide positive support  system. Participant is able to verbalize types and ability to use techniques and skills needed for reducing stress and depression.  Initial Review & Psychosocial Screening:  Initial Psych Review & Screening - 02/04/24 1105       Initial Review   Current issues with None Identified      Family Dynamics   Good Support System? Yes   Pt has spouse for support     Barriers   Psychosocial barriers to participate in program There are no identifiable barriers or psychosocial needs.      Screening Interventions   Interventions Encouraged to exercise          Quality of Life Scores:  Quality of Life - 02/04/24 1321       Quality of Life   Select Quality of Life      Quality of Life Scores   Health/Function Pre 19.33 %    Socioeconomic Pre 20.36 %    Psych/Spiritual Pre 16.36 %    Family Pre 21.5 %    GLOBAL Pre 19.25 %         Scores of 19 and below usually indicate a poorer quality of life in these areas.  A difference of  2-3 points is a clinically meaningful difference.  A difference of 2-3 points in the total score of the Quality of Life Index has been associated with significant improvement in overall quality of life, self-image, physical symptoms, and general health in studies assessing change in quality of life.  PHQ-9: Review Flowsheet  More data exists      03/30/2024 02/04/2024 07/05/2023 05/27/2023 03/22/2023  Depression screen PHQ 2/9  Decreased Interest 0 0 0 0 0  Down, Depressed, Hopeless 0 0 0 0 0  PHQ - 2 Score 0 0 0 0 0  Altered sleeping - 0 0 0 1  Tired, decreased energy - 1 0 0 0  Change in appetite - 0 0 0 0  Feeling bad or failure about yourself  - 0 0 0 0  Trouble concentrating - 0 0 0 0  Moving slowly or fidgety/restless - 0 0 0 0  Suicidal thoughts - 0 0 0 0  PHQ-9 Score - 1 0 0 1  Difficult doing work/chores - Somewhat difficult Not difficult at all Not difficult at all Not difficult at all  Interpretation of Total Score  Total Score Depression  Severity:  1-4 = Minimal depression, 5-9 = Mild depression, 10-14 = Moderate depression, 15-19 = Moderately severe depression, 20-27 = Severe depression   Psychosocial Evaluation and Intervention:   Psychosocial Re-Evaluation:  Psychosocial Re-Evaluation     Row Name 02/13/24 0747 03/13/24 1535 04/14/24 1310         Psychosocial Re-Evaluation   Current issues with Current Stress Concerns Current Stress Concerns None Identified     Comments Dee Farber did not voice any increased concerns or stressors. Will review quality of life in the upcoming week Quality of life  and PHQ-9 reviewed.Dee Farber denies being depressed. Dee Farber says that his shortness of breath and energy level has improved since he has been participating in cardiac rehab. Dee Farber has not voiced any increased concerns or stressors during exercise at cardiac rehab.     Expected Outcomes Dee Farber will have decreased or controlled stressors upon completion of cardiac rehab Dee Farber will have decreased or controlled stressors upon completion of cardiac rehab Dee Farber will have decreased or controlled stressors upon completion of cardiac rehab     Interventions Stress management education;Encouraged to attend Cardiac Rehabilitation for the exercise;Relaxation education Stress management education;Encouraged to attend Cardiac Rehabilitation for the exercise;Relaxation education Stress management education;Encouraged to attend Cardiac Rehabilitation for the exercise;Relaxation education     Continue Psychosocial Services  Follow up required by staff Follow up required by staff No Follow up required       Initial Review   Source of Stress Concerns Chronic Illness Chronic Illness Chronic Illness     Comments Will continue to montior and offer support as needed Will continue to montior and offer support as needed Will continue to montior and offer support as needed        Psychosocial Discharge (Final Psychosocial Re-Evaluation):  Psychosocial Re-Evaluation -  04/14/24 1310       Psychosocial Re-Evaluation   Current issues with None Identified    Comments Dee Farber has not voiced any increased concerns or stressors during exercise at cardiac rehab.    Expected Outcomes Dee Farber will have decreased or controlled stressors upon completion of cardiac rehab    Interventions Stress management education;Encouraged to attend Cardiac Rehabilitation for the exercise;Relaxation education    Continue Psychosocial Services  No Follow up required      Initial Review   Source of Stress Concerns Chronic Illness    Comments Will continue to montior and offer support as needed          Vocational Rehabilitation: Provide vocational rehab assistance to qualifying candidates.   Vocational Rehab Evaluation & Intervention:  Vocational Rehab - 02/04/24 1106       Initial Vocational Rehab Evaluation & Intervention   Assessment shows need for Vocational Rehabilitation No   Pt is retired         Education: Education Goals: Education classes will be provided on a weekly basis, covering required topics. Participant will state understanding/return demonstration of topics presented.    Education     Row Name 02/10/24 0900     Education   Cardiac Education Topics Pritikin   Select Workshops     Workshops   Educator Exercise Physiologist   Select Psychosocial   Psychosocial Workshop Healthy Sleep for a Healthy Heart   Instruction Review Code 1- Verbalizes Understanding   Class Start Time [phone]   Class Stop Time 0858   Class Time Calculation (min) 46 min    Row Name 02/12/24 0900  Education   Cardiac Education Topics Pritikin   Orthoptist   Educator Dietitian   Weekly Topic Simple Sides and Sauces   Instruction Review Code 1- Verbalizes Understanding   Class Start Time 0815   Class Stop Time 0855   Class Time Calculation (min) 40 min    Row Name 02/14/24 0800     Education   Cardiac Education Topics Pritikin    Select Core Videos     Core Videos   Educator Exercise Physiologist   Select Psychosocial   Psychosocial How Our Thoughts Can Heal Our Hearts   Instruction Review Code 1- Verbalizes Understanding   Class Start Time [phone]   Class Stop Time 0848   Class Time Calculation (min) 36 min    Row Name 02/17/24 0800     Education   Cardiac Education Topics Pritikin   Select Workshops     Workshops   Educator Exercise Physiologist   Select Exercise   Exercise Workshop Managing Heart Disease: Your Path to a Healthier Heart   Instruction Review Code 1- Verbalizes Understanding   Class Start Time [phone]   Class Stop Time 0900   Class Time Calculation (min) 48 min    Row Name 02/19/24 0900     Education   Cardiac Education Topics Pritikin   Secondary school teacher School   Educator Dietitian   Weekly Topic Powerhouse Plant-Based Proteins   Instruction Review Code 1- Verbalizes Understanding   Class Start Time 0815   Class Stop Time 0855   Class Time Calculation (min) 40 min    Row Name 02/21/24 1300     Education   Cardiac Education Topics Pritikin   Psychologist, forensic General Education   General Education Hypertension and Heart Disease   Instruction Review Code 1- Verbalizes Understanding   Class Start Time [phone]   Class Stop Time 0847   Class Time Calculation (min) 35 min    Row Name 02/24/24 0700     Education   Cardiac Education Topics Pritikin   Geographical information systems officer Psychosocial   Psychosocial Workshop From Head to Heart: The Power of a Healthy Outlook   Instruction Review Code 1- Verbalizes Understanding   Class Start Time 0815   Class Stop Time 0900   Class Time Calculation (min) 45 min    Row Name 02/26/24 0800     Education   Cardiac Education Topics Pritikin   Secondary school teacher School   Educator Dietitian   Weekly  Topic Tasty Appetizers and Snacks   Instruction Review Code 1- Verbalizes Understanding   Class Start Time 0815   Class Stop Time 0855   Class Time Calculation (min) 40 min    Row Name 02/28/24 0800     Education   Cardiac Education Topics Pritikin   Select Core Videos     Core Videos   Educator Exercise Physiologist   Select General Education   General Education Heart Disease Risk Reduction   Instruction Review Code 1- Verbalizes Understanding   Class Start Time 0813   Class Stop Time 0850   Class Time Calculation (min) 37 min    Row Name 03/02/24 0900     Education   Cardiac Education Topics Pritikin   Select Core  Videos     Core Videos   Educator Exercise Physiologist   Select Psychosocial   Psychosocial Healthy Minds, Bodies, Hearts   Instruction Review Code 1- Verbalizes Understanding   Class Start Time 0815   Class Stop Time 0850   Class Time Calculation (min) 35 min    Row Name 03/04/24 0900     Education   Cardiac Education Topics Pritikin   Secondary school teacher School   Educator Dietitian   Weekly Topic Adding Flavor - Sodium-Free   Instruction Review Code 1- Verbalizes Understanding   Class Start Time 0815   Class Stop Time 0855   Class Time Calculation (min) 40 min    Row Name 03/06/24 0800     Education   Cardiac Education Topics Pritikin   Select Workshops     Workshops   Educator Exercise Physiologist   Select Exercise   Exercise Workshop Location manager and Fall Prevention   Instruction Review Code 1- Verbalizes Understanding   Class Start Time 0810   Class Stop Time 0850   Class Time Calculation (min) 40 min    Row Name 03/09/24 0900     Education   Cardiac Education Topics Pritikin   Glass blower/designer Nutrition   Nutrition Workshop Label Reading   Instruction Review Code 1- Verbalizes Understanding   Class Start Time 0815   Class Stop Time 0900   Class Time  Calculation (min) 45 min    Row Name 03/11/24 0800     Education   Cardiac Education Topics Pritikin   Select Core Videos     Core Videos   Educator Exercise Physiologist   Select Nutrition   Nutrition Other  Label reading   Instruction Review Code 1- Verbalizes Understanding   Class Start Time 0813   Class Stop Time 0844   Class Time Calculation (min) 31 min    Row Name 03/13/24 0700     Education   Cardiac Education Topics Pritikin   Orthoptist   Educator Dietitian   Weekly Topic Fast and Healthy Breakfasts   Instruction Review Code 1- Verbalizes Understanding   Class Start Time 0815   Class Stop Time 0855   Class Time Calculation (min) 40 min    Row Name 03/16/24 0900     Education   Cardiac Education Topics Pritikin   Training and development officer General Education   General Education Metabolic Syndrome and Belly Fat   Instruction Review Code 1- Verbalizes Understanding   Class Start Time 0815   Class Stop Time 0851   Class Time Calculation (min) 36 min    Row Name 03/18/24 1000     Education   Cardiac Education Topics Pritikin   Secondary school teacher School   Educator Dietitian   Weekly Topic Personalizing Your Pritikin Plate   Instruction Review Code 1- Verbalizes Understanding   Class Start Time 0815   Class Stop Time 0850   Class Time Calculation (min) 35 min    Row Name 03/20/24 0900     Education   Cardiac Education Topics Pritikin   Select Core Videos     Core Videos   Educator Dietitian   Select Nutrition   Nutrition Overview of the Pritikin Eating Plan   Instruction Review Code 1- Verbalizes Understanding  Class Start Time 0815   Class Stop Time 0900   Class Time Calculation (min) 45 min    Row Name 03/25/24 0800     Education   Cardiac Education Topics Pritikin   Secondary school teacher School   Educator Dietitian   Weekly Topic [employer] Desserts    Instruction Review Code 1- Verbalizes Understanding   Class Start Time 0815   Class Stop Time 0855   Class Time Calculation (min) 40 min    Row Name 04/01/24 0900     Education   Cardiac Education Topics Pritikin   Secondary school teacher School   Educator Dietitian   Weekly Topic Efficiency Cooking - Meals in a Snap   Instruction Review Code 1- Verbalizes Understanding   Class Start Time 0815   Class Stop Time 0850   Class Time Calculation (min) 35 min    Row Name 04/03/24 0900     Education   Cardiac Education Topics Pritikin   Psychologist, forensic Exercise Education   Exercise Education Move It!   Instruction Review Code 1- Verbalizes Understanding   Class Start Time [phone]   Class Stop Time 0845   Class Time Calculation (min) 35 min    Row Name 04/06/24 0800     Education   Cardiac Education Topics Pritikin   Glass blower/designer Nutrition   Nutrition Workshop Targeting Your Nutrition Priorities   Instruction Review Code 1- Verbalizes Understanding   Class Start Time 0815   Class Stop Time 0855   Class Time Calculation (min) 40 min    Row Name 04/08/24 0800     Education   Cardiac Education Topics Pritikin   Secondary school teacher School   Educator Dietitian   Weekly Topic One-Pot Wonders   Instruction Review Code 1- Verbalizes Understanding   Class Start Time 0815   Class Stop Time 0850   Class Time Calculation (min) 35 min    Row Name 04/10/24 0800     Education   Cardiac Education Topics Pritikin   Select Core Videos     Core Videos   Educator Exercise Physiologist   Select General Education   General Education Hypertension and Heart Disease   Instruction Review Code 1- Verbalizes Understanding   Class Start Time [phone]   Class Stop Time 0847   Class Time Calculation (min) 35 min    Row Name 04/13/24 0800     Education    Cardiac Education Topics Pritikin   Select Workshops     Workshops   Educator Exercise Physiologist   Select Psychosocial   Psychosocial Workshop Focused Goals, Sustainable Changes   Instruction Review Code 1- Verbalizes Understanding   Class Start Time 0815   Class Stop Time 0850   Class Time Calculation (min) 35 min      Core Videos: Exercise    Move It!  Clinical staff conducted group or individual video education with verbal and written material and guidebook.  Patient learns the recommended Pritikin exercise program. Exercise with the goal of living a long, healthy life. Some of the health benefits of exercise include controlled diabetes, healthier blood pressure levels, improved cholesterol levels, improved heart and lung capacity, improved sleep, and better body composition. Everyone should speak with their doctor before starting  or changing an exercise routine.  Biomechanical Limitations Clinical staff conducted group or individual video education with verbal and written material and guidebook.  Patient learns how biomechanical limitations can impact exercise and how we can mitigate and possibly overcome limitations to have an impactful and balanced exercise routine.  Body Composition Clinical staff conducted group or individual video education with verbal and written material and guidebook.  Patient learns that body composition (ratio of muscle mass to fat mass) is a key component to assessing overall fitness, rather than body weight alone. Increased fat mass, especially visceral belly fat, can put us  at increased risk for metabolic syndrome, type 2 diabetes, heart disease, and even death. It is recommended to combine diet and exercise (cardiovascular and resistance training) to improve your body composition. Seek guidance from your physician and exercise physiologist before implementing an exercise routine.  Exercise Action Plan Clinical staff conducted group or individual  video education with verbal and written material and guidebook.  Patient learns the recommended strategies to achieve and enjoy long-term exercise adherence, including variety, self-motivation, self-efficacy, and positive decision making. Benefits of exercise include fitness, good health, weight management, more energy, better sleep, less stress, and overall well-being.  Medical   Heart Disease Risk Reduction Clinical staff conducted group or individual video education with verbal and written material and guidebook.  Patient learns our heart is our most vital organ as it circulates oxygen, nutrients, white blood cells, and hormones throughout the entire body, and carries waste away. Data supports a plant-based eating plan like the Pritikin Program for its effectiveness in slowing progression of and reversing heart disease. The video provides a number of recommendations to address heart disease.   Metabolic Syndrome and Belly Fat  Clinical staff conducted group or individual video education with verbal and written material and guidebook.  Patient learns what metabolic syndrome is, how it leads to heart disease, and how one can reverse it and keep it from coming back. You have metabolic syndrome if you have 3 of the following 5 criteria: abdominal obesity, high blood pressure, high triglycerides, low HDL cholesterol, and high blood sugar.  Hypertension and Heart Disease Clinical staff conducted group or individual video education with verbal and written material and guidebook.  Patient learns that high blood pressure, or hypertension, is very common in the [place] . Hypertension is largely due to excessive salt intake, but other important risk factors include being overweight, physical inactivity, drinking too much alcohol, smoking, and not eating enough potassium from fruits and vegetables. High blood pressure is a leading risk factor for heart attack, stroke, congestive heart failure, dementia,  kidney failure, and premature death. Long-term effects of excessive salt intake include stiffening of the arteries and thickening of heart muscle and organ damage. Recommendations include ways to reduce hypertension and the risk of heart disease.  Diseases of Our Time - Focusing on Diabetes Clinical staff conducted group or individual video education with verbal and written material and guidebook.  Patient learns why the best way to stop diseases of our time is prevention, through food and other lifestyle changes. Medicine (such as prescription pills and surgeries) is often only a Band-Aid on the problem, not a long-term solution. Most common diseases of our time include obesity, type 2 diabetes, hypertension, heart disease, and cancer. The Pritikin Program is recommended and has been proven to help reduce, reverse, and/or prevent the damaging effects of metabolic syndrome.  Nutrition   Overview of the Pritikin Eating Plan  Clinical staff conducted  group or individual video education with verbal and written material and guidebook.  Patient learns about the Pritikin Eating Plan for disease risk reduction. The Pritikin Eating Plan emphasizes a wide variety of unrefined, minimally-processed carbohydrates, like fruits, vegetables, whole grains, and legumes. Go, Caution, and Stop food choices are explained. Plant-based and lean animal proteins are emphasized. Rationale provided for low sodium intake for blood pressure control, low added sugars for blood sugar stabilization, and low added fats and oils for coronary artery disease risk reduction and weight management.  Calorie Density  Clinical staff conducted group or individual video education with verbal and written material and guidebook.  Patient learns about calorie density and how it impacts the Pritikin Eating Plan. Knowing the characteristics of the food you choose will help you decide whether those foods will lead to weight gain or weight loss, and  whether you want to consume more or less of them. Weight loss is usually a side effect of the Pritikin Eating Plan because of its focus on low calorie-dense foods.  Label Reading  Clinical staff conducted group or individual video education with verbal and written material and guidebook.  Patient learns about the Pritikin recommended label reading guidelines and corresponding recommendations regarding calorie density, added sugars, sodium content, and whole grains.  Dining Out - Part 1  Clinical staff conducted group or individual video education with verbal and written material and guidebook.  Patient learns that restaurant meals can be sabotaging because they can be so high in calories, fat, sodium, and/or sugar. Patient learns recommended strategies on how to positively address this and avoid unhealthy pitfalls.  Facts on Fats  Clinical staff conducted group or individual video education with verbal and written material and guidebook.  Patient learns that lifestyle modifications can be just as effective, if not more so, as many medications for lowering your risk of heart disease. A Pritikin lifestyle can help to reduce your risk of inflammation and atherosclerosis (cholesterol build-up, or plaque, in the artery walls). Lifestyle interventions such as dietary choices and physical activity address the cause of atherosclerosis. A review of the types of fats and their impact on blood cholesterol levels, along with dietary recommendations to reduce fat intake is also included.  Nutrition Action Plan  Clinical staff conducted group or individual video education with verbal and written material and guidebook.  Patient learns how to incorporate Pritikin recommendations into their lifestyle. Recommendations include planning and keeping personal health goals in mind as an important part of their success.  Healthy Mind-Set    Healthy Minds, Bodies, Hearts  Clinical staff conducted group or individual  video education with verbal and written material and guidebook.  Patient learns how to identify when they are stressed. Video will discuss the impact of that stress, as well as the many benefits of stress management. Patient will also be introduced to stress management techniques. The way we think, act, and feel has an impact on our hearts.  How Our Thoughts Can Heal Our Hearts  Clinical staff conducted group or individual video education with verbal and written material and guidebook.  Patient learns that negative thoughts can cause depression and anxiety. This can result in negative lifestyle behavior and serious health problems. Cognitive behavioral therapy is an effective method to help control our thoughts in order to change and improve our emotional outlook.  Additional Videos:  Exercise    Improving Performance  Clinical staff conducted group or individual video education with verbal and written material and guidebook.  Patient learns to use a non-linear approach by alternating intensity levels and lengths of time spent exercising to help burn more calories and lose more body fat. Cardiovascular exercise helps improve heart health, metabolism, hormonal balance, blood sugar control, and recovery from fatigue. Resistance training improves strength, endurance, balance, coordination, reaction time, metabolism, and muscle mass. Flexibility exercise improves circulation, posture, and balance. Seek guidance from your physician and exercise physiologist before implementing an exercise routine and learn your capabilities and proper form for all exercise.  Introduction to Yoga  Clinical staff conducted group or individual video education with verbal and written material and guidebook.  Patient learns about yoga, a discipline of the coming together of mind, breath, and body. The benefits of yoga include improved flexibility, improved range of motion, better posture and core strength, increased lung  function, weight loss, and positive self-image. Yoga's heart health benefits include lowered blood pressure, healthier heart rate, decreased cholesterol and triglyceride levels, improved immune function, and reduced stress. Seek guidance from your physician and exercise physiologist before implementing an exercise routine and learn your capabilities and proper form for all exercise.  Medical   Aging: Enhancing Your Quality of Life  Clinical staff conducted group or individual video education with verbal and written material and guidebook.  Patient learns key strategies and recommendations to stay in good physical health and enhance quality of life, such as prevention strategies, having an advocate, securing a Health Care Proxy and Power of Attorney, and keeping a list of medications and system for tracking them. It also discusses how to avoid risk for bone loss.  Biology of Weight Control  Clinical staff conducted group or individual video education with verbal and written material and guidebook.  Patient learns that weight gain occurs because we consume more calories than we burn (eating more, moving less). Even if your body weight is normal, you may have higher ratios of fat compared to muscle mass. Too much body fat puts you at increased risk for cardiovascular disease, heart attack, stroke, type 2 diabetes, and obesity-related cancers. In addition to exercise, following the Pritikin Eating Plan can help reduce your risk.  Decoding Lab Results  Clinical staff conducted group or individual video education with verbal and written material and guidebook.  Patient learns that lab test reflects one measurement whose values change over time and are influenced by many factors, including medication, stress, sleep, exercise, food, hydration, pre-existing medical conditions, and more. It is recommended to use the knowledge from this video to become more involved with your lab results and evaluate your numbers  to speak with your doctor.   Diseases of Our Time - Overview  Clinical staff conducted group or individual video education with verbal and written material and guidebook.  Patient learns that according to the CDC, 50% to 70% of chronic diseases (such as obesity, type 2 diabetes, elevated lipids, hypertension, and heart disease) are avoidable through lifestyle improvements including healthier food choices, listening to satiety cues, and increased physical activity.  Sleep Disorders Clinical staff conducted group or individual video education with verbal and written material and guidebook.  Patient learns how good quality and duration of sleep are important to overall health and well-being. Patient also learns about sleep disorders and how they impact health along with recommendations to address them, including discussing with a physician.  Nutrition  Dining Out - Part 2 Clinical staff conducted group or individual video education with verbal and written material and guidebook.  Patient learns how to plan ahead  and communicate in order to maximize their dining experience in a healthy and nutritious manner. Included are recommended food choices based on the type of restaurant the patient is visiting.   Fueling a Banker conducted group or individual video education with verbal and written material and guidebook.  There is a strong connection between our food choices and our health. Diseases like obesity and type 2 diabetes are very prevalent and are in large-part due to lifestyle choices. The Pritikin Eating Plan provides plenty of food and hunger-curbing satisfaction. It is easy to follow, affordable, and helps reduce health risks.  Menu Workshop  Clinical staff conducted group or individual video education with verbal and written material and guidebook.  Patient learns that restaurant meals can sabotage health goals because they are often packed with calories, fat, sodium,  and sugar. Recommendations include strategies to plan ahead and to communicate with the manager, chef, or server to help order a healthier meal.  Planning Your Eating Strategy  Clinical staff conducted group or individual video education with verbal and written material and guidebook.  Patient learns about the Pritikin Eating Plan and its benefit of reducing the risk of disease. The Pritikin Eating Plan does not focus on calories. Instead, it emphasizes high-quality, nutrient-rich foods. By knowing the characteristics of the foods, we choose, we can determine their calorie density and make informed decisions.  Targeting Your Nutrition Priorities  Clinical staff conducted group or individual video education with verbal and written material and guidebook.  Patient learns that lifestyle habits have a tremendous impact on disease risk and progression. This video provides eating and physical activity recommendations based on your personal health goals, such as reducing LDL cholesterol, losing weight, preventing or controlling type 2 diabetes, and reducing high blood pressure.  Vitamins and Minerals  Clinical staff conducted group or individual video education with verbal and written material and guidebook.  Patient learns different ways to obtain key vitamins and minerals, including through a recommended healthy diet. It is important to discuss all supplements you take with your doctor.   Healthy Mind-Set    Smoking Cessation  Clinical staff conducted group or individual video education with verbal and written material and guidebook.  Patient learns that cigarette smoking and tobacco addiction pose a serious health risk which affects millions of people. Stopping smoking will significantly reduce the risk of heart disease, lung disease, and many forms of cancer. Recommended strategies for quitting are covered, including working with your doctor to develop a successful plan.  Culinary   Becoming a  Set designer conducted group or individual video education with verbal and written material and guidebook.  Patient learns that cooking at home can be healthy, cost-effective, quick, and puts them in control. Keys to cooking healthy recipes will include looking at your recipe, assessing your equipment needs, planning ahead, making it simple, choosing cost-effective seasonal ingredients, and limiting the use of added fats, salts, and sugars.  Cooking - Breakfast and Snacks  Clinical staff conducted group or individual video education with verbal and written material and guidebook.  Patient learns how important breakfast is to satiety and nutrition through the entire day. Recommendations include key foods to eat during breakfast to help stabilize blood sugar levels and to prevent overeating at meals later in the day. Planning ahead is also a key component.  Cooking - Educational psychologist conducted group or individual video education with verbal and written material and guidebook.  Patient learns eating strategies to improve overall health, including an approach to cook more at home. Recommendations include thinking of animal protein as a side on your plate rather than center stage and focusing instead on lower calorie dense options like vegetables, fruits, whole grains, and plant-based proteins, such as beans. Making sauces in large quantities to freeze for later and leaving the skin on your vegetables are also recommended to maximize your experience.  Cooking - Healthy Salads and Dressing Clinical staff conducted group or individual video education with verbal and written material and guidebook.  Patient learns that vegetables, fruits, whole grains, and legumes are the foundations of the Pritikin Eating Plan. Recommendations include how to incorporate each of these in flavorful and healthy salads, and how to create homemade salad dressings. Proper handling of ingredients is  also covered. Cooking - Soups and [employer] - Soups and Desserts Clinical staff conducted group or individual video education with verbal and written material and guidebook.  Patient learns that Pritikin soups and desserts make for easy, nutritious, and delicious snacks and meal components that are low in sodium, fat, sugar, and calorie density, while high in vitamins, minerals, and filling fiber. Recommendations include simple and healthy ideas for soups and desserts.   Overview     The Pritikin Solution Program Overview Clinical staff conducted group or individual video education with verbal and written material and guidebook.  Patient learns that the results of the Pritikin Program have been documented in more than 100 articles published in peer-reviewed journals, and the benefits include reducing risk factors for (and, in some cases, even reversing) high cholesterol, high blood pressure, type 2 diabetes, obesity, and more! An overview of the three key pillars of the Pritikin Program will be covered: eating well, doing regular exercise, and having a healthy mind-set.  WORKSHOPS  Exercise: Exercise Basics: Building Your Action Plan Clinical staff led group instruction and group discussion with PowerPoint presentation and patient guidebook. To enhance the learning environment the use of posters, models and videos may be added. At the conclusion of this workshop, patients will comprehend the difference between physical activity and exercise, as well as the benefits of incorporating both, into their routine. Patients will understand the FITT (Frequency, Intensity, Time, and Type) principle and how to use it to build an exercise action plan. In addition, safety concerns and other considerations for exercise and cardiac rehab will be addressed by the presenter. The purpose of this lesson is to promote a comprehensive and effective weekly exercise routine in order to improve patients' overall  level of fitness.   Managing Heart Disease: Your Path to a Healthier Heart Clinical staff led group instruction and group discussion with PowerPoint presentation and patient guidebook. To enhance the learning environment the use of posters, models and videos may be added.At the conclusion of this workshop, patients will understand the anatomy and physiology of the heart. Additionally, they will understand how Pritikin's three pillars impact the risk factors, the progression, and the management of heart disease.  The purpose of this lesson is to provide a high-level overview of the heart, heart disease, and how the Pritikin lifestyle positively impacts risk factors.  Exercise Biomechanics Clinical staff led group instruction and group discussion with PowerPoint presentation and patient guidebook. To enhance the learning environment the use of posters, models and videos may be added. Patients will learn how the structural parts of their bodies function and how these functions impact their daily activities, movement, and exercise. Patients will  learn how to promote a neutral spine, learn how to manage pain, and identify ways to improve their physical movement in order to promote healthy living. The purpose of this lesson is to expose patients to common physical limitations that impact physical activity. Participants will learn practical ways to adapt and manage aches and pains, and to minimize their effect on regular exercise. Patients will learn how to maintain good posture while sitting, walking, and lifting.  Balance Training and Fall Prevention  Clinical staff led group instruction and group discussion with PowerPoint presentation and patient guidebook. To enhance the learning environment the use of posters, models and videos may be added. At the conclusion of this workshop, patients will understand the importance of their sensorimotor skills (vision, proprioception, and the vestibular  system) in maintaining their ability to balance as they age. Patients will apply a variety of balancing exercises that are appropriate for their current level of function. Patients will understand the common causes for poor balance, possible solutions to these problems, and ways to modify their physical environment in order to minimize their fall risk. The purpose of this lesson is to teach patients about the importance of maintaining balance as they age and ways to minimize their risk of falling.  WORKSHOPS   Nutrition:  Fueling a Ship broker led group instruction and group discussion with PowerPoint presentation and patient guidebook. To enhance the learning environment the use of posters, models and videos may be added. Patients will review the foundational principles of the Pritikin Eating Plan and understand what constitutes a serving size in each of the food groups. Patients will also learn Pritikin-friendly foods that are better choices when away from home and review make-ahead meal and snack options. Calorie density will be reviewed and applied to three nutrition priorities: weight maintenance, weight loss, and weight gain. The purpose of this lesson is to reinforce (in a group setting) the key concepts around what patients are recommended to eat and how to apply these guidelines when away from home by planning and selecting Pritikin-friendly options. Patients will understand how calorie density may be adjusted for different weight management goals.  Mindful Eating  Clinical staff led group instruction and group discussion with PowerPoint presentation and patient guidebook. To enhance the learning environment the use of posters, models and videos may be added. Patients will briefly review the concepts of the Pritikin Eating Plan and the importance of low-calorie dense foods. The concept of mindful eating will be introduced as well as the importance of paying attention to internal  hunger signals. Triggers for non-hunger eating and techniques for dealing with triggers will be explored. The purpose of this lesson is to provide patients with the opportunity to review the basic principles of the Pritikin Eating Plan, discuss the value of eating mindfully and how to measure internal cues of hunger and fullness using the Hunger Scale. Patients will also discuss reasons for non-hunger eating and learn strategies to use for controlling emotional eating.  Targeting Your Nutrition Priorities Clinical staff led group instruction and group discussion with PowerPoint presentation and patient guidebook. To enhance the learning environment the use of posters, models and videos may be added. Patients will learn how to determine their genetic susceptibility to disease by reviewing their family history. Patients will gain insight into the importance of diet as part of an overall healthy lifestyle in mitigating the impact of genetics and other environmental insults. The purpose of this lesson is to provide patients with the opportunity  to assess their personal nutrition priorities by looking at their family history, their own health history and current risk factors. Patients will also be able to discuss ways of prioritizing and modifying the Pritikin Eating Plan for their highest risk areas  Menu  Clinical staff led group instruction and group discussion with PowerPoint presentation and patient guidebook. To enhance the learning environment the use of posters, models and videos may be added. Using menus brought in from [employer], or printed from [employer], patients will apply the Pritikin dining out guidelines that were presented in the [employer] video. Patients will also be able to practice these guidelines in a variety of provided scenarios. The purpose of this lesson is to provide patients with the opportunity to practice hands-on learning of the Pritikin Dining Out  guidelines with actual menus and practice scenarios.  Label Reading Clinical staff led group instruction and group discussion with PowerPoint presentation and patient guidebook. To enhance the learning environment the use of posters, models and videos may be added. Patients will review and discuss the Pritikin label reading guidelines presented in Pritikin's Label Reading Educational series video. Using fool labels brought in from local grocery stores and markets, patients will apply the label reading guidelines and determine if the packaged food meet the Pritikin guidelines. The purpose of this lesson is to provide patients with the opportunity to review, discuss, and practice hands-on learning of the Pritikin Label Reading guidelines with actual packaged food labels. Cooking School  Pritikin's [employer] are designed to teach patients ways to prepare quick, simple, and affordable recipes at home. The importance of nutrition's role in chronic disease risk reduction is reflected in its emphasis in the overall Pritikin program. By learning how to prepare essential core Pritikin Eating Plan recipes, patients will increase control over what they eat; be able to customize the flavor of foods without the use of added salt, sugar, or fat; and improve the quality of the food they consume. By learning a set of core recipes which are easily assembled, quickly prepared, and affordable, patients are more likely to prepare more healthy foods at home. These workshops focus on convenient breakfasts, simple entres, side dishes, and desserts which can be prepared with minimal effort and are consistent with nutrition recommendations for cardiovascular risk reduction. Cooking [employer] are taught by a Armed forces logistics/support/administrative officer (RD) who has been trained by the [employer]. The chef or RD has a clear understanding of the importance of minimizing - if not completely eliminating - added fat,  sugar, and sodium in recipes. Throughout the series of Cooking School Workshop sessions, patients will learn about healthy ingredients and efficient methods of cooking to build confidence in their capability to prepare    Cooking School weekly topics:  Adding Flavor- Sodium-Free  Fast and Healthy Breakfasts  Powerhouse Plant-Based Proteins  Satisfying Salads and Dressings  Simple Sides and Sauces  International Cuisine-Spotlight on the [employer] Zones  Delicious Desserts  Savory Soups  [employer] - Meals in a Astronomer Appetizers and Snacks  Comforting Weekend Breakfasts  One-Pot Wonders   Fast Evening Meals  Landscape architect Your Pritikin Plate  WORKSHOPS   Healthy Mindset (Psychosocial):  Focused Goals, Sustainable Changes Clinical staff led group instruction and group discussion with PowerPoint presentation and patient guidebook. To enhance the learning environment the use of posters, models and videos may be added. Patients will be able to apply effective goal setting strategies  to establish at least one personal goal, and then take consistent, meaningful action toward that goal. They will learn to identify common barriers to achieving personal goals and develop strategies to overcome them. Patients will also gain an understanding of how our mind-set can impact our ability to achieve goals and the importance of cultivating a positive and growth-oriented mind-set. The purpose of this lesson is to provide patients with a deeper understanding of how to set and achieve personal goals, as well as the tools and strategies needed to overcome common obstacles which may arise along the way.  From Head to Heart: The Power of a Healthy Outlook  Clinical staff led group instruction and group discussion with PowerPoint presentation and patient guidebook. To enhance the learning environment the use of posters, models and videos may be added. Patients will be able to recognize  and describe the impact of emotions and mood on physical health. They will discover the importance of self-care and explore self-care practices which may work for them. Patients will also learn how to utilize the 4 C's to cultivate a healthier outlook and better manage stress and challenges. The purpose of this lesson is to demonstrate to patients how a healthy outlook is an essential part of maintaining good health, especially as they continue their cardiac rehab journey.  Healthy Sleep for a Healthy Heart Clinical staff led group instruction and group discussion with PowerPoint presentation and patient guidebook. To enhance the learning environment the use of posters, models and videos may be added. At the conclusion of this workshop, patients will be able to demonstrate knowledge of the importance of sleep to overall health, well-being, and quality of life. They will understand the symptoms of, and treatments for, common sleep disorders. Patients will also be able to identify daytime and nighttime behaviors which impact sleep, and they will be able to apply these tools to help manage sleep-related challenges. The purpose of this lesson is to provide patients with a general overview of sleep and outline the importance of quality sleep. Patients will learn about a few of the most common sleep disorders. Patients will also be introduced to the concept of "sleep hygiene," and discover ways to self-manage certain sleeping problems through simple daily behavior changes. Finally, the workshop will motivate patients by clarifying the links between quality sleep and their goals of heart-healthy living.   Recognizing and Reducing Stress Clinical staff led group instruction and group discussion with PowerPoint presentation and patient guidebook. To enhance the learning environment the use of posters, models and videos may be added. At the conclusion of this workshop, patients will be able to understand the types of  stress reactions, differentiate between acute and chronic stress, and recognize the impact that chronic stress has on their health. They will also be able to apply different coping mechanisms, such as reframing negative self-talk. Patients will have the opportunity to practice a variety of stress management techniques, such as deep abdominal breathing, progressive muscle relaxation, and/or guided imagery.  The purpose of this lesson is to educate patients on the role of stress in their lives and to provide healthy techniques for coping with it.  Learning Barriers/Preferences:  Learning Barriers/Preferences - 02/04/24 1321       Learning Barriers/Preferences   Learning Barriers Sight    Learning Preferences Video;Pictoral;Computer/Internet          Education Topics:  Knowledge Questionnaire Score:  Knowledge Questionnaire Score - 02/04/24 1322       Knowledge Questionnaire Score  Pre Score 22/24          Core Components/Risk Factors/Patient Goals at Admission:  Personal Goals and Risk Factors at Admission - 02/04/24 1106       Core Components/Risk Factors/Patient Goals on Admission    Weight Management Yes;Weight Loss;Obesity    Intervention Weight Management: Develop a combined nutrition and exercise program designed to reach desired caloric intake, while maintaining appropriate intake of nutrient and fiber, sodium and fats, and appropriate energy expenditure required for the weight goal.;Weight Management: Provide education and appropriate resources to help participant work on and attain dietary goals.;Weight Management/Obesity: Establish reasonable short term and long term weight goals.    Admit Weight 212 lb 11.9 oz (96.5 kg)    Expected Outcomes Short Term: Continue to assess and modify interventions until short term weight is achieved;Long Term: Adherence to nutrition and physical activity/exercise program aimed toward attainment of established weight goal;Weight Loss:  Understanding of general recommendations for a balanced deficit meal plan, which promotes 1-2 lb weight loss per week and includes a negative energy balance of [phone] kcal/d;Understanding recommendations for meals to include 15-35% energy as protein, 25-35% energy from fat, 35-60% energy from carbohydrates, less than 200mg  of dietary cholesterol, 20-35 gm of total fiber daily;Understanding of distribution of calorie intake throughout the day with the consumption of 4-5 meals/snacks    Hypertension Yes    Intervention Provide education on lifestyle modifcations including regular physical activity/exercise, weight management, moderate sodium restriction and increased consumption of fresh fruit, vegetables, and low fat dairy, alcohol moderation, and smoking cessation.;Monitor prescription use compliance.    Expected Outcomes Short Term: Continued assessment and intervention until BP is < 140/62mm HG in hypertensive participants. < 130/2mm HG in hypertensive participants with diabetes, heart failure or chronic kidney disease.;Long Term: Maintenance of blood pressure at goal levels.    Lipids Yes    Intervention Provide education and support for participant on nutrition & aerobic/resistive exercise along with prescribed medications to achieve LDL 70mg , HDL >40mg .    Expected Outcomes Short Term: Participant states understanding of desired cholesterol values and is compliant with medications prescribed. Participant is following exercise prescription and nutrition guidelines.;Long Term: Cholesterol controlled with medications as prescribed, with individualized exercise RX and with personalized nutrition plan. Value goals: LDL < 70mg , HDL > 40 mg.          Core Components/Risk Factors/Patient Goals Review:   Goals and Risk Factor Review     Row Name 02/13/24 0755 03/13/24 1537 04/14/24 1312         Core Components/Risk Factors/Patient Goals Review   Personal Goals Review Weight  Management/Obesity;Hypertension;Lipids Weight Management/Obesity;Hypertension;Lipids Weight Management/Obesity;Hypertension;Lipids     Review Dee Farber started cardiac rehab on 02/10/24. Dee Farber is off to a good start to exercise. Some moderate exertional systolic  BP elevations noted will continue to monitor Dee Farber is doing well with exercise at  cardiac rehab. Dee Farber has increased his met levels. Jeff's blood pressures have improved since Slater Duncan NP added amlodipine . Recent systolic BP's have improved. Dee Farber continues to do well with exercise at  cardiac rehab. Dee Farber has increased his met levels. Dee Farber has exceeded his target heart rate since Dr Lorie Rook discontinued his beta blocker.     Expected Outcomes Dee Farber will continue to participate in cardiac rehab for exercise, nutrition and lifestyle modifications Dee Farber will continue to participate in cardiac rehab for exercise, nutrition and lifestyle modifications Dee Farber will continue to participate in cardiac rehab for exercise, nutrition and lifestyle modifications  Core Components/Risk Factors/Patient Goals at Discharge (Final Review):   Goals and Risk Factor Review - 04/14/24 1312       Core Components/Risk Factors/Patient Goals Review   Personal Goals Review Weight Management/Obesity;Hypertension;Lipids    Review Dee Farber continues to do well with exercise at  cardiac rehab. Dee Farber has increased his met levels. Dee Farber has exceeded his target heart rate since Dr Lorie Rook discontinued his beta blocker.    Expected Outcomes Dee Farber will continue to participate in cardiac rehab for exercise, nutrition and lifestyle modifications          ITP Comments:  ITP Comments     Row Name 02/04/24 1112 02/13/24 0743 03/13/24 1527 04/14/24 1309     ITP Comments Sophia Dustman turner, MD: Medical Director.  Intorduction to the [employer] / Intensive Cardiac Rehab.  Initial orientation packet reviewed with the patient. 30 Day ITP Review. Dee Farber started cardiac rehab  on 02/10/24. Dee Farber is off to a good start  with exercise. 30 Day ITP Review. Dee Farber has god attendance and participation wiht exercise at  cardiac rehab 30 Day ITP Review. Dee Farber continues to have  attendance and participation with exercise at cardiac rehab. Dee Farber will complete cardiac rehab tenatively at the beginning of July.       Comments: see ITP comments.Monte Antonio RN BSN

## 2024-04-15 ENCOUNTER — Encounter (HOSPITAL_COMMUNITY)
Admission: RE | Admit: 2024-04-15 | Discharge: 2024-04-15 | Disposition: A | Discharge status: Home / Self Care | Source: Ambulatory Visit | Attending: Cardiology | Admitting: Cardiology

## 2024-04-15 DIAGNOSIS — Z951 Presence of aortocoronary bypass graft: Secondary | ICD-10-CM

## 2024-04-15 DIAGNOSIS — I77810 Thoracic aortic ectasia: Secondary | ICD-10-CM

## 2024-04-15 DIAGNOSIS — Z952 Presence of prosthetic heart valve: Secondary | ICD-10-CM

## 2024-04-17 ENCOUNTER — Encounter (HOSPITAL_COMMUNITY)
Admission: RE | Admit: 2024-04-17 | Discharge: 2024-04-17 | Disposition: A | Discharge status: Home / Self Care | Source: Ambulatory Visit | Attending: Cardiology | Admitting: Cardiology

## 2024-04-17 DIAGNOSIS — Z951 Presence of aortocoronary bypass graft: Secondary | ICD-10-CM | POA: Diagnosis not present

## 2024-04-17 DIAGNOSIS — Z952 Presence of prosthetic heart valve: Secondary | ICD-10-CM

## 2024-04-20 ENCOUNTER — Encounter (HOSPITAL_COMMUNITY)
Admission: RE | Admit: 2024-04-20 | Discharge: 2024-04-20 | Disposition: A | Discharge status: Home / Self Care | Source: Ambulatory Visit | Attending: Cardiology | Admitting: Cardiology

## 2024-04-20 DIAGNOSIS — Z951 Presence of aortocoronary bypass graft: Secondary | ICD-10-CM | POA: Diagnosis not present

## 2024-04-20 DIAGNOSIS — Z952 Presence of prosthetic heart valve: Secondary | ICD-10-CM

## 2024-04-22 ENCOUNTER — Encounter (HOSPITAL_COMMUNITY)
Admission: RE | Admit: 2024-04-22 | Discharge: 2024-04-22 | Disposition: A | Discharge status: Home / Self Care | Source: Ambulatory Visit | Attending: Cardiology

## 2024-04-22 VITALS — Ht 69.0 in | Wt 213.8 lb

## 2024-04-22 DIAGNOSIS — I77810 Thoracic aortic ectasia: Secondary | ICD-10-CM

## 2024-04-22 DIAGNOSIS — Z952 Presence of prosthetic heart valve: Secondary | ICD-10-CM

## 2024-04-22 DIAGNOSIS — Z951 Presence of aortocoronary bypass graft: Secondary | ICD-10-CM

## 2024-04-24 ENCOUNTER — Encounter (HOSPITAL_COMMUNITY)
Admission: RE | Admit: 2024-04-24 | Discharge: 2024-04-24 | Disposition: A | Discharge status: Home / Self Care | Source: Ambulatory Visit | Attending: Cardiology | Admitting: Cardiology

## 2024-04-24 DIAGNOSIS — Z951 Presence of aortocoronary bypass graft: Secondary | ICD-10-CM

## 2024-04-24 DIAGNOSIS — Z952 Presence of prosthetic heart valve: Secondary | ICD-10-CM

## 2024-04-27 ENCOUNTER — Encounter (HOSPITAL_COMMUNITY)
Admission: RE | Admit: 2024-04-27 | Discharge: 2024-04-27 | Disposition: A | Discharge status: Home / Self Care | Source: Ambulatory Visit | Attending: Cardiology | Admitting: Cardiology

## 2024-04-27 DIAGNOSIS — I77810 Thoracic aortic ectasia: Secondary | ICD-10-CM

## 2024-04-27 DIAGNOSIS — Z951 Presence of aortocoronary bypass graft: Secondary | ICD-10-CM | POA: Diagnosis not present

## 2024-04-27 DIAGNOSIS — Z952 Presence of prosthetic heart valve: Secondary | ICD-10-CM

## 2024-04-29 ENCOUNTER — Encounter (HOSPITAL_COMMUNITY)
Admission: RE | Admit: 2024-04-29 | Discharge: 2024-04-29 | Disposition: A | Discharge status: Home / Self Care | Source: Ambulatory Visit | Attending: Cardiology | Admitting: Cardiology

## 2024-04-29 DIAGNOSIS — Z951 Presence of aortocoronary bypass graft: Secondary | ICD-10-CM | POA: Insufficient documentation

## 2024-04-29 DIAGNOSIS — Z952 Presence of prosthetic heart valve: Secondary | ICD-10-CM | POA: Diagnosis present

## 2024-04-29 DIAGNOSIS — I77810 Thoracic aortic ectasia: Secondary | ICD-10-CM | POA: Insufficient documentation

## 2024-04-29 NOTE — Progress Notes (Signed)
 Discharge Progress Report  Patient Details  Name: [name] MRN: [id number] Date of Birth: [birth year] Referring Provider:   Flowsheet Row INTENSIVE CARDIAC REHAB ORIENT from 02/04/2024 in [facility]  Referring Provider Wilbert Bihari, MD     Number of Visits: 24  Reason for Discharge:  Patient reached a stable level of exercise. Patient independent in their exercise. Patient has met program and personal goals.  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 S/P AVR (aortic valve replacement)  S/P aortic valve replacement  Ascending aorta dilatation (HCC)  Aortic root dilation (HCC)  ADL UCSD:   Initial Exercise Prescription:  Initial Exercise Prescription - 02/04/24 1300       Date of Initial Exercise RX and Referring Provider   Date 02/04/24    Referring Provider Wilbert Bihari, MD    Expected Discharge Date 04/29/24      Recumbant Bike   Level 2    RPM 60    Watts 41    Minutes 15    METs 3.3      NuStep   Level 2    SPM 80    Minutes 15    METs 3.3      Prescription Details   Frequency (times per week) 3    Duration Progress to 30 minutes of continuous aerobic without signs/symptoms of physical distress      Intensity   THRR 40-80% of Max Heartrate 58-116    Ratings of Perceived Exertion 11-13    Perceived Dyspnea 0-4      Progression   Progression Continue progressive overload as per policy without signs/symptoms or physical distress.      Resistance Training   Training Prescription Yes    Weight 4    Reps 10-15          Discharge Exercise Prescription (Final Exercise Prescription Changes):  Exercise Prescription Changes - 04/29/24 1030       Response to Exercise   Blood Pressure (Admit) 134/78    Blood Pressure (Exit) 124/74    Heart Rate (Admit) 91 bpm    Heart Rate  (Exercise) 135 bpm    Heart Rate (Exit) 100 bpm    Rating of Perceived Exertion (Exercise) 11    Symptoms None    Comments Pt graduated rom the CRP2 program today    Duration Continue with 30 min of aerobic exercise without signs/symptoms of physical distress.    Intensity THRR unchanged      Progression   Progression Continue to progress workloads to maintain intensity without signs/symptoms of physical distress.    Average METs 4      Resistance Training   Training Prescription No    Weight No weights on Wednesdays      Interval Training   Interval Training No      Recumbant Bike   Level 3    RPM 75    Watts 44    Minutes 15    METs 4.2      NuStep   Level 4    SPM 126    Minutes 15    METs 3.8      Home Exercise Plan   Plans to continue exercise at Home (comment)    Frequency Add 2 additional days to program exercise sessions.    Initial Home Exercises  Provided 03/09/24          Functional Capacity:  6 Minute Walk     Row Name 02/04/24 1314 04/22/24 0935       6 Minute Walk   Phase Initial Discharge    Distance 1590 feet 1723 feet    Distance % Change -- 8.36 %    Distance Feet Change -- 133 ft    Walk Time 6 minutes 6 minutes    # of Rest Breaks 0 0    MPH 3.01 3.3    METS 3.34 3.6    RPE 11 11    Perceived Dyspnea  1 0    VO2 Peak 11.7 12.5    Symptoms Yes (comment) No    Comments Mild SOB, RPD = 1 --    Resting HR 84 bpm 75 bpm    Resting BP 130/62 122/72    Resting Oxygen Saturation  98 % --    Exercise Oxygen Saturation  during 6 min walk 99 % --    Max Ex. HR 117 bpm 132 bpm    Max Ex. BP 160/78 142/68    2 Minute Post BP 140/76 128/68       Psychological, QOL, Others - Outcomes: PHQ 2/9:    04/29/2024    9:54 AM 03/30/2024    9:22 AM 02/04/2024   11:05 AM 07/05/2023    8:03 AM 05/27/2023    7:51 AM  Depression screen PHQ 2/9  Decreased Interest 0 0 0 0 0  Down, Depressed, Hopeless 0 0 0 0 0  PHQ - 2 Score 0 0 0 0 0  Altered sleeping  0  0 0 0  Tired, decreased energy 0  1 0 0  Change in appetite 0  0 0 0  Feeling bad or failure about yourself  0  0 0 0  Trouble concentrating 0  0 0 0  Moving slowly or fidgety/restless 0  0 0 0  Suicidal thoughts 0  0 0 0  PHQ-9 Score 0  1 0 0  Difficult doing work/chores   Somewhat difficult Not difficult at all Not difficult at all    Quality of Life:  Quality of Life - 04/22/24 1535       Quality of Life Scores   Health/Function Pre 19.33 %    Health/Function Post 20.27 %    Health/Function % Change 4.86 %    Socioeconomic Pre 20.36 %    Socioeconomic Post 22.5 %    Socioeconomic % Change  10.51 %    Psych/Spiritual Pre 16.36 %    Psych/Spiritual Post 20.93 %    Psych/Spiritual % Change 27.93 %    Family Pre 21.5 %    Family Post 25.5 %    Family % Change 18.6 %    GLOBAL Pre 19.25 %    GLOBAL Post 21.63 %    GLOBAL % Change 12.36 %          Personal Goals: Goals established at orientation with interventions provided to work toward goal.  Personal Goals and Risk Factors at Admission - 02/04/24 1106       Core Components/Risk Factors/Patient Goals on Admission    Weight Management Yes;Weight Loss;Obesity    Intervention Weight Management: Develop a combined nutrition and exercise program designed to reach desired caloric intake, while maintaining appropriate intake of nutrient and fiber, sodium and fats, and appropriate energy expenditure required for the weight goal.;Weight Management: Provide education and appropriate resources to  help participant work on and attain dietary goals.;Weight Management/Obesity: Establish reasonable short term and long term weight goals.    Admit Weight 212 lb 11.9 oz (96.5 kg)    Expected Outcomes Short Term: Continue to assess and modify interventions until short term weight is achieved;Long Term: Adherence to nutrition and physical activity/exercise program aimed toward attainment of established weight goal;Weight Loss: Understanding  of general recommendations for a balanced deficit meal plan, which promotes 1-2 lb weight loss per week and includes a negative energy balance of [phone] kcal/d;Understanding recommendations for meals to include 15-35% energy as protein, 25-35% energy from fat, 35-60% energy from carbohydrates, less than 200mg  of dietary cholesterol, 20-35 gm of total fiber daily;Understanding of distribution of calorie intake throughout the day with the consumption of 4-5 meals/snacks    Hypertension Yes    Intervention Provide education on lifestyle modifcations including regular physical activity/exercise, weight management, moderate sodium restriction and increased consumption of fresh fruit, vegetables, and low fat dairy, alcohol moderation, and smoking cessation.;Monitor prescription use compliance.    Expected Outcomes Short Term: Continued assessment and intervention until BP is < 140/44mm HG in hypertensive participants. < 130/43mm HG in hypertensive participants with diabetes, heart failure or chronic kidney disease.;Long Term: Maintenance of blood pressure at goal levels.    Lipids Yes    Intervention Provide education and support for participant on nutrition & aerobic/resistive exercise along with prescribed medications to achieve LDL 70mg , HDL >40mg .    Expected Outcomes Short Term: Participant states understanding of desired cholesterol values and is compliant with medications prescribed. Participant is following exercise prescription and nutrition guidelines.;Long Term: Cholesterol controlled with medications as prescribed, with individualized exercise RX and with personalized nutrition plan. Value goals: LDL < 70mg , HDL > 40 mg.           Personal Goals Discharge:  Goals and Risk Factor Review     Row Name 02/13/24 0755 03/13/24 1537 04/14/24 1312 05/14/24 1407       Core Components/Risk Factors/Patient Goals Review   Personal Goals Review Weight Management/Obesity;Hypertension;Lipids Weight  Management/Obesity;Hypertension;Lipids Weight Management/Obesity;Hypertension;Lipids Weight Management/Obesity;Hypertension;Lipids    Review Chyrl started cardiac rehab on 02/10/24. Chyrl is off to a good start to exercise. Some moderate exertional systolic  BP elevations noted will continue to monitor Chyrl is doing well with exercise at  cardiac rehab. Chyrl has increased his met levels. Jeff's blood pressures have improved since Rosaline bane NP added amlodipine . Recent systolic BP's have improved. Chyrl continues to do well with exercise at  cardiac rehab. Chyrl has increased his met levels. Chyrl has exceeded his target heart rate since Dr Wendel discontinued his beta blocker. Chyrl dif well with exercise at  cardiac rehab. Chyrl completed cardiac rehab on 04/29/24    Expected Outcomes Chyrl will continue to participate in cardiac rehab for exercise, nutrition and lifestyle modifications Chyrl will continue to participate in cardiac rehab for exercise, nutrition and lifestyle modifications Chyrl will continue to participate in cardiac rehab for exercise, nutrition and lifestyle modifications Chyrl will continue to exercise, follow nutrition and lifestyle modifications upon completion of cardiac rehab       Exercise Goals and Review:  Exercise Goals     Row Name 02/04/24 1108             Exercise Goals   Increase Physical Activity Yes       Intervention Provide advice, education, support and counseling about physical activity/exercise needs.;Develop an individualized exercise prescription for aerobic and resistive training based on  initial evaluation findings, risk stratification, comorbidities and participant's personal goals.       Expected Outcomes Short Term: Attend rehab on a regular basis to increase amount of physical activity.;Long Term: Exercising regularly at least 3-5 days a week.;Long Term: Add in home exercise to make exercise part of routine and to increase amount of physical activity.        Increase Strength and Stamina Yes       Intervention Provide advice, education, support and counseling about physical activity/exercise needs.;Develop an individualized exercise prescription for aerobic and resistive training based on initial evaluation findings, risk stratification, comorbidities and participant's personal goals.       Expected Outcomes Short Term: Increase workloads from initial exercise prescription for resistance, speed, and METs.;Short Term: Perform resistance training exercises routinely during rehab and add in resistance training at home;Long Term: Improve cardiorespiratory fitness, muscular endurance and strength as measured by increased METs and functional capacity ( )       Able to understand and use rate of perceived exertion (RPE) scale Yes       Intervention Provide education and explanation on how to use RPE scale       Expected Outcomes Short Term: Able to use RPE daily in rehab to express subjective intensity level;Long Term:  Able to use RPE to guide intensity level when exercising independently       Knowledge and understanding of Target Heart Rate Range (THRR) Yes       Intervention Provide education and explanation of THRR including how the numbers were predicted and where they are located for reference       Expected Outcomes Short Term: Able to state/look up THRR;Long Term: Able to use THRR to govern intensity when exercising independently;Short Term: Able to use daily as guideline for intensity in rehab       Understanding of Exercise Prescription Yes       Intervention Provide education, explanation, and written materials on patient's individual exercise prescription       Expected Outcomes Short Term: Able to explain program exercise prescription;Long Term: Able to explain home exercise prescription to exercise independently          Exercise Goals Re-Evaluation:  Exercise Goals Re-Evaluation     Row Name 02/10/24 1637 03/09/24 0800 04/08/24 1121  04/29/24 1030       Exercise Goal Re-Evaluation   Exercise Goals Review Increase Physical Activity;Understanding of Exercise Prescription;Increase Strength and Stamina;Knowledge and understanding of Target Heart Rate Range (THRR);Able to understand and use rate of perceived exertion (RPE) scale Increase Physical Activity;Understanding of Exercise Prescription;Increase Strength and Stamina;Knowledge and understanding of Target Heart Rate Range (THRR);Able to understand and use rate of perceived exertion (RPE) scale Increase Physical Activity;Understanding of Exercise Prescription;Increase Strength and Stamina;Knowledge and understanding of Target Heart Rate Range (THRR);Able to understand and use rate of perceived exertion (RPE) scale Increase Physical Activity;Understanding of Exercise Prescription;Increase Strength and Stamina;Knowledge and understanding of Target Heart Rate Range (THRR);Able to understand and use rate of perceived exertion (RPE) scale    Comments Pt's first day in the CRP2 program. Pt understands the exercise Rx, RPE scale and THRR. Reviewed METs, goals and HERx with patient today. Pt voices improvement in his goals of improved energy and less SOB. Pt notices his breathing is getting better. Pt has not made much progress on his goal of weight loss, but will have dietician speak with him. Reviewed METs, goals and HERx with patient today. Pt continues to voice improvement in his  goal of improved energy. He voices his SOB has gotten much better. Pt still has not made much progress on his goal of weight loss, but will have dietician speak with him again. Pt voices I'm feeling great. Pt graduatefd from the CRP2 program today. PT met his goals of improved energy and less SOB. Pt unfortunately did not have any sigificant weight loss, but plans to continue working on this goal. Pt voicves tha program has been helpful and he is feeling much better. Pt is exercising in th epool at the [facility] in addtion  to the CRP2 program. He will continue with his pool exercise, walking and resistance training. Pt is at 150+ minutes of exercise per week.    Expected Outcomes will continue to monitor patient and progress exercise workloads as tolerated. will continue to monitor patient and progress exercise workloads as tolerated. Will continue to monitor patient and progress exercise workloads as tolerated. Pt will continue to exercise at the [facility].       Nutrition & Weight - Outcomes:  Pre Biometrics - 02/04/24 1040       Pre Biometrics   Waist Circumference 44 inches    Hip Circumference 43.75 inches    Waist to Hip Ratio 1.01 %    Triceps Skinfold 12 mm    % Body Fat 30 %    Grip Strength 22 kg    Flexibility 10.75 in    Single Leg Stand 6.25 seconds          Post Biometrics - 04/22/24 0945        Post  Biometrics   Height 5' 9 (1.753 m)    Weight 97 kg    Waist Circumference 42 inches    Hip Circumference 42 inches    Waist to Hip Ratio 1 %    BMI (Calculated) 31.57    Triceps Skinfold 12 mm    % Body Fat 29.1 %    Grip Strength 22 kg    Flexibility 10.5 in    Single Leg Stand 30 seconds          Nutrition:  Nutrition Therapy & Goals - 04/29/24 1131       Nutrition Therapy   Diet Heart Healthy Diet    Drug/Food Interactions Statins/Certain Fruits      Personal Nutrition Goals   Comments Goals in progress. Patient has medical history of s/p AVR, CABG x2, hyperlipidemia, HTN, CAD, hypothyroidism. Hospitalization was complicated by renal insufficiency; BMP improved. He has attended the [employer] education and nutrition series regularly. A1c is in a prediabetic range. Lipids WNL and LDL improved but remains slightly above goal. Patient is motivated to lose weight; have discussed strategies for weight loss including calorie density, the plate method as a guide for meal planning, label reading, etc. He did not lose weight throughout duration of cardiac rehab. Patient will benefit  from adherence to nutrition, exercise, and lifestyle modification recommendations      Intervention Plan   Intervention Prescribe, educate and counsel regarding individualized specific dietary modifications aiming towards targeted core components such as weight, hypertension, lipid management, diabetes, heart failure and other comorbidities.;Nutrition handout(s) given to patient.    Expected Outcomes Short Term Goal: Understand basic principles of dietary content, such as calories, fat, sodium, cholesterol and nutrients.;Long Term Goal: Adherence to prescribed nutrition plan.          Nutrition Discharge:  Nutrition Assessments - 02/18/24 1518       Rate Your Plate Scores  Pre Score 46          Education Questionnaire Score:  Knowledge Questionnaire Score - 04/22/24 1536       Knowledge Questionnaire Score   Post Score 21/24          Goals reviewed with patient; copy given to patient.Pt graduated from  Intensive/Traditional cardiac rehab program on 04/29/24  with completion of   32 exercise and  32 education sessions. Pt maintained good attendance and progressed nicely during his participation in rehab as evidenced by increased MET level. Chyrl increased his distance on his post exercise walk test by 130 feet.   Medication list reconciled. Repeat  PHQ score-  0.  Pt has made significant lifestyle changes and should be commended for his success. Chyrl  achieved his goals during cardiac rehab.   Pt plans to continue exercise at the [facility]. We are pleased with Jeff's progress!Hadassah Elpidio Quan RN BSN

## 2024-05-12 ENCOUNTER — Ambulatory Visit (INDEPENDENT_AMBULATORY_CARE_PROVIDER_SITE_OTHER): Admitting: Family Medicine

## 2024-05-12 ENCOUNTER — Encounter: Payer: Self-pay | Admitting: Family Medicine

## 2024-05-12 VITALS — BP 138/70 | HR 99 | Temp 98.9°F | Ht 69.0 in | Wt 215.4 lb

## 2024-05-12 DIAGNOSIS — H6123 Impacted cerumen, bilateral: Secondary | ICD-10-CM | POA: Diagnosis not present

## 2024-05-12 DIAGNOSIS — I10 Essential (primary) hypertension: Secondary | ICD-10-CM

## 2024-05-12 DIAGNOSIS — E039 Hypothyroidism, unspecified: Secondary | ICD-10-CM

## 2024-05-12 NOTE — Assessment & Plan Note (Addendum)
 Bilateral  After simple irrigation- left canal is clear and right canal has some remaining cerumen   Given option of 2 wk of debrox at home /trial of irrigation here again after that or ENT Pt choose home treatment and follow up in 2 weeks   Call back and Er precautions noted in detail today

## 2024-05-12 NOTE — Progress Notes (Unsigned)
     Kolyn Rozario T. Aksel Bencomo, MD, CAQ Sports Medicine [facility] [address] [place], [address]  Phone: [phone]  FAX: [phone]  [name] - 78 y.o. male  MRN [id number]  Date of Birth: [birth year]  Date: 05/13/2024  PCP: Randeen Laine LABOR, MD  Referral: Randeen Laine LABOR, MD  No chief complaint on file.  Subjective:   Tayton Decaire is a 79 y.o. very pleasant male patient with There is no height or weight on file to calculate BMI. who presents with the following:  He is a pleasant patient who I recall well, and I last saw him about 1 year ago for arthritis of the left knee.  The last time I saw him I did a left-sided knee injection and he had a good response.    ICD-10-CM   1. Primary osteoarthritis of left knee  M17.12      Aspiration/Injection Procedure Note Elric Tirado 13-Oct-1946 Date of procedure: 05/13/2024  Procedure: Large Joint Aspiration / Injection of Knee, L Indications: Pain  Procedure Details Patient verbally consented to procedure. Risks, benefits, and alternatives explained. Sterilely prepped with Chloraprep. Ethyl cholride used for anesthesia. 9 cc Lidocaine  1% mixed with 1 mL of Kenalog  40 mg injected using the anteromedial approach without difficulty. No complications with procedure and tolerated well. Patient had decreased pain post-injection. Medication: 1 mL of Kenalog  40 mg   Medication Management during today's office visit: No orders of the defined types were placed in this encounter.  There are no discontinued medications.  Orders placed today for conditions managed today: No orders of the defined types were placed in this encounter.   Disposition: No follow-ups on file.  [employer] One speech-to-text software was used for transcription in this dictation.  Possible transcriptional errors can occur using Animal nutritionist.   Signed,  Jacques DASEN. Evangeline Utley,  MD   Outpatient Encounter Medications as of 05/13/2024  Medication Sig   amLODipine  (NORVASC ) 5 MG tablet Take 1 tablet (5 mg total) by mouth daily.   aspirin  EC 81 MG tablet Take 1 tablet (81 mg total) by mouth daily. Swallow whole.   ezetimibe  (ZETIA ) 10 MG tablet Take 1 tablet (10 mg total) by mouth daily.   levothyroxine  (SYNTHROID ) 50 MCG tablet Take 1 tablet (50 mcg total) by mouth daily.   losartan  (COZAAR ) 25 MG tablet Take 1 tablet (25 mg total) by mouth daily.   Multiple Vitamin (MULTIVITAMIN WITH MINERALS) TABS tablet Take 1 tablet by mouth in the morning. Centrum Silver   rosuvastatin  (CRESTOR ) 40 MG tablet Take 1 tablet (40 mg total) by mouth daily.   No facility-administered encounter medications on file as of 05/13/2024.

## 2024-05-12 NOTE — Assessment & Plan Note (Signed)
 Last visit increase levothyroxine  from 25 to 50 mcg daily for TSH of 9.99  Re check today No clinical changes

## 2024-05-12 NOTE — Assessment & Plan Note (Signed)
 Improved with addition of losartan    bp in fair control at this time  BP Readings from Last 1 Encounters:  05/12/24 138/70   No changes needed Most recent labs reviewed  Disc lifstyle change with low sodium diet and exercise    Losartan  25 mg daily  Amlodipine  5 mg daily   Continues cardiology care

## 2024-05-12 NOTE — Patient Instructions (Addendum)
 Continue current medicines   Try to get most of your carbohydrates from produce (with the exception of white potatoes) and whole grains Eat less bread/pasta/rice/snack foods/cereals/sweets and other items from the middle of the grocery store (processed carbs) Avoid salty processed foods   Stay hydrated   Thyroid  labs today   Keep using the debrox ear solution  You can try irrigating at home also   If not improved (on the right) in 2 weeks, schedule a follow up and we can try flushing it again

## 2024-05-12 NOTE — Progress Notes (Signed)
 Subjective:    Patient ID: [name], male    DOB: Sep 03, 1946, 78 y.o.   MRN: [id number]  HPI  Wt Readings from Last 3 Encounters:  05/12/24 215 lb 6 oz (97.7 kg)  04/22/24 213 lb 13.5 oz (97 kg)  03/30/24 213 lb 6 oz (96.8 kg)   31.81 kg/m  Vitals:   05/12/24 1547 05/12/24 1613  BP: (!) 156/86 138/70  Pulse: 99   Temp: 98.9 F (37.2 C)   SpO2: 99%     Pt presents for follow up of  Ears-cerumen impaction  HTN  Labs for thyroid    Using debox in ears  No wax came out on own  No ear pain    HTN bp is stable today  No cp or palpitations or headaches or edema  No side effects to medicines  BP Readings from Last 3 Encounters:  05/12/24 138/70  03/30/24 (!) 141/80  03/25/24 (!) 144/66    Pulse Readings from Last 3 Encounters:  05/12/24 99  03/30/24 92  03/25/24 93   Amlodipine  5 mg daily  Losartan  25 mg daily   Blood pressure has been quite good  Had one reading was under 100   Usually 130s70s   Lab Results  Component Value Date   NA 141 04/08/2024   K 5.0 04/08/2024   CO2 17 (L) 04/08/2024   GLUCOSE 121 (H) 04/08/2024   BUN 16 04/08/2024   CREATININE 1.08 04/08/2024   CALCIUM  9.6 04/08/2024   GFR 83.92 07/05/2023   EGFR 72 04/08/2024   GFRNONAA 25 (L) 12/24/2023   Lab Results  Component Value Date   TSH 9.99 (H) 03/30/2024   Levothyroxine  50 mcg (was increase from 25 mcg)  No clinical changes     Lab Results  Component Value Date   WBC 5.7 03/30/2024   HGB 11.7 (L) 03/30/2024   HCT 34.4 (L) 03/30/2024   MCV 92.3 03/30/2024   PLT 121.0 (L) 03/30/2024    Post operative anemia did improve    Prediabetes Lab Results  Component Value Date   HGBA1C 5.9 03/30/2024   HGBA1C 5.7 (H) 12/04/2023   HGBA1C 5.8 07/05/2023         Patient Active Problem List   Diagnosis Date Noted   Cerumen impaction 03/30/2024   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   Ascending aorta dilatation (HCC)    S/P AVR (aortic valve replacement)  12/06/2023   Aortic stenosis 07/05/2023   CAD (coronary artery disease) 07/05/2023   Urinary hesitancy 03/22/2023   Hyperkalemia 03/22/2023   Hypothyroid 03/08/2021   History of colon polyps 03/08/2021   Alcohol intake above recommended sensible limits 05/13/2019   Hyperlipidemia 02/25/2019   Colon cancer screening 02/12/2017   Routine general medical examination at a health care facility 01/20/2016   Heart murmur, systolic 01/13/2014   Elevated PSA 01/13/2014   Encounter for Medicare annual wellness exam 09/24/2011   Prostate cancer screening 09/24/2011   Osteoarthritis, knee 02/14/2011   Tinnitus 12/09/2009   BACK PAIN, LUMBAR, WITH RADICULOPATHY 10/10/2009   Essential hypertension, benign 08/16/2009   Thrombocytopenia (HCC) 02/15/2009   Prediabetes 01/07/2008   Past Medical History:  Diagnosis Date   Aortic stenosis    s/p  23 mm Inspiris valve present in the aortic position.  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  12/2023   Back pain    Coronary artery disease    Diverticulosis    Heart murmur    History of colon polyps    Hyperlipidemia    on meds   Hypertension    on meds   Hypothyroidism    Moderate aortic stenosis    Pre-diabetes    Thyroid  disease    on meds   Past Surgical History:  Procedure Laterality Date   AORTIC VALVE REPLACEMENT N/A 12/06/2023   Procedure: AORTIC VALVE REPLACEMENT (AVR) USING INSPIRIS AORTIC VALVE SIZE 23 MM;  Surgeon: Maryjane Mt, MD;  Location: [facility] OR;  Service: Open Heart Surgery;  Laterality: N/A;   CARDIOVERSION  12/20/2023   Procedure: CARDIOVERSION;  Surgeon: Maryjane Mt, MD;  Location: [facility] OR;  Service: Thoracic;;   COLONOSCOPY  2018   SA-MAC-suprep-ageq with lavage-tics/int hems/TA x 5 frags   CORONARY ARTERY BYPASS GRAFT N/A 12/06/2023   Procedure: CORONARY ARTERY BYPASS GRAFTING (CABG) TIMES TWO USING LEFT  INTERNAL MAMMARY ARTERY AND ENDOSCOPICALLY HARVESTED RIGHT GREATER SAPHENOUS VEIN;  Surgeon: Maryjane Mt, MD;  Location: [facility] OR;  Service: Open Heart Surgery;  Laterality: N/A;   INGUINAL HERNIA REPAIR Bilateral 02/13/2017   Procedure: LAPAROSCOPIC BILATERAL INGUINAL HERNIA REPAIR WITH MESH;  Surgeon: Vicenta Poli, MD;  Location: [facility] OR;  Service: General;  Laterality: Bilateral;   LASIK     PERICARDIAL FLUID DRAINAGE  12/20/2023   Procedure: DRAINAGE OF PERICARDIAL FLUID;  Surgeon: Maryjane Mt, MD;  Location: [facility] OR;  Service: Thoracic;;   POLYPECTOMY  2018   TA x 5   RIGHT HEART CATH AND CORONARY ANGIOGRAPHY N/A 07/03/2023   Procedure: RIGHT HEART CATH AND CORONARY ANGIOGRAPHY;  Surgeon: Wendel Lurena POUR, MD;  Location: [facility] INVASIVE CV LAB;  Service: Cardiovascular;  Laterality: N/A;   SUBXYPHOID PERICARDIAL WINDOW N/A 12/20/2023   Procedure: SUBXYPHOID PERICARDIAL WINDOW WITH CONVERSION TO STERNOTOMY. REPAIR OF RV INJURY;  Surgeon: Maryjane Mt, MD;  Location: [facility] OR;  Service: Thoracic;  Laterality: N/A;   TEE WITHOUT CARDIOVERSION N/A 12/06/2023   Procedure: TRANSESOPHAGEAL ECHOCARDIOGRAM (TEE);  Surgeon: Maryjane Mt, MD;  Location: [facility] OR;  Service: Open Heart Surgery;  Laterality: N/A;   TEE WITHOUT CARDIOVERSION N/A 12/20/2023   Procedure: TRANSESOPHAGEAL ECHOCARDIOGRAM (TEE);  Surgeon: Maryjane Mt, MD;  Location: [facility] OR;  Service: Thoracic;  Laterality: N/A;   UMBILICAL HERNIA REPAIR N/A 02/13/2017   Procedure: LAPAROSCOPIC UMBILICAL HERNIA REPAIR;  Surgeon: Vicenta Poli, MD;  Location: [facility] OR;  Service: General;  Laterality: N/A;   WISDOM TOOTH EXTRACTION     Social History   Tobacco Use   Smoking status: Never   Smokeless tobacco: Never  Vaping Use   Vaping status: Never Used  Substance Use Topics   Alcohol use: Yes    Alcohol/week: 21.0 - 28.0 standard drinks of alcohol    Types: 21 - 28 Cans of beer per week    Comment: 3-4 cans of beer daily   Drug use: No   Family  History  Problem Relation Age of Onset   Cancer Mother        brain tumor   Emphysema Father    Diabetes Father    HIV Brother    Colon cancer Neg Hx    Colon polyps Neg Hx    Esophageal cancer Neg Hx    Rectal cancer Neg Hx    Stomach cancer Neg Hx    Allergies  Allergen Reactions   Vytorin [Ezetimibe -Simvastatin] Other (See Comments)  back pain   Current Outpatient Medications on File Prior to Visit  Medication Sig Dispense Refill   amLODipine  (NORVASC ) 5 MG tablet Take 1 tablet (5 mg total) by mouth daily. 90 tablet 3   aspirin  EC 81 MG tablet Take 1 tablet (81 mg total) by mouth daily. Swallow whole.     ezetimibe  (ZETIA ) 10 MG tablet Take 1 tablet (10 mg total) by mouth daily. 90 tablet 3   levothyroxine  (SYNTHROID ) 50 MCG tablet Take 1 tablet (50 mcg total) by mouth daily. 90 tablet 0   losartan  (COZAAR ) 25 MG tablet Take 1 tablet (25 mg total) by mouth daily. 90 tablet 3   Multiple Vitamin (MULTIVITAMIN WITH MINERALS) TABS tablet Take 1 tablet by mouth in the morning. Centrum Silver     rosuvastatin  (CRESTOR ) 40 MG tablet Take 1 tablet (40 mg total) by mouth daily. 90 tablet 3   No current facility-administered medications on file prior to visit.    Review of Systems  Constitutional:  Negative for activity change, appetite change, fatigue, fever and unexpected weight change.  HENT:  Positive for hearing loss. Negative for congestion, rhinorrhea, sore throat and trouble swallowing.   Eyes:  Negative for pain, redness, itching and visual disturbance.  Respiratory:  Negative for cough, chest tightness, shortness of breath and wheezing.   Cardiovascular:  Negative for chest pain and palpitations.  Gastrointestinal:  Negative for abdominal pain, blood in stool, constipation, diarrhea and nausea.  Endocrine: Negative for cold intolerance, heat intolerance, polydipsia and polyuria.  Genitourinary:  Negative for difficulty urinating, dysuria, frequency and urgency.   Musculoskeletal:  Negative for arthralgias, joint swelling and myalgias.  Skin:  Negative for pallor and rash.  Neurological:  Negative for dizziness, tremors, weakness, numbness and headaches.  Hematological:  Negative for adenopathy. Does not bruise/bleed easily.  Psychiatric/Behavioral:  Negative for decreased concentration and dysphoric mood. The patient is not nervous/anxious.        Objective:   Physical Exam Constitutional:      General: He is not in acute distress.    Appearance: Normal appearance. He is well-developed. He is obese. He is not ill-appearing or diaphoretic.  HENT:     Head: Normocephalic and atraumatic.     Right Ear: There is impacted cerumen.     Left Ear: There is impacted cerumen.     Ears:     Comments: Bilateral soft cerumen impaction   Procedure: Cerumen Disimpaction  Warm water was applied and gentle ear lavage performed on both ears.   There were no complications and following the disimpaction the tympanic membrane were visible on the left side with remaining cerumen on right that could not be removed with curettte.  Tympanic membrane is intact following the procedure.  Auditory canals are normal.  The patient reported relief of symptoms after removal of cerumen. Pt tolerated procedure well    Eyes:     Conjunctiva/sclera: Conjunctivae normal.     Pupils: Pupils are equal, round, and reactive to light.  Neck:     Thyroid : No thyromegaly.     Vascular: No carotid bruit or JVD.  Cardiovascular:     Rate and Rhythm: Normal rate and regular rhythm.     Heart sounds: Murmur heard.     No gallop.  Pulmonary:     Effort: Pulmonary effort is normal. No respiratory distress.     Breath sounds: Normal breath sounds. No wheezing or rales.  Abdominal:     General: There is no distension  or abdominal bruit.     Palpations: Abdomen is soft.  Musculoskeletal:     Cervical back: Normal range of motion and neck supple.     Right lower leg: No edema.      Left lower leg: No edema.  Lymphadenopathy:     Cervical: No cervical adenopathy.  Skin:    General: Skin is warm and dry.     Coloration: Skin is not pale.     Findings: No rash.  Neurological:     Mental Status: He is alert.     Coordination: Coordination normal.     Deep Tendon Reflexes: Reflexes are normal and symmetric. Reflexes normal.  Psychiatric:        Mood and Affect: Mood normal.           Assessment & Plan:   Problem List Items Addressed This Visit       Cardiovascular and Mediastinum   Essential hypertension, benign - Primary   Improved with addition of losartan    bp in fair control at this time  BP Readings from Last 1 Encounters:  05/12/24 138/70   No changes needed Most recent labs reviewed  Disc lifstyle change with low sodium diet and exercise    Losartan  25 mg daily  Amlodipine  5 mg daily   Continues cardiology care       Relevant Orders   Basic metabolic panel with GFR     Endocrine   Hypothyroid   Last visit increase levothyroxine  from 25 to 50 mcg daily for TSH of 9.99  Re check today No clinical changes       Relevant Orders   TSH     Nervous and Auditory   Cerumen impaction   Bilateral  After simple irrigation- left canal is clear and right canal has some remaining cerumen   Given option of 2 wk of debrox at home /trial of irrigation here again after that or ENT Pt choose home treatment and follow up in 2 weeks   Call back and Er precautions noted in detail today

## 2024-05-13 ENCOUNTER — Encounter: Payer: Self-pay | Admitting: Family Medicine

## 2024-05-13 ENCOUNTER — Ambulatory Visit: Payer: Self-pay | Admitting: Family Medicine

## 2024-05-13 ENCOUNTER — Ambulatory Visit: Admitting: Family Medicine

## 2024-05-13 VITALS — BP 130/66 | HR 99 | Temp 97.7°F | Ht 67.5 in | Wt 214.2 lb

## 2024-05-13 DIAGNOSIS — M1712 Unilateral primary osteoarthritis, left knee: Secondary | ICD-10-CM

## 2024-05-13 DIAGNOSIS — E039 Hypothyroidism, unspecified: Secondary | ICD-10-CM

## 2024-05-13 LAB — BASIC METABOLIC PANEL WITH GFR
BUN: 25 mg/dL — ABNORMAL HIGH (ref 6–23)
CO2: 24 meq/L (ref 19–32)
Calcium: 9.5 mg/dL (ref 8.4–10.5)
Chloride: 104 meq/L (ref 96–112)
Creatinine, Ser: 1.12 mg/dL (ref 0.40–1.50)
GFR: 64.16 mL/min (ref >60.00)
Glucose, Bld: 100 mg/dL — ABNORMAL HIGH (ref 70–99)
Potassium: 3.9 meq/L (ref 3.5–5.1)
Sodium: 138 meq/L (ref 135–145)

## 2024-05-13 LAB — TSH: TSH: 6.8 u[IU]/mL — ABNORMAL HIGH (ref 0.35–5.50)

## 2024-05-13 MED ORDER — LEVOTHYROXINE SODIUM 75 MCG PO TABS: 75.0000 ug | ORAL_TABLET | Freq: Every day | ORAL | 0 refills | Status: DC

## 2024-05-13 MED ORDER — TRIAMCINOLONE ACETONIDE 40 MG/ML IJ SUSP
40.0000 mg | Freq: Once | INTRAMUSCULAR | Status: AC
  Administered 2024-05-13: 40 mg via INTRA_ARTICULAR

## 2024-06-26 ENCOUNTER — Other Ambulatory Visit: Payer: Self-pay | Admitting: Family Medicine

## 2024-07-01 ENCOUNTER — Other Ambulatory Visit (INDEPENDENT_AMBULATORY_CARE_PROVIDER_SITE_OTHER)

## 2024-07-01 ENCOUNTER — Ambulatory Visit: Payer: Self-pay | Admitting: Family Medicine

## 2024-07-01 DIAGNOSIS — E039 Hypothyroidism, unspecified: Secondary | ICD-10-CM | POA: Diagnosis not present

## 2024-07-01 LAB — TSH: TSH: 1.67 u[IU]/mL (ref 0.35–5.50)

## 2024-07-20 ENCOUNTER — Other Ambulatory Visit (HOSPITAL_COMMUNITY): Payer: Self-pay

## 2024-07-20 ENCOUNTER — Encounter: Payer: Self-pay | Admitting: Family Medicine

## 2024-07-20 ENCOUNTER — Encounter: Payer: Self-pay | Admitting: Physician Assistant

## 2024-07-20 ENCOUNTER — Encounter: Attending: Cardiology | Admitting: Physician Assistant

## 2024-07-20 ENCOUNTER — Encounter: Payer: Self-pay | Admitting: Cardiology

## 2024-07-20 VITALS — BP 142/76 | HR 145 | Ht 68.0 in | Wt 210.6 lb

## 2024-07-20 DIAGNOSIS — Z951 Presence of aortocoronary bypass graft: Secondary | ICD-10-CM | POA: Insufficient documentation

## 2024-07-20 DIAGNOSIS — I452 Bifascicular block: Secondary | ICD-10-CM | POA: Insufficient documentation

## 2024-07-20 DIAGNOSIS — Z952 Presence of prosthetic heart valve: Secondary | ICD-10-CM | POA: Insufficient documentation

## 2024-07-20 DIAGNOSIS — I10 Essential (primary) hypertension: Secondary | ICD-10-CM | POA: Diagnosis not present

## 2024-07-20 DIAGNOSIS — I700 Atherosclerosis of aorta: Secondary | ICD-10-CM | POA: Diagnosis not present

## 2024-07-20 DIAGNOSIS — N184 Chronic kidney disease, stage 4 (severe): Secondary | ICD-10-CM | POA: Diagnosis not present

## 2024-07-20 MED ORDER — METOPROLOL TARTRATE 25 MG PO TABS: 25.0000 mg | ORAL_TABLET | Freq: Two times a day (BID) | ORAL | 3 refills | Status: AC

## 2024-07-20 MED ORDER — APIXABAN 5 MG PO TABS
5.0000 mg | ORAL_TABLET | Freq: Two times a day (BID) | ORAL | 0 refills | Status: AC
  Filled 2024-07-20: qty 60, 30d supply, fill #0

## 2024-07-20 MED ORDER — APIXABAN 5 MG PO TABS: 5.0000 mg | ORAL_TABLET | Freq: Two times a day (BID) | ORAL | 3 refills | Status: DC

## 2024-07-20 NOTE — Progress Notes (Signed)
 Cardiology Office Note:  .   Date:  07/20/2024  ID:  [name], DOB 1945-12-09, MRN [id number] PCP: Randeen Laine LABOR, MD  [facility] Providers Cardiologist:  Wilbert Bihari, MD    History of Present Illness: .   [name] is a 78 y.o. male with history of CAD s/p CABG x 1 LIMA to LAD SVG to diagonal and AVR 11/2023, post op Afib and tamponade s/p pericardial window, HTN, HLD, CKD stage 4, bifascicular blosck.   Patient added onto my schedule due to resting HR 140/m. It was high on treadmill machine but stayed up. Asymptomatic. He went to [place] over the weekend and had no problems. Drinking 2-3 cocktails nightly. Not much caffeine.  ROS:    Studies Reviewed: SABRA    EKG Interpretation Date/Time:  Monday July 20 2024 13:21:54 EDT Ventricular Rate:  145 PR Interval:    QRS Duration:  138 QT Interval:  344 QTC Calculation: 534 R Axis:   -33  Text Interpretation: Wide QRS tachycardia ? Atrial flutter Left axis deviation Left bundle branch block When compared with ECG of 20-Dec-2023 13:54, Wide QRS tachycardia has replaced Sinus rhythm Vent. rate has increased BY  64 BPM Confirmed by Parthenia Klinefelter [phone]) on 07/20/2024 1:47:18 PM    Prior CV Studies:   Echo 12/2023 IMPRESSIONS     1. Left ventricular ejection fraction, by estimation, is 60 to 65%. The  left ventricle has normal function. The left ventricle has no regional  wall motion abnormalities. Left ventricular diastolic parameters are  indeterminate. The average left  ventricular global longitudinal strain is -19.2 %. The global longitudinal  strain is normal.   2. Right ventricular systolic function is normal. The right ventricular  size is normal.   3. Left atrial size was mildly dilated.   4. The mitral valve is normal in structure. Trivial mitral valve  regurgitation. No evidence of mitral stenosis.   5. The aortic valve has been repaired/replaced. Aortic valve  regurgitation is not  visualized. No aortic stenosis is present. There is a  23 mm Inspiris valve present in the aortic position. Procedure Date:  12/06/23. Echo findings are consistent with normal   structure and function of the aortic valve prosthesis. Aortic valve mean  gradient measures 10.0 mmHg. Aortic valve Vmax measures 2.25 m/s.   6. There is mild dilatation of the ascending aorta, measuring 41 mm.   7. The inferior vena cava is normal in size with greater than 50%  respiratory variability, suggesting right atrial pressure of 3 mmHg.   Comparison(s): No pericardial effusion.    Risk Assessment/Calculations:    CHA2DS2-VASc Score = 6   This indicates a 9.7% annual risk of stroke. The patient's score is based upon: CHF History: 1 HTN History: 1 Diabetes History: 1 Stroke History: 0 Vascular Disease History: 1 Age Score: 2 Gender Score: 0         Physical Exam:   VS:  BP (!) 142/76   Pulse (!) 145   Ht 5' 8 (1.727 m)   Wt 210 lb 9.6 oz (95.5 kg)   SpO2 98%   BMI 32.02 kg/m    Orhtostatics: No data found. Wt Readings from Last 3 Encounters:  07/20/24 210 lb 9.6 oz (95.5 kg)  05/13/24 214 lb 4 oz (97.2 kg)  05/12/24 215 lb 6 oz (97.7 kg)    GEN: Well nourished, well developed in no acute distress NECK: No JVD; No carotid bruits CARDIAC: RRR 140/m,  no murmurs, rubs, gallops RESPIRATORY:  Clear to auscultation without rales, wheezing or rhonchi  ABDOMEN: Soft, non-tender, non-distended EXTREMITIES:  No edema; No deformity   ASSESSMENT AND PLAN: .    Tachycardia 140/m reviewed EKG with Dr. [place] who concurs it's most likely Atrial flutter. Will start Eliquis  5 mg bid, stop ASA, start metoprolol  25 mg bid, refer to Afib clinic. He's overall asymptomatic so we don't know when this started therefore we can't send him to ED for cardioversion. If he develops symptoms he needs to go to ED.   S/P AVR/ Aortic stenosis: Mean gradient 10 mmHg and ejection fraction of 60-65% on echocardiogram  from March 2025.  Continue aspirin  81 mg.  Status post CABG x 2: Continue but decrease to 81 mg.  Continue rosuvastatin  40 mg, Zetia  10 mg, stop Lopressor  50 mg as ejection fraction is normal.  Hypertension:up a little but tachycardic and adding metoprolol   Hyperlipidemia:  LDL 74 02/2024   Aortic atherosclerosis: , rosuvastatin  40 mg, Zetia  10 mg.  Stop ASA on eliquis   CKD stage IV: checking labs today  Bifascicular block: Monitor.          Dispo: f/u Afib clinic ASAP  Signed, Olivia Pavy, PA-C

## 2024-07-20 NOTE — Telephone Encounter (Signed)
 Called patient about tachycardia. Added on to see Luis Pavy PA today at 1:15pm

## 2024-07-20 NOTE — Patient Instructions (Addendum)
 Medication Instructions:  STOP ASPIRIN  START METOPROLOL  TARTRATE 25 MG TAKE ONE TABLET TWICE DAILY START ELIQUIS  5 MG TAKE ONE TABLET TWICE DAILY *If you need a refill on your cardiac medications before your next appointment, please call your pharmacy*  Lab Work: TODAY- BMET,CBC If you have labs (blood work) drawn today and your tests are completely normal, you will receive your results only by: MyChart Message (if you have MyChart) OR A paper copy in the mail If you have any lab test that is abnormal or we need to change your treatment, we will call you to review the results.   Follow-Up: AFIB CLINIC

## 2024-07-21 ENCOUNTER — Ambulatory Visit: Payer: Self-pay | Admitting: Physician Assistant

## 2024-07-21 LAB — BASIC METABOLIC PANEL WITH GFR
BUN/Creatinine Ratio: 22 (ref 10–24)
BUN: 23 mg/dL (ref 8–27)
CO2: 19 mmol/L — AB (ref 20–29)
Calcium: 10.1 mg/dL (ref 8.6–10.2)
Chloride: 106 mmol/L (ref 96–106)
Creatinine, Ser: 1.04 mg/dL (ref 0.76–1.27)
Glucose: 104 mg/dL — AB (ref 70–99)
Potassium: 4.9 mmol/L (ref 3.5–5.2)
Sodium: 143 mmol/L (ref 134–144)
eGFR: 75 mL/min/1.73 (ref >59)

## 2024-07-21 LAB — CBC
Hematocrit: 38.8 % (ref 37.5–51.0)
Hemoglobin: 12.8 g/dL — ABNORMAL LOW (ref 13.0–17.7)
MCH: 32.7 pg (ref 26.6–33.0)
MCHC: 33 g/dL (ref 31.5–35.7)
MCV: 99 fL — ABNORMAL HIGH (ref 79–97)
Platelets: 177 x10E3/uL (ref 150–450)
RBC: 3.92 x10E6/uL — ABNORMAL LOW (ref 4.14–5.80)
RDW: 12.9 % (ref 11.6–15.4)
WBC: 8.9 x10E3/uL (ref 3.4–10.8)

## 2024-07-21 NOTE — Progress Notes (Signed)
 The patient has been notified of the result and verbalized understanding. All questions (if any) were answered.  Patient HR was 145 after a walk he took this morning. Then around 1:00 it was down to 71, after taking Eliquis  5 MG and Metoprolol  25 MG.

## 2024-07-22 ENCOUNTER — Ambulatory Visit (HOSPITAL_COMMUNITY)
Admission: RE | Admit: 2024-07-22 | Discharge: 2024-07-22 | Disposition: A | Discharge status: Home / Self Care | Source: Ambulatory Visit | Attending: Internal Medicine | Admitting: Internal Medicine

## 2024-07-22 VITALS — BP 160/64 | HR 102 | Ht 68.0 in | Wt 213.4 lb

## 2024-07-22 DIAGNOSIS — I4892 Unspecified atrial flutter: Secondary | ICD-10-CM | POA: Diagnosis not present

## 2024-07-22 DIAGNOSIS — I480 Paroxysmal atrial fibrillation: Secondary | ICD-10-CM | POA: Diagnosis not present

## 2024-07-22 DIAGNOSIS — D6869 Other thrombophilia: Secondary | ICD-10-CM | POA: Diagnosis not present

## 2024-07-22 DIAGNOSIS — I4891 Unspecified atrial fibrillation: Secondary | ICD-10-CM | POA: Diagnosis not present

## 2024-07-22 DIAGNOSIS — I4819 Other persistent atrial fibrillation: Secondary | ICD-10-CM

## 2024-07-22 NOTE — Progress Notes (Signed)
 Primary Care Physician: Tower, Laine LABOR, MD Primary Cardiologist: Wilbert Bihari, MD Electrophysiologist: None     Referring Physician: Parthenia Olivia HERO, PA-C     [name] is a 78 y.o. 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 Seen by Cardiology for rapid heart rate on 9/22 and noted to likely be in atrial flutter with RVR; stopped ASA and started on Eliquis  5 mg BID. Taking Lopressor  25 mg BID. Patient is on Eliquis  5 mg BID for a CHADS2VASC score of at least 4.  On evaluation today, patient is currently in atrial flutter. Patient began Eliquis  yesterday 9/23. He did not have cardiac awareness on Monday and does not today. He noticed the metoprolol  decreased the HR to 90s. He is able to exercise without issue.  Today, he denies symptoms of palpitations, chest pain, shortness of breath, orthopnea, PND, lower extremity edema, dizziness, presyncope, syncope, bleeding, or neurologic sequela. The patient is tolerating medications without difficulties and is otherwise without complaint today.    he has a BMI of Body mass index is 32.45 kg/m.SABRA Filed Weights   07/22/24 1314  Weight: 96.8 kg    Current Outpatient Medications  Medication Sig Dispense Refill   amLODipine  (NORVASC ) 5 MG tablet Take 1 tablet (5 mg total) by mouth daily. 90 tablet 3   apixaban  (ELIQUIS ) 5 MG TABS tablet Take 1 tablet (5 mg total) by mouth 2 (two) times daily. 180 tablet 3   apixaban  (ELIQUIS ) 5 MG TABS tablet Take 1 tablet (5 mg total) by mouth 2 (two) times daily. 60 tablet 0   ezetimibe  (ZETIA ) 10 MG tablet Take 1 tablet (10 mg total) by mouth daily. 90 tablet 3   levothyroxine  (SYNTHROID ) 75 MCG tablet Take 1 tablet (75 mcg total) by mouth daily. 90 tablet 0   losartan  (COZAAR ) 25 MG tablet Take 1  tablet (25 mg total) by mouth daily. 90 tablet 3   metoprolol  tartrate (LOPRESSOR ) 25 MG tablet Take 1 tablet (25 mg total) by mouth 2 (two) times daily. 180 tablet 3   Multiple Vitamin (MULTIVITAMIN WITH MINERALS) TABS tablet Take 1 tablet by mouth in the morning. Centrum Silver     rosuvastatin  (CRESTOR ) 40 MG tablet Take 1 tablet (40 mg total) by mouth daily. 90 tablet 3   No current facility-administered medications for this encounter.    Atrial Fibrillation Management history:  Previous antiarrhythmic drugs: none Previous cardioversions: none Previous ablations: none Anticoagulation history: Eliquis    ROS- All systems are reviewed and negative except as per the HPI above.  Physical Exam: BP (!) 160/64   Pulse (!) 102   Ht 5' 8 (1.727 m)   Wt 96.8 kg   BMI 32.45 kg/m   GEN: Well nourished, well developed in no acute distress NECK: No JVD; No carotid bruits CARDIAC: Irregularly irregular rate and rhythm, 2/6 systolic murmur; no rubs or gallops RESPIRATORY:  Clear to auscultation without rales, wheezing or rhonchi  ABDOMEN: Soft, non-tender, non-distended EXTREMITIES:  No edema; No deformity   EKG today demonstrates  Vent. rate 102 BPM PR interval * ms QRS duration 112 ms QT/QTcB 302/393 ms P-R-T axes * 87 -29 Atrial flutter with variable A-V block Nonspecific ST and T wave abnormality Abnormal ECG When compared with ECG of 20-Jul-2024 13:21, Atrial flutter has replaced Wide QRS  tachycardia  Echo 01/17/24 demonstrated   1. Left ventricular ejection fraction, by estimation, is 60 to 65%. The  left ventricle has normal function. The left ventricle has no regional  wall motion abnormalities. Left ventricular diastolic parameters are  indeterminate. The average left  ventricular global longitudinal strain is -19.2 %. The global longitudinal  strain is normal.   2. Right ventricular systolic function is normal. The right ventricular  size is normal.   3. Left atrial  size was mildly dilated.   4. The mitral valve is normal in structure. Trivial mitral valve  regurgitation. No evidence of mitral stenosis.   5. The aortic valve has been repaired/replaced. Aortic valve  regurgitation is not visualized. No aortic stenosis is present. There is a  23 mm Inspiris valve present in the aortic position. Procedure Date:  12/06/23. Echo findings are consistent with normal   structure and function of the aortic valve prosthesis. Aortic valve mean  gradient measures 10.0 mmHg. Aortic valve Vmax measures 2.25 m/s.   6. There is mild dilatation of the ascending aorta, measuring 41 mm.   7. The inferior vena cava is normal in size with greater than 50%  respiratory variability, suggesting right atrial pressure of 3 mmHg.    ASSESSMENT & PLAN CHA2DS2-VASc Score = 4  The patient's score is based upon: CHF History: 0 HTN History: 1 Diabetes History: 0 Stroke History: 0 Vascular Disease History: 1 Age Score: 2 Gender Score: 0       ASSESSMENT AND PLAN: Paroxysmal Atrial Flutter (ICD10:  I48.0) The patient's CHA2DS2-VASc score is 4, indicating a 4.8% annual risk of stroke.    Patient is currently in atrial flutter. Continue Lopressor  25 mg BID. Education provided about Afib/flutter. Discussion about triggers for Afib/flutter and medication treatments and ablation going forward if indicated. We discussed the procedure cardioversion to try to convert to NSR. We discussed the risks vs benefits of this procedure and how ultimately we cannot predict whether a patient will have early return of arrhythmia post procedure. After discussion, the patient wishes to proceed with cardioversion. Will schedule procedure after 3 weeks of uninterrupted anticoagulation. Labs drawn on 9/22. Rhythm monitoring device recommended.   Informed Consent   Shared Decision Making/Informed Consent The risks (stroke, cardiac arrhythmias rarely resulting in the need for a temporary or permanent  pacemaker, skin irritation or burns and complications associated with conscious sedation including aspiration, arrhythmia, respiratory failure and death), benefits (restoration of normal sinus rhythm) and alternatives of a direct current cardioversion were explained in detail to Mr. Hays and he agrees to proceed.       Secondary Hypercoagulable State (ICD10:  D68.69) The patient is at significant risk for stroke/thromboembolism based upon his CHA2DS2-VASc Score of 4.  Continue Apixaban  (Eliquis ).  Continue Eliquis  without interruption.    Follow up 2 weeks after DCCV.    Terra Pac, [facility]  Afib Clinic [address] [place], [place] [address] [phone]

## 2024-07-22 NOTE — H&P (View-Only) (Signed)
 Primary Care Physician: Tower, Laine LABOR, MD Primary Cardiologist: Wilbert Bihari, MD Electrophysiologist: None     Referring Physician: Parthenia Olivia HERO, PA-C     [name] is a 78 y.o. 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 Seen by Cardiology for rapid heart rate on 9/22 and noted to likely be in atrial flutter with RVR; stopped ASA and started on Eliquis  5 mg BID. Taking Lopressor  25 mg BID. Patient is on Eliquis  5 mg BID for a CHADS2VASC score of at least 4.  On evaluation today, patient is currently in atrial flutter. Patient began Eliquis  yesterday 9/23. He did not have cardiac awareness on Monday and does not today. He noticed the metoprolol  decreased the HR to 90s. He is able to exercise without issue.  Today, he denies symptoms of palpitations, chest pain, shortness of breath, orthopnea, PND, lower extremity edema, dizziness, presyncope, syncope, bleeding, or neurologic sequela. The patient is tolerating medications without difficulties and is otherwise without complaint today.    he has a BMI of Body mass index is 32.45 kg/m.SABRA Filed Weights   07/22/24 1314  Weight: 96.8 kg    Current Outpatient Medications  Medication Sig Dispense Refill   amLODipine  (NORVASC ) 5 MG tablet Take 1 tablet (5 mg total) by mouth daily. 90 tablet 3   apixaban  (ELIQUIS ) 5 MG TABS tablet Take 1 tablet (5 mg total) by mouth 2 (two) times daily. 180 tablet 3   apixaban  (ELIQUIS ) 5 MG TABS tablet Take 1 tablet (5 mg total) by mouth 2 (two) times daily. 60 tablet 0   ezetimibe  (ZETIA ) 10 MG tablet Take 1 tablet (10 mg total) by mouth daily. 90 tablet 3   levothyroxine  (SYNTHROID ) 75 MCG tablet Take 1 tablet (75 mcg total) by mouth daily. 90 tablet 0   losartan  (COZAAR ) 25 MG tablet Take 1  tablet (25 mg total) by mouth daily. 90 tablet 3   metoprolol  tartrate (LOPRESSOR ) 25 MG tablet Take 1 tablet (25 mg total) by mouth 2 (two) times daily. 180 tablet 3   Multiple Vitamin (MULTIVITAMIN WITH MINERALS) TABS tablet Take 1 tablet by mouth in the morning. Centrum Silver     rosuvastatin  (CRESTOR ) 40 MG tablet Take 1 tablet (40 mg total) by mouth daily. 90 tablet 3   No current facility-administered medications for this encounter.    Atrial Fibrillation Management history:  Previous antiarrhythmic drugs: none Previous cardioversions: none Previous ablations: none Anticoagulation history: Eliquis    ROS- All systems are reviewed and negative except as per the HPI above.  Physical Exam: BP (!) 160/64   Pulse (!) 102   Ht 5' 8 (1.727 m)   Wt 96.8 kg   BMI 32.45 kg/m   GEN: Well nourished, well developed in no acute distress NECK: No JVD; No carotid bruits CARDIAC: Irregularly irregular rate and rhythm, 2/6 systolic murmur; no rubs or gallops RESPIRATORY:  Clear to auscultation without rales, wheezing or rhonchi  ABDOMEN: Soft, non-tender, non-distended EXTREMITIES:  No edema; No deformity   EKG today demonstrates  Vent. rate 102 BPM PR interval * ms QRS duration 112 ms QT/QTcB 302/393 ms P-R-T axes * 87 -29 Atrial flutter with variable A-V block Nonspecific ST and T wave abnormality Abnormal ECG When compared with ECG of 20-Jul-2024 13:21, Atrial flutter has replaced Wide QRS  tachycardia  Echo 01/17/24 demonstrated   1. Left ventricular ejection fraction, by estimation, is 60 to 65%. The  left ventricle has normal function. The left ventricle has no regional  wall motion abnormalities. Left ventricular diastolic parameters are  indeterminate. The average left  ventricular global longitudinal strain is -19.2 %. The global longitudinal  strain is normal.   2. Right ventricular systolic function is normal. The right ventricular  size is normal.   3. Left atrial  size was mildly dilated.   4. The mitral valve is normal in structure. Trivial mitral valve  regurgitation. No evidence of mitral stenosis.   5. The aortic valve has been repaired/replaced. Aortic valve  regurgitation is not visualized. No aortic stenosis is present. There is a  23 mm Inspiris valve present in the aortic position. Procedure Date:  12/06/23. Echo findings are consistent with normal   structure and function of the aortic valve prosthesis. Aortic valve mean  gradient measures 10.0 mmHg. Aortic valve Vmax measures 2.25 m/s.   6. There is mild dilatation of the ascending aorta, measuring 41 mm.   7. The inferior vena cava is normal in size with greater than 50%  respiratory variability, suggesting right atrial pressure of 3 mmHg.    ASSESSMENT & PLAN CHA2DS2-VASc Score = 4  The patient's score is based upon: CHF History: 0 HTN History: 1 Diabetes History: 0 Stroke History: 0 Vascular Disease History: 1 Age Score: 2 Gender Score: 0       ASSESSMENT AND PLAN: Paroxysmal Atrial Flutter (ICD10:  I48.0) The patient's CHA2DS2-VASc score is 4, indicating a 4.8% annual risk of stroke.    Patient is currently in atrial flutter. Continue Lopressor  25 mg BID. Education provided about Afib/flutter. Discussion about triggers for Afib/flutter and medication treatments and ablation going forward if indicated. We discussed the procedure cardioversion to try to convert to NSR. We discussed the risks vs benefits of this procedure and how ultimately we cannot predict whether a patient will have early return of arrhythmia post procedure. After discussion, the patient wishes to proceed with cardioversion. Will schedule procedure after 3 weeks of uninterrupted anticoagulation. Labs drawn on 9/22. Rhythm monitoring device recommended.   Informed Consent   Shared Decision Making/Informed Consent The risks (stroke, cardiac arrhythmias rarely resulting in the need for a temporary or permanent  pacemaker, skin irritation or burns and complications associated with conscious sedation including aspiration, arrhythmia, respiratory failure and death), benefits (restoration of normal sinus rhythm) and alternatives of a direct current cardioversion were explained in detail to [name] and he agrees to proceed.       Secondary Hypercoagulable State (ICD10:  D68.69) The patient is at significant risk for stroke/thromboembolism based upon his CHA2DS2-VASc Score of 4.  Continue Apixaban  (Eliquis ).  Continue Eliquis  without interruption.    Follow up 2 weeks after DCCV.    Terra Pac, [facility]  Afib Clinic [address] [place], [place] [address] [phone]

## 2024-07-22 NOTE — Patient Instructions (Addendum)
 Kardia mobile -1 lead or Apple watch series 4 or newer  Cardioversion scheduled for: 08/12/24 Wednesday 8:00am   - Arrive at the [facility] ([address]  and check in with ADMITTING at 8:00am   - Do not eat or drink anything after midnight the night prior to your procedure.   - Take all your morning medication (except diabetic medications) with a sip of water prior to arrival.  - Do NOT miss any doses of your blood thinner - if you should miss a dose or take a dose more than 4 hours late -- please notify our office immediately.  - You will not be able to drive home after your procedure. Please ensure you have a responsible adult to drive you home. You will need someone with you for 24 hours post procedure.     - Expect to be in the procedural area approximately 2 hours.   - If you feel as if you go back into normal rhythm prior to scheduled cardioversion, please notify our office immediately.   If your procedure is canceled in the cardioversion suite you will be charged a cancellation fee.

## 2024-08-02 ENCOUNTER — Encounter: Payer: Self-pay | Admitting: Family Medicine

## 2024-08-03 MED ORDER — LEVOTHYROXINE SODIUM 75 MCG PO TABS: 75.0000 ug | ORAL_TABLET | Freq: Every day | ORAL | 2 refills | Status: AC

## 2024-08-11 NOTE — Progress Notes (Signed)
 Pt called for pre procedure instructions.  Voicemail left on identified number. Arrival time 0745 NPO after midnight explained Instructed to take am meds with sip of water and confirmed blood thinner consistency Instructed pt need for ride home tomorrow and have responsible adult with them for 24 hrs post procedure.

## 2024-08-11 NOTE — Anesthesia Preprocedure Evaluation (Signed)
 Anesthesia Evaluation  Patient identified by MRN, date of birth, ID band Patient awake    Reviewed: Allergy & Precautions, NPO status , Patient's Chart, lab work & pertinent test results, reviewed documented beta blocker date and time   History of Anesthesia Complications Negative for: history of anesthetic complications  Airway Mallampati: II  TM Distance: >3 FB Neck ROM: Full    Dental  (+) Dental Advisory Given   Pulmonary neg pulmonary ROS   breath sounds clear to auscultation       Cardiovascular hypertension, Pt. on medications and Pt. on home beta blockers + CAD and + CABG  + dysrhythmias Atrial Fibrillation + Valvular Problems/Murmurs (h/o AVR)  Rhythm:Regular Rate:Normal  12/2019 ECHO: EF 60-65%, normal LVF, normal RVF, trivial MR, s/p AVR   Neuro/Psych TIA   GI/Hepatic negative GI ROS, Neg liver ROS,,,  Endo/Other  Hypothyroidism    Renal/GU negative Renal ROS     Musculoskeletal   Abdominal   Peds  Hematology eliquis    Anesthesia Other Findings   Reproductive/Obstetrics                              Anesthesia Physical Anesthesia Plan  ASA: 3  Anesthesia Plan: General   Post-op Pain Management: Minimal or no pain anticipated   Induction: Intravenous  PONV Risk Score and Plan: 2 and Ondansetron  and Treatment may vary due to age or medical condition  Airway Management Planned: Natural Airway and Nasal Cannula  Additional Equipment: None  Intra-op Plan:   Post-operative Plan:   Informed Consent: I have reviewed the patients History and Physical, chart, labs and discussed the procedure including the risks, benefits and alternatives for the proposed anesthesia with the patient or authorized representative who has indicated his/her understanding and acceptance.     Dental advisory given  Plan Discussed with: CRNA and Surgeon  Anesthesia Plan Comments:           Anesthesia Quick Evaluation

## 2024-08-12 ENCOUNTER — Other Ambulatory Visit: Payer: Self-pay

## 2024-08-12 ENCOUNTER — Ambulatory Visit (HOSPITAL_COMMUNITY): Payer: Self-pay | Admitting: Anesthesiology

## 2024-08-12 ENCOUNTER — Ambulatory Visit (HOSPITAL_COMMUNITY)
Admission: RE | Admit: 2024-08-12 | Discharge: 2024-08-12 | Disposition: A | Discharge status: Home / Self Care | Attending: Internal Medicine | Admitting: Internal Medicine

## 2024-08-12 ENCOUNTER — Encounter (HOSPITAL_COMMUNITY)
Admission: RE | Disposition: A | Discharge status: Home / Self Care | Payer: Self-pay | Source: Home / Self Care | Attending: Internal Medicine

## 2024-08-12 DIAGNOSIS — E039 Hypothyroidism, unspecified: Secondary | ICD-10-CM | POA: Insufficient documentation

## 2024-08-12 DIAGNOSIS — Z79899 Other long term (current) drug therapy: Secondary | ICD-10-CM | POA: Insufficient documentation

## 2024-08-12 DIAGNOSIS — I4819 Other persistent atrial fibrillation: Secondary | ICD-10-CM | POA: Insufficient documentation

## 2024-08-12 DIAGNOSIS — I4891 Unspecified atrial fibrillation: Secondary | ICD-10-CM

## 2024-08-12 DIAGNOSIS — Z7901 Long term (current) use of anticoagulants: Secondary | ICD-10-CM | POA: Diagnosis not present

## 2024-08-12 DIAGNOSIS — I129 Hypertensive chronic kidney disease with stage 1 through stage 4 chronic kidney disease, or unspecified chronic kidney disease: Secondary | ICD-10-CM | POA: Insufficient documentation

## 2024-08-12 DIAGNOSIS — I452 Bifascicular block: Secondary | ICD-10-CM | POA: Insufficient documentation

## 2024-08-12 DIAGNOSIS — Z951 Presence of aortocoronary bypass graft: Secondary | ICD-10-CM | POA: Insufficient documentation

## 2024-08-12 DIAGNOSIS — I10 Essential (primary) hypertension: Secondary | ICD-10-CM

## 2024-08-12 DIAGNOSIS — I2510 Atherosclerotic heart disease of native coronary artery without angina pectoris: Secondary | ICD-10-CM | POA: Insufficient documentation

## 2024-08-12 DIAGNOSIS — I4892 Unspecified atrial flutter: Secondary | ICD-10-CM | POA: Diagnosis not present

## 2024-08-12 DIAGNOSIS — E785 Hyperlipidemia, unspecified: Secondary | ICD-10-CM | POA: Diagnosis not present

## 2024-08-12 DIAGNOSIS — D6869 Other thrombophilia: Secondary | ICD-10-CM | POA: Insufficient documentation

## 2024-08-12 DIAGNOSIS — N189 Chronic kidney disease, unspecified: Secondary | ICD-10-CM | POA: Diagnosis not present

## 2024-08-12 HISTORY — PX: CARDIOVERSION: EP1203

## 2024-08-12 SURGERY — CARDIOVERSION (CATH LAB)
Anesthesia: General

## 2024-08-12 MED ORDER — LIDOCAINE 2% (20 MG/ML) 5 ML SYRINGE
INTRAMUSCULAR | Status: DC | PRN
  Administered 2024-08-12: 20 mg via INTRAVENOUS

## 2024-08-12 MED ORDER — PROPOFOL 10 MG/ML IV BOLUS
INTRAVENOUS | Status: DC | PRN
Start: 2024-08-12 — End: 2024-08-12
  Administered 2024-08-12: 100 mg via INTRAVENOUS

## 2024-08-12 MED ORDER — SODIUM CHLORIDE 0.9 % IV SOLN: INTRAVENOUS | Status: DC

## 2024-08-12 SURGICAL SUPPLY — 1 items: PAD DEFIB RADIO PHYSIO CONN (PAD) ×2 IMPLANT

## 2024-08-12 NOTE — Discharge Instructions (Signed)

## 2024-08-12 NOTE — Anesthesia Postprocedure Evaluation (Signed)
 Anesthesia Post Note  Patient: [name]  Procedure(s) Performed: CARDIOVERSION     Patient location during evaluation: Cath Lab Anesthesia Type: General Level of consciousness: awake and alert, oriented and patient cooperative Pain management: pain level controlled Vital Signs Assessment: post-procedure vital signs reviewed and stable Respiratory status: spontaneous breathing, nonlabored ventilation and respiratory function stable Cardiovascular status: blood pressure returned to baseline and stable Postop Assessment: no apparent nausea or vomiting Anesthetic complications: no   There were no known notable events for this encounter.  Last Vitals:  Vitals:   08/12/24 1001 08/12/24 1011  BP: (!) 146/82 (!) 141/85  Pulse: 87 89  Resp: 18 18  Temp:    SpO2: 95% 96%    Last Pain:  Vitals:   08/12/24 1011  TempSrc:   PainSc: 0-No pain                 Ginia Rudell,E. Coleby Yett

## 2024-08-12 NOTE — Transfer of Care (Signed)
 Immediate Anesthesia Transfer of Care Note  Patient: [name]  Procedure(s) Performed: CARDIOVERSION  Patient Location: PACU and Cath Lab  Anesthesia Type:General  Level of Consciousness: awake  Airway & Oxygen Therapy: Patient Spontanous Breathing and Patient connected to nasal cannula oxygen  Post-op Assessment: Report given to RN and Post -op Vital signs reviewed and stable  Post vital signs: Reviewed and stable  Last Vitals:  Vitals Value Taken Time  BP 142/82   Temp    Pulse 90 08/12/24 09:41  Resp 25 08/12/24 09:41  SpO2 93 % 08/12/24 09:41  Vitals shown include unfiled device data.  Last Pain:  Vitals:   08/12/24 0806  TempSrc:   PainSc: 0-No pain         Complications: There were no known notable events for this encounter.

## 2024-08-12 NOTE — CV Procedure (Signed)
    CARDIOVERSION NOTE  Procedure: Electrical Cardioversion Indications:  Atrial Flutter  Procedure Details:  Consent: Risks of procedure as well as the alternatives and risks of each were explained to the (patient/caregiver).  Consent for procedure obtained.  Time Out: Verified patient identification, verified procedure, site/side was marked, verified correct patient position, special equipment/implants available, medications/allergies/relevent history reviewed, required imaging and test results available.  Performed  Patient placed on cardiac monitor, pulse oximetry, supplemental oxygen as necessary.  Sedation given: propofol  per anesthesia Pacer pads placed anterior and posterior chest.  Cardioverted 1 time(s).  Cardioverted at 200J biphasic.  Impression: Findings: Post procedure EKG shows: NSR Complications: None Patient did tolerate procedure well.  Plan: Successful DCCV with a single 200J biphasic shock to NSR.  Time Spent Directly with the Patient:  30 minutes   Vinie KYM Maxcy, MD, [facility], FNLA, FACP  [place]  [facility]  Medical Director of the Advanced Lipid Disorders &  Cardiovascular Risk Reduction Clinic Diplomate of the American Board of Clinical Lipidology Attending Cardiologist  Direct Dial: [phone]  Fax: [phone]  Website:  [email or web address] Vinie BROCKS Jamal Haskin 08/12/2024, 9:40 AM

## 2024-08-12 NOTE — Interval H&P Note (Signed)
 History and Physical Interval Note:  08/12/2024 8:50 AM  [name]  has presented today for surgery, with the diagnosis of afib.  The various methods of treatment have been discussed with the patient and family. After consideration of risks, benefits and other options for treatment, the patient has consented to  Procedure(s): CARDIOVERSION (N/A) as a surgical intervention.  The patient's history has been reviewed, patient examined, no change in status, stable for surgery.  I have reviewed the patient's chart and labs.  Questions were answered to the patient's satisfaction.     [name]

## 2024-08-13 ENCOUNTER — Encounter (HOSPITAL_COMMUNITY): Payer: Self-pay | Admitting: Internal Medicine

## 2024-08-26 ENCOUNTER — Encounter (HOSPITAL_COMMUNITY): Payer: Self-pay | Admitting: Internal Medicine

## 2024-08-26 ENCOUNTER — Ambulatory Visit (HOSPITAL_COMMUNITY)
Admission: RE | Admit: 2024-08-26 | Discharge: 2024-08-26 | Disposition: A | Discharge status: Home / Self Care | Source: Ambulatory Visit | Attending: Internal Medicine | Admitting: Internal Medicine

## 2024-08-26 VITALS — BP 148/82 | HR 124 | Ht 68.0 in | Wt 218.4 lb

## 2024-08-26 DIAGNOSIS — I4892 Unspecified atrial flutter: Secondary | ICD-10-CM | POA: Diagnosis not present

## 2024-08-26 DIAGNOSIS — I480 Paroxysmal atrial fibrillation: Secondary | ICD-10-CM

## 2024-08-26 DIAGNOSIS — I4819 Other persistent atrial fibrillation: Secondary | ICD-10-CM | POA: Diagnosis not present

## 2024-08-26 DIAGNOSIS — D6869 Other thrombophilia: Secondary | ICD-10-CM

## 2024-08-26 DIAGNOSIS — I4891 Unspecified atrial fibrillation: Secondary | ICD-10-CM

## 2024-08-26 MED ORDER — AMIODARONE HCL 200 MG PO TABS: ORAL_TABLET | ORAL | 3 refills | Status: DC

## 2024-08-26 NOTE — Progress Notes (Signed)
 Primary Care Physician: Tower, Laine LABOR, MD Primary Cardiologist: Wilbert Bihari, MD Electrophysiologist: None     Referring Physician: Parthenia Olivia HERO, PA-C     [name] is a 78 y.o. 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 Seen by Cardiology for rapid heart rate on 9/22 and noted to likely be in atrial flutter with RVR; stopped ASA and started on Eliquis  5 mg BID. Taking Lopressor  25 mg BID. Patient is on Eliquis  5 mg BID for a CHADS2VASC score of at least 4.  On follow up 08/26/24, patient is currently in atrial flutter with RVR. S/p successful DCCV on 08/12/24. No missed doses of Eliquis .  Today, he denies symptoms of palpitations, chest pain, shortness of breath, orthopnea, PND, lower extremity edema, dizziness, presyncope, syncope, bleeding, or neurologic sequela. The patient is tolerating medications without difficulties and is otherwise without complaint today.    he has a BMI of Body mass index is 33.21 kg/m.SABRA Filed Weights   08/26/24 1509  Weight: 99.1 kg     Current Outpatient Medications  Medication Sig Dispense Refill   amiodarone  (PACERONE ) 200 MG tablet Take 1 tablet (200 mg total) by mouth 2 (two) times daily for 30 days, THEN 1 tablet (200 mg total) daily. 60 tablet 3   amLODipine  (NORVASC ) 5 MG tablet Take 1 tablet (5 mg total) by mouth daily. 90 tablet 3   apixaban  (ELIQUIS ) 5 MG TABS tablet Take 1 tablet (5 mg total) by mouth 2 (two) times daily. 180 tablet 3   apixaban  (ELIQUIS ) 5 MG TABS tablet Take 1 tablet (5 mg total) by mouth 2 (two) times daily. 60 tablet 0   diclofenac  Sodium (VOLTAREN ) 1 % GEL Apply 2 g topically daily as needed (pain).     doxylamine, Sleep, (UNISOM) 25 MG tablet Take 25 mg by mouth at bedtime as needed for sleep.      ezetimibe  (ZETIA ) 10 MG tablet Take 1 tablet (10 mg total) by mouth daily. 90 tablet 3   levothyroxine  (SYNTHROID ) 75 MCG tablet Take 1 tablet (75 mcg total) by mouth daily. 90 tablet 2   losartan  (COZAAR ) 25 MG tablet Take 1 tablet (25 mg total) by mouth daily. 90 tablet 3   metoprolol  tartrate (LOPRESSOR ) 25 MG tablet Take 1 tablet (25 mg total) by mouth 2 (two) times daily. 180 tablet 3   Multiple Vitamin (MULTIVITAMIN WITH MINERALS) TABS tablet Take 1 tablet by mouth in the morning. Centrum Silver     rosuvastatin  (CRESTOR ) 40 MG tablet Take 1 tablet (40 mg total) by mouth daily. 90 tablet 3   No current facility-administered medications for this encounter.    Atrial Fibrillation Management history:  Previous antiarrhythmic drugs: none Previous cardioversions: 08/12/24 Previous ablations: none Anticoagulation history: Eliquis    ROS- All systems are reviewed and negative except as per the HPI above.  Physical Exam: BP (!) 148/82   Pulse (!) 124   Ht 5' 8 (1.727 m)   Wt 99.1 kg   BMI 33.21 kg/m   GEN- The patient is well appearing, alert and oriented x 3 today.   Neck - no JVD or carotid bruit noted Lungs- Clear to ausculation bilaterally, normal work of breathing Heart- Irregular tachycardic rate and rhythm, no murmurs, rubs or gallops, PMI not laterally displaced Extremities- no clubbing, cyanosis,  or edema Skin - no rash or ecchymosis noted   EKG today demonstrates  Vent. rate 124 BPM PR interval * ms QRS duration 112 ms QT/QTcB 344/494 ms P-R-T axes * 49 185 Atrial flutter with frequent Premature ventricular complexes Incomplete left bundle branch block Left ventricular hypertrophy with repolarization abnormality ( Cornell product ) Abnormal ECG When compared with ECG of 12-Aug-2024 09:44, Atrial flutter has replaced sinus rhythm Confirmed by Terra Pac [phone]) on 08/26/2024 3:40:47 PM  Echo 01/17/24 demonstrated   1. Left ventricular ejection fraction, by  estimation, is 60 to 65%. The  left ventricle has normal function. The left ventricle has no regional  wall motion abnormalities. Left ventricular diastolic parameters are  indeterminate. The average left  ventricular global longitudinal strain is -19.2 %. The global longitudinal  strain is normal.   2. Right ventricular systolic function is normal. The right ventricular  size is normal.   3. Left atrial size was mildly dilated.   4. The mitral valve is normal in structure. Trivial mitral valve  regurgitation. No evidence of mitral stenosis.   5. The aortic valve has been repaired/replaced. Aortic valve  regurgitation is not visualized. No aortic stenosis is present. There is a  23 mm Inspiris valve present in the aortic position. Procedure Date:  12/06/23. Echo findings are consistent with normal   structure and function of the aortic valve prosthesis. Aortic valve mean  gradient measures 10.0 mmHg. Aortic valve Vmax measures 2.25 m/s.   6. There is mild dilatation of the ascending aorta, measuring 41 mm.   7. The inferior vena cava is normal in size with greater than 50%  respiratory variability, suggesting right atrial pressure of 3 mmHg.    ASSESSMENT & PLAN CHA2DS2-VASc Score = 4  The patient's score is based upon: CHF History: 0 HTN History: 1 Diabetes History: 0 Stroke History: 0 Vascular Disease History: 1 Age Score: 2 Gender Score: 0       ASSESSMENT AND PLAN: Paroxysmal Atrial Flutter (ICD10:  I48.0) The patient's CHA2DS2-VASc score is 4, indicating a 4.8% annual risk of stroke.   S/p DCCV on 08/12/24.  Patient is currently in atrial flutter with RVR. He has had ERAF following successful cardioversion. We had a discussion about medication treatments and ablation in detail. We discussed potential options such as Multaq, Tikosyn, and amiodarone . We talked about the monitoring required for these medications, hospital admission for Tikosyn, and potential adverse effects.  We also discussed pulse field ablation as an option in the form of intervention. We talked about the procedure and method of treatment. After discussion, patient is interested in speaking with EP regarding potential for Afib ablation. He would like to begin amiodarone  as a bridge to ablation. Will begin amiodarone  200 mg BID x 4 weeks then transition to 200 mg once daily. Refer to EP to discuss ablation.  Secondary Hypercoagulable State (ICD10:  D68.69) The patient is at significant risk for stroke/thromboembolism based upon his CHA2DS2-VASc Score of 4.  Continue Apixaban  (Eliquis ).  Continue Eliquis . No missed doses.     Follow up 3 weeks Afib clinic to determine if needs cardioversion.   Terra Pac, [facility]  Afib Clinic [address] [place], [place] [address] [phone]

## 2024-08-26 NOTE — Patient Instructions (Signed)
 Start Amiodarone  200 mg twice a day for 30 days then decrease to 200 mg once a day  Do not miss any doses of Eliquis 

## 2024-09-15 ENCOUNTER — Ambulatory Visit (HOSPITAL_COMMUNITY)
Admission: RE | Admit: 2024-09-15 | Discharge: 2024-09-15 | Disposition: A | Discharge status: Home / Self Care | Source: Ambulatory Visit | Attending: Internal Medicine | Admitting: Internal Medicine

## 2024-09-15 VITALS — BP 160/72 | HR 99 | Ht 68.0 in | Wt 220.6 lb

## 2024-09-15 DIAGNOSIS — D6869 Other thrombophilia: Secondary | ICD-10-CM | POA: Diagnosis not present

## 2024-09-15 DIAGNOSIS — I4819 Other persistent atrial fibrillation: Secondary | ICD-10-CM

## 2024-09-15 DIAGNOSIS — I4891 Unspecified atrial fibrillation: Secondary | ICD-10-CM

## 2024-09-15 DIAGNOSIS — I4892 Unspecified atrial flutter: Secondary | ICD-10-CM | POA: Diagnosis not present

## 2024-09-15 DIAGNOSIS — Z79899 Other long term (current) drug therapy: Secondary | ICD-10-CM

## 2024-09-15 DIAGNOSIS — Z5181 Encounter for therapeutic drug level monitoring: Secondary | ICD-10-CM

## 2024-09-15 NOTE — H&P (View-Only) (Signed)
 Primary Care Physician: Tower, Laine LABOR, MD Primary Cardiologist: Wilbert Bihari, MD Electrophysiologist: None     Referring Physician: Parthenia Olivia HERO, PA-C     [name] is a 78 y.o. 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 Seen by Cardiology for rapid heart rate on 9/22 and noted to likely be in atrial flutter with RVR; stopped ASA and started on Eliquis  5 mg BID. Taking Lopressor  25 mg BID. Patient is on Eliquis  5 mg BID for a CHADS2VASC score of at least 4. S/p successful DCCV on 08/12/24 with ERAF.   On follow-up 09/15/2024, patient is currently in atrial flutter.  He began amiodarone  load after last office visit due to ERAF following cardioversion in October.  He is tolerating amiodarone  without issue and has not missed any doses of Eliquis .  Today, he denies symptoms of palpitations, chest pain, shortness of breath, orthopnea, PND, lower extremity edema, dizziness, presyncope, syncope, bleeding, or neurologic sequela. The patient is tolerating medications without difficulties and is otherwise without complaint today.    he has a BMI of Body mass index is 33.54 kg/m.SABRA Filed Weights   09/15/24 1455  Weight: 100.1 kg      Current Outpatient Medications  Medication Sig Dispense Refill   amiodarone  (PACERONE ) 200 MG tablet Take 1 tablet (200 mg total) by mouth 2 (two) times daily for 30 days, THEN 1 tablet (200 mg total) daily. 60 tablet 3   amLODipine  (NORVASC ) 5 MG tablet Take 1 tablet (5 mg total) by mouth daily. 90 tablet 3   apixaban  (ELIQUIS ) 5 MG TABS tablet Take 1 tablet (5 mg total) by mouth 2 (two) times daily. 180 tablet 3   apixaban  (ELIQUIS ) 5 MG TABS tablet Take 1 tablet (5 mg total) by mouth 2 (two) times daily. 60 tablet 0   diclofenac  Sodium  (VOLTAREN ) 1 % GEL Apply 2 g topically daily as needed (pain).     doxylamine, Sleep, (UNISOM) 25 MG tablet Take 25 mg by mouth at bedtime as needed for sleep.     ezetimibe  (ZETIA ) 10 MG tablet Take 1 tablet (10 mg total) by mouth daily. 90 tablet 3   levothyroxine  (SYNTHROID ) 75 MCG tablet Take 1 tablet (75 mcg total) by mouth daily. 90 tablet 2   losartan  (COZAAR ) 25 MG tablet Take 1 tablet (25 mg total) by mouth daily. 90 tablet 3   metoprolol  tartrate (LOPRESSOR ) 25 MG tablet Take 1 tablet (25 mg total) by mouth 2 (two) times daily. 180 tablet 3   Multiple Vitamin (MULTIVITAMIN WITH MINERALS) TABS tablet Take 1 tablet by mouth in the morning. Centrum Silver     rosuvastatin  (CRESTOR ) 40 MG tablet Take 1 tablet (40 mg total) by mouth daily. 90 tablet 3   No current facility-administered medications for this encounter.    Atrial Fibrillation Management history:  Previous antiarrhythmic drugs: amiodarone  Previous cardioversions: 08/12/24 Previous ablations: none Anticoagulation history: Eliquis    ROS- All systems are reviewed and negative except as per the HPI above.  Physical Exam: BP (!) 160/72   Pulse 99   Ht 5' 8 (1.727 m)   Wt 100.1 kg   BMI 33.54 kg/m   GEN- The patient is well appearing, alert and oriented x 3 today.   Neck - no JVD or carotid bruit noted Lungs- Clear  to ausculation bilaterally, normal work of breathing Heart- Irregular rate and rhythm, no murmurs, rubs or gallops, PMI not laterally displaced Extremities- no clubbing, cyanosis, or edema Skin - no rash or ecchymosis noted   EKG today demonstrates  Vent. rate 99 BPM PR interval * ms QRS duration 150 ms QT/QTcB 410/526 ms P-R-T axes * 26 97 Likely atrial flutter with premature ventricular or aberrantly conducted complexes Left bundle branch block Abnormal ECG When compared with ECG of 26-Aug-2024 15:11, Previous ECG is present  Echo 01/17/24 demonstrated   1. Left ventricular ejection  fraction, by estimation, is 60 to 65%. The  left ventricle has normal function. The left ventricle has no regional  wall motion abnormalities. Left ventricular diastolic parameters are  indeterminate. The average left  ventricular global longitudinal strain is -19.2 %. The global longitudinal  strain is normal.   2. Right ventricular systolic function is normal. The right ventricular  size is normal.   3. Left atrial size was mildly dilated.   4. The mitral valve is normal in structure. Trivial mitral valve  regurgitation. No evidence of mitral stenosis.   5. The aortic valve has been repaired/replaced. Aortic valve  regurgitation is not visualized. No aortic stenosis is present. There is a  23 mm Inspiris valve present in the aortic position. Procedure Date:  12/06/23. Echo findings are consistent with normal   structure and function of the aortic valve prosthesis. Aortic valve mean  gradient measures 10.0 mmHg. Aortic valve Vmax measures 2.25 m/s.   6. There is mild dilatation of the ascending aorta, measuring 41 mm.   7. The inferior vena cava is normal in size with greater than 50%  respiratory variability, suggesting right atrial pressure of 3 mmHg.    ASSESSMENT & PLAN CHA2DS2-VASc Score = 4  The patient's score is based upon: CHF History: 0 HTN History: 1 Diabetes History: 0 Stroke History: 0 Vascular Disease History: 1 Age Score: 2 Gender Score: 0       ASSESSMENT AND PLAN: Persistent Atrial Flutter (ICD10:  I48.0) The patient's CHA2DS2-VASc score is 4, indicating a 4.8% annual risk of stroke.   S/p DCCV on 08/12/24.  Patient is currently in atrial flutter  He began amiodarone  load at last office visit.  We discussed repeat cardioversion to try and restore sinus rhythm and hope to maintain sinus rhythm after cardioversion having started amiodarone . We discussed the procedure cardioversion to try to convert to NSR. We discussed the risks vs benefits of this procedure and  how ultimately we cannot predict whether a patient will have early return of arrhythmia post procedure. After discussion, the patient wishes to proceed with cardioversion. Labs drawn today.   Informed Consent   Shared Decision Making/Informed Consent The risks (stroke, cardiac arrhythmias rarely resulting in the need for a temporary or permanent pacemaker, skin irritation or burns and complications associated with conscious sedation including aspiration, arrhythmia, respiratory failure and death), benefits (restoration of normal sinus rhythm) and alternatives of a direct current cardioversion were explained in detail to Mr. Starner and he agrees to proceed.        High risk medication monitoring (ICD10: U5195107) Patient requires ongoing monitoring for anti-arrhythmic medication which has the potential to cause life threatening arrhythmias or AV block. Qtc stable. Continue amiodarone  load as prescribed.  Secondary Hypercoagulable State (ICD10:  D68.69) The patient is at significant risk for stroke/thromboembolism based upon his CHA2DS2-VASc Score of 4.  Continue Apixaban  (Eliquis ).  No missed doses of Eliquis .  Follow up after cardioversion with Dr. Inocencio as scheduled.   Terra Pac, [facility]  Afib Clinic [address] [place], [place] [address] [phone]

## 2024-09-15 NOTE — Patient Instructions (Addendum)
 11/28 --- Decrease Amiodarone  to 200mg  once a day  Cardioversion scheduled for: Monday December 1st   - Arrive at the [facility] ([address]  and check in with ADMITTING at 7:30 am   - Do not eat or drink anything after midnight the night prior to your procedure.   - Take all your morning medication (except diabetic medications) with a sip of water prior to arrival.  - Do NOT miss any doses of your blood thinner - if you should miss a dose or take a dose more than 4 hours late -- please notify our office immediately.  - You will not be able to drive home after your procedure. Please ensure you have a responsible adult to drive you home. You will need someone with you for 24 hours post procedure.     - Expect to be in the procedural area approximately 2 hours.   - If you feel as if you go back into normal rhythm prior to scheduled cardioversion, please notify our office immediately.   If your procedure is canceled in the cardioversion suite you will be charged a cancellation fee.

## 2024-09-15 NOTE — Progress Notes (Addendum)
 Primary Care Physician: Tower, Laine LABOR, MD Primary Cardiologist: Wilbert Bihari, MD Electrophysiologist: None     Referring Physician: Parthenia Olivia HERO, PA-C     [name] is a 78 y.o. 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 Seen by Cardiology for rapid heart rate on 9/22 and noted to likely be in atrial flutter with RVR; stopped ASA and started on Eliquis  5 mg BID. Taking Lopressor  25 mg BID. Patient is on Eliquis  5 mg BID for a CHADS2VASC score of at least 4. S/p successful DCCV on 08/12/24 with ERAF.   On follow-up 09/15/2024, patient is currently in atrial flutter.  He began amiodarone  load after last office visit due to ERAF following cardioversion in October.  He is tolerating amiodarone  without issue and has not missed any doses of Eliquis .  Today, he denies symptoms of palpitations, chest pain, shortness of breath, orthopnea, PND, lower extremity edema, dizziness, presyncope, syncope, bleeding, or neurologic sequela. The patient is tolerating medications without difficulties and is otherwise without complaint today.    he has a BMI of Body mass index is 33.54 kg/m.SABRA Filed Weights   09/15/24 1455  Weight: 100.1 kg      Current Outpatient Medications  Medication Sig Dispense Refill   amiodarone  (PACERONE ) 200 MG tablet Take 1 tablet (200 mg total) by mouth 2 (two) times daily for 30 days, THEN 1 tablet (200 mg total) daily. 60 tablet 3   amLODipine  (NORVASC ) 5 MG tablet Take 1 tablet (5 mg total) by mouth daily. 90 tablet 3   apixaban  (ELIQUIS ) 5 MG TABS tablet Take 1 tablet (5 mg total) by mouth 2 (two) times daily. 180 tablet 3   apixaban  (ELIQUIS ) 5 MG TABS tablet Take 1 tablet (5 mg total) by mouth 2 (two) times daily. 60 tablet 0   diclofenac  Sodium  (VOLTAREN ) 1 % GEL Apply 2 g topically daily as needed (pain).     doxylamine, Sleep, (UNISOM) 25 MG tablet Take 25 mg by mouth at bedtime as needed for sleep.     ezetimibe  (ZETIA ) 10 MG tablet Take 1 tablet (10 mg total) by mouth daily. 90 tablet 3   levothyroxine  (SYNTHROID ) 75 MCG tablet Take 1 tablet (75 mcg total) by mouth daily. 90 tablet 2   losartan  (COZAAR ) 25 MG tablet Take 1 tablet (25 mg total) by mouth daily. 90 tablet 3   metoprolol  tartrate (LOPRESSOR ) 25 MG tablet Take 1 tablet (25 mg total) by mouth 2 (two) times daily. 180 tablet 3   Multiple Vitamin (MULTIVITAMIN WITH MINERALS) TABS tablet Take 1 tablet by mouth in the morning. Centrum Silver     rosuvastatin  (CRESTOR ) 40 MG tablet Take 1 tablet (40 mg total) by mouth daily. 90 tablet 3   No current facility-administered medications for this encounter.    Atrial Fibrillation Management history:  Previous antiarrhythmic drugs: amiodarone  Previous cardioversions: 08/12/24 Previous ablations: none Anticoagulation history: Eliquis    ROS- All systems are reviewed and negative except as per the HPI above.  Physical Exam: BP (!) 160/72   Pulse 99   Ht 5' 8 (1.727 m)   Wt 100.1 kg   BMI 33.54 kg/m   GEN- The patient is well appearing, alert and oriented x 3 today.   Neck - no JVD or carotid bruit noted Lungs- Clear  to ausculation bilaterally, normal work of breathing Heart- Irregular rate and rhythm, no murmurs, rubs or gallops, PMI not laterally displaced Extremities- no clubbing, cyanosis, or edema Skin - no rash or ecchymosis noted   EKG today demonstrates  Vent. rate 99 BPM PR interval * ms QRS duration 150 ms QT/QTcB 410/526 ms P-R-T axes * 26 97 Likely atrial flutter with premature ventricular or aberrantly conducted complexes Left bundle branch block Abnormal ECG When compared with ECG of 26-Aug-2024 15:11, Previous ECG is present  Echo 01/17/24 demonstrated   1. Left ventricular ejection  fraction, by estimation, is 60 to 65%. The  left ventricle has normal function. The left ventricle has no regional  wall motion abnormalities. Left ventricular diastolic parameters are  indeterminate. The average left  ventricular global longitudinal strain is -19.2 %. The global longitudinal  strain is normal.   2. Right ventricular systolic function is normal. The right ventricular  size is normal.   3. Left atrial size was mildly dilated.   4. The mitral valve is normal in structure. Trivial mitral valve  regurgitation. No evidence of mitral stenosis.   5. The aortic valve has been repaired/replaced. Aortic valve  regurgitation is not visualized. No aortic stenosis is present. There is a  23 mm Inspiris valve present in the aortic position. Procedure Date:  12/06/23. Echo findings are consistent with normal   structure and function of the aortic valve prosthesis. Aortic valve mean  gradient measures 10.0 mmHg. Aortic valve Vmax measures 2.25 m/s.   6. There is mild dilatation of the ascending aorta, measuring 41 mm.   7. The inferior vena cava is normal in size with greater than 50%  respiratory variability, suggesting right atrial pressure of 3 mmHg.    ASSESSMENT & PLAN CHA2DS2-VASc Score = 4  The patient's score is based upon: CHF History: 0 HTN History: 1 Diabetes History: 0 Stroke History: 0 Vascular Disease History: 1 Age Score: 2 Gender Score: 0       ASSESSMENT AND PLAN: Persistent Atrial Flutter (ICD10:  I48.0) The patient's CHA2DS2-VASc score is 4, indicating a 4.8% annual risk of stroke.   S/p DCCV on 08/12/24.  Patient is currently in atrial flutter  He began amiodarone  load at last office visit.  We discussed repeat cardioversion to try and restore sinus rhythm and hope to maintain sinus rhythm after cardioversion having started amiodarone . We discussed the procedure cardioversion to try to convert to NSR. We discussed the risks vs benefits of this procedure and  how ultimately we cannot predict whether a patient will have early return of arrhythmia post procedure. After discussion, the patient wishes to proceed with cardioversion. Labs drawn today.   Informed Consent   Shared Decision Making/Informed Consent The risks (stroke, cardiac arrhythmias rarely resulting in the need for a temporary or permanent pacemaker, skin irritation or burns and complications associated with conscious sedation including aspiration, arrhythmia, respiratory failure and death), benefits (restoration of normal sinus rhythm) and alternatives of a direct current cardioversion were explained in detail to Mr. Starner and he agrees to proceed.        High risk medication monitoring (ICD10: U5195107) Patient requires ongoing monitoring for anti-arrhythmic medication which has the potential to cause life threatening arrhythmias or AV block. Qtc stable. Continue amiodarone  load as prescribed.  Secondary Hypercoagulable State (ICD10:  D68.69) The patient is at significant risk for stroke/thromboembolism based upon his CHA2DS2-VASc Score of 4.  Continue Apixaban  (Eliquis ).  No missed doses of Eliquis .  Follow up after cardioversion with Dr. Inocencio as scheduled.   Terra Pac, [facility]  Afib Clinic [address] [place], [place] [address] [phone]

## 2024-09-16 ENCOUNTER — Ambulatory Visit (HOSPITAL_COMMUNITY): Payer: Self-pay | Admitting: Internal Medicine

## 2024-09-16 LAB — CBC
Hematocrit: 36.8 % — ABNORMAL LOW (ref 37.5–51.0)
Hemoglobin: 12.3 g/dL — ABNORMAL LOW (ref 13.0–17.7)
MCH: 32.7 pg (ref 26.6–33.0)
MCHC: 33.4 g/dL (ref 31.5–35.7)
MCV: 98 fL — ABNORMAL HIGH (ref 79–97)
Platelets: 112 x10E3/uL — ABNORMAL LOW (ref 150–450)
RBC: 3.76 x10E6/uL — ABNORMAL LOW (ref 4.14–5.80)
RDW: 12.6 % (ref 11.6–15.4)
WBC: 6 x10E3/uL (ref 3.4–10.8)

## 2024-09-16 LAB — BASIC METABOLIC PANEL WITH GFR
BUN/Creatinine Ratio: 17 (ref 10–24)
BUN: 18 mg/dL (ref 8–27)
CO2: 22 mmol/L (ref 20–29)
Calcium: 9.7 mg/dL (ref 8.6–10.2)
Chloride: 100 mmol/L (ref 96–106)
Creatinine, Ser: 1.03 mg/dL (ref 0.76–1.27)
Glucose: 93 mg/dL (ref 70–99)
Potassium: 4.2 mmol/L (ref 3.5–5.2)
Sodium: 141 mmol/L (ref 134–144)
eGFR: 75 mL/min/1.73 (ref >59)

## 2024-09-20 ENCOUNTER — Other Ambulatory Visit (HOSPITAL_COMMUNITY): Payer: Self-pay | Admitting: Internal Medicine

## 2024-09-25 NOTE — Progress Notes (Addendum)
 Called patient with pre-procedure instructions for tomorrow.   Patient informed of:   Time to arrive for procedure (0700). Remain NPO past midnight.  Must have a ride home and a responsible adult to remain with them for 24 hours post procedure. Instructed to take am meds with sip of water and confirmed blood thinner consistency.  Patient reports understanding and no other questions at this time.

## 2024-09-27 NOTE — Anesthesia Preprocedure Evaluation (Signed)
 Anesthesia Evaluation  Patient identified by MRN, date of birth, ID band Patient awake    Reviewed: Allergy & Precautions, NPO status , Patient's Chart, lab work & pertinent test results  Airway Mallampati: II  TM Distance: >3 FB Neck ROM: Full    Dental no notable dental hx. (+) Teeth Intact, Dental Advisory Given   Pulmonary neg pulmonary ROS   Pulmonary exam normal breath sounds clear to auscultation       Cardiovascular hypertension, + CAD and + CABG  Normal cardiovascular exam+ dysrhythmias Atrial Fibrillation + Valvular Problems/Murmurs (s/p AVR) AS  Rhythm:Irregular Rate:Normal  TTE 2025 1. Left ventricular ejection fraction, by estimation, is 60 to 65%. The  left ventricle has normal function. The left ventricle has no regional  wall motion abnormalities. Left ventricular diastolic parameters are  indeterminate. The average left  ventricular global longitudinal strain is -19.2 %. The global longitudinal  strain is normal.   2. Right ventricular systolic function is normal. The right ventricular  size is normal.   3. Left atrial size was mildly dilated.   4. The mitral valve is normal in structure. Trivial mitral valve  regurgitation. No evidence of mitral stenosis.   5. The aortic valve has been repaired/replaced. Aortic valve  regurgitation is not visualized. No aortic stenosis is present. There is a  23 mm Inspiris valve present in the aortic position. Procedure Date:  12/06/23. Echo findings are consistent with normal   structure and function of the aortic valve prosthesis. Aortic valve mean  gradient measures 10.0 mmHg. Aortic valve Vmax measures 2.25 m/s.   6. There is mild dilatation of the ascending aorta, measuring 41 mm.   7. The inferior vena cava is normal in size with greater than 50%  respiratory variability, suggesting right atrial pressure of 3 mmHg.     Neuro/Psych negative neurological ROS  negative  psych ROS   GI/Hepatic negative GI ROS, Neg liver ROS,,,  Endo/Other  Hypothyroidism    Renal/GU negative Renal ROS  negative genitourinary   Musculoskeletal negative musculoskeletal ROS (+)    Abdominal   Peds  Hematology negative hematology ROS (+)   Anesthesia Other Findings   Reproductive/Obstetrics                              Anesthesia Physical Anesthesia Plan  ASA: 3  Anesthesia Plan: General   Post-op Pain Management:    Induction: Intravenous  PONV Risk Score and Plan: Propofol  infusion and Treatment may vary due to age or medical condition  Airway Management Planned: Natural Airway  Additional Equipment:   Intra-op Plan:   Post-operative Plan:   Informed Consent: I have reviewed the patients History and Physical, chart, labs and discussed the procedure including the risks, benefits and alternatives for the proposed anesthesia with the patient or authorized representative who has indicated his/her understanding and acceptance.     Dental advisory given  Plan Discussed with: CRNA  Anesthesia Plan Comments:          Anesthesia Quick Evaluation

## 2024-09-28 ENCOUNTER — Encounter (HOSPITAL_COMMUNITY): Admission: RE | Disposition: A | Discharge status: Home / Self Care | Payer: Self-pay | Source: Home / Self Care

## 2024-09-28 ENCOUNTER — Ambulatory Visit (HOSPITAL_COMMUNITY): Payer: Self-pay | Admitting: Anesthesiology

## 2024-09-28 ENCOUNTER — Ambulatory Visit (HOSPITAL_COMMUNITY): Admission: RE | Admit: 2024-09-28 | Discharge: 2024-09-28 | Disposition: A

## 2024-09-28 ENCOUNTER — Other Ambulatory Visit: Payer: Self-pay

## 2024-09-28 ENCOUNTER — Encounter (HOSPITAL_COMMUNITY): Payer: Self-pay | Admitting: Anesthesiology

## 2024-09-28 ENCOUNTER — Encounter (HOSPITAL_COMMUNITY): Payer: Self-pay

## 2024-09-28 DIAGNOSIS — I10 Essential (primary) hypertension: Secondary | ICD-10-CM | POA: Diagnosis not present

## 2024-09-28 DIAGNOSIS — I4819 Other persistent atrial fibrillation: Secondary | ICD-10-CM | POA: Insufficient documentation

## 2024-09-28 DIAGNOSIS — Z79899 Other long term (current) drug therapy: Secondary | ICD-10-CM | POA: Diagnosis not present

## 2024-09-28 DIAGNOSIS — Z79890 Hormone replacement therapy (postmenopausal): Secondary | ICD-10-CM | POA: Diagnosis not present

## 2024-09-28 DIAGNOSIS — I4891 Unspecified atrial fibrillation: Secondary | ICD-10-CM

## 2024-09-28 DIAGNOSIS — E039 Hypothyroidism, unspecified: Secondary | ICD-10-CM | POA: Insufficient documentation

## 2024-09-28 DIAGNOSIS — I2510 Atherosclerotic heart disease of native coronary artery without angina pectoris: Secondary | ICD-10-CM | POA: Insufficient documentation

## 2024-09-28 DIAGNOSIS — I452 Bifascicular block: Secondary | ICD-10-CM | POA: Insufficient documentation

## 2024-09-28 DIAGNOSIS — D6869 Other thrombophilia: Secondary | ICD-10-CM | POA: Insufficient documentation

## 2024-09-28 DIAGNOSIS — Z951 Presence of aortocoronary bypass graft: Secondary | ICD-10-CM | POA: Insufficient documentation

## 2024-09-28 DIAGNOSIS — Z7901 Long term (current) use of anticoagulants: Secondary | ICD-10-CM | POA: Diagnosis not present

## 2024-09-28 DIAGNOSIS — N189 Chronic kidney disease, unspecified: Secondary | ICD-10-CM | POA: Diagnosis not present

## 2024-09-28 DIAGNOSIS — E785 Hyperlipidemia, unspecified: Secondary | ICD-10-CM | POA: Diagnosis not present

## 2024-09-28 DIAGNOSIS — I129 Hypertensive chronic kidney disease with stage 1 through stage 4 chronic kidney disease, or unspecified chronic kidney disease: Secondary | ICD-10-CM | POA: Diagnosis not present

## 2024-09-28 HISTORY — PX: CARDIOVERSION: EP1203

## 2024-09-28 SURGERY — CARDIOVERSION (CATH LAB)
Anesthesia: Monitor Anesthesia Care

## 2024-09-28 MED ORDER — PROPOFOL 10 MG/ML IV BOLUS
INTRAVENOUS | Status: DC | PRN
  Administered 2024-09-28: 30 mg via INTRAVENOUS
  Administered 2024-09-28 (×2): 20 mg via INTRAVENOUS
  Administered 2024-09-28: 70 mg via INTRAVENOUS
  Administered 2024-09-28 (×2): 30 mg via INTRAVENOUS
  Administered 2024-09-28: 50 mg via INTRAVENOUS

## 2024-09-28 MED ORDER — LIDOCAINE 2% (20 MG/ML) 5 ML SYRINGE
INTRAMUSCULAR | Status: DC | PRN
  Administered 2024-09-28: 100 mg via INTRAVENOUS

## 2024-09-28 SURGICAL SUPPLY — 1 items: PAD DEFIB RADIO PHYSIO CONN (PAD) ×2 IMPLANT

## 2024-09-28 NOTE — Transfer of Care (Signed)
 Immediate Anesthesia Transfer of Care Note  Patient: [name]  Procedure(s) Performed: CARDIOVERSION  Patient Location: PACU  Anesthesia Type:General  Level of Consciousness: awake and drowsy  Airway & Oxygen Therapy: Patient Spontanous Breathing and Patient connected to nasal cannula oxygen  Post-op Assessment: Report given to RN  Post vital signs: Reviewed and stable  Last Vitals:  Vitals Value Taken Time  BP    Temp    Pulse    Resp    SpO2      Last Pain:  Vitals:   09/28/24 0657  TempSrc: Temporal         Complications: No notable events documented.

## 2024-09-28 NOTE — Discharge Instructions (Signed)

## 2024-09-28 NOTE — Interval H&P Note (Signed)
 History and Physical Interval Note:  09/28/2024 7:49 AM  [name]  has presented today for surgery, with the diagnosis of Afib.  The various methods of treatment have been discussed with the patient and family. After consideration of risks, benefits and other options for treatment, the patient has consented to  Procedure(s): CARDIOVERSION (N/A) as a surgical intervention.  The patient's history has been reviewed, patient examined, no change in status, stable for surgery.  I have reviewed the patient's chart and labs.  Questions were answered to the patient's satisfaction.     Caree Wolpert H Azobou Tonleu

## 2024-09-28 NOTE — Anesthesia Postprocedure Evaluation (Signed)
 Anesthesia Post Note  Patient: [name]  Procedure(s) Performed: CARDIOVERSION     Patient location during evaluation: Cath Lab Anesthesia Type: General Level of consciousness: awake and alert Pain management: pain level controlled Vital Signs Assessment: post-procedure vital signs reviewed and stable Respiratory status: spontaneous breathing, nonlabored ventilation, respiratory function stable and patient connected to nasal cannula oxygen Cardiovascular status: blood pressure returned to baseline and stable Postop Assessment: no apparent nausea or vomiting Anesthetic complications: no   No notable events documented.  Last Vitals:  Vitals:   09/28/24 0847 09/28/24 0857  BP: 126/71 137/70  Pulse: 80 80  Resp: 15 15  Temp:    SpO2: 98% 99%    Last Pain:  Vitals:   09/28/24 0857  TempSrc:   PainSc: 0-No pain                 Sheyanne Munley L Rubie Ficco

## 2024-09-28 NOTE — CV Procedure (Signed)
   DIRECT CURRENT CARDIOVERSION  NAME:  [name]    MRN: [id number] DOB:  10-May-1946    ADMIT DATE: 09/28/2024  Indication:  Symptomatic atrial fibrillation  Procedure Note:  The patient signed informed consent.  They have had had therapeutic anticoagulation with Eliquis  greater than 3 weeks.  Anesthesia was administered by Dr. Niels.  Adequate airway was maintained throughout and vital followed per protocol.  They were cardioverted x 1 with 200J of biphasic synchronized energy.  They converted to NSR.  There were no apparent complications.  The patient had normal neuro status and respiratory status post procedure with vitals stable as recorded elsewhere.    Follow up: They will continue on current medical therapy and follow up with cardiology as scheduled.  Joelle DEL. Ren Ny, MD, [facility]  [facility]  8:24 AM

## 2024-09-30 ENCOUNTER — Ambulatory Visit: Attending: Cardiology | Admitting: Cardiology

## 2024-09-30 ENCOUNTER — Encounter: Payer: Self-pay | Admitting: Cardiology

## 2024-09-30 VITALS — BP 154/82 | HR 87 | Ht 68.0 in | Wt 219.5 lb

## 2024-09-30 DIAGNOSIS — Z952 Presence of prosthetic heart valve: Secondary | ICD-10-CM | POA: Diagnosis not present

## 2024-09-30 DIAGNOSIS — I4819 Other persistent atrial fibrillation: Secondary | ICD-10-CM

## 2024-09-30 DIAGNOSIS — I2510 Atherosclerotic heart disease of native coronary artery without angina pectoris: Secondary | ICD-10-CM | POA: Diagnosis not present

## 2024-09-30 DIAGNOSIS — I350 Nonrheumatic aortic (valve) stenosis: Secondary | ICD-10-CM

## 2024-09-30 DIAGNOSIS — I77810 Thoracic aortic ectasia: Secondary | ICD-10-CM | POA: Diagnosis not present

## 2024-09-30 DIAGNOSIS — I483 Typical atrial flutter: Secondary | ICD-10-CM | POA: Diagnosis not present

## 2024-09-30 DIAGNOSIS — Z951 Presence of aortocoronary bypass graft: Secondary | ICD-10-CM | POA: Diagnosis not present

## 2024-09-30 DIAGNOSIS — I10 Essential (primary) hypertension: Secondary | ICD-10-CM | POA: Diagnosis not present

## 2024-09-30 NOTE — Progress Notes (Signed)
 Electrophysiology Office Note:   Date:  09/30/2024  ID:  Courtland, Reas 18-Sep-1946, MRN [id number]  Primary Cardiologist: Wilbert Bihari, MD Primary Heart Failure: None Electrophysiologist: None      History of Present Illness:   [name] is a 78 y.o. male with h/o coronary disease post CABG, AVR February 2025, atrial fibrillation/flutter with postop tamponade post pericardial window, hypertension, hyperlipidemia, CKD, bifascicular heart block seen today for  for Electrophysiology evaluation of atrial fibrillation/flutter at the request of Fairy Heinrich.    He had postoperative atrial fibrillation and flutter.  He was again seen by cardiology 07/20/2024 and was noted to be in rapid atrial flutter.  He was started on Eliquis .  He had cardioversion 08/12/2024 but has gone back into atrial flutter.  He has been started on amiodarone  and is now post repeat cardioversion 09/28/2024.  Discussed the use of AI scribe software for clinical note transcription with the patient, who gave verbal consent to proceed.  History of Present Illness [name] is a 78 year old male with atrial fibrillation and atrial flutter who presents for evaluation of treatment options. He is accompanied by his wife.  He has undergone two cardioversions, with the first lasting nine days and the second performed recently. He is currently on amiodarone , which he started after the first cardioversion. Initially, he was taking it twice a day but has recently reduced the dose to once a day.  He did not notice significant symptoms while in atrial fibrillation, except for feeling tired eventually. He monitors his heart rhythm using a watch, which indicated atrial fibrillation 23% of the time, increasing to 71% before the recent cardioversion.  No significant symptoms such as increased fatigue or shortness of breath while in atrial fibrillation, although he eventually felt tired.    Review of  systems complete and found to be negative unless listed in HPI.   EP Information / Studies Reviewed:    EKG is ordered today. Personal review as below.  EKG Interpretation Date/Time:  Wednesday September 30 2024 11:23:23 EST Ventricular Rate:  87 PR Interval:  254 QRS Duration:  154 QT Interval:  428 QTC Calculation: 515 R Axis:   98  Text Interpretation: Sinus rhythm with 1st degree A-V block Rightward axis Non-specific intra-ventricular conduction block Minimal voltage criteria for LVH, may be normal variant ( Cornell product ) When compared with ECG of 28-Sep-2024 08:26, No significant change since last tracing Confirmed by Taylyn Brame ([id number]) on 09/30/2024 11:29:01 AM   Risk Assessment/Calculations:    CHA2DS2-VASc Score = 4   This indicates a 4.8% annual risk of stroke. The patient's score is based upon: CHF History: 0 HTN History: 1 Diabetes History: 0 Stroke History: 0 Vascular Disease History: 1 Age Score: 2 Gender Score: 0            Physical Exam:   VS:  BP (!) 154/82 (BP Location: Left Arm, Patient Position: Sitting, Cuff Size: Normal)   Pulse 87   Ht 5' 8 (1.727 m)   Wt 219 lb 8 oz (99.6 kg)   SpO2 98%   BMI 33.37 kg/m    Wt Readings from Last 3 Encounters:  09/30/24 219 lb 8 oz (99.6 kg)  09/15/24 220 lb 9.6 oz (100.1 kg)  08/26/24 218 lb 6.4 oz (99.1 kg)     GEN: Well nourished, well developed in no acute distress NECK: No JVD; No carotid bruits CARDIAC: Regular rate and rhythm, n2/6 systolic murmur at the base  RESPIRATORY:  Clear to auscultation without rales, wheezing or rhonchi  ABDOMEN: Soft, non-tender, non-distended EXTREMITIES:  No edema; No deformity   ASSESSMENT AND PLAN:    1.  Persistent atrial fibrillation/flutter: On amiodarone .  Post cardioversion x 2.  Has had multiple episodes of atrial fibrillation and atrial flutter.  At this point, he is happy being on amiodarone .  We did discuss the options of ablation, but for now he would  like to avoid this.  Fatoumata Albaugh continue with amiodarone .  He Kallan Merrick follow-up in A-fib clinic.  2.  Secondary hypercoagulable state: Continue Eliquis   3.  Aortic stenosis: Post AVR.  Stable on most recent echo.  Plan per primary cardiology.  4.  Coronary artery disease: Post CABG x 2.  No current chest pain.  Plan per primary cardiology.  5.  Hypertension: Mildly elevated.  Plan per primary cardiology and primary physician  6.  Left bundle branch block: Ejection fraction normal.  Elira Colasanti continue monitoring.  Follow up with Afib Clinic in 6 months  Signed, Yoceline Bazar Gladis Norton, MD

## 2024-09-30 NOTE — Patient Instructions (Signed)
 Medication Instructions:  Your physician recommends that you continue on your current medications as directed. Please refer to the Current Medication list given to you today.  *If you need a refill on your cardiac medications before your next appointment, please call your pharmacy*  Lab Work: None ordered  Testing/Procedures: None ordered  Follow-Up: At [facility], you and your health needs are our priority.  As part of our continuing mission to provide you with exceptional heart care, our providers are all part of one team.  This team includes your primary Cardiologist (physician) and Advanced Practice Providers or APPs (Physician Assistants and Nurse Practitioners) who all work together to provide you with the care you need, when you need it.  Your next appointment:   6 month(s)  Provider:   You will follow up in the Atrial Fibrillation Clinic located at [facility]. Your provider will be: Clint R. Fenton, PA-C or Minnie Amber, PA-C     Thank you for choosing [employer]!!   Reece Cane, RN [phone]

## 2024-10-08 ENCOUNTER — Telehealth: Payer: Self-pay

## 2024-10-08 DIAGNOSIS — I4891 Unspecified atrial fibrillation: Secondary | ICD-10-CM

## 2024-10-08 NOTE — Progress Notes (Signed)
 Complex Care Management Note  Care Guide Note 10/08/2024 Name: [name] MRN: [id number] DOB: [birth year]  [name] is a 78 y.o. year old male who sees Tower, Laine LABOR, MD for primary care. I reached out to Reyes Lydia Colder by phone today to offer complex care management services.  [name] was given information about Complex Care Management services today including:   The Complex Care Management services include support from the care team which includes your Nurse Care Manager, Clinical Social Worker, or Pharmacist.  The Complex Care Management team is here to help remove barriers to the health concerns and goals most important to you. Complex Care Management services are voluntary, and the patient may decline or stop services at any time by request to their care team member.   Complex Care Management Consent Status: Patient did not agree to participate in complex care management services at this time.  Follow up plan:  Patient will follow up with PCP  Encounter Outcome:  Patient Refused  Dreama [facility]wood [facility]  [facility], [facility] VBCI Assistant Direct Dial: [phone]  Fax: [phone]

## 2024-11-15 NOTE — Progress Notes (Unsigned)
" ° ° ° °  Kohl Polinsky T. Renly Guedes, MD, CAQ Sports Medicine [facility] [address] [place], [address]  Phone: [phone]  FAX: [phone]  [name] - 79 y.o. male  MRN [id number]  Date of Birth: Oct 08, 1946  Date: 11/16/2024  PCP: Randeen Laine LABOR, MD  Referral: Randeen Laine LABOR, MD  No chief complaint on file.  Subjective:   [name] is a 79 y.o. very pleasant male patient with There is no height or weight on file to calculate BMI. who presents with the following:  Discussed the use of AI scribe software for clinical note transcription with the patient, who gave verbal consent to proceed.  Patient presents for follow-up left-sided knee osteoarthritis, I last injected his knee in July 2025. History of Present Illness     Review of Systems is noted in the HPI, as appropriate  Objective:   There were no vitals taken for this visit.  GEN: No acute distress; alert,appropriate. PULM: Breathing comfortably in no respiratory distress PSYCH: Normally interactive.   Laboratory and Imaging Data:  Assessment and Plan:   No diagnosis found. Assessment & Plan   Medication Management during today's office visit: No orders of the defined types were placed in this encounter.  There are no discontinued medications.  Orders placed today for conditions managed today: No orders of the defined types were placed in this encounter.   Disposition: No follow-ups on file.  [employer] One speech-to-text software was used for transcription in this dictation.  Possible transcriptional errors can occur using Animal nutritionist.   Signed,  Jacques DASEN. Imani Fiebelkorn, MD   Outpatient Encounter Medications as of 11/16/2024  Medication Sig   amiodarone  (PACERONE ) 200 MG tablet TAKE 1 TABLET (200 MG TOTAL) BY MOUTH 2 (TWO) TIMES DAILY FOR 30 DAYS, THEN 1 TABLET (200 MG TOTAL) DAILY.   amLODipine  (NORVASC ) 5 MG tablet Take 1 tablet (5  mg total) by mouth daily.   apixaban  (ELIQUIS ) 5 MG TABS tablet Take 1 tablet (5 mg total) by mouth 2 (two) times daily.   diclofenac  Sodium (VOLTAREN ) 1 % GEL Apply 2 g topically daily as needed (pain).   doxylamine, Sleep, (UNISOM) 25 MG tablet Take 25 mg by mouth at bedtime.   ezetimibe  (ZETIA ) 10 MG tablet Take 1 tablet (10 mg total) by mouth daily.   levothyroxine  (SYNTHROID ) 75 MCG tablet Take 1 tablet (75 mcg total) by mouth daily.   losartan  (COZAAR ) 25 MG tablet Take 1 tablet (25 mg total) by mouth daily.   metoprolol  tartrate (LOPRESSOR ) 25 MG tablet Take 1 tablet (25 mg total) by mouth 2 (two) times daily.   Multiple Vitamin (MULTIVITAMIN WITH MINERALS) TABS tablet Take 1 tablet by mouth in the morning. Centrum Silver   rosuvastatin  (CRESTOR ) 40 MG tablet Take 1 tablet (40 mg total) by mouth daily.   No facility-administered encounter medications on file as of 11/16/2024.   "

## 2024-11-16 ENCOUNTER — Encounter: Payer: Self-pay | Admitting: Family Medicine

## 2024-11-16 ENCOUNTER — Ambulatory Visit (INDEPENDENT_AMBULATORY_CARE_PROVIDER_SITE_OTHER): Admitting: Family Medicine

## 2024-11-16 VITALS — BP 148/68 | HR 93 | Temp 97.1°F | Ht 67.5 in | Wt 223.4 lb

## 2024-11-16 DIAGNOSIS — M1712 Unilateral primary osteoarthritis, left knee: Secondary | ICD-10-CM

## 2024-11-16 MED ORDER — TRIAMCINOLONE ACETONIDE 40 MG/ML IJ SUSP
40.0000 mg | Freq: Once | INTRAMUSCULAR | Status: AC
  Administered 2024-11-16: 40 mg via INTRA_ARTICULAR

## 2024-11-30 ENCOUNTER — Ambulatory Visit: Payer: Self-pay

## 2024-11-30 ENCOUNTER — Encounter: Payer: Self-pay | Admitting: Family Medicine

## 2024-11-30 NOTE — Telephone Encounter (Signed)
 FYI Only or Action Required?: FYI only for provider: appointment scheduled on 2/3.  Patient was last seen in primary care on 11/16/2024 by Watt Mirza, MD.  Called Nurse Triage reporting Wheezing.  Symptoms began yesterday.  Interventions attempted: OTC medications: tylenol  and Rest, hydration, or home remedies.  Symptoms are: gradually worsening.  Triage Disposition: See HCP Within 4 Hours (Or PCP Triage)  Patient/caregiver understands and will follow disposition?: Yes  Message from [facility] sent at 11/30/2024  2:55 PM EST  Summary: Ratting in chest upon exhale   Reason for Triage: Patient states he is getting over a cold and hears a rattling in his chest when he exhales. States he has no SOB or chest pains, but has low-grade fever (101) and runny nose. No vomiting or diarrhea noted.         Reason for Disposition  [1] Fever > 101 F (38.3 C) AND [2] age > 60 years  Answer Assessment - Initial Assessment Questions Patient with mild cough, sinus congestion, runny nose, fever (tmax 101).  Reports chest rattling on exhale- denies CP, SOB, dizziness.   Tylenol  for fever Appt with pcp tomorrow 2/3 to assess. Patient aware ED/UC precautions   1. ONSET: When did the nasal discharge start?      2 days ago  2. AMOUNT: How much discharge is there?      Clear congestion  3. COUGH: Do you have a cough? If Yes, ask: Describe the color of your mucus. (e.g., clear, white, yellow, green)     Mild cough  4. RESPIRATORY DISTRESS: Describe your breathing.      Rattling on exhale  5. FEVER: Do you have a fever? If Yes, ask: What is your temperature, how was it measured, and when did it start?     101 6. SEVERITY: Overall, how bad are you feeling right now? (e.g., doesn't interfere with normal activities, staying home from school/work, staying in bed)      deneis 7. OTHER SYMPTOMS: Do you have any other symptoms? (e.g., earache, mouth sores, sore throat, wheezing)      Rattling when exhaling  Protocols used: Common Cold-A-AH

## 2024-12-01 ENCOUNTER — Encounter: Payer: Self-pay | Admitting: Family Medicine

## 2024-12-01 ENCOUNTER — Ambulatory Visit: Admitting: Family Medicine

## 2024-12-01 VITALS — BP 121/60 | HR 81 | Temp 98.9°F | Ht 67.5 in | Wt 211.1 lb

## 2024-12-01 DIAGNOSIS — I4892 Unspecified atrial flutter: Secondary | ICD-10-CM

## 2024-12-01 DIAGNOSIS — N184 Chronic kidney disease, stage 4 (severe): Secondary | ICD-10-CM | POA: Diagnosis not present

## 2024-12-01 DIAGNOSIS — I4891 Unspecified atrial fibrillation: Secondary | ICD-10-CM | POA: Diagnosis not present

## 2024-12-01 DIAGNOSIS — R051 Acute cough: Secondary | ICD-10-CM | POA: Diagnosis not present

## 2024-12-01 DIAGNOSIS — J209 Acute bronchitis, unspecified: Secondary | ICD-10-CM | POA: Insufficient documentation

## 2024-12-01 LAB — POCT INFLUENZA A/B
Influenza A, POC: NEGATIVE
Influenza B, POC: NEGATIVE

## 2024-12-01 LAB — POC COVID19 BINAXNOW: SARS Coronavirus 2 Ag: NEGATIVE

## 2024-12-01 MED ORDER — PREDNISONE 10 MG PO TABS: ORAL_TABLET | ORAL | 0 refills | Status: AC

## 2024-12-01 MED ORDER — BENZONATATE 200 MG PO CAPS: 200.0000 mg | ORAL_CAPSULE | Freq: Three times a day (TID) | ORAL | 0 refills | Status: AC | PRN

## 2024-12-01 MED ORDER — DOXYCYCLINE HYCLATE 100 MG PO TABS: 100.0000 mg | ORAL_TABLET | Freq: Two times a day (BID) | ORAL | 0 refills | Status: AC

## 2024-12-01 NOTE — Patient Instructions (Addendum)
 Drink fluids and rest  mucinex DM is good for cough and congestion  Nasal saline for congestion as needed  Tylenol  for fever or pain or headache (no aleve or ibuprofen given the eliquis )   Prednisone  for bronchial swelling /tightness and also nasal congestion  Doxycycline -for possible bacterial infection  Please alert us  if symptoms worsen (if severe or short of breath please go to the ER)   Update if not starting to improve in a week or if worsening

## 2024-12-01 NOTE — Assessment & Plan Note (Addendum)
 Taking eliquis  for stroke prevention   Reminded not to take nsaid with this (was taking aleve) due to bleeding risk Pt voiced understanding   Per last cardiology note  1. Persistent atrial fibrillation/flutter: On amiodarone . Post cardioversion x 2. Has had multiple episodes of atrial fibrillation and atrial flutter. At this point, he is happy being on amiodarone . We did discuss the options of ablation, but for now he would like to avoid this. Will continue with amiodarone . He will follow-up in A-fib clinic.

## 2024-12-01 NOTE — Telephone Encounter (Signed)
 Will see patient then Agree with ER and UC precautions

## 2024-12-01 NOTE — Assessment & Plan Note (Signed)
 Taking eliquis  for stroke prevention   Reminded not to take nsaid with this (was taking aleve) due to bleeding risk Pt voiced understanding

## 2025-03-24 ENCOUNTER — Other Ambulatory Visit

## 2025-03-31 ENCOUNTER — Encounter: Admitting: Family Medicine

## 2025-03-31 ENCOUNTER — Ambulatory Visit (HOSPITAL_COMMUNITY): Admitting: Internal Medicine

## 2025-04-29 ENCOUNTER — Ambulatory Visit: Admitting: Family Medicine
